# Patient Record
Sex: Male | Born: 1935 | Race: White | Hispanic: No | Smoking: Current every day smoker
Health system: Southern US, Community
[De-identification: ages and names within clinical notes are randomized; demographics above are authoritative.]

## PROBLEM LIST (undated history)

## (undated) DIAGNOSIS — F0150 Vascular dementia without behavioral disturbance: Secondary | ICD-10-CM

## (undated) DIAGNOSIS — C329 Malignant neoplasm of larynx, unspecified: Secondary | ICD-10-CM

## (undated) DIAGNOSIS — I739 Peripheral vascular disease, unspecified: Secondary | ICD-10-CM

## (undated) DIAGNOSIS — Z862 Personal history of diseases of the blood and blood-forming organs and certain disorders involving the immune mechanism: Secondary | ICD-10-CM

## (undated) DIAGNOSIS — I4891 Unspecified atrial fibrillation: Secondary | ICD-10-CM

## (undated) DIAGNOSIS — E785 Hyperlipidemia, unspecified: Secondary | ICD-10-CM

## (undated) DIAGNOSIS — I6529 Occlusion and stenosis of unspecified carotid artery: Secondary | ICD-10-CM

## (undated) DIAGNOSIS — J449 Chronic obstructive pulmonary disease, unspecified: Secondary | ICD-10-CM

## (undated) DIAGNOSIS — W009XXA Unspecified fall due to ice and snow, initial encounter: Secondary | ICD-10-CM

## (undated) DIAGNOSIS — M8580 Other specified disorders of bone density and structure, unspecified site: Secondary | ICD-10-CM

## (undated) DIAGNOSIS — Z860100 Personal history of colon polyps, unspecified: Secondary | ICD-10-CM

## (undated) DIAGNOSIS — F0390 Unspecified dementia without behavioral disturbance: Secondary | ICD-10-CM

## (undated) DIAGNOSIS — I482 Chronic atrial fibrillation, unspecified: Secondary | ICD-10-CM

## (undated) DIAGNOSIS — C320 Malignant neoplasm of glottis: Secondary | ICD-10-CM

## (undated) DIAGNOSIS — I639 Cerebral infarction, unspecified: Secondary | ICD-10-CM

## (undated) DIAGNOSIS — F17200 Nicotine dependence, unspecified, uncomplicated: Secondary | ICD-10-CM

## (undated) HISTORY — DX: Personal history of diseases of the blood and blood-forming organs and certain disorders involving the immune mechanism: Z86.2

## (undated) HISTORY — DX: Personal history of colonic polyps: Z86.010

## (undated) HISTORY — PX: OTHER SURGICAL HISTORY: SHX169

## (undated) HISTORY — DX: Hyperlipidemia, unspecified: E78.5

## (undated) HISTORY — DX: Occlusion and stenosis of unspecified carotid artery: I65.29

## (undated) HISTORY — PX: HEMORRHOID SURGERY: SHX153

## (undated) HISTORY — PX: TONSILLECTOMY AND ADENOIDECTOMY: SUR1326

## (undated) HISTORY — DX: Chronic atrial fibrillation, unspecified: I48.20

## (undated) HISTORY — DX: Chronic obstructive pulmonary disease, unspecified: J44.9

## (undated) HISTORY — PX: CATARACT EXTRACTION: SUR2

## (undated) HISTORY — DX: Unspecified dementia, unspecified severity, without behavioral disturbance, psychotic disturbance, mood disturbance, and anxiety: F03.90

## (undated) HISTORY — PX: SPLENECTOMY: SUR1306

## (undated) HISTORY — DX: Nicotine dependence, unspecified, uncomplicated: F17.200

## (undated) HISTORY — DX: Cerebral infarction, unspecified: I63.9

## (undated) HISTORY — DX: Personal history of colon polyps, unspecified: Z86.0100

## (undated) HISTORY — DX: Other specified disorders of bone density and structure, unspecified site: M85.80

## (undated) HISTORY — DX: Unspecified fall due to ice and snow, initial encounter: W00.9XXA

## (undated) HISTORY — DX: Malignant neoplasm of glottis: C32.0

---

## 1999-02-19 ENCOUNTER — Ambulatory Visit (HOSPITAL_COMMUNITY): Admission: RE | Admit: 1999-02-19 | Discharge: 1999-02-19 | Payer: Self-pay | Admitting: Gastroenterology

## 2002-12-17 ENCOUNTER — Encounter: Payer: Self-pay | Admitting: Family Medicine

## 2003-01-06 ENCOUNTER — Encounter (HOSPITAL_COMMUNITY): Payer: Self-pay | Admitting: Oncology

## 2003-01-06 ENCOUNTER — Ambulatory Visit (HOSPITAL_COMMUNITY): Admission: RE | Admit: 2003-01-06 | Discharge: 2003-01-06 | Payer: Self-pay | Admitting: Oncology

## 2003-04-11 ENCOUNTER — Encounter (INDEPENDENT_AMBULATORY_CARE_PROVIDER_SITE_OTHER): Payer: Self-pay | Admitting: Specialist

## 2003-04-11 ENCOUNTER — Ambulatory Visit (HOSPITAL_COMMUNITY): Admission: RE | Admit: 2003-04-11 | Discharge: 2003-04-11 | Payer: Self-pay | Admitting: Oncology

## 2003-05-31 HISTORY — PX: BONE MARROW BIOPSY: SHX199

## 2003-08-28 ENCOUNTER — Ambulatory Visit (HOSPITAL_COMMUNITY): Admission: RE | Admit: 2003-08-28 | Discharge: 2003-08-28 | Payer: Self-pay | Admitting: Oncology

## 2003-11-10 ENCOUNTER — Inpatient Hospital Stay (HOSPITAL_COMMUNITY): Admission: RE | Admit: 2003-11-10 | Discharge: 2003-11-12 | Payer: Self-pay | Admitting: General Surgery

## 2003-11-10 ENCOUNTER — Encounter (INDEPENDENT_AMBULATORY_CARE_PROVIDER_SITE_OTHER): Payer: Self-pay | Admitting: Specialist

## 2004-04-06 ENCOUNTER — Ambulatory Visit: Payer: Self-pay | Admitting: Family Medicine

## 2004-04-11 ENCOUNTER — Ambulatory Visit: Payer: Self-pay | Admitting: Oncology

## 2004-04-14 ENCOUNTER — Ambulatory Visit: Payer: Self-pay | Admitting: Internal Medicine

## 2004-06-08 ENCOUNTER — Ambulatory Visit: Payer: Self-pay | Admitting: Oncology

## 2004-06-30 LAB — HM DEXA SCAN

## 2004-07-09 ENCOUNTER — Encounter: Admission: RE | Admit: 2004-07-09 | Discharge: 2004-07-09 | Payer: Self-pay | Admitting: Oncology

## 2004-08-02 ENCOUNTER — Ambulatory Visit: Payer: Self-pay | Admitting: Oncology

## 2004-09-27 ENCOUNTER — Ambulatory Visit: Payer: Self-pay | Admitting: Oncology

## 2004-12-06 ENCOUNTER — Ambulatory Visit: Payer: Self-pay | Admitting: Oncology

## 2005-01-28 ENCOUNTER — Ambulatory Visit: Payer: Self-pay | Admitting: Oncology

## 2005-03-30 ENCOUNTER — Ambulatory Visit: Payer: Self-pay | Admitting: Family Medicine

## 2005-04-04 ENCOUNTER — Ambulatory Visit: Payer: Self-pay | Admitting: Oncology

## 2005-05-13 ENCOUNTER — Ambulatory Visit: Payer: Self-pay | Admitting: Internal Medicine

## 2005-06-01 ENCOUNTER — Ambulatory Visit: Payer: Self-pay | Admitting: Oncology

## 2005-07-28 ENCOUNTER — Ambulatory Visit: Payer: Self-pay | Admitting: Oncology

## 2005-09-21 ENCOUNTER — Ambulatory Visit: Payer: Self-pay | Admitting: Oncology

## 2005-09-29 ENCOUNTER — Ambulatory Visit: Payer: Self-pay | Admitting: Oncology

## 2005-09-29 LAB — CBC WITH DIFFERENTIAL/PLATELET
Basophils Absolute: 0.1 10*3/uL (ref 0.0–0.1)
Eosinophils Absolute: 0.1 10*3/uL (ref 0.0–0.5)
HGB: 14 g/dL (ref 13.0–17.1)
MCV: 93.4 fL (ref 81.6–98.0)
MONO#: 1.1 10*3/uL — ABNORMAL HIGH (ref 0.1–0.9)
MONO%: 9.7 % (ref 0.0–13.0)
NEUT#: 6.1 10*3/uL (ref 1.5–6.5)
Platelets: 196 10*3/uL (ref 145–400)
RDW: 13.9 % (ref 11.2–14.6)
WBC: 11 10*3/uL — ABNORMAL HIGH (ref 4.0–10.0)

## 2005-09-29 LAB — COMPREHENSIVE METABOLIC PANEL
Albumin: 3.8 g/dL (ref 3.5–5.2)
Alkaline Phosphatase: 59 U/L (ref 39–117)
BUN: 22 mg/dL (ref 6–23)
CO2: 24 mEq/L (ref 19–32)
Calcium: 9 mg/dL (ref 8.4–10.5)
Glucose, Bld: 103 mg/dL — ABNORMAL HIGH (ref 70–99)
Potassium: 3.9 mEq/L (ref 3.5–5.3)
Total Protein: 6.9 g/dL (ref 6.0–8.3)

## 2005-09-29 LAB — LACTATE DEHYDROGENASE: LDH: 111 U/L (ref 94–250)

## 2005-10-28 LAB — CBC WITH DIFFERENTIAL/PLATELET
Basophils Absolute: 0 10*3/uL (ref 0.0–0.1)
Eosinophils Absolute: 0 10*3/uL (ref 0.0–0.5)
HGB: 13.6 g/dL (ref 13.0–17.1)
NEUT#: 10.7 10*3/uL — ABNORMAL HIGH (ref 1.5–6.5)
RDW: 14 % (ref 11.2–14.6)
lymph#: 1.5 10*3/uL (ref 0.9–3.3)

## 2005-11-21 ENCOUNTER — Ambulatory Visit: Payer: Self-pay | Admitting: Oncology

## 2005-11-24 LAB — CBC WITH DIFFERENTIAL/PLATELET
Basophils Absolute: 0 10*3/uL (ref 0.0–0.1)
Eosinophils Absolute: 0 10*3/uL (ref 0.0–0.5)
HGB: 13.8 g/dL (ref 13.0–17.1)
LYMPH%: 16.6 % (ref 14.0–48.0)
MCV: 94.5 fL (ref 81.6–98.0)
MONO%: 5.9 % (ref 0.0–13.0)
NEUT#: 7.6 10*3/uL — ABNORMAL HIGH (ref 1.5–6.5)
Platelets: 207 10*3/uL (ref 145–400)
RDW: 13.8 % (ref 11.2–14.6)

## 2005-12-22 LAB — CBC WITH DIFFERENTIAL/PLATELET
Basophils Absolute: 0 10*3/uL (ref 0.0–0.1)
EOS%: 0.6 % (ref 0.0–7.0)
HCT: 42.7 % (ref 38.7–49.9)
HGB: 14.5 g/dL (ref 13.0–17.1)
MCH: 31.7 pg (ref 28.0–33.4)
MONO#: 0.6 10*3/uL (ref 0.1–0.9)
NEUT%: 73 % (ref 40.0–75.0)
Platelets: 70 10*3/uL — ABNORMAL LOW (ref 145–400)
lymph#: 1.7 10*3/uL (ref 0.9–3.3)

## 2005-12-22 LAB — COMPREHENSIVE METABOLIC PANEL
BUN: 13 mg/dL (ref 6–23)
CO2: 25 mEq/L (ref 19–32)
Calcium: 9.3 mg/dL (ref 8.4–10.5)
Chloride: 105 mEq/L (ref 96–112)
Creatinine, Ser: 0.76 mg/dL (ref 0.40–1.50)

## 2005-12-22 LAB — LACTATE DEHYDROGENASE: LDH: 124 U/L (ref 94–250)

## 2005-12-29 LAB — CBC WITH DIFFERENTIAL/PLATELET
BASO%: 0.2 % (ref 0.0–2.0)
EOS%: 0.1 % (ref 0.0–7.0)
Eosinophils Absolute: 0 10*3/uL (ref 0.0–0.5)
LYMPH%: 14 % (ref 14.0–48.0)
MCH: 31.9 pg (ref 28.0–33.4)
MCHC: 33.8 g/dL (ref 32.0–35.9)
MCV: 94.4 fL (ref 81.6–98.0)
MONO%: 5.3 % (ref 0.0–13.0)
Platelets: 302 10*3/uL (ref 145–400)
RBC: 4.23 10*6/uL (ref 4.20–5.71)

## 2006-01-13 ENCOUNTER — Ambulatory Visit: Payer: Self-pay | Admitting: Oncology

## 2006-01-19 LAB — CBC WITH DIFFERENTIAL/PLATELET
BASO%: 0.3 % (ref 0.0–2.0)
LYMPH%: 10.5 % — ABNORMAL LOW (ref 14.0–48.0)
MCHC: 34 g/dL (ref 32.0–35.9)
MONO#: 0.7 10*3/uL (ref 0.1–0.9)
Platelets: 300 10*3/uL (ref 145–400)
RBC: 3.89 10*6/uL — ABNORMAL LOW (ref 4.20–5.71)
RDW: 13.3 % (ref 11.2–14.6)
WBC: 11.7 10*3/uL — ABNORMAL HIGH (ref 4.0–10.0)

## 2006-01-19 LAB — COMPREHENSIVE METABOLIC PANEL
ALT: 10 U/L (ref 0–40)
Alkaline Phosphatase: 57 U/L (ref 39–117)
CO2: 25 mEq/L (ref 19–32)
Sodium: 139 mEq/L (ref 135–145)
Total Bilirubin: 0.5 mg/dL (ref 0.3–1.2)
Total Protein: 6.8 g/dL (ref 6.0–8.3)

## 2006-02-16 LAB — CBC WITH DIFFERENTIAL/PLATELET
BASO%: 0.6 % (ref 0.0–2.0)
EOS%: 1.2 % (ref 0.0–7.0)
HCT: 40.7 % (ref 38.7–49.9)
LYMPH%: 24.3 % (ref 14.0–48.0)
MCH: 31.5 pg (ref 28.0–33.4)
MCHC: 33.5 g/dL (ref 32.0–35.9)
MONO%: 8.5 % (ref 0.0–13.0)
NEUT%: 65.4 % (ref 40.0–75.0)
Platelets: 218 10*3/uL (ref 145–400)

## 2006-03-14 ENCOUNTER — Ambulatory Visit: Payer: Self-pay | Admitting: Oncology

## 2006-03-16 LAB — CBC WITH DIFFERENTIAL/PLATELET
Basophils Absolute: 0 10*3/uL (ref 0.0–0.1)
EOS%: 1.7 % (ref 0.0–7.0)
Eosinophils Absolute: 0.2 10*3/uL (ref 0.0–0.5)
HGB: 13 g/dL (ref 13.0–17.1)
LYMPH%: 26.7 % (ref 14.0–48.0)
MCH: 30.9 pg (ref 28.0–33.4)
MCV: 93.3 fL (ref 81.6–98.0)
MONO%: 8 % (ref 0.0–13.0)
NEUT#: 6.4 10*3/uL (ref 1.5–6.5)
NEUT%: 63.2 % (ref 40.0–75.0)
Platelets: 347 10*3/uL (ref 145–400)

## 2006-04-13 LAB — CBC WITH DIFFERENTIAL/PLATELET
BASO%: 0.5 % (ref 0.0–2.0)
Basophils Absolute: 0.1 10*3/uL (ref 0.0–0.1)
EOS%: 4.3 % (ref 0.0–7.0)
HCT: 41.3 % (ref 38.7–49.9)
LYMPH%: 32 % (ref 14.0–48.0)
MCH: 31 pg (ref 28.0–33.4)
MCHC: 33.5 g/dL (ref 32.0–35.9)
MCV: 92.4 fL (ref 81.6–98.0)
MONO%: 7.3 % (ref 0.0–13.0)
NEUT%: 55.9 % (ref 40.0–75.0)
Platelets: 200 10*3/uL (ref 145–400)

## 2006-04-19 ENCOUNTER — Ambulatory Visit: Payer: Self-pay | Admitting: Family Medicine

## 2006-05-09 ENCOUNTER — Ambulatory Visit: Payer: Self-pay | Admitting: Oncology

## 2006-05-11 LAB — CBC WITH DIFFERENTIAL/PLATELET
EOS%: 8.9 % — ABNORMAL HIGH (ref 0.0–7.0)
Eosinophils Absolute: 0.8 10*3/uL — ABNORMAL HIGH (ref 0.0–0.5)
HCT: 40.4 % (ref 38.7–49.9)
HGB: 13.5 g/dL (ref 13.0–17.1)
MCHC: 33.5 g/dL (ref 32.0–35.9)
MONO#: 1 10*3/uL — ABNORMAL HIGH (ref 0.1–0.9)
MONO%: 10.8 % (ref 0.0–13.0)
Platelets: 25 10*3/uL — ABNORMAL LOW (ref 145–400)
WBC: 9.4 10*3/uL (ref 4.0–10.0)
lymph#: 3 10*3/uL (ref 0.9–3.3)

## 2006-05-12 LAB — CBC WITH DIFFERENTIAL/PLATELET
Eosinophils Absolute: 1 10*3/uL — ABNORMAL HIGH (ref 0.0–0.5)
MONO#: 1 10*3/uL — ABNORMAL HIGH (ref 0.1–0.9)
MONO%: 9.5 % (ref 0.0–13.0)
NEUT#: 5 10*3/uL (ref 1.5–6.5)
RBC: 4.34 10*6/uL (ref 4.20–5.71)
RDW: 14.6 % (ref 11.2–14.6)
WBC: 10.6 10*3/uL — ABNORMAL HIGH (ref 4.0–10.0)

## 2006-05-17 LAB — CBC WITH DIFFERENTIAL/PLATELET
Eosinophils Absolute: 0.4 10*3/uL (ref 0.0–0.5)
HCT: 41.1 % (ref 38.7–49.9)
LYMPH%: 33.7 % (ref 14.0–48.0)
MONO#: 0.9 10*3/uL (ref 0.1–0.9)
NEUT#: 5.8 10*3/uL (ref 1.5–6.5)
Platelets: 226 10*3/uL (ref 145–400)
RBC: 4.42 10*6/uL (ref 4.20–5.71)
WBC: 10.9 10*3/uL — ABNORMAL HIGH (ref 4.0–10.0)

## 2006-05-24 LAB — CBC WITH DIFFERENTIAL/PLATELET
BASO%: 0.5 % (ref 0.0–2.0)
Basophils Absolute: 0.1 10*3/uL (ref 0.0–0.1)
Eosinophils Absolute: 0.7 10*3/uL — ABNORMAL HIGH (ref 0.0–0.5)
HCT: 40.5 % (ref 38.7–49.9)
HGB: 13.4 g/dL (ref 13.0–17.1)
LYMPH%: 33.3 % (ref 14.0–48.0)
MCHC: 33.2 g/dL (ref 32.0–35.9)
MONO#: 1.1 10*3/uL — ABNORMAL HIGH (ref 0.1–0.9)
NEUT%: 50.8 % (ref 40.0–75.0)
Platelets: 191 10*3/uL (ref 145–400)
WBC: 11.8 10*3/uL — ABNORMAL HIGH (ref 4.0–10.0)

## 2006-05-30 DIAGNOSIS — C320 Malignant neoplasm of glottis: Secondary | ICD-10-CM | POA: Diagnosis present

## 2006-05-30 HISTORY — DX: Malignant neoplasm of glottis: C32.0

## 2006-06-08 LAB — CBC WITH DIFFERENTIAL/PLATELET
BASO%: 0.7 % (ref 0.0–2.0)
Eosinophils Absolute: 0.3 10*3/uL (ref 0.0–0.5)
MCHC: 32 g/dL (ref 32.0–35.9)
MONO#: 1 10*3/uL — ABNORMAL HIGH (ref 0.1–0.9)
NEUT#: 5.2 10*3/uL (ref 1.5–6.5)
Platelets: 179 10*3/uL (ref 145–400)
RBC: 4.49 10*6/uL (ref 4.20–5.71)
RDW: 14.4 % (ref 11.2–14.6)
WBC: 10.1 10*3/uL — ABNORMAL HIGH (ref 4.0–10.0)
lymph#: 3.5 10*3/uL — ABNORMAL HIGH (ref 0.9–3.3)

## 2006-07-04 ENCOUNTER — Ambulatory Visit: Payer: Self-pay | Admitting: Oncology

## 2006-07-06 LAB — CBC WITH DIFFERENTIAL/PLATELET
BASO%: 0.6 % (ref 0.0–2.0)
Eosinophils Absolute: 0.3 10*3/uL (ref 0.0–0.5)
HCT: 40.5 % (ref 38.7–49.9)
HGB: 13.8 g/dL (ref 13.0–17.1)
LYMPH%: 37.7 % (ref 14.0–48.0)
MCHC: 34.2 g/dL (ref 32.0–35.9)
MONO#: 1.2 10*3/uL — ABNORMAL HIGH (ref 0.1–0.9)
NEUT#: 5.2 10*3/uL (ref 1.5–6.5)
NEUT%: 48 % (ref 40.0–75.0)
Platelets: 143 10*3/uL — ABNORMAL LOW (ref 145–400)
WBC: 10.9 10*3/uL — ABNORMAL HIGH (ref 4.0–10.0)
lymph#: 4.1 10*3/uL — ABNORMAL HIGH (ref 0.9–3.3)

## 2006-08-03 LAB — CBC WITH DIFFERENTIAL/PLATELET
BASO%: 0.6 % (ref 0.0–2.0)
Basophils Absolute: 0.1 10*3/uL (ref 0.0–0.1)
HCT: 41.3 % (ref 38.7–49.9)
HGB: 14.2 g/dL (ref 13.0–17.1)
LYMPH%: 33.3 % (ref 14.0–48.0)
MCH: 31.4 pg (ref 28.0–33.4)
MCHC: 34.4 g/dL (ref 32.0–35.9)
MONO#: 1.2 10*3/uL — ABNORMAL HIGH (ref 0.1–0.9)
NEUT%: 52.5 % (ref 40.0–75.0)
Platelets: 130 10*3/uL — ABNORMAL LOW (ref 145–400)
WBC: 10.7 10*3/uL — ABNORMAL HIGH (ref 4.0–10.0)
lymph#: 3.6 10*3/uL — ABNORMAL HIGH (ref 0.9–3.3)

## 2006-08-15 ENCOUNTER — Ambulatory Visit: Payer: Self-pay | Admitting: Family Medicine

## 2006-08-28 ENCOUNTER — Ambulatory Visit: Payer: Self-pay | Admitting: Oncology

## 2006-08-31 LAB — CBC WITH DIFFERENTIAL/PLATELET
BASO%: 0.7 % (ref 0.0–2.0)
EOS%: 1.5 % (ref 0.0–7.0)
HCT: 39.2 % (ref 38.7–49.9)
LYMPH%: 32.2 % (ref 14.0–48.0)
MCH: 32.5 pg (ref 28.0–33.4)
MCHC: 35 g/dL (ref 32.0–35.9)
NEUT%: 57.2 % (ref 40.0–75.0)
lymph#: 4.1 10*3/uL — ABNORMAL HIGH (ref 0.9–3.3)

## 2006-09-28 ENCOUNTER — Encounter: Payer: Self-pay | Admitting: Family Medicine

## 2006-09-28 LAB — COMPREHENSIVE METABOLIC PANEL
ALT: 10 U/L (ref 0–53)
AST: 13 U/L (ref 0–37)
Albumin: 3.9 g/dL (ref 3.5–5.2)
Calcium: 9.2 mg/dL (ref 8.4–10.5)
Chloride: 105 mEq/L (ref 96–112)
Potassium: 4.4 mEq/L (ref 3.5–5.3)
Sodium: 140 mEq/L (ref 135–145)

## 2006-09-28 LAB — CBC WITH DIFFERENTIAL/PLATELET
BASO%: 1.1 % (ref 0.0–2.0)
EOS%: 1.9 % (ref 0.0–7.0)
HGB: 14.4 g/dL (ref 13.0–17.1)
MCH: 32.5 pg (ref 28.0–33.4)
MCHC: 35.2 g/dL (ref 32.0–35.9)
RDW: 14 % (ref 11.2–14.6)
lymph#: 2.9 10*3/uL (ref 0.9–3.3)

## 2006-11-06 ENCOUNTER — Encounter: Payer: Self-pay | Admitting: Family Medicine

## 2006-11-06 DIAGNOSIS — Z860100 Personal history of colon polyps, unspecified: Secondary | ICD-10-CM | POA: Insufficient documentation

## 2006-11-06 DIAGNOSIS — D696 Thrombocytopenia, unspecified: Secondary | ICD-10-CM | POA: Insufficient documentation

## 2006-11-06 DIAGNOSIS — F17200 Nicotine dependence, unspecified, uncomplicated: Secondary | ICD-10-CM | POA: Insufficient documentation

## 2006-11-06 DIAGNOSIS — J449 Chronic obstructive pulmonary disease, unspecified: Secondary | ICD-10-CM | POA: Insufficient documentation

## 2006-11-06 DIAGNOSIS — D693 Immune thrombocytopenic purpura: Secondary | ICD-10-CM | POA: Insufficient documentation

## 2006-11-06 DIAGNOSIS — E785 Hyperlipidemia, unspecified: Secondary | ICD-10-CM | POA: Insufficient documentation

## 2006-11-06 DIAGNOSIS — Z87448 Personal history of other diseases of urinary system: Secondary | ICD-10-CM | POA: Insufficient documentation

## 2006-11-08 ENCOUNTER — Ambulatory Visit: Payer: Self-pay | Admitting: Family Medicine

## 2006-11-10 ENCOUNTER — Encounter (INDEPENDENT_AMBULATORY_CARE_PROVIDER_SITE_OTHER): Payer: Self-pay | Admitting: *Deleted

## 2006-11-10 LAB — CONVERTED CEMR LAB
AST: 24 units/L (ref 0–37)
CO2: 27 meq/L (ref 19–32)
Calcium: 9.3 mg/dL (ref 8.4–10.5)
Cholesterol: 142 mg/dL (ref 0–200)
Creatinine, Ser: 0.7 mg/dL (ref 0.4–1.5)
Glucose, Bld: 79 mg/dL (ref 70–99)
HDL: 28.2 mg/dL — ABNORMAL LOW (ref >39.0)
LDL Cholesterol: 103 mg/dL — ABNORMAL HIGH (ref 0–99)
Phosphorus: 3.1 mg/dL (ref 2.3–4.6)
Potassium: 4.5 meq/L (ref 3.5–5.1)
Total CHOL/HDL Ratio: 5

## 2006-11-20 ENCOUNTER — Ambulatory Visit: Payer: Self-pay | Admitting: Oncology

## 2006-11-23 LAB — CBC WITH DIFFERENTIAL/PLATELET
Basophils Absolute: 0.1 10*3/uL (ref 0.0–0.1)
EOS%: 2 % (ref 0.0–7.0)
LYMPH%: 35.1 % (ref 14.0–48.0)
MCH: 31.9 pg (ref 28.0–33.4)
MCV: 93.5 fL (ref 81.6–98.0)
MONO%: 10.7 % (ref 0.0–13.0)
RBC: 4.42 10*6/uL (ref 4.20–5.71)
RDW: 13.5 % (ref 11.2–14.6)

## 2007-01-14 ENCOUNTER — Ambulatory Visit: Payer: Self-pay | Admitting: Oncology

## 2007-01-18 LAB — CBC WITH DIFFERENTIAL/PLATELET
Basophils Absolute: 0 10*3/uL (ref 0.0–0.1)
EOS%: 1.1 % (ref 0.0–7.0)
Eosinophils Absolute: 0.1 10*3/uL (ref 0.0–0.5)
LYMPH%: 29.9 % (ref 14.0–48.0)
MCH: 32.4 pg (ref 28.0–33.4)
MCV: 92.9 fL (ref 81.6–98.0)
MONO%: 9.6 % (ref 0.0–13.0)
NEUT%: 59.1 % (ref 40.0–75.0)
Platelets: 135 10*3/uL — ABNORMAL LOW (ref 145–400)
RDW: 13.1 % (ref 11.2–14.6)
WBC: 10.5 10*3/uL — ABNORMAL HIGH (ref 4.0–10.0)

## 2007-01-29 DIAGNOSIS — C321 Malignant neoplasm of supraglottis: Secondary | ICD-10-CM | POA: Insufficient documentation

## 2007-02-05 ENCOUNTER — Telehealth: Payer: Self-pay | Admitting: Family Medicine

## 2007-02-05 DIAGNOSIS — R498 Other voice and resonance disorders: Secondary | ICD-10-CM | POA: Insufficient documentation

## 2007-02-05 DIAGNOSIS — R490 Dysphonia: Secondary | ICD-10-CM | POA: Insufficient documentation

## 2007-02-12 ENCOUNTER — Encounter: Admission: RE | Admit: 2007-02-12 | Discharge: 2007-02-12 | Payer: Self-pay | Admitting: Otolaryngology

## 2007-02-13 ENCOUNTER — Encounter: Payer: Self-pay | Admitting: Family Medicine

## 2007-02-13 ENCOUNTER — Encounter (INDEPENDENT_AMBULATORY_CARE_PROVIDER_SITE_OTHER): Payer: Self-pay | Admitting: Otolaryngology

## 2007-02-13 ENCOUNTER — Encounter (INDEPENDENT_AMBULATORY_CARE_PROVIDER_SITE_OTHER): Payer: Self-pay | Admitting: *Deleted

## 2007-02-13 ENCOUNTER — Ambulatory Visit (HOSPITAL_BASED_OUTPATIENT_CLINIC_OR_DEPARTMENT_OTHER): Admission: RE | Admit: 2007-02-13 | Discharge: 2007-02-13 | Payer: Self-pay | Admitting: Otolaryngology

## 2007-02-27 ENCOUNTER — Ambulatory Visit: Admission: RE | Admit: 2007-02-27 | Discharge: 2007-05-13 | Payer: Self-pay | Admitting: Radiation Oncology

## 2007-02-28 ENCOUNTER — Encounter: Payer: Self-pay | Admitting: Family Medicine

## 2007-03-13 ENCOUNTER — Ambulatory Visit: Payer: Self-pay | Admitting: Oncology

## 2007-03-15 ENCOUNTER — Encounter: Payer: Self-pay | Admitting: Family Medicine

## 2007-03-15 LAB — COMPREHENSIVE METABOLIC PANEL
AST: 15 U/L (ref 0–37)
Albumin: 3.9 g/dL (ref 3.5–5.2)
Alkaline Phosphatase: 61 U/L (ref 39–117)
Glucose, Bld: 84 mg/dL (ref 70–99)
Potassium: 4.2 mEq/L (ref 3.5–5.3)
Sodium: 141 mEq/L (ref 135–145)
Total Bilirubin: 0.5 mg/dL (ref 0.3–1.2)
Total Protein: 6.9 g/dL (ref 6.0–8.3)

## 2007-03-15 LAB — CBC WITH DIFFERENTIAL/PLATELET
EOS%: 1.9 % (ref 0.0–7.0)
Eosinophils Absolute: 0.2 10*3/uL (ref 0.0–0.5)
LYMPH%: 36.4 % (ref 14.0–48.0)
MCH: 32.2 pg (ref 28.0–33.4)
MCHC: 34.7 g/dL (ref 32.0–35.9)
MCV: 92.8 fL (ref 81.6–98.0)
MONO%: 9.4 % (ref 0.0–13.0)
Platelets: 124 10*3/uL — ABNORMAL LOW (ref 145–400)
RBC: 4.2 10*6/uL (ref 4.20–5.71)
RDW: 13.2 % (ref 11.2–14.6)

## 2007-04-06 ENCOUNTER — Ambulatory Visit: Payer: Self-pay | Admitting: Family Medicine

## 2007-04-25 ENCOUNTER — Encounter: Payer: Self-pay | Admitting: Family Medicine

## 2007-05-11 ENCOUNTER — Ambulatory Visit: Payer: Self-pay | Admitting: Oncology

## 2007-05-15 LAB — CBC WITH DIFFERENTIAL/PLATELET
Eosinophils Absolute: 0 10*3/uL (ref 0.0–0.5)
HCT: 35.7 % — ABNORMAL LOW (ref 38.7–49.9)
LYMPH%: 4 % — ABNORMAL LOW (ref 14.0–48.0)
MCHC: 34.1 g/dL (ref 32.0–35.9)
MCV: 93.6 fL (ref 81.6–98.0)
MONO#: 0.9 10*3/uL (ref 0.1–0.9)
MONO%: 5.6 % (ref 0.0–13.0)
NEUT#: 14 10*3/uL — ABNORMAL HIGH (ref 1.5–6.5)
NEUT%: 89.3 % — ABNORMAL HIGH (ref 40.0–75.0)
Platelets: 163 10*3/uL (ref 145–400)
WBC: 15.7 10*3/uL — ABNORMAL HIGH (ref 4.0–10.0)

## 2007-05-22 ENCOUNTER — Encounter: Payer: Self-pay | Admitting: Family Medicine

## 2007-07-12 ENCOUNTER — Ambulatory Visit: Payer: Self-pay | Admitting: Oncology

## 2007-07-16 LAB — CBC WITH DIFFERENTIAL/PLATELET
Basophils Absolute: 0 10*3/uL (ref 0.0–0.1)
EOS%: 2.9 % (ref 0.0–7.0)
HGB: 13.2 g/dL (ref 13.0–17.1)
MCH: 31.3 pg (ref 28.0–33.4)
MCHC: 34 g/dL (ref 32.0–35.9)
MCV: 92.1 fL (ref 81.6–98.0)
MONO%: 11.4 % (ref 0.0–13.0)
NEUT%: 56.6 % (ref 40.0–75.0)
RDW: 13.8 % (ref 11.2–14.6)

## 2007-08-22 ENCOUNTER — Encounter: Payer: Self-pay | Admitting: Family Medicine

## 2007-09-11 ENCOUNTER — Ambulatory Visit: Payer: Self-pay | Admitting: Oncology

## 2007-09-13 ENCOUNTER — Encounter: Payer: Self-pay | Admitting: Family Medicine

## 2007-09-13 LAB — COMPREHENSIVE METABOLIC PANEL
ALT: 9 U/L (ref 0–53)
AST: 14 U/L (ref 0–37)
Calcium: 8.8 mg/dL (ref 8.4–10.5)
Chloride: 104 mEq/L (ref 96–112)
Creatinine, Ser: 0.82 mg/dL (ref 0.40–1.50)
Potassium: 4 mEq/L (ref 3.5–5.3)
Sodium: 138 mEq/L (ref 135–145)
Total Protein: 7.2 g/dL (ref 6.0–8.3)

## 2007-09-13 LAB — CBC WITH DIFFERENTIAL/PLATELET
BASO%: 0.1 % (ref 0.0–2.0)
EOS%: 1.9 % (ref 0.0–7.0)
MCH: 31.1 pg (ref 28.0–33.4)
MCHC: 33.9 g/dL (ref 32.0–35.9)
MONO#: 0.8 10*3/uL (ref 0.1–0.9)
NEUT%: 62.9 % (ref 40.0–75.0)
RBC: 4.63 10*6/uL (ref 4.20–5.71)
RDW: 13.8 % (ref 11.2–14.6)
WBC: 7.8 10*3/uL (ref 4.0–10.0)
lymph#: 1.9 10*3/uL (ref 0.9–3.3)

## 2007-12-06 ENCOUNTER — Ambulatory Visit: Payer: Self-pay | Admitting: Family Medicine

## 2007-12-06 ENCOUNTER — Telehealth: Payer: Self-pay | Admitting: Family Medicine

## 2008-01-02 ENCOUNTER — Encounter: Payer: Self-pay | Admitting: Family Medicine

## 2008-02-08 ENCOUNTER — Ambulatory Visit: Payer: Self-pay | Admitting: Family Medicine

## 2008-02-11 LAB — CONVERTED CEMR LAB
ALT: 14 units/L (ref 0–53)
AST: 21 units/L (ref 0–37)
Albumin: 3.5 g/dL (ref 3.5–5.2)
Alkaline Phosphatase: 49 units/L (ref 39–117)
BUN: 16 mg/dL (ref 6–23)
Basophils Absolute: 0 10*3/uL (ref 0.0–0.1)
Basophils Relative: 0.4 % (ref 0.0–3.0)
Bilirubin, Direct: 0.1 mg/dL (ref 0.0–0.3)
CO2: 28 meq/L (ref 19–32)
Chloride: 106 meq/L (ref 96–112)
Eosinophils Relative: 1.3 % (ref 0.0–5.0)
Glucose, Bld: 99 mg/dL (ref 70–99)
Hemoglobin: 12.7 g/dL — ABNORMAL LOW (ref 13.0–17.0)
Lymphocytes Relative: 21.5 % (ref 12.0–46.0)
MCHC: 34.7 g/dL (ref 30.0–36.0)
Monocytes Relative: 9.2 % (ref 3.0–12.0)
Neutro Abs: 5.2 10*3/uL (ref 1.4–7.7)
Potassium: 4.1 meq/L (ref 3.5–5.1)
RBC: 3.85 M/uL — ABNORMAL LOW (ref 4.22–5.81)
Sodium: 138 meq/L (ref 135–145)
Total Protein: 7 g/dL (ref 6.0–8.3)
WBC: 7.6 10*3/uL (ref 4.5–10.5)

## 2008-03-05 ENCOUNTER — Encounter: Payer: Self-pay | Admitting: Family Medicine

## 2008-03-11 ENCOUNTER — Ambulatory Visit: Payer: Self-pay | Admitting: Oncology

## 2008-03-13 ENCOUNTER — Encounter: Payer: Self-pay | Admitting: Family Medicine

## 2008-03-13 LAB — CBC WITH DIFFERENTIAL/PLATELET
Eosinophils Absolute: 0.1 10*3/uL (ref 0.0–0.5)
HCT: 40.2 % (ref 38.7–49.9)
LYMPH%: 23.6 % (ref 14.0–48.0)
MONO#: 0.6 10*3/uL (ref 0.1–0.9)
NEUT#: 5.9 10*3/uL (ref 1.5–6.5)
NEUT%: 67.3 % (ref 40.0–75.0)
Platelets: 231 10*3/uL (ref 145–400)
WBC: 8.8 10*3/uL (ref 4.0–10.0)
lymph#: 2.1 10*3/uL (ref 0.9–3.3)

## 2008-03-13 LAB — COMPREHENSIVE METABOLIC PANEL
ALT: 10 U/L (ref 0–53)
Albumin: 4 g/dL (ref 3.5–5.2)
CO2: 22 mEq/L (ref 19–32)
Calcium: 9.2 mg/dL (ref 8.4–10.5)
Chloride: 104 mEq/L (ref 96–112)
Glucose, Bld: 108 mg/dL — ABNORMAL HIGH (ref 70–99)
Sodium: 138 mEq/L (ref 135–145)
Total Protein: 7.5 g/dL (ref 6.0–8.3)

## 2008-03-13 LAB — LACTATE DEHYDROGENASE: LDH: 124 U/L (ref 94–250)

## 2008-03-27 ENCOUNTER — Ambulatory Visit: Payer: Self-pay | Admitting: Family Medicine

## 2008-05-07 ENCOUNTER — Ambulatory Visit: Payer: Self-pay | Admitting: Family Medicine

## 2008-05-08 LAB — CONVERTED CEMR LAB
LDL Cholesterol: 117 mg/dL — ABNORMAL HIGH (ref 0–99)
Total CHOL/HDL Ratio: 5.7
VLDL: 13 mg/dL (ref 0–40)

## 2008-06-02 ENCOUNTER — Ambulatory Visit: Payer: Self-pay | Admitting: Gastroenterology

## 2008-06-10 ENCOUNTER — Ambulatory Visit: Payer: Self-pay | Admitting: Gastroenterology

## 2008-06-10 ENCOUNTER — Encounter: Payer: Self-pay | Admitting: Gastroenterology

## 2008-06-10 LAB — HM COLONOSCOPY

## 2008-06-12 ENCOUNTER — Encounter: Payer: Self-pay | Admitting: Gastroenterology

## 2008-07-11 ENCOUNTER — Encounter: Admission: RE | Admit: 2008-07-11 | Discharge: 2008-07-11 | Payer: Self-pay | Admitting: Otolaryngology

## 2008-07-15 ENCOUNTER — Ambulatory Visit (HOSPITAL_BASED_OUTPATIENT_CLINIC_OR_DEPARTMENT_OTHER): Admission: RE | Admit: 2008-07-15 | Discharge: 2008-07-15 | Payer: Self-pay | Admitting: Otolaryngology

## 2008-07-15 ENCOUNTER — Encounter (INDEPENDENT_AMBULATORY_CARE_PROVIDER_SITE_OTHER): Payer: Self-pay | Admitting: Otolaryngology

## 2008-07-24 ENCOUNTER — Encounter: Admission: RE | Admit: 2008-07-24 | Discharge: 2008-07-24 | Payer: Self-pay | Admitting: Otolaryngology

## 2008-08-19 ENCOUNTER — Telehealth: Payer: Self-pay | Admitting: Family Medicine

## 2008-09-09 ENCOUNTER — Ambulatory Visit: Payer: Self-pay | Admitting: Oncology

## 2008-09-11 ENCOUNTER — Encounter: Payer: Self-pay | Admitting: Family Medicine

## 2008-09-11 LAB — CBC WITH DIFFERENTIAL/PLATELET
BASO%: 0.5 % (ref 0.0–2.0)
Basophils Absolute: 0 10*3/uL (ref 0.0–0.1)
Eosinophils Absolute: 0.2 10*3/uL (ref 0.0–0.5)
HCT: 38.7 % (ref 38.4–49.9)
HGB: 13.3 g/dL (ref 13.0–17.1)
LYMPH%: 29.5 % (ref 14.0–49.0)
MCV: 91.3 fL (ref 79.3–98.0)
MONO%: 11.9 % (ref 0.0–14.0)
NEUT#: 4.1 10*3/uL (ref 1.5–6.5)
NEUT%: 56 % (ref 39.0–75.0)
Platelets: 173 10*3/uL (ref 140–400)
RBC: 4.24 10*6/uL (ref 4.20–5.82)
WBC: 7.3 10*3/uL (ref 4.0–10.3)
lymph#: 2.2 10*3/uL (ref 0.9–3.3)

## 2008-09-11 LAB — COMPREHENSIVE METABOLIC PANEL
Alkaline Phosphatase: 56 U/L (ref 39–117)
BUN: 20 mg/dL (ref 6–23)
Creatinine, Ser: 0.78 mg/dL (ref 0.40–1.50)
Glucose, Bld: 115 mg/dL — ABNORMAL HIGH (ref 70–99)
Total Bilirubin: 0.3 mg/dL (ref 0.3–1.2)

## 2008-09-11 LAB — LACTATE DEHYDROGENASE: LDH: 90 U/L — ABNORMAL LOW (ref 94–250)

## 2009-01-30 ENCOUNTER — Ambulatory Visit: Payer: Self-pay | Admitting: Family Medicine

## 2009-03-11 ENCOUNTER — Ambulatory Visit: Payer: Self-pay | Admitting: Oncology

## 2009-03-13 LAB — CBC WITH DIFFERENTIAL/PLATELET
Basophils Absolute: 0 10*3/uL (ref 0.0–0.1)
Eosinophils Absolute: 0.2 10*3/uL (ref 0.0–0.5)
HGB: 12.6 g/dL — ABNORMAL LOW (ref 13.0–17.1)
MCV: 94.9 fL (ref 79.3–98.0)
MONO%: 10 % (ref 0.0–14.0)
NEUT#: 6.1 10*3/uL (ref 1.5–6.5)
RDW: 14.1 % (ref 11.0–14.6)

## 2009-05-08 ENCOUNTER — Ambulatory Visit: Payer: Self-pay | Admitting: Family Medicine

## 2009-05-11 ENCOUNTER — Encounter: Admission: RE | Admit: 2009-05-11 | Discharge: 2009-05-11 | Payer: Self-pay | Admitting: Otolaryngology

## 2009-05-11 LAB — CONVERTED CEMR LAB
ALT: 14 units/L (ref 0–53)
AST: 21 units/L (ref 0–37)
Alkaline Phosphatase: 61 units/L (ref 39–117)
BUN: 15 mg/dL (ref 6–23)
CO2: 29 meq/L (ref 19–32)
Calcium: 9.3 mg/dL (ref 8.4–10.5)
Chloride: 104 meq/L (ref 96–112)
Creatinine, Ser: 0.8 mg/dL (ref 0.4–1.5)
GFR calc non Af Amer: 100 mL/min (ref >60)
HDL: 33 mg/dL — ABNORMAL LOW (ref >39.00)
PSA: 0.51 ng/mL (ref 0.10–4.00)
TSH: 1.06 microintl units/mL (ref 0.35–5.50)
Total Bilirubin: 0.6 mg/dL (ref 0.3–1.2)
Total CHOL/HDL Ratio: 4
Triglycerides: 64 mg/dL (ref 0.0–149.0)

## 2009-09-09 ENCOUNTER — Ambulatory Visit: Payer: Self-pay | Admitting: Oncology

## 2009-09-11 ENCOUNTER — Encounter: Payer: Self-pay | Admitting: Family Medicine

## 2009-09-11 LAB — COMPREHENSIVE METABOLIC PANEL
ALT: 10 U/L (ref 0–53)
Alkaline Phosphatase: 63 U/L (ref 39–117)
CO2: 24 mEq/L (ref 19–32)
Creatinine, Ser: 0.77 mg/dL (ref 0.40–1.50)
Glucose, Bld: 101 mg/dL — ABNORMAL HIGH (ref 70–99)
Total Bilirubin: 0.3 mg/dL (ref 0.3–1.2)

## 2009-09-11 LAB — CBC WITH DIFFERENTIAL/PLATELET
BASO%: 0.5 % (ref 0.0–2.0)
HCT: 41.2 % (ref 38.4–49.9)
LYMPH%: 30.1 % (ref 14.0–49.0)
MCHC: 33.7 g/dL (ref 32.0–36.0)
MCV: 96.7 fL (ref 79.3–98.0)
MONO#: 1 10*3/uL — ABNORMAL HIGH (ref 0.1–0.9)
MONO%: 10.7 % (ref 0.0–14.0)
NEUT%: 56.7 % (ref 39.0–75.0)
Platelets: 158 10*3/uL (ref 140–400)
WBC: 9.2 10*3/uL (ref 4.0–10.3)

## 2009-09-11 LAB — LACTATE DEHYDROGENASE: LDH: 106 U/L (ref 94–250)

## 2009-11-23 ENCOUNTER — Encounter: Admission: RE | Admit: 2009-11-23 | Discharge: 2009-11-23 | Payer: Self-pay | Admitting: Otolaryngology

## 2010-03-09 ENCOUNTER — Ambulatory Visit: Payer: Self-pay | Admitting: Family Medicine

## 2010-05-05 ENCOUNTER — Encounter
Admission: RE | Admit: 2010-05-05 | Discharge: 2010-05-05 | Payer: Self-pay | Source: Home / Self Care | Admitting: Otolaryngology

## 2010-05-11 ENCOUNTER — Ambulatory Visit: Payer: Self-pay | Admitting: Family Medicine

## 2010-05-13 LAB — CONVERTED CEMR LAB
ALT: 15 units/L (ref 0–53)
AST: 21 units/L (ref 0–37)
Alkaline Phosphatase: 62 units/L (ref 39–117)
Basophils Relative: 0.7 % (ref 0.0–3.0)
Bilirubin, Direct: 0.1 mg/dL (ref 0.0–0.3)
Creatinine, Ser: 0.9 mg/dL (ref 0.4–1.5)
Eosinophils Relative: 2 % (ref 0.0–5.0)
GFR calc non Af Amer: 90.53 mL/min (ref >60.00)
Glucose, Bld: 88 mg/dL (ref 70–99)
HDL: 27 mg/dL — ABNORMAL LOW (ref >39.00)
Lymphocytes Relative: 35.2 % (ref 12.0–46.0)
Monocytes Absolute: 0.9 10*3/uL (ref 0.1–1.0)
Monocytes Relative: 9.8 % (ref 3.0–12.0)
Neutrophils Relative %: 52.3 % (ref 43.0–77.0)
Platelets: 132 10*3/uL — ABNORMAL LOW (ref 150.0–400.0)
Potassium: 4.7 meq/L (ref 3.5–5.1)
RBC: 4.1 M/uL — ABNORMAL LOW (ref 4.22–5.81)
Sodium: 138 meq/L (ref 135–145)
Total Bilirubin: 0.5 mg/dL (ref 0.3–1.2)
Total CHOL/HDL Ratio: 7
VLDL: 24.4 mg/dL (ref 0.0–40.0)
WBC: 9.3 10*3/uL (ref 4.5–10.5)

## 2010-07-01 NOTE — Letter (Signed)
[facility]   Imported By: Lanelle Bal 09/17/2009 08:47:42  _____________________________________________________________________  External Attachment:    Type:   Image     Comment:   External Document

## 2010-07-01 NOTE — Assessment & Plan Note (Signed)
Summary: CPX/CLE   Vital Signs:  Patient profile:   77 year old male Height:      67 inches Weight:      131.75 pounds BMI:     20.71 Temp:     97.4 degrees F oral Pulse rate:   76 / minute Pulse rhythm:   regular BP sitting:   122 / 76  (left arm)  Vitals Entered By: Lewanda Rife LPN (May 12, 2010 9:48 AM) CC: CPX   History of Present Illness: here for f/u of chronic health problems   is feeling ok -- and no new problems  is eating like a horse    wt is up 5 lb with bmi of 20.7  bp good at 122/76  lipids are due  f/u with heme for ITP was good--goes back every 6 months  had splenectomy  colonosc 1/10-- re check 7 years   tab status -- has cut down to 1/2 ppd -- still trying to wean it down    flu shot and pneumovax utd ? 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 Eyes:  Denies blurring and eye irritation. CV:  Denies chest pain or discomfort, lightheadness, palpitations, and shortness of breath with exertion. Resp:  Denies cough, shortness of breath, and wheezing. GI:  Denies abdominal pain, bloody stools, change in bowel habits, indigestion, nausea, and vomiting. GU:  Denies hematuria, urinary frequency, and urinary hesitancy. MS:  Denies mid back pain, cramps, and muscle weakness. Derm:  Denies itching, lesion(s), poor wound healing, and rash. Neuro:  Denies numbness and tingling. Psych:  Denies anxiety, depression, easily tearful, and irritability. Endo:  Denies excessive thirst and excessive urination. Heme:  Denies abnormal bruising and bleeding.  Physical Exam  General:  Well-developed,well-nourished,in no acute distress; alert,appropriate and cooperative throughout examination Head:  normocephalic, atraumatic, and no abnormalities observed.   Eyes:  vision grossly intact, pupils equal, pupils round, and pupils reactive to light.  no conjunctival pallor, injection or icterus  Ears:  R ear normal and L ear normal.   Nose:  no nasal discharge.   Mouth:  pharynx pink and moist.   Neck:  supple with full rom and no masses or thyromegally, no JVD or carotid bruit  Chest Wall:  No deformities, masses, tenderness or gynecomastia noted. Lungs:  CTA with good air exch - bs are diffusely distant  Heart:  Normal rate and  regular rhythm. S1 and S2 normal without gallop, murmur, click, rub or other extra sounds. Abdomen:  Bowel sounds positive,abdomen soft and non-tender without masses, organomegaly or hernias noted. no renal bruits  Rectal:  No external abnormalities noted. Normal sphincter tone. No rectal masses or tenderness. Prostate:  Prostate gland firm and smooth, no enlargement, nodularity, tenderness, mass, asymmetry or induration. Msk:  No deformity or scoliosis noted of thoracic or lumbar spine.   no acute joint changes  no kyphosis   Pulses:  R and L carotid,radial,femoral,dorsalis pedis and posterior tibial pulses are full and equal bilaterally Extremities:  No clubbing, cyanosis, edema, or deformity noted with normal full range of motion of all joints.   Neurologic:  sensation intact to light touch, gait normal, and DTRs symmetrical and normal.   Skin:  Intact without suspicious lesions or rashes no jaundice or pallor Cervical Nodes:  No lymphadenopathy noted Inguinal Nodes:  No significant adenopathy Psych:  normal affect, talkative and pleasant    Impression & Recommendations:  Problem # 1:  HEALTH MAINTENANCE EXAM (ICD-V70.0) Assessment Comment Only reviewed health habits including diet, exercise and skin cancer prevention reviewed health maintenance list and family history wellness labs today  Problem # 2:  COLONIC POLYPS, HX OF (ICD-V12.72) Assessment: Unchanged is utd colonosc without bowel changes  due in 2017 for next colonosc  Problem # 3:  Hx of THROMBOCYTOPENIA (ICD-287.5) Assessment: Improved check cbc with diff today has been imp with splenectomy I think he needs menningiococcal vaccine - he will check with ins about that Orders: Venipuncture (16109) TLB-Lipid Panel (80061-LIPID) TLB-Renal Function Panel (80069-RENAL) TLB-CBC Platelet - w/Differential (85025-CBCD) TLB-Hepatic/Liver Function Pnl (80076-HEPATIC) TLB-TSH (Thyroid Stimulating Hormone) (84443-TSH) TLB-PSA (Prostate Specific Antigen) (84153-PSA)  Problem # 4:  TOBACCO ABUSE (ICD-305.1) Assessment: Unchanged discussed in detail risks of smoking, and possible outcomes including COPD, vascular dz, cancer and also respiratory infections/sinus problems  pt trying to cut down  is somewhat motivated - does not want help  Problem # 5:  HYPERLIPIDEMIA (ICD-272.4) lipids today rev low sat fat diet  Orders: Venipuncture (60454) TLB-Lipid Panel (80061-LIPID) TLB-Renal Function Panel  (80069-RENAL) TLB-CBC Platelet - w/Differential (85025-CBCD) TLB-Hepatic/Liver Function Pnl (80076-HEPATIC) TLB-TSH (Thyroid Stimulating Hormone) (84443-TSH) TLB-PSA (Prostate Specific Antigen) (84153-PSA)  Problem # 6:  SPECIAL SCREENING MALIGNANT NEOPLASM OF PROSTATE (ICD-V76.44) DRE nl  no symptoms  psa today Orders: Venipuncture (09811) TLB-Lipid Panel (80061-LIPID) TLB-Renal Function Panel (80069-RENAL) TLB-CBC Platelet - w/Differential (85025-CBCD) TLB-Hepatic/Liver Function Pnl (80076-HEPATIC) TLB-TSH (Thyroid Stimulating Hormone) (84443-TSH) TLB-PSA (Prostate Specific Antigen) (84153-PSA)  Complete Medication List: 1)  Zoloft 100 Mg Tabs (Sertraline hcl) .... Take one half by mouth daily 2)  Calcium Carbonate Powd (Calcium carbonate) .Marland Kitchen.. 1 tablet by mouth once daily 3)  Vitamin D 1000 Unit Tabs (Cholecalciferol) .... One daily  Patient Instructions: 1)  because of your splenectomy -- I think you need a meningiococcal vaccine ( meningitis vaccine) -- please call your insurance to see if it covers that  2)  also ask about the shingles vaccine  3)  labs today 4)  please eat a healthy diet and take care of your self 5)  let us know if you are interested in those vaccines   Orders Added: 1)  Venipuncture [36415] 2)  TLB-Lipid Panel [80061-LIPID] 3)  TLB-Renal Function Panel [80069-RENAL] 4)  TLB-CBC Platelet - w/Differential [85025-CBCD] 5)  TLB-Hepatic/Liver Function Pnl [80076-HEPATIC] 6)  TLB-TSH (Thyroid Stimulating Hormone) [84443-TSH] 7)  TLB-PSA (Prostate Specific Antigen) [84153-PSA] 8)  Est. Patient 65& > [91478]  Current Allergies (reviewed today): No known allergies

## 2010-07-01 NOTE — Assessment & Plan Note (Signed)
Summary: FLU SHOT/CLE   Nurse Visit   Allergies: No Known Drug Allergies  Orders Added: 1)  Flu Vaccine 12yrs + MEDICARE PATIENTS [Q2039] 2)  Administration Flu vaccine - MCR [G0008]  Flu Vaccine Consent Questions     Do you have a history of severe allergic reactions to this vaccine? no    Any prior history of allergic reactions to egg and/or gelatin? no    Do you have a sensitivity to the preservative Thimersol? no    Do you have a past history of Guillan-Barre Syndrome? no    Do you currently have an acute febrile illness? no    Have you ever had a severe reaction to latex? no    Vaccine information given and explained to patient? yes    Are you currently pregnant? no    Lot Number:[id number]   Exp Date:11/27/2010   Site Given  Left Deltoid IM

## 2010-09-14 LAB — CBC
HCT: 41 % (ref 39.0–52.0)
Hemoglobin: 14.1 g/dL (ref 13.0–17.0)
WBC: 5.7 10*3/uL (ref 4.0–10.5)

## 2010-09-17 ENCOUNTER — Other Ambulatory Visit (HOSPITAL_COMMUNITY): Payer: Self-pay | Admitting: Oncology

## 2010-09-17 ENCOUNTER — Encounter (HOSPITAL_BASED_OUTPATIENT_CLINIC_OR_DEPARTMENT_OTHER): Payer: Medicare Other | Admitting: Oncology

## 2010-09-17 DIAGNOSIS — C321 Malignant neoplasm of supraglottis: Secondary | ICD-10-CM

## 2010-09-17 DIAGNOSIS — D693 Immune thrombocytopenic purpura: Secondary | ICD-10-CM

## 2010-09-17 DIAGNOSIS — C320 Malignant neoplasm of glottis: Secondary | ICD-10-CM

## 2010-09-17 LAB — CBC WITH DIFFERENTIAL/PLATELET
Basophils Absolute: 0 10*3/uL (ref 0.0–0.1)
EOS%: 1.4 % (ref 0.0–7.0)
HCT: 41.1 % (ref 38.4–49.9)
HGB: 13.7 g/dL (ref 13.0–17.1)
LYMPH%: 28.8 % (ref 14.0–49.0)
MCH: 31.8 pg (ref 27.2–33.4)
MCV: 95.5 fL (ref 79.3–98.0)
MONO%: 9.3 % (ref 0.0–14.0)
NEUT%: 60.1 % (ref 39.0–75.0)

## 2010-09-17 LAB — COMPREHENSIVE METABOLIC PANEL
AST: 20 U/L (ref 0–37)
Alkaline Phosphatase: 66 U/L (ref 39–117)
BUN: 17 mg/dL (ref 6–23)
Calcium: 9.5 mg/dL (ref 8.4–10.5)
Creatinine, Ser: 0.85 mg/dL (ref 0.40–1.50)

## 2010-10-12 NOTE — Op Note (Signed)
NAME:  [name], [name]                ACCOUNT NO.:  [id number]   MEDICAL RECORD NO.:  [id number]          PATIENT TYPE:  AMB   LOCATION:  DSC                          FACILITY:  [facility]   PHYSICIAN:  Christopher E. Ezzard Standing, M.D.DATE OF BIRTH:  04/07/1932   DATE OF PROCEDURE:  02/13/2007  DATE OF DISCHARGE:                               OPERATIVE REPORT   PREOPERATIVE DIAGNOSIS:  Right vocal cord lesion.   POSTOPERATIVE DIAGNOSIS:  Right vocal cord lesion.   OPERATION:  Microlaryngoscopy with biopsy of a right vocal cord lesion.   SURGEON:  Narda Bonds, MD   ANESTHESIA:  General endotracheal.   COMPLICATIONS:  None.   BRIEF CLINICAL NOTE:  Daniel Chapman is a 77 year old gentleman with ITP,  followed by Hematology/Oncology Clinic.  He had a significant smoking  history and has had hoarseness now for 3-4 months.  On exam in the  office, he has a large white lesion partially obstructing the right  vocal cord.  He is taken to the operating room at this time for direct  laryngoscopy and biopsy.   DESCRIPTION OF PROCEDURE:  After adequate endotracheal anesthesia,  direct laryngoscopy was performed.  The base of tongue, vallecula and  epiglottis were normal and both piriform sinuses were clear.  On  evaluation of the endolarynx, the patient had a large white exophytic  lesion obstructing the majority of the right vocal cord.  It extended  back over the arytenoid posterior and all the way up to the anterior  commissure and extended into the ventricle, up onto the false cord.  Photos were obtained and then several biopsies were obtained from the  large lesion.  A cotton pledget soaked in adrenaline was placed for  hemostasis.  This completed the procedure.  Specimens were sent to  Pathology.  Narek was awoken from anesthesia and transferred to the  recovery room postop doing well.   DISPOSITION:  [name] is to be discharged home later this morning on  Tylenol and Vicodin p.r.n. pain,  voice rest for next couple days and  antacid therapy.  We will have him follow up in my office in 5-6 days to  review pathology.           ______________________________  Kristine Garbe Ezzard Standing, M.D.     CEN/MEDQ  D:  02/13/2007  T:  02/13/2007  Job:  [id number]   cc:   Marne A. Milinda Antis, MD  Samul Dada, M.D.

## 2010-10-12 NOTE — Op Note (Signed)
NAME:  [name], [name]                ACCOUNT NO.:  [id number]   MEDICAL RECORD NO.:  [id number]          PATIENT TYPE:  AMB   LOCATION:  DSC                          FACILITY:  [facility]   PHYSICIAN:  Christopher E. Ezzard Standing, M.D.DATE OF BIRTH:  December 08, 1931   DATE OF PROCEDURE:  07/15/2008  DATE OF DISCHARGE:                               OPERATIVE REPORT   PREOPERATIVE DIAGNOSIS:  Hoarseness with right vocal cord lesion.   POSTOPERATIVE DIAGNOSIS:  Hoarseness with right vocal cord lesion.   OPERATION:  Microlaryngoscopy with biopsy of right posterior vocal cord  lesion and posterior glottic lesion.   SURGEON:  Kristine Garbe. Ezzard Standing, MD   ANESTHESIA:  General endotracheal.   COMPLICATIONS:  None.   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  He is taken to the  operating room at this time for microlaryngoscopy and biopsy of this  recurrent leukoplakia.  Of note, on his preoperative chest x-ray, he was  also noted to have a right apical opacity and Radiology recommended  chest CT for further evaluation of this.   DESCRIPTION OF PROCEDURE:  After adequate endotracheal anesthesia with a  5.5 endotracheal tube, direct laryngoscopy was performed.  The  epiglottis was very edematous with the mucosa covered, but no obvious  mucosal lesions noted at the base of tongue.  Vallecula area was clear.  Both piriform sinuses were clear.  On evaluation of the right AE fold  and laryngeal surface and the area of the posterior right vocal cord and  posterior false cord, there was some area of leukoplakia and slight  irregularity.  This extended down to the  posterior glottis area.  Several biopsies of the posterior glottis as well as the posterior right  true vocal cord were obtained.  A cotton pledgets soaked in adrenaline  were placed for hemostasis.  Of note, the left vocal cord and anterior  commissure was clear.  This completed the procedure.  Hermenegildo was  extubated, awoke from anesthesia, and transferred to the recovery room,  postop doing well.   DISPOSITION:  Raden Byington is discharged home later this morning on  Nexium 40 mg daily for the next 3 weeks and will continue with his  regular medications and given Tylenol No. 3 p.r.n. pain.  We will have  him follow up in my office in 1 week for recheck and review of biopsy  specimens.  He will also need to be scheduled for a CT scan of the chest  to evaluate right apical lesion noted on chest x-ray.           ______________________________  Kristine Garbe. Ezzard Standing, M.D.     CEN/MEDQ  D:  07/15/2008  T:  07/15/2008  Job:  [id number]   cc:  Maryln Gottron, M.D.

## 2010-10-15 NOTE — Op Note (Signed)
NAME:  CRIAG, WICKLUND                          ACCOUNT NO.:  [id number]   MEDICAL RECORD NO.:  [id number]                   PATIENT TYPE:  INP   LOCATION:  0448                                 FACILITY:  [facility]   PHYSICIAN:  Ollen Gross. Vernell Morgans, M.D.              DATE OF BIRTH:  February 01, 1932   DATE OF PROCEDURE:  11/10/2003  DATE OF DISCHARGE:  11/12/2003                                 OPERATIVE REPORT   PREOPERATIVE DIAGNOSES:  Idiopathic thrombocytopenic purpura.   POSTOPERATIVE DIAGNOSES:  Idiopathic thrombocytopenic purpura.   PROCEDURE:  Laparoscopic splenectomy.   SURGEON:  Ollen Gross. Carolynne Edouard, M.D.   ASSISTANT:  Sharlet Salina T. Hoxworth, M.D.   ANESTHESIA:  General endotracheal.   DESCRIPTION OF PROCEDURE:  After informed consent was obtained, the patient  was brought to the operating room, placed in the supine position on the  operating room table. After adequate induction of general anesthesia, the  patient was placed in an approximately 45 degree left side up lateral  position.  All pressure points were then padded, the patient's abdomen was  then prepped with Betadine, draped in the usual sterile manner.  The area  above the umbilicus was infiltrated with 0.25% Marcaine, a small incision  was made with a 15 blade knife, this incision was carried down through the  subcutaneous tissue bluntly with a Kelly clamp and [employer] retractors  until the linea alba was identified. The linea alba was incised with a 15  blade knife and each side was grasped with Kocher clamps and elevated  anteriorly.  The preperitoneal space was then probed bluntly with a hemostat  until the peritoneum was opened and access was gained to the abdominal  cavity.  A #0 Vicryl pursestring stitch was placed in the fascia surrounding  the opening, a Hasson cannula was placed through the opening and anchored in  place with the previously placed Vicryl pursestring stitch. The abdomen was  then insufflated with carbon  dioxide without difficulty. The patient was  placed in the head up position and rotated slightly to the right.  Next, the  epigastric region was infiltrated with 0.25% Marcaine and a small incision  was made with the 15 blade knife and a 10 mm port was placed bluntly through  this incision into the abdominal cavity under direct vision.  Sites were  then chosen laterally on the left side of the abdomen for placement of two  other working ports below the costal margin. Each of these areas was  infiltrated with 0.25% Marcaine, small incisions were made with a 15 blade  knife and 12 mm ports were placed bluntly through these incisions into the  abdominal cavity under direct vision.  One 12 mm port and one 5 mm port were  placed through these incisions under direct vision into the abdominal  cavity.  The left upper quadrant was inspected, the laparoscope was placed  through the  Hasson cannula.  The spleen appeared to have a normal size and  appearance.  Initially some adhesions of the left colon to the anterior  abdominal wall were taken down sharply with the harmonic scalpel. This  allowed good access to the left upper quadrant.  Next, the attachment of the  spleen to the diaphragm and retroperitoneum was opened by sharp dissection  with the harmonic scalpel as well as blunt dissection also with the harmonic  scalpel.  Once this dissection was accomplished close to the superior pole  of the spleen, the spleen appeared to be undermined inferiorly pretty well.  This sort of combination of blunt dissection and sharp harmonic scalpel  dissection was extended medially until the dissection was carried near to  the hilar vessels.  At this point, some of the short gastric's and the fatty  tissue near the hilum of the spleen was also divided sharply with the  harmonic scalpel.  This allowed the spleen to be pretty mobile and once this  was accomplished, the hilum of the spleen was fairly easy to see.   Some  gently blunt dissection was carried out of the hilum to allow better  visualization of the vessels and at this point, a laparoscopic GIA stapler  with a vascular load was brought in inferiorly through one of the lateral 12  mm ports and placed across the hilum of the spleen, clamped and fired  thereby dividing the hilar vessels between staple lines.  This was done a  total of approximately four times until the hilum had been completed  divided. A small portion of the superior pole of the spleen was still  attached to the diaphragm and this was also taken down sharply with the  harmonic scalpel.  Once this was accomplished, the spleen was completely  free. Next, a laparoscopic large bag was placed through the lateral most 12  mm incision after the 12 mm port was removed.  The spleen was able to be  scooped up into the bag without much difficulty and once this was done, the  bag was sealed.  The opening of the bag was then brought out through the  lateral most 12 mm port site. The incision needed to be extended another  centimeter or two medially fullthickness through the fascia in order to open  the bag enough to morcellate the spleen. Once this was accomplished and the  end of the bag was outside of the abdomen, the spleen was able to be  morcellated with blunt finger dissection as well as with dissection with a  ring forceps.  This allowed the spleen to be removed through the small  incision in the bag and once it was removed from the patient it was sent to  pathology for further evaluation.  Care was taken to make sure no small  pieces of spleen were able to be dropped into the wound.  At this point, the  12 mm port was replaced through this opening and sealed by a towel clip.  The abdomen was reinsufflated and the left upper quadrant was inspected.  There was a small bleeding point just anterior to the tip of the pancreas and this was controlled with the harmonic scalpel without  difficulty.  The  pancreas was visualized and was in good condition without any injury that  could be noted. The rest of the left upper quadrant was hemostatic. All  areas were irrigated with copious amounts of saline and at this point, the  ports were all removed under direct vision and found to be hemostatic. The  lateral most 12 mm port that was opened to get the spleen out was closed  with interrupted #1 PDS suture.  The fascial defect at the Hasson cannula  was closed with the previously placed Vicryl pursestring stitch as well as  with another interrupted #0 Vicryl stitch.  All of the skin incisions were  closed with interrupted 4-0 Monocryl subcuticular stitches, Benzoin, Steri-  Strips and sterile dressings were applied.  The patient tolerated the  procedure well. At the end of the case, all sponge, needle and instrument  counts were correct.  The patient was then awakened and taken to the  recovery room in stable condition.                                               Ollen Gross. Vernell Morgans, M.D.    PST/MEDQ  D:  11/19/2003  T:  11/19/2003  Job:  [id number]

## 2010-10-15 NOTE — Discharge Summary (Signed)
NAME:  Daniel Chapman, Daniel Chapman                          ACCOUNT NO.:  [id number]   MEDICAL RECORD NO.:  [id number]                   PATIENT TYPE:  INP   LOCATION:  0448                                 FACILITY:  [facility]   PHYSICIAN:  Ollen Gross. Vernell Morgans, M.D.              DATE OF BIRTH:  1931/06/16   DATE OF ADMISSION:  11/10/2003  DATE OF DISCHARGE:  11/12/2003                                 DISCHARGE SUMMARY   HISTORY OF PRESENT ILLNESS:  Daniel Chapman is a 77 year old white male with  idiopathic thrombocytopenic purpura.  He was brought to the operating room  on November 10, 2003 for laparoscopic splenectomy.  He tolerated this operation  well.  On postoperative day #1, he was started on a diet, which he tolerated  well.  He was covered with hydrocortisone postoperatively, and he had  previously had his vaccinations.  He also started to ambulate on  postoperative day #1.  He did very well, and on postoperative day #2 was  actually tolerating his diet and was ready for discharge home.   MEDICATIONS AT THE TIME OF DISCHARGE:  He was to resume his home meds  including -  1. His home dose of prednisone.  2. He was given a prescription for Vicodin for pain.   ACTIVITY:  No heavy lifting.   DIET:  No restrictions.   CONDITION:  Stable.   FINAL DIAGNOSIS:  Idiopathic thrombocytopenic purpura.   FOLLOW UP:  He will follow up with Dr. Carolynne Edouard in 2 weeks.                                               Ollen Gross. Vernell Morgans, M.D.    PST/MEDQ  D:  12/10/2003  T:  12/11/2003  Job:  [id number]

## 2011-04-06 ENCOUNTER — Ambulatory Visit (INDEPENDENT_AMBULATORY_CARE_PROVIDER_SITE_OTHER): Payer: Medicare Other

## 2011-04-06 DIAGNOSIS — Z23 Encounter for immunization: Secondary | ICD-10-CM

## 2011-07-06 ENCOUNTER — Telehealth: Payer: Self-pay | Admitting: Oncology

## 2011-07-06 NOTE — Telephone Encounter (Signed)
called pt with appts per pof aom

## 2011-07-10 ENCOUNTER — Telehealth: Payer: Self-pay | Admitting: Family Medicine

## 2011-07-10 DIAGNOSIS — D696 Thrombocytopenia, unspecified: Secondary | ICD-10-CM

## 2011-07-10 DIAGNOSIS — E785 Hyperlipidemia, unspecified: Secondary | ICD-10-CM

## 2011-07-10 DIAGNOSIS — Z125 Encounter for screening for malignant neoplasm of prostate: Secondary | ICD-10-CM

## 2011-07-10 NOTE — Telephone Encounter (Signed)
Message copied by Judy Pimple on Sun Jul 10, 2011  8:44 PM ------      Message from: Alvina Chou      Created: Wed Jul 06, 2011 11:43 AM      Regarding: Lab orders for 07-11-11       Patient is scheduled for CPX labs, please order future labs, Thanks , Daniel Chapman

## 2011-07-11 ENCOUNTER — Other Ambulatory Visit (INDEPENDENT_AMBULATORY_CARE_PROVIDER_SITE_OTHER): Payer: Medicare Other

## 2011-07-11 DIAGNOSIS — Z125 Encounter for screening for malignant neoplasm of prostate: Secondary | ICD-10-CM

## 2011-07-11 DIAGNOSIS — D696 Thrombocytopenia, unspecified: Secondary | ICD-10-CM

## 2011-07-11 DIAGNOSIS — E785 Hyperlipidemia, unspecified: Secondary | ICD-10-CM

## 2011-07-11 LAB — COMPREHENSIVE METABOLIC PANEL
ALT: 13 U/L (ref 0–53)
AST: 17 U/L (ref 0–37)
Albumin: 3.8 g/dL (ref 3.5–5.2)
Alkaline Phosphatase: 57 U/L (ref 39–117)
Glucose, Bld: 104 mg/dL — ABNORMAL HIGH (ref 70–99)
Potassium: 4.7 mEq/L (ref 3.5–5.1)
Sodium: 137 mEq/L (ref 135–145)
Total Protein: 7.3 g/dL (ref 6.0–8.3)

## 2011-07-11 LAB — LIPID PANEL
Cholesterol: 161 mg/dL (ref 0–200)
LDL Cholesterol: 107 mg/dL — ABNORMAL HIGH (ref 0–99)
Total CHOL/HDL Ratio: 5
VLDL: 22.4 mg/dL (ref 0.0–40.0)

## 2011-07-11 LAB — CBC WITH DIFFERENTIAL/PLATELET
Basophils Relative: 0.8 % (ref 0.0–3.0)
Eosinophils Relative: 2 % (ref 0.0–5.0)
Lymphocytes Relative: 28.6 % (ref 12.0–46.0)
Neutrophils Relative %: 58.8 % (ref 43.0–77.0)
Platelets: 161 10*3/uL (ref 150.0–400.0)
RBC: 4.66 Mil/uL (ref 4.22–5.81)
WBC: 8.7 10*3/uL (ref 4.5–10.5)

## 2011-07-11 LAB — PSA: PSA: 0.48 ng/mL (ref 0.10–4.00)

## 2011-07-18 ENCOUNTER — Encounter: Payer: Self-pay | Admitting: Family Medicine

## 2011-07-19 ENCOUNTER — Encounter: Payer: Self-pay | Admitting: Family Medicine

## 2011-07-19 ENCOUNTER — Ambulatory Visit (INDEPENDENT_AMBULATORY_CARE_PROVIDER_SITE_OTHER): Payer: Medicare Other | Admitting: Family Medicine

## 2011-07-19 ENCOUNTER — Ambulatory Visit (INDEPENDENT_AMBULATORY_CARE_PROVIDER_SITE_OTHER)
Admission: RE | Admit: 2011-07-19 | Discharge: 2011-07-19 | Disposition: A | Discharge status: Home / Self Care | Payer: Medicare Other | Source: Ambulatory Visit | Attending: Family Medicine | Admitting: Family Medicine

## 2011-07-19 VITALS — BP 114/72 | HR 72 | Temp 97.6°F | Ht 67.0 in | Wt 128.8 lb

## 2011-07-19 DIAGNOSIS — M899 Disorder of bone, unspecified: Secondary | ICD-10-CM

## 2011-07-19 DIAGNOSIS — E785 Hyperlipidemia, unspecified: Secondary | ICD-10-CM

## 2011-07-19 DIAGNOSIS — C321 Malignant neoplasm of supraglottis: Secondary | ICD-10-CM

## 2011-07-19 DIAGNOSIS — D696 Thrombocytopenia, unspecified: Secondary | ICD-10-CM

## 2011-07-19 DIAGNOSIS — M810 Age-related osteoporosis without current pathological fracture: Secondary | ICD-10-CM | POA: Insufficient documentation

## 2011-07-19 DIAGNOSIS — Z125 Encounter for screening for malignant neoplasm of prostate: Secondary | ICD-10-CM

## 2011-07-19 DIAGNOSIS — Z86010 Personal history of colonic polyps: Secondary | ICD-10-CM

## 2011-07-19 DIAGNOSIS — F17200 Nicotine dependence, unspecified, uncomplicated: Secondary | ICD-10-CM

## 2011-07-19 DIAGNOSIS — M8580 Other specified disorders of bone density and structure, unspecified site: Secondary | ICD-10-CM | POA: Insufficient documentation

## 2011-07-19 NOTE — Assessment & Plan Note (Signed)
Pt needs cxr sent to Dr Ezzard Standing for surveillance  No problems  Voice sounds quite good today Pt continues to smoke and will not quit

## 2011-07-19 NOTE — Assessment & Plan Note (Signed)
Disc in detail risks of smoking and possible outcomes including copd, vascular/ heart disease, cancer , respiratory and sinus infections  Pt voices understanding He is not ready to quit yet 

## 2011-07-19 NOTE — Progress Notes (Signed)
Subjective:    Patient ID: Daniel Chapman, male    DOB: 20-Jun-1931, 78 y.o.   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, community on going   Hx of splenectomy for ITP Platelet 161 this check (this is good - goes up and down)  No prednisone since splenectomy    Osteopenia from prednisone in past dexa 2/06- due for another-- and he declines it  Ca and D Is taking tums and vitamin D   Saw Dr Ezzard Standing in dec- sees him every 6 months  Is doing well with that  Says he needs a chest x ray    psa good .48 symtoms- none  Does have nocturia one time at night   Lipids Lab Results  Component Value Date   CHOL 161 07/11/2011   CHOL 177 05/12/2010   CHOL 148 05/08/2009   Lab Results  Component Value Date   HDL 31.80* 07/11/2011   HDL 27.00* 05/12/2010   HDL 33.00* 05/08/2009   Lab Results  Component Value Date   LDLCALC 107* 07/11/2011   LDLCALC 126* 05/12/2010   LDLCALC 102* 05/08/2009   Lab Results  Component Value Date   TRIG 112.0 07/11/2011   TRIG 122.0 05/12/2010   TRIG 64.0 05/08/2009   Lab Results  Component Value Date   CHOLHDL 5 07/11/2011   CHOLHDL 7 05/12/2010   CHOLHDL 4 05/08/2009   No results found for this basename: LDLDIRECT   pretty good with baseline low HDL   Zoster status- has never had vaccine - is interested if vaccine is covered   Tab status - 1/2 ppd - tries to cut back- too much stress to quit No breathing problems  lately/ no cough Has known copd without medicines   colonosc 1/10- unsure when he is due for f/u  Hx of polyps   Patient Active Problem List  Diagnoses  . MALIGNANT NEOPLASM OF SUPRAGLOTTIS  . HYPERLIPIDEMIA  . THROMBOCYTOPENIA  . TOBACCO ABUSE  . COPD  . HOARSENESS, CHRONIC  . COLONIC POLYPS, HX OF  . PROSTATITIS, HX OF  . Prostate cancer screening  . Osteopenia   Past Medical History  Diagnosis Date  . COPD (chronic obstructive pulmonary disease)   . Hyperlipemia   . Hx of colonic polyps   . 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 History of ITP   . Smoker   . Osteopenia     ?  Marland Kitchen Osteoporosis     dexa 06, declines further dexa or tx    Past Surgical History  Procedure Date  . Cataract extraction   . Hemorrhoid surgery   . Splenectomy   . Bone marrow biopsy 05/2003  . Cholecystectomy   . Dexa     per patient ? osteopenia   History  Substance Use Topics  . Smoking status: Current Everyday Smoker -- 0.5 packs/day  . Smokeless tobacco: Not on file  . Alcohol Use: No   Family History  Problem Relation Age of Onset  . Cancer Mother     uterine  . Heart disease Sister     some heart trouble   No Known Allergies Current Outpatient Prescriptions on File Prior to Visit  Medication Sig Dispense Refill  . CALCIUM CARBONATE PO Take by mouth daily.      . cholecalciferol (VITAMIN D) 1000 UNITS tablet Take 1,000 Units by mouth daily.           Review of Systems Review of Systems  Constitutional: Negative for fever, appetite change, fatigue and unexpected weight change.  Eyes: Negative for pain and visual disturbance.  ENT no st , some improvement in hoarseness Respiratory: Negative for cough and wheeze, pos for baseline sob on exertion  Cardiovascular: Negative for cp or palpitations    Gastrointestinal: Negative for nausea, diarrhea and constipation.  Genitourinary: Negative for urgency and frequency.  Skin: Negative for pallor or rash     Neurological: Negative for weakness, light-headedness, numbness and headaches.  Hematological: Negative for adenopathy. Does not bruise/bleed easily.  Psychiatric/Behavioral: . The patient is not nervous/anxious.  he does get down at times- denies depression or need for meds         Objective:   Physical Exam  Constitutional: He appears well-developed and well-nourished. No distress.       Slim and well appearing   HENT:  Head: Normocephalic and atraumatic.  Right Ear: External ear normal.  Left Ear: External ear normal.  Nose: Nose normal.  Mouth/Throat: Oropharynx is clear and moist.       Is HOH  Baseline hoarseness  Eyes: Conjunctivae and EOM are normal. Pupils are equal, round, and reactive to light. No scleral icterus.  Neck: Normal range of motion. Neck supple. No JVD present. Carotid bruit is not present. No tracheal deviation present. No thyromegaly present.  Cardiovascular: Normal rate, regular rhythm, normal heart sounds and intact distal pulses.  Exam reveals no gallop.   Pulmonary/Chest: Effort normal and breath sounds normal. No respiratory distress. He has no wheezes. He has no rales.       Diffusely distant bs   Abdominal: Soft. Bowel sounds are normal. He exhibits no distension, no abdominal bruit and no mass. There is no tenderness.  Genitourinary: Rectum normal and prostate normal. Guaiac negative stool.       Prostate symmetric/ nl size/ firm and nt   Musculoskeletal: Normal range of motion. He exhibits no edema and no tenderness.  Lymphadenopathy:    He has no cervical adenopathy.  Neurological: He is alert. He has normal reflexes. No cranial nerve deficit. He exhibits normal muscle tone. Coordination normal.  Skin: Skin is warm and dry. No rash noted. No erythema. No pallor.       Ruddy complexion No bruising   Psychiatric: He has a normal mood and affect.       Baseline hyperactive personality with very loud speech          Assessment & Plan:

## 2011-07-19 NOTE — Assessment & Plan Note (Signed)
Pt is up to date on colonoscopy form 2010 with no symptoms

## 2011-07-19 NOTE — Assessment & Plan Note (Signed)
psa normal  Nl DRE  No symptoms Will continue to follow

## 2011-07-19 NOTE — Assessment & Plan Note (Signed)
Lipids are fairly stable - with good LDL and low HDL Disc goals for lipids and reasons to control them Rev labs with pt Rev low sat fat diet in detail  Could take omega 3 for HDL- unsure if pt wants to do this

## 2011-07-19 NOTE — Assessment & Plan Note (Signed)
Pt has known osteopenia after long hx of prednisone and smoking Last dexa 06 He refuses further ones - would not take bone building med anyway Urged to continue ca and D - he is good about that  Still smoking unfortunately

## 2011-07-19 NOTE — Patient Instructions (Signed)
Please keep thinking about quitting smoking Continue your calcium and vitamin D If you are interested in a shingles/zoster vaccine - call your insurance to check on coverage,( you should not get it within 1 month of other vaccines) , then call us for a prescription  for it to take to a pharmacy that gives the shot or come here Chest x ray today Try to eat a healthy diet with regular meals

## 2011-07-19 NOTE — Assessment & Plan Note (Signed)
Much better after splenectomy No longer on steroids 161 last platelet count- very good for him No bruising or bleeding

## 2011-07-29 ENCOUNTER — Telehealth: Payer: Self-pay | Admitting: Family Medicine

## 2011-07-29 NOTE — Telephone Encounter (Signed)
Eunice Blase is calling to find out if you received the recommendation for medication therapy faxed on 07/26/11.

## 2011-08-01 NOTE — Telephone Encounter (Signed)
I do not see anything - ? From where are they calling ?

## 2011-08-04 NOTE — Telephone Encounter (Signed)
Left message for Eunice Blase, pharmacist at [facility] for her to call back if this is something that we need to follow up on.

## 2011-09-16 ENCOUNTER — Other Ambulatory Visit (HOSPITAL_BASED_OUTPATIENT_CLINIC_OR_DEPARTMENT_OTHER): Payer: Medicare Other | Admitting: Lab

## 2011-09-16 ENCOUNTER — Ambulatory Visit (HOSPITAL_BASED_OUTPATIENT_CLINIC_OR_DEPARTMENT_OTHER): Payer: Medicare Other | Admitting: Oncology

## 2011-09-16 ENCOUNTER — Encounter: Payer: Self-pay | Admitting: Oncology

## 2011-09-16 VITALS — BP 127/81 | HR 72 | Temp 96.9°F | Ht 67.0 in | Wt 128.1 lb

## 2011-09-16 DIAGNOSIS — C320 Malignant neoplasm of glottis: Secondary | ICD-10-CM

## 2011-09-16 DIAGNOSIS — C321 Malignant neoplasm of supraglottis: Secondary | ICD-10-CM

## 2011-09-16 DIAGNOSIS — D693 Immune thrombocytopenic purpura: Secondary | ICD-10-CM

## 2011-09-16 DIAGNOSIS — F17200 Nicotine dependence, unspecified, uncomplicated: Secondary | ICD-10-CM

## 2011-09-16 DIAGNOSIS — D696 Thrombocytopenia, unspecified: Secondary | ICD-10-CM

## 2011-09-16 LAB — CBC WITH DIFFERENTIAL/PLATELET
BASO%: 0.8 % (ref 0.0–2.0)
EOS%: 0.7 % (ref 0.0–7.0)
HCT: 41.1 % (ref 38.4–49.9)
MCH: 31.5 pg (ref 27.2–33.4)
MCHC: 33.2 g/dL (ref 32.0–36.0)
MONO#: 0.7 10*3/uL (ref 0.1–0.9)
NEUT%: 59.9 % (ref 39.0–75.0)
RBC: 4.32 10*6/uL (ref 4.20–5.82)
RDW: 13.2 % (ref 11.0–14.6)
WBC: 7.6 10*3/uL (ref 4.0–10.3)
lymph#: 2.3 10*3/uL (ref 0.9–3.3)
nRBC: 0 % (ref 0–0)

## 2011-09-16 LAB — COMPREHENSIVE METABOLIC PANEL
AST: 18 U/L (ref 0–37)
Alkaline Phosphatase: 57 U/L (ref 39–117)
Glucose, Bld: 121 mg/dL — ABNORMAL HIGH (ref 70–99)
Potassium: 4.3 mEq/L (ref 3.5–5.3)
Sodium: 138 mEq/L (ref 135–145)
Total Bilirubin: 0.5 mg/dL (ref 0.3–1.2)
Total Protein: 7 g/dL (ref 6.0–8.3)

## 2011-09-16 NOTE — Progress Notes (Signed)
CC:   Marne A. Milinda Antis, MD Ollen Gross. Vernell Morgans, M.D. Maryln Gottron, M.D. Kristine Garbe. Ezzard Standing, M.D.  PROBLEM LIST: 1. ITP (idiopathic thrombocytopenic purpura) diagnosed in July 2004     status post laparoscopic splenectomy on 11/10/2003 preceded by     prednisone and Imuran.  The patient remains in a state of     remission, on no treatment for his ITP. 2. 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  The patient remains disease free; sees Dr.     Dillard Cannon every 6 months for followup. 3. Chronic obstructive pulmonary disease. 4. Long history of cigarette smoking. 5. Osteopenia by bone density scan carried out 07/09/2004. 6. History of depression. 7. Hearing impairment. 8. History of colonic polyps.  MEDICATIONS: 1. Calcium carbonate. 2. Cholecalciferol 1000 units daily.  HISTORY:  Sho Salguero was seen today for followup primarily of his ITP which was diagnosed in July 2004 currently in remission after laparoscopic splenectomy on 11/10/2003.  The patient also has a history of a well differentiated squamous cell carcinoma of the right true vocal cord stage II now 4-1/2 years from the time of diagnosis and definitive radiation treatments.  The patient is without recurrent disease.  He was last seen by [place] on 09/17/2010.  He continues to do fairly well without any medical problems, hospitalizations or surgery.  He is without complaints today.  He continues to work.  He will be 78 years old on [birth year].  PHYSICAL EXAMINATION:  There is little change.  The patient is quite thin, wiry, vigorous for his age.  Weight is 128 pounds, height 5 feet 7 inches, body surface area 1.66 meter square.  Blood pressure 127/81. Other vital signs are normal.  There is no scleral icterus.  Mouth and pharynx  are benign.  No evidence for recurrent tumor.  He has an upper plate.  Lower teeth are in very poor repair.  There is no adenopathy in the neck.  Heart and lungs are normal.  Abdomen scaphoid, nontender with no organomegaly or masses palpable.  Extremities with no peripheral edema or clubbing.  He has extensive purpura over the dorsum of his hands.  No history of undue bleeding.  Neurologic exam is grossly normal.  LABORATORY DATA:  Today, white count 7.6, ANC 4.6, hemoglobin 13.6, hematocrit 41.1, platelets 167,000.  A year ago platelets were 145,000 with a hemoglobin of 13.7.  Chemistries from 07/11/2011 were normal except for a minimal elevation of glucose at 104.  GFR was estimated to be 93 mL per minute.  On 09/17/2010 glucose was 112.  Liver function tests including an LDH were normal.  IMAGING STUDIES: 1. CT scan of the chest with IV contrast on 05/05/2010 showed a left     upper lobe pulmonary nodule that had decreased in size compared     with the CT scan of 11/23/2009.  It was felt this was benign.     There was a stable right lower lobe pulmonary nodule.  Stability     over 2 years supported a benign etiology for that nodule. 2. Chest x-ray, 2 view, from 07/19/2011 showed stable COPD/emphysema     with apical pleural parenchymal scarring with no significant     change.  IMPRESSION AND PLAN:  Mr. Pooler  continues to do well with no evidence for recurrent idiopathic thrombocytopenic purpura or head and neck cancer.  I have been following the patient for almost 9 years.  I saw him a year ago.  I asked him whether he wanted to continue to see [place] although I did not really see any medical reason that would require him to continue seeing [place] on a yearly basis.  The patient has opted for followup as needed.  It has certainly been my pleasure in participating in Mr. Laboy care. I have encouraged him to stopped smoking.  I will be happy to see him again should any questions or  concerns arise in the future.    ______________________________ Samul Dada, M.D. DSM/MEDQ  D:  09/16/2011  T:  09/16/2011  Job:  [id number]

## 2011-09-16 NOTE — Progress Notes (Signed)
This office note has been dictated.  #[id number]

## 2011-12-28 ENCOUNTER — Telehealth: Payer: Self-pay

## 2011-12-28 MED ORDER — SERTRALINE HCL 50 MG PO TABS: 50.0000 mg | ORAL_TABLET | Freq: Every day | ORAL | Status: DC

## 2011-12-28 NOTE — Telephone Encounter (Signed)
Pt left v/m having anxiety issues again. Pt wants Zoloft refilled; pt stated took Zoloft 4-5 years ago. Left pt v/m to call our office.

## 2011-12-28 NOTE — Telephone Encounter (Signed)
Patient's wife notified that Rx was sent in. Advised that patient must follow up in 4-6 weeks and sooner if possible. She wrote down message and said she would give it to patient.

## 2011-12-28 NOTE — Telephone Encounter (Signed)
Pt not available; spoke with pts wife. Pt having anxiety for several months and getting worse; pt is not sleeping and is not eating appropriately. 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 Request Zoloft called to [facility].(Pt took Zoloft approx 4 years ago).Please advise. Daniel Chapman request call back today.

## 2011-12-28 NOTE — Telephone Encounter (Signed)
Will restart as has taken well in past.   Will need to come in to see PCP in 4-6 wks for f/u.  But stress NEEDS to be seen in 4-6 wks if not now. Route to PCP as FYI.  Sent to [facility].

## 2012-01-27 ENCOUNTER — Ambulatory Visit (INDEPENDENT_AMBULATORY_CARE_PROVIDER_SITE_OTHER): Payer: Medicare Other | Admitting: Family Medicine

## 2012-01-27 ENCOUNTER — Encounter: Payer: Self-pay | Admitting: Family Medicine

## 2012-01-27 VITALS — BP 108/72 | HR 90 | Temp 97.6°F | Ht 68.0 in | Wt 111.0 lb

## 2012-01-27 DIAGNOSIS — F17200 Nicotine dependence, unspecified, uncomplicated: Secondary | ICD-10-CM

## 2012-01-27 DIAGNOSIS — F411 Generalized anxiety disorder: Secondary | ICD-10-CM

## 2012-01-27 DIAGNOSIS — F419 Anxiety disorder, unspecified: Secondary | ICD-10-CM | POA: Insufficient documentation

## 2012-01-27 MED ORDER — SERTRALINE HCL 100 MG PO TABS: 100.0000 mg | ORAL_TABLET | Freq: Every day | ORAL | Status: DC

## 2012-01-27 NOTE — Patient Instructions (Addendum)
I want to increase your zoloft from 50 to 100 mg once daily If you have any side effects , or feel worse or have any problems -- please let me know  Have a conversation about counseling- I think seeing a psychological counselor would be helpful  Please try to eat regularly  Follow up with me in about 4-6 weeks  Keep thinking about quitting smoking

## 2012-01-27 NOTE — Assessment & Plan Note (Signed)
Disc in detail risks of smoking and possible outcomes including copd, vascular/ heart disease, cancer , respiratory and sinus infections  Pt voices understanding He is not ready to quit yet- but perhaps when he is emotionally feeling better

## 2012-01-27 NOTE — Progress Notes (Signed)
Subjective:    Patient ID: Daniel Chapman, male    DOB: Mar 23, 1932, 78 y.o.   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 MALIGNANT NEOPLASM OF SUPRAGLOTTIS  . HYPERLIPIDEMIA  . THROMBOCYTOPENIA  . TOBACCO ABUSE  . COPD  . HOARSENESS, CHRONIC  . COLONIC POLYPS, HX OF  . PROSTATITIS, HX OF  . Prostate cancer screening  . Osteopenia  . Anxiety   Past Medical History  Diagnosis Date  . COPD (chronic obstructive pulmonary disease)   . Hyperlipemia   . Hx of colonic polyps   . 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 History of ITP   . Smoker   . Osteopenia     ?  Marland Kitchen Osteoporosis     dexa 06, declines further dexa or tx    Past Surgical History  Procedure Date  . Cataract extraction   . Hemorrhoid surgery   . Splenectomy   . Bone marrow biopsy 05/2003  . Cholecystectomy   . Dexa     per patient ? osteopenia   History  Substance Use Topics  . Smoking status: Current Everyday Smoker -- 0.5 packs/day  . Smokeless tobacco: Not on file  . Alcohol Use: No   Family History  Problem Relation Age of Onset  . Cancer Mother     uterine  . Heart disease Sister      some heart trouble   No Known Allergies Current Outpatient Prescriptions on File Prior to Visit  Medication Sig Dispense Refill  . CALCIUM CARBONATE PO Take by mouth daily.      . cholecalciferol (VITAMIN D) 1000 UNITS tablet Take 1,000 Units by mouth daily.      . sertraline (ZOLOFT) 50 MG tablet Take 1 tablet (50 mg total) by mouth daily.  30 tablet  1       Review of Systems Review of Systems  Constitutional: Negative for fever, fatigue  Eyes: Negative for pain and visual disturbance.  Respiratory: Negative for cough and shortness of breath.   Cardiovascular: Negative for cp or palpitations    Gastrointestinal: Negative for nausea, diarrhea and constipation.  Genitourinary: Negative for urgency and frequency.  Skin: Negative for pallor or rash   Neurological: Negative for weakness, light-headedness, numbness and headaches.  Hematological: Negative for adenopathy. Does not bruise/bleed easily.  Psychiatric/Behavioral: Negative for dysphoric mood. The patient is very nervous/ anxious/ neg for SI        Objective:   Physical Exam  Constitutional: He appears well-developed and well-nourished. No distress.       Slim/ frail appearing   HENT:  Head: Normocephalic and atraumatic.  Mouth/Throat: Oropharynx is clear and moist.  Eyes: Conjunctivae and EOM are normal. Pupils are equal, round, and reactive to light. No scleral icterus.  Neck: Normal range of motion. Neck supple. No JVD present. No thyromegaly present.  Cardiovascular: Normal rate, regular rhythm and normal heart sounds.  Exam reveals no gallop.   Pulmonary/Chest: Effort normal and breath sounds normal. No respiratory distress. He has no wheezes. He has no rales.       Diffusely distant bs   Musculoskeletal: He exhibits no edema.  Lymphadenopathy:    He has no cervical adenopathy.  Neurological: He is alert. He has normal reflexes. He displays tremor. No cranial nerve deficit. He exhibits normal muscle tone.  Coordination normal.       Slight hand tremor   Skin: Skin is warm and dry. No erythema. No pallor.  Psychiatric: His speech is normal and behavior is normal. Judgment and thought content normal. His mood appears anxious. His affect is labile. His affect is not blunt. He is not agitated, not aggressive, is not hyperactive, not slowed and not withdrawn. Cognition and memory are normal. He exhibits a depressed mood. He expresses no homicidal and no suicidal ideation.          Assessment & Plan:

## 2012-01-27 NOTE — Assessment & Plan Note (Signed)
Situational with a lot of worry and jitteriness/ mood change  Is on zoloft - and will increase from 50 to 100 mg  Disc poss side eff incl worsening of symptoms or depression - pt understands to stop and call if any issues If this does not help -  ? If could consider remeron as an option (he declines any px for sleep)  He declines counseling- which I feel would be most important - but admits he will think about it  His wife is a great support F/u 4-6 wk >25 min spent with face to face with patient, >50% counseling and/or coordinating care

## 2012-02-07 ENCOUNTER — Ambulatory Visit: Payer: Medicare Other | Admitting: Family Medicine

## 2012-03-07 ENCOUNTER — Encounter: Payer: Self-pay | Admitting: Family Medicine

## 2012-03-07 ENCOUNTER — Ambulatory Visit (INDEPENDENT_AMBULATORY_CARE_PROVIDER_SITE_OTHER): Payer: Medicare Other | Admitting: Family Medicine

## 2012-03-07 VITALS — BP 118/72 | HR 74 | Temp 97.4°F | Ht 67.0 in | Wt 116.0 lb

## 2012-03-07 DIAGNOSIS — F419 Anxiety disorder, unspecified: Secondary | ICD-10-CM

## 2012-03-07 DIAGNOSIS — F411 Generalized anxiety disorder: Secondary | ICD-10-CM

## 2012-03-07 DIAGNOSIS — F17200 Nicotine dependence, unspecified, uncomplicated: Secondary | ICD-10-CM

## 2012-03-07 DIAGNOSIS — Z23 Encounter for immunization: Secondary | ICD-10-CM

## 2012-03-07 MED ORDER — SERTRALINE HCL 100 MG PO TABS: 50.0000 mg | ORAL_TABLET | Freq: Every day | ORAL | Status: DC

## 2012-03-07 NOTE — Assessment & Plan Note (Signed)
Improved and pt is motivated to work on quitting in the future - fortunately cutting back has helped his appetite

## 2012-03-07 NOTE — Progress Notes (Signed)
Subjective:    Patient ID: Daniel Chapman, male    DOB: August 14, 1931, 78 y.o.   MRN: [id number]  HPI Here for follow up on anxiety  Last visit went up on zoloft to 100 mg  Decided to stay on 50 mg instead and has felt better ever since   Still having equiptment problems and work issues- is accepting that for now  Weight is up 5 lb   Also cutting back smoking - is not easy , 2-4 per day now - getting better Is eating more   Is sleeping much better now -- and does not wake up as often  Needs to further cut caffeine  Flu shot today  Patient Active Problem List  Diagnosis  . MALIGNANT NEOPLASM OF SUPRAGLOTTIS  . HYPERLIPIDEMIA  . THROMBOCYTOPENIA  . TOBACCO ABUSE  . COPD  . HOARSENESS, CHRONIC  . COLONIC POLYPS, HX OF  . PROSTATITIS, HX OF  . Prostate cancer screening  . Osteopenia  . Anxiety   Past Medical History  Diagnosis Date  . COPD (chronic obstructive pulmonary disease)   . Hyperlipemia   . Hx of colonic polyps   . 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 History of ITP   . Smoker   . Osteopenia     ?  Marland Kitchen Osteoporosis     dexa 06, declines further dexa or tx    Past Surgical History  Procedure Date  . Cataract extraction   . Hemorrhoid surgery   . Splenectomy   . Bone marrow biopsy 05/2003  . Cholecystectomy   . Dexa     per patient ? osteopenia   History  Substance Use Topics  . Smoking status: Current Every Day Smoker -- 0.3 packs/day  . Smokeless tobacco: Not on file  . Alcohol Use: No   Family History  Problem Relation Age of Onset  . Cancer Mother     uterine  . Heart disease Sister     some heart trouble   No Known Allergies Current Outpatient Prescriptions on File Prior to Visit  Medication Sig Dispense Refill  . CALCIUM CARBONATE PO Take by mouth daily.      . cholecalciferol (VITAMIN D) 1000 UNITS tablet Take 1,000 Units by mouth daily.      . sertraline (ZOLOFT) 100 MG tablet Take 1 tablet (100 mg total) by mouth  daily.  30 tablet  3      Review of Systems Review of Systems  Constitutional: Negative for fever, appetite change, fatigue and unexpected weight change.  Eyes: Negative for pain and visual disturbance.  Respiratory: Negative for cough and shortness of breath.   Cardiovascular: Negative for cp or palpitations    Gastrointestinal: Negative for nausea, diarrhea and constipation.  Genitourinary: Negative for urgency and frequency.  Skin: Negative for pallor or rash   Neurological: Negative for weakness, light-headedness, numbness and headaches.  Hematological: Negative for adenopathy. Does not bruise/bleed easily.  Psychiatric/Behavioral: Negative for dysphoric mood. Anxiety is much improved        Objective:   Physical Exam  Constitutional: He appears well-developed and well-nourished. No distress.       Very thin elderly male  HENT:  Head: Normocephalic and atraumatic.  Mouth/Throat: Oropharynx is clear and moist.  Eyes: Conjunctivae normal and EOM are normal. Pupils are equal, round, and reactive to light. No scleral icterus.  Neck: Normal range of motion. Neck supple. No JVD present. No thyromegaly present.  Cardiovascular: Normal  rate and regular rhythm.   Pulmonary/Chest: Effort normal and breath sounds normal. No respiratory distress. He has no wheezes.       Diffusely distant bs   Abdominal: Soft. Bowel sounds are normal. He exhibits no distension. There is no tenderness.  Musculoskeletal: He exhibits no edema.  Lymphadenopathy:    He has no cervical adenopathy.  Neurological: He is alert. He has normal reflexes. He displays tremor. No cranial nerve deficit. He exhibits normal muscle tone. Coordination normal.       Very slight bilateral hand tremor   Skin: Skin is warm and dry. No rash noted. No pallor.  Psychiatric: He has a normal mood and affect.       Much improved affect today- not seemingly depressed or anxious Is attentive and talkative Disc stressors freely           Assessment & Plan:

## 2012-03-07 NOTE — Assessment & Plan Note (Signed)
Much improved with zoloft 50 at this time- sleep and appetite better also Is coping better with stressors Good support  Will continue to monitor

## 2012-03-07 NOTE — Patient Instructions (Addendum)
I'm so glad you are feeling better  Update me if your anxiety gets worse or you have any other problems You had your flu shot today

## 2012-04-10 ENCOUNTER — Telehealth: Payer: Self-pay

## 2012-04-10 MED ORDER — ZOSTER VACCINE LIVE 19400 UNT/0.65ML ~~LOC~~ SOLR: 0.6500 mL | Freq: Once | SUBCUTANEOUS | Status: DC

## 2012-04-10 NOTE — Telephone Encounter (Signed)
Advise pt's wife Rx ready for pickup

## 2012-04-10 NOTE — Telephone Encounter (Signed)
Barbara  request rx for shingles vaccine for pt. Has checked with insurance and will cover at pharmacy. She will pick up on 04/12/12. Barbara request call back.Please advise.

## 2012-04-10 NOTE — Telephone Encounter (Signed)
Px printed for pick up in IN box  

## 2012-04-13 ENCOUNTER — Encounter: Payer: Self-pay | Admitting: Family Medicine

## 2012-04-13 ENCOUNTER — Ambulatory Visit (INDEPENDENT_AMBULATORY_CARE_PROVIDER_SITE_OTHER): Payer: Medicare Other | Admitting: Family Medicine

## 2012-04-13 VITALS — BP 124/74 | HR 76 | Temp 97.6°F | Ht 67.0 in | Wt 128.0 lb

## 2012-04-13 DIAGNOSIS — S0180XA Unspecified open wound of other part of head, initial encounter: Secondary | ICD-10-CM

## 2012-04-13 DIAGNOSIS — S0181XA Laceration without foreign body of other part of head, initial encounter: Secondary | ICD-10-CM

## 2012-04-13 NOTE — Patient Instructions (Addendum)
Keep your wound clean and dry  The steri strips will come off when they are ready  If any redness or drainage - let me know  If headache or vision change / confusion/ nausea or other symptoms - please alert [place] or seek care

## 2012-04-13 NOTE — Assessment & Plan Note (Signed)
Healed well without concussive symptoms and no s/s of infx Had Tdap with his sutures 5 simple inturrupted sutures removed today from R forehead Used 4 steri strips After care inst given Disc symptoms of head injury to watch for - reassuring exam today

## 2012-04-13 NOTE — Progress Notes (Signed)
Subjective:    Patient ID: Daniel Chapman, male    DOB: [birth year], 78 y.o.   MRN: [id number]  HPI Saturday - tripped and fell over some cement  Hit his forehead on the cement  Bled a lot Went to [facility] ER - which was close  Put in 5 sutures  Hurt for the first day or 2 Now soreness is almost gone  No redness or swelling  He refused CT in the ER  No ha or vomiting or personality change   Patient Active Problem List  Diagnosis  . MALIGNANT NEOPLASM OF SUPRAGLOTTIS  . HYPERLIPIDEMIA  . THROMBOCYTOPENIA  . TOBACCO ABUSE  . COPD  . HOARSENESS, CHRONIC  . COLONIC POLYPS, HX OF  . PROSTATITIS, HX OF  . Prostate cancer screening  . Osteopenia  . Anxiety   Past Medical History  Diagnosis Date  . COPD (chronic obstructive pulmonary disease)   . Hyperlipemia   . Hx of colonic polyps   . 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 History of ITP   . Smoker   . Osteopenia     ?  Marland Kitchen Osteoporosis     dexa 06, declines further dexa or tx    Past Surgical History  Procedure Date  . Cataract extraction   . Hemorrhoid surgery   . Splenectomy   . Bone marrow biopsy 05/2003  . Cholecystectomy   . Dexa     per patient ? osteopenia   History  Substance Use Topics  . Smoking status: Current Every Day Smoker -- 0.3 packs/day  . Smokeless tobacco: Not on file  . Alcohol Use: No   Family History  Problem Relation Age of Onset  . Cancer Mother     uterine  . Heart disease Sister     some heart trouble   No Known Allergies Current Outpatient Prescriptions on File Prior to Visit  Medication Sig Dispense Refill  . CALCIUM CARBONATE PO Take by mouth daily.      . cholecalciferol (VITAMIN D) 1000 UNITS tablet Take 1,000 Units by mouth daily.      . sertraline (ZOLOFT) 100 MG tablet Take 0.5 tablets (50 mg total) by mouth daily.  45 tablet  3      Review of Systems Review of Systems  Constitutional: Negative for fever, appetite change, fatigue and unexpected  weight change.  Eyes: Negative for pain and visual disturbance.  Respiratory: Negative for cough and shortness of breath.   Cardiovascular: Negative for cp or palpitations    Gastrointestinal: Negative for nausea, diarrhea and constipation.  Genitourinary: Negative for urgency and frequency.  Skin: Negative for pallor or rash   Neurological: Negative for weakness, light-headedness, numbness and headaches.  Hematological: Negative for adenopathy. Does not bruise/bleed easily.  Psychiatric/Behavioral: Negative for dysphoric mood. The patient is not nervous/anxious.         Objective:   Physical Exam  Constitutional: He appears well-developed and well-nourished. No distress.  HENT:  Head: Normocephalic.  Nose: Nose normal.  Mouth/Throat: Oropharynx is clear and moist.       Suprisingly no ecchymosis on face Laceration is on R forehead  No bony facial tenderness  Eyes: Conjunctivae normal and EOM are normal. Pupils are equal, round, and reactive to light. Right eye exhibits no discharge. Left eye exhibits no discharge.  Neck: Normal range of motion. Neck supple.  Musculoskeletal: He exhibits no edema and no tenderness.  Neurological: He is alert. He has normal  reflexes. No cranial nerve deficit. He exhibits normal muscle tone. Coordination normal.  Skin: Skin is warm and dry. No rash noted. No erythema. No pallor.       Laceration on R forehead with 5 simple interrupted sutures-some scabbing and wound edges well approx These were removed and 4 steri strips applied   Psychiatric: He has a normal mood and affect.       His usual boisterous self today Acting normally          Assessment & Plan:

## 2012-08-01 ENCOUNTER — Encounter: Payer: Self-pay | Admitting: Family Medicine

## 2012-08-01 ENCOUNTER — Ambulatory Visit (INDEPENDENT_AMBULATORY_CARE_PROVIDER_SITE_OTHER): Payer: Medicare Other | Admitting: Family Medicine

## 2012-08-01 VITALS — BP 142/80 | HR 94 | Temp 97.9°F | Ht 67.0 in | Wt 130.2 lb

## 2012-08-01 DIAGNOSIS — D696 Thrombocytopenia, unspecified: Secondary | ICD-10-CM

## 2012-08-01 DIAGNOSIS — F17200 Nicotine dependence, unspecified, uncomplicated: Secondary | ICD-10-CM

## 2012-08-01 DIAGNOSIS — M899 Disorder of bone, unspecified: Secondary | ICD-10-CM

## 2012-08-01 DIAGNOSIS — E785 Hyperlipidemia, unspecified: Secondary | ICD-10-CM

## 2012-08-01 DIAGNOSIS — R0989 Other specified symptoms and signs involving the circulatory and respiratory systems: Secondary | ICD-10-CM

## 2012-08-01 DIAGNOSIS — M8580 Other specified disorders of bone density and structure, unspecified site: Secondary | ICD-10-CM

## 2012-08-01 LAB — CBC WITH DIFFERENTIAL/PLATELET
Basophils Absolute: 0 10*3/uL (ref 0.0–0.1)
Eosinophils Absolute: 0.1 10*3/uL (ref 0.0–0.7)
Hemoglobin: 14 g/dL (ref 13.0–17.0)
Lymphocytes Relative: 31.3 % (ref 12.0–46.0)
MCHC: 33.2 g/dL (ref 30.0–36.0)
Neutro Abs: 4.9 10*3/uL (ref 1.4–7.7)
Neutrophils Relative %: 55 % (ref 43.0–77.0)
Platelets: 143 10*3/uL — ABNORMAL LOW (ref 150.0–400.0)
RDW: 13.4 % (ref 11.5–14.6)

## 2012-08-01 LAB — COMPREHENSIVE METABOLIC PANEL
ALT: 13 U/L (ref 0–53)
AST: 19 U/L (ref 0–37)
Calcium: 9.1 mg/dL (ref 8.4–10.5)
Chloride: 103 mEq/L (ref 96–112)
Creatinine, Ser: 0.9 mg/dL (ref 0.4–1.5)
Total Bilirubin: 0.4 mg/dL (ref 0.3–1.2)

## 2012-08-01 LAB — LIPID PANEL
HDL: 25.5 mg/dL — ABNORMAL LOW (ref >39.00)
Total CHOL/HDL Ratio: 6
VLDL: 34.8 mg/dL (ref 0.0–40.0)

## 2012-08-01 NOTE — Progress Notes (Signed)
Subjective:    Patient ID: Daniel Chapman, male    DOB: Nov 14, 1931, 79 y.o.   MRN: [id number]  HPI Here for check up of chronic medical conditions and to review health mt list   Nothing new is going   Wt is up 2 lb Per wife-not exercising enough He disagrees  Eats what he wants to -not watching any particular things   Is due labs Last year sugar in 120s- but not fasting -and not fasting today-ate oatmeal and coffee   Less libido these days with age - this bothers him somewhat  Zoster status - had his vaccine - and he paid 90$ for it  Not on prednisone - that is good news   colonosc 1/10 Hx of polyps   Falls- stumbled and fell once and lacerated forehead (he hit a rock with his ice buggy)-no broken bones  Balance is overall pretty good and he is not worried about it at all   Mood -on zoloft- has a lot of stress  He lost his dog to an accident -that was hard , but overall doing ok   Osteopenia in past - hx of prednisone and smoking Pt declines further dexa or tx   Smoking status- about the same -no plans to quit yet - he thinks about it all the time and wants to quit someday   No prostate issues - he urinates without any problems occ nocturia -not often Lab Results  Component Value Date   PSA 0.48 07/11/2011   PSA 0.41 05/12/2010   PSA 0.51 05/08/2009      Patient Active Problem List  Diagnosis  . MALIGNANT NEOPLASM OF SUPRAGLOTTIS  . HYPERLIPIDEMIA  . THROMBOCYTOPENIA  . TOBACCO ABUSE  . COPD  . HOARSENESS, CHRONIC  . COLONIC POLYPS, HX OF  . PROSTATITIS, HX OF  . Prostate cancer screening  . Osteopenia  . Anxiety  . Bruit of left carotid artery   Past Medical History  Diagnosis Date  . COPD (chronic obstructive pulmonary disease)   . Hyperlipemia   . Hx of colonic polyps   . 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 History of ITP   . Smoker   . Osteopenia     ?  Marland Kitchen Osteoporosis     dexa 06, declines further dexa or tx    Past  Surgical History  Procedure Laterality Date  . Cataract extraction    . Hemorrhoid surgery    . Splenectomy    . Bone marrow biopsy  05/2003  . Cholecystectomy    . Dexa      per patient ? osteopenia   History  Substance Use Topics  . Smoking status: Current Every Day Smoker -- 0.30 packs/day  . Smokeless tobacco: Not on file  . Alcohol Use: No   Family History  Problem Relation Age of Onset  . Cancer Mother     uterine  . Heart disease Sister     some heart trouble   No Known Allergies Current Outpatient Prescriptions on File Prior to Visit  Medication Sig Dispense Refill  . CALCIUM CARBONATE PO Take by mouth daily. Tum      . cholecalciferol (VITAMIN D) 1000 UNITS tablet Take 1,000 Units by mouth daily.      . sertraline (ZOLOFT) 100 MG tablet Take 0.5 tablets (50 mg total) by mouth daily.  45 tablet  3   No current facility-administered medications on file prior to visit.  Review of Systems Review of Systems  Constitutional: Negative for fever, appetite change, fatigue and unexpected weight change.  Eyes: Negative for pain and visual disturbance.  Respiratory: Negative for cough and shortness of breath.   Cardiovascular: Negative for cp or palpitations    Gastrointestinal: Negative for nausea, diarrhea and constipation.  Genitourinary: Negative for urgency and frequency. pos for loss of libido Skin: Negative for pallor or rash   Neurological: Negative for weakness, light-headedness, numbness and headaches.  Hematological: Negative for adenopathy. Does not bruise/bleed easily.  Psychiatric/Behavioral: Negative for dysphoric mood. The patient is not nervous/anxious.         Objective:   Physical Exam  Constitutional: He appears well-developed and well-nourished. No distress.  Well appearing elderly male  HENT:  Head: Normocephalic and atraumatic.  Right Ear: External ear normal.  Left Ear: External ear normal.  Nose: Nose normal.  Mouth/Throat: Oropharynx is  clear and moist.  Eyes: Conjunctivae and EOM are normal. Pupils are equal, round, and reactive to light. Right eye exhibits no discharge. Left eye exhibits no discharge. No scleral icterus.  Neck: Normal range of motion. Neck supple. No JVD present. Carotid bruit is present. No thyromegaly present.  Left carotid bruit noted   Baseline hoarsness  Cardiovascular: Normal rate, regular rhythm, normal heart sounds and intact distal pulses.  Exam reveals no gallop.   Pulmonary/Chest: Effort normal and breath sounds normal. No respiratory distress. He has no wheezes.  Abdominal: Soft. Bowel sounds are normal. He exhibits no distension and no mass. There is no tenderness.  Musculoskeletal: Normal range of motion. He exhibits no edema and no tenderness.  Lymphadenopathy:    He has no cervical adenopathy.  Neurological: He is alert. He has normal reflexes. He displays no tremor. No cranial nerve deficit. He exhibits normal muscle tone. Coordination normal.  Skin: Skin is warm and dry. No rash noted. No erythema. No pallor.  Psychiatric: He has a normal mood and affect.  Somewhat odd and boisterous personality is unchanged          Assessment & Plan:

## 2012-08-01 NOTE — Patient Instructions (Addendum)
Labs today  We will schedule an ultrasound of your neck at check out - talk to [facility]  Stay active and eat a healthy diet

## 2012-08-02 LAB — VITAMIN D 25 HYDROXY (VIT D DEFICIENCY, FRACTURES): Vit D, 25-Hydroxy: 59 ng/mL (ref 30–89)

## 2012-08-02 NOTE — Assessment & Plan Note (Signed)
Disc in detail risks of smoking and possible outcomes including copd, vascular/ heart disease, cancer , respiratory and sinus infections  Pt voices understanding Pt already has copd and has had throat  Cancer - but still not ready to quit Urged him to keep thinking about it

## 2012-08-02 NOTE — Assessment & Plan Note (Signed)
Lab today Pt eats what he wants - will not likely change his diet

## 2012-08-02 NOTE — Assessment & Plan Note (Signed)
Improved since splenectomy No longer on prednisone  Will continue to monitor

## 2012-08-02 NOTE — Assessment & Plan Note (Signed)
This is new -in pt who smokes No hx of cva or tia in past  Hx of thrombocytopenia  Carotid doppler ordered

## 2012-08-02 NOTE — Assessment & Plan Note (Signed)
Pt declines further dexa at this time Enc him to take ca and D

## 2012-08-10 ENCOUNTER — Encounter (INDEPENDENT_AMBULATORY_CARE_PROVIDER_SITE_OTHER): Payer: Medicare Other

## 2012-08-10 DIAGNOSIS — I6529 Occlusion and stenosis of unspecified carotid artery: Secondary | ICD-10-CM

## 2012-08-10 DIAGNOSIS — R0989 Other specified symptoms and signs involving the circulatory and respiratory systems: Secondary | ICD-10-CM

## 2012-08-14 ENCOUNTER — Encounter: Payer: Self-pay | Admitting: Family Medicine

## 2012-08-14 DIAGNOSIS — I6529 Occlusion and stenosis of unspecified carotid artery: Secondary | ICD-10-CM | POA: Insufficient documentation

## 2012-08-15 ENCOUNTER — Telehealth: Payer: Self-pay | Admitting: Family Medicine

## 2012-08-15 DIAGNOSIS — I6523 Occlusion and stenosis of bilateral carotid arteries: Secondary | ICD-10-CM

## 2012-08-15 NOTE — Telephone Encounter (Signed)
Message copied by Judy Pimple on Wed Aug 15, 2012  1:11 PM ------      Message from: Shon Millet      Created: Wed Aug 15, 2012 11:49 AM       Pt notified of results and agrees with referral, I advise Shirlee Limerick will call to set up appt ------

## 2012-08-15 NOTE — Telephone Encounter (Signed)
Ref to vasc

## 2012-08-16 ENCOUNTER — Other Ambulatory Visit: Payer: Self-pay

## 2012-08-27 ENCOUNTER — Encounter: Payer: Self-pay | Admitting: Vascular Surgery

## 2012-08-28 ENCOUNTER — Ambulatory Visit (INDEPENDENT_AMBULATORY_CARE_PROVIDER_SITE_OTHER): Payer: Medicare Other | Admitting: Vascular Surgery

## 2012-08-28 ENCOUNTER — Encounter: Payer: Self-pay | Admitting: Vascular Surgery

## 2012-08-28 DIAGNOSIS — I6529 Occlusion and stenosis of unspecified carotid artery: Secondary | ICD-10-CM | POA: Insufficient documentation

## 2012-08-28 NOTE — Progress Notes (Signed)
[facility] [place]   Patient name: Daniel Chapman MRN: [id number] DOB: 1931-08-05 Sex: male   Referred by: [facility]  Reason for referral:  Chief Complaint  Patient presents with  . New Evaluation  . Carotid    HISTORY OF PRESENT ILLNESS: The patient is an active 79 year old gentleman who was found to have a carotid bruit and underwent a duplex for further evaluation of this. I have his study for review from 08/10/2012. [facility] 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 He is left-handed. He specifically denies any prior hemispheric symptoms. Specifically denies site weakness amaurosis fugax or any aphasia. He does have long history of cigarette smoking.  Past Medical History  Diagnosis Date  . COPD (chronic obstructive pulmonary disease)   . Hyperlipemia   . Hx of colonic polyps   . 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 History of ITP   . Smoker   . Osteopenia     ?  Marland Kitchen Osteoporosis     dexa 06, declines further dexa or tx     Past Surgical History  Procedure Laterality Date  . Cataract extraction    . Hemorrhoid surgery    . Splenectomy    . Bone marrow biopsy  05/2003  . Dexa      per patient ? osteopenia  . Tonsillectomy and adenoidectomy Bilateral age 45 per patient    History   Social History  . Marital Status: Married    Spouse Name: N/A    Number of Children: 6  . Years of Education: N/A   Occupational History  . [employer]    Social History Main Topics  . Smoking status: Current Every Day Smoker -- 0.50 packs/day    Types: Cigarettes  . Smokeless tobacco: Not on file  . Alcohol Use: No  . Drug Use: No  . Sexually Active: Not on file   Other Topics Concern  . Not on file   Social History Narrative  . No narrative on file    Family History  Problem Relation Age of Onset  . Cancer Mother      uterine  . Heart disease Sister     some heart trouble    Allergies as of 08/28/2012  . (No Known Allergies)    Current Outpatient Prescriptions on File Prior to Visit  Medication Sig Dispense Refill  . CALCIUM CARBONATE PO Take by mouth daily. Tum      . cholecalciferol (VITAMIN D) 1000 UNITS tablet Take 1,000 Units by mouth daily.      . sertraline (ZOLOFT) 100 MG tablet Take 0.5 tablets (50 mg total) by mouth daily.  45 tablet  3   No current facility-administered medications on file prior to visit.     REVIEW OF SYSTEMS:  Positives indicated with an "X"  CARDIOVASCULAR:  [ ]  chest pain   [ ]  chest pressure   [ ]  palpitations   [ ]  orthopnea   [ ]  dyspnea on exertion   [ ]  claudication   [ ]  rest pain   [ ]  DVT   [ ]  phlebitis PULMONARY:   [ ]  productive cough   [ ]  asthma   [ ]  wheezing NEUROLOGIC:   [ ]  weakness  [ ]  paresthesias  [ ]  aphasia  [ ]  amaurosis  [ ]  dizziness HEMATOLOGIC:   [ ]   bleeding problems   [ ]  clotting disorders MUSCULOSKELETAL:  [ ]  joint pain   [ ]  joint swelling GASTROINTESTINAL: [ ]   blood in stool  [ ]   hematemesis GENITOURINARY:  [ ]   dysuria  [ ]   hematuria PSYCHIATRIC:  [ ]  history of major depression INTEGUMENTARY:  [ ]  rashes  [ ]  ulcers CONSTITUTIONAL:  [ ]  fever   [ ]  chills  PHYSICAL EXAMINATION:  General: The patient is a well-nourished male, in no acute distress. Vital signs are BP 141/83  Pulse 67  Resp 18  Ht 5\' 8"  (1.727 m)  Wt 129 lb (58.514 kg)  BMI 19.62 kg/m2 Pulmonary: There is a good air exchange bilaterally without wheezing or rales. Abdomen: Soft and non-tender with normal pitch bowel sounds. Musculoskeletal: There are no major deformities.  There is no significant extremity pain. Neurologic: No focal weakness or paresthesias are detected, Skin: There are no ulcer or rashes noted. Psychiatric: The patient has normal affect. Cardiovascular: There is a regular rate and rhythm without significant murmur  appreciated. Pulse status: 2+ radial 2+ femoral and 2+ dorsalis pedis pulses bilaterally Carotid arteries with normal pulses bilaterally. He does have a soft bruit on the left than on the right   Vascular Lab Studies:  Right internal carotid occlusion, left 40-59% stenosis  Impression and Plan:  Asymptomatic right internal carotid artery occlusion and moderate left internal carotid artery stenosis. I had a long discussion with the patient and his wife present. I explained the inability to treat occluded internal carotid artery since it is occluded up into his brain. Also explained with no symptoms related to this and he does have adequate cross fill from left to right. In reviewing his duplex of his left carotid artery his velocities are certainly at the lower end of the 40-59% stenosis range. Also explained that frequently contralateral occlusion will cause elevated velocities even in the absence of stenosis. I reviewed symptoms of carotid disease with him and his wife and they will notify should this occur. Otherwise we'll see him again in one year with repeat carotid duplex and office followup. Did discuss a critical importance of smoking cessation to reduce his risk for atherosclerotic changes.    Kemaya Dorner [facility] [place] Office: [phone]

## 2012-08-28 NOTE — Addendum Note (Signed)
Addended by: Adria Dill L on: 08/28/2012 04:38 PM   Modules accepted: Orders

## 2012-09-17 ENCOUNTER — Other Ambulatory Visit: Payer: Self-pay

## 2012-09-17 MED ORDER — SERTRALINE HCL 100 MG PO TABS: 50.0000 mg | ORAL_TABLET | Freq: Every day | ORAL | Status: DC

## 2012-09-17 NOTE — Telephone Encounter (Signed)
pts wife request sertraline refill # 45 x 0 sent to [facility]; pt is not set up with mailorder but wife will take care of that and does not want to pick up written rx. Advised wife done.

## 2012-12-21 ENCOUNTER — Other Ambulatory Visit: Payer: Self-pay

## 2012-12-21 MED ORDER — SERTRALINE HCL 100 MG PO TABS: 50.0000 mg | ORAL_TABLET | Freq: Every day | ORAL | Status: DC

## 2012-12-21 NOTE — Telephone Encounter (Signed)
Daniel Chapman request refill sertraline to [facility]; advised done.

## 2013-02-14 ENCOUNTER — Telehealth: Payer: Self-pay

## 2013-02-14 NOTE — Telephone Encounter (Signed)
Mrs Isip left v/m that pt got a call today that pt needed to schedule carotid study as 6 mth f/u. Mrs Ruark said when saw Dr Arbie Cookey in April 2014 was advised to reck Carotid study and f/u with Dr Early in one year. Mrs Pilz wants to know Dr Royden Purl suggestion of what to do and wants to know if insurance will cover test done in 6 months instead of one year?Please advise.

## 2013-02-14 NOTE — Telephone Encounter (Signed)
I recommend they call Dr Bosie Helper office to verify when he needs it

## 2013-02-14 NOTE — Telephone Encounter (Signed)
Called home and someone advise me Mrs. More wasn't available to come to the phone, will try to call back later

## 2013-02-15 NOTE — Telephone Encounter (Signed)
Left voicemail requesting pt to call office 

## 2013-02-18 NOTE — Telephone Encounter (Signed)
Mrs Persing notified as instructed and she will contact Dr Bosie Helper office.

## 2013-03-13 ENCOUNTER — Other Ambulatory Visit: Payer: Self-pay

## 2013-03-13 NOTE — Telephone Encounter (Signed)
Barbara request refill sertraline to [facility]; pt last seen 08/01/12.Please advise.

## 2013-03-13 NOTE — Telephone Encounter (Signed)
Please refill for 6 mo -thanks 

## 2013-03-14 ENCOUNTER — Ambulatory Visit (INDEPENDENT_AMBULATORY_CARE_PROVIDER_SITE_OTHER): Payer: Medicare Other

## 2013-03-14 DIAGNOSIS — Z23 Encounter for immunization: Secondary | ICD-10-CM

## 2013-03-14 MED ORDER — SERTRALINE HCL 100 MG PO TABS: 50.0000 mg | ORAL_TABLET | Freq: Every day | ORAL | Status: DC

## 2013-03-14 NOTE — Telephone Encounter (Signed)
DONE

## 2013-06-28 ENCOUNTER — Other Ambulatory Visit: Payer: Self-pay | Admitting: Vascular Surgery

## 2013-06-28 DIAGNOSIS — I6529 Occlusion and stenosis of unspecified carotid artery: Secondary | ICD-10-CM

## 2013-07-20 DIAGNOSIS — Y92009 Unspecified place in unspecified non-institutional (private) residence as the place of occurrence of the external cause: Secondary | ICD-10-CM | POA: Insufficient documentation

## 2013-07-20 DIAGNOSIS — W19XXXA Unspecified fall, initial encounter: Secondary | ICD-10-CM | POA: Insufficient documentation

## 2013-07-20 DIAGNOSIS — Z8781 Personal history of (healed) traumatic fracture: Secondary | ICD-10-CM | POA: Insufficient documentation

## 2013-07-21 DIAGNOSIS — W009XXA Unspecified fall due to ice and snow, initial encounter: Secondary | ICD-10-CM

## 2013-07-21 HISTORY — PX: JOINT REPLACEMENT: SHX530

## 2013-07-21 HISTORY — DX: Unspecified fall due to ice and snow, initial encounter: W00.9XXA

## 2013-07-23 ENCOUNTER — Telehealth: Payer: Self-pay | Admitting: Family Medicine

## 2013-07-23 NOTE — Telephone Encounter (Signed)
I'm sorry to hear that- thanks so much for letting me know-please let us know when he is d/c from the hospital so we can send for a d/c summary

## 2013-07-23 NOTE — Telephone Encounter (Signed)
Patient's wife called and said patient is in [facility].  Patient fell on Saturday and broke his hip.  He had surgery on Sunday morning.  Patient is doing ok.  Patient will be going for Rehab after he's discharged from the Hospital.

## 2013-07-30 ENCOUNTER — Encounter: Payer: Self-pay | Admitting: Adult Health

## 2013-08-02 ENCOUNTER — Non-Acute Institutional Stay (SKILLED_NURSING_FACILITY): Payer: Commercial Managed Care - HMO | Admitting: Internal Medicine

## 2013-08-02 ENCOUNTER — Encounter: Payer: Self-pay | Admitting: *Deleted

## 2013-08-02 ENCOUNTER — Encounter: Payer: Self-pay | Admitting: Internal Medicine

## 2013-08-02 DIAGNOSIS — M949 Disorder of cartilage, unspecified: Secondary | ICD-10-CM

## 2013-08-02 DIAGNOSIS — F419 Anxiety disorder, unspecified: Secondary | ICD-10-CM

## 2013-08-02 DIAGNOSIS — Z9089 Acquired absence of other organs: Secondary | ICD-10-CM

## 2013-08-02 DIAGNOSIS — M8580 Other specified disorders of bone density and structure, unspecified site: Secondary | ICD-10-CM

## 2013-08-02 DIAGNOSIS — J4489 Other specified chronic obstructive pulmonary disease: Secondary | ICD-10-CM

## 2013-08-02 DIAGNOSIS — Z9081 Acquired absence of spleen: Secondary | ICD-10-CM

## 2013-08-02 DIAGNOSIS — J449 Chronic obstructive pulmonary disease, unspecified: Secondary | ICD-10-CM

## 2013-08-02 DIAGNOSIS — F411 Generalized anxiety disorder: Secondary | ICD-10-CM

## 2013-08-02 DIAGNOSIS — M899 Disorder of bone, unspecified: Secondary | ICD-10-CM

## 2013-08-02 DIAGNOSIS — F17200 Nicotine dependence, unspecified, uncomplicated: Secondary | ICD-10-CM

## 2013-08-02 DIAGNOSIS — Z96649 Presence of unspecified artificial hip joint: Secondary | ICD-10-CM

## 2013-08-02 NOTE — Progress Notes (Signed)
MRN: [id number] Name: Daniel Chapman  Sex: male Age: 80 y.o. DOB: April 10, 1932  [place] #: [place] Facility/Room: 1206 Level Of Care: SNF Provider: Inocencio Homes D Emergency Contacts: Extended Emergency Contact Information Primary Emergency Contact: Craw,Barbara Address: [address]          [place], [place] Phone: [phone] Relation: Spouse Secondary Emergency Contact: Komsa,Cindy Address: [address]          [place], [place] [address] Johnnette Litter of [place] Phone: [phone] Mobile Phone: [phone] Relation: Daughter     Allergies: Review of patient's allergies indicates no known allergies.  Chief Complaint  Patient presents with  . nursing home admission    HPI: Patient is 80 y.o. male who is admitted to SNF for OT/PT after R hip arthroplasty.  Past Medical History  Diagnosis Date  . COPD (chronic obstructive pulmonary disease)   . Hyperlipemia   . Hx of colonic polyps   . 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 History of ITP   . Smoker   . Osteopenia     ?  Marland Kitchen Osteoporosis     dexa 06, declines further dexa or tx     Past Surgical History  Procedure Laterality Date  . Cataract extraction    . Hemorrhoid surgery    . Splenectomy    . Bone marrow biopsy  05/2003  . Dexa      per patient ? osteopenia  . Tonsillectomy and adenoidectomy Bilateral age 73 per patient      Medication List       This list is accurate as of: 08/02/13 11:21 PM.  Always use your most recent med list.               acetaminophen 650 MG CR tablet  Commonly known as:  TYLENOL  Take 650 mg by mouth every 8 (eight) hours as needed. For fever >101.5     CALCIUM CARBONATE PO  Take 1 tablet by mouth daily. Tum     cholecalciferol 1000 UNITS tablet  Commonly known as:  VITAMIN D  Take 1,000 Units by mouth daily.     DULCOLAX 10 MG suppository  Generic drug:  bisacodyl  Place 10 mg rectally as needed for mild  constipation or moderate constipation.     enoxaparin 40 MG/0.4ML injection  Commonly known as:  LOVENOX  Inject 40 mg into the skin daily. For DVT/Prophylaxis     HYDROcodone-acetaminophen 5-325 MG per tablet  Commonly known as:  NORCO/VICODIN  Take 1 tablet by mouth every 4 (four) hours as needed for moderate pain.     nicotine 14 mg/24hr patch  Commonly known as:  NICODERM CQ - dosed in mg/24 hours  Place 14 mg onto the skin daily. For smoking cessation     polyethylene glycol packet  Commonly known as:  MIRALAX / GLYCOLAX  Take 17 g by mouth daily. Mix 6- 8 oz of liquid daily for constipation.     sertraline 100 MG tablet  Commonly known as:  ZOLOFT  Take 50 mg by mouth daily. For depression        No orders of the defined types were placed in this encounter.    Immunization History  Administered Date(s) Administered  . Influenza Split 04/06/2011, 03/07/2012  . Influenza Whole 11/26/2004, 04/06/2007, 03/27/2008, 05/08/2009, 03/09/2010  . Influenza,inj,Quad PF,36+ Mos 03/14/2013  . PPD Test 07/24/2013  . Pneumococcal Polysaccharide-23 04/30/2003, 04/06/2007  .  Td 09/28/2002    History  Substance Use Topics  . Smoking status: Current Every Day Smoker -- 0.50 packs/day    Types: Cigarettes  . Smokeless tobacco: Not on file  . Alcohol Use: No    Family history is noncontributory    Review of Systems  DATA OBTAINED: from patient GENERAL: Feels well no fevers, fatigue, appetite changes SKIN: No itching, rash or wounds EYES: No eye pain, redness, discharge EARS: No earache, tinnitus, change in hearing NOSE: No congestion, drainage or bleeding  MOUTH/THROAT: No mouth or tooth pain, No sore throat RESPIRATORY: No cough, wheezing, SOB CARDIAC: No chest pain, palpitations, lower extremity edema  GI: No abdominal pain, No N/V/D or constipation, No heartburn or reflux  GU: No dysuria, frequency or urgency, or incontinence  MUSCULOSKELETAL: No unrelieved bone/joint  pain NEUROLOGIC: No headache, dizziness or focal weakness PSYCHIATRIC: No overt anxiety or sadness. Sleeps well. No behavior issue.   Filed Vitals:   08/02/13 2223  BP: 119/71  Pulse: 78  Temp: 98.2 F (36.8 C)  Resp: 20    Physical Exam  GENERAL APPEARANCE: Alert, conversant. Appropriately groomed. No acute distress.  SKIN: No diaphoresis rash HEAD: Normocephalic, atraumatic  EYES: Conjunctiva/lids clear. Pupils round, reactive. EOMs intact.  EARS: External exam WNL, canals clear. Hearing grossly normal.  NOSE: No deformity or discharge.  MOUTH/THROAT: Lips w/o lesions  RESPIRATORY: Breathing is even, unlabored. Lung sounds are decreased, no wheezing   CARDIOVASCULAR: Heart RRR no murmurs, rubs or gallops. No peripheral edema.  GASTROINTESTINAL: Abdomen is soft, non-tender, not distended w/ normal bowel sounds GENITOURINARY: Bladder non tender, not distended  MUSCULOSKELETAL: No abnormal joints or musculature NEUROLOGIC: Oriented X3. Cranial nerves 2-12 grossly intact. Moves all extremities no tremor. PSYCHIATRIC: Mood and affect appropriate to situation, no behavioral issues  Patient Active Problem List   Diagnosis Date Noted  . S/P total hip arthroplasty 08/02/2013  . Closed fracture of femur, neck 07/20/2013  . Occlusion and stenosis of carotid artery without mention of cerebral infarction 08/28/2012  . Carotid stenosis 08/14/2012  . Bruit of left carotid artery 08/01/2012  . Anxiety 01/27/2012  . Osteopenia 07/19/2011  . Prostate cancer screening 07/10/2011  . HOARSENESS, CHRONIC 02/05/2007  . 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 HYPERLIPIDEMIA 11/06/2006  . THROMBOCYTOPENIA 11/06/2006  . TOBACCO ABUSE 11/06/2006  . COPD 11/06/2006  . COLONIC POLYPS, HX OF 11/06/2006  . PROSTATITIS, HX OF 11/06/2006    CBC    Component Value Date/Time   WBC 8.8 08/01/2012 1032   WBC 7.6 09/16/2011 0942   RBC 4.53 08/01/2012 1032   RBC 4.32 09/16/2011 0942   HGB 14.0  08/01/2012 1032   HGB 13.6 09/16/2011 0942   HCT 42.2 08/01/2012 1032   HCT 41.1 09/16/2011 0942   PLT 143.0* 08/01/2012 1032   PLT 167 09/16/2011 0942   MCV 93.2 08/01/2012 1032   MCV 95.1 09/16/2011 0942   LYMPHSABS 2.8 08/01/2012 1032   LYMPHSABS 2.3 09/16/2011 0942   MONOABS 1.0 08/01/2012 1032   MONOABS 0.7 09/16/2011 0942   EOSABS 0.1 08/01/2012 1032   EOSABS 0.1 09/16/2011 0942   BASOSABS 0.0 08/01/2012 1032   BASOSABS 0.1 09/16/2011 0942    CMP     Component Value Date/Time   NA 137 08/01/2012 1032   K 4.3 08/01/2012 1032   CL 103 08/01/2012 1032   CO2 28 08/01/2012 1032   GLUCOSE 97 08/01/2012 1032   BUN 14 08/01/2012 1032   CREATININE 0.9 08/01/2012 1032  CALCIUM 9.1 08/01/2012 1032   PROT 7.4 08/01/2012 1032   ALBUMIN 3.5 08/01/2012 1032   AST 19 08/01/2012 1032   ALT 13 08/01/2012 1032   ALKPHOS 64 08/01/2012 1032   BILITOT 0.4 08/01/2012 1032   GFRNONAA 90.53 05/12/2010 1022   GFRAA 142 02/08/2008 1239    Assessment and Plan  S/P total hip arthroplasty Uneventful; SNF to remove staples 14 days post op 3/8; lovenox prophylaxis until 3/18  COPD No home meds, no SOB, no wheezing, still smoking;nicoderm patch  Anxiety Continue zoloft  Osteopenia Known and pt decline furhter scans;is on Ca and Vit D which will continue  TOBACCO ABUSE Counseling done;pt on nicoderm patch from 2/25 to 3/27    Hennie Duos, MD

## 2013-08-02 NOTE — Assessment & Plan Note (Addendum)
Uneventful; SNF to remove staples 14 days post op 3/8; lovenox prophylaxis until 3/18

## 2013-08-02 NOTE — Assessment & Plan Note (Signed)
-

## 2013-08-02 NOTE — Assessment & Plan Note (Signed)
Known and pt decline furhter scans;is on Ca and Vit D which will continue

## 2013-08-02 NOTE — Assessment & Plan Note (Signed)
No home meds, no SOB, no wheezing, still smoking;nicoderm patch

## 2013-08-02 NOTE — Assessment & Plan Note (Signed)
Counseling done;pt on nicoderm patch from 2/25 to 3/27

## 2013-08-05 ENCOUNTER — Encounter: Payer: Medicare Other | Admitting: Family Medicine

## 2013-08-09 ENCOUNTER — Other Ambulatory Visit: Payer: Self-pay

## 2013-08-09 ENCOUNTER — Telehealth: Payer: Self-pay

## 2013-08-09 MED ORDER — SERTRALINE HCL 100 MG PO TABS: 50.0000 mg | ORAL_TABLET | Freq: Every day | ORAL | Status: DC

## 2013-08-09 NOTE — Telephone Encounter (Signed)
Please ok that verbal request  

## 2013-08-09 NOTE — Telephone Encounter (Signed)
Caren Griffins nurse with [facility] left v/m requesting verbal order for 1 x a week for 3 weeks for skilled nursing and order for evaluation by PT and OT. Cynthia request cb.

## 2013-08-09 NOTE — Telephone Encounter (Signed)
Verbal orders given to Cynthia. 

## 2013-08-09 NOTE — Telephone Encounter (Signed)
Please refill for a year  

## 2013-08-09 NOTE — Telephone Encounter (Signed)
done

## 2013-08-09 NOTE — Telephone Encounter (Signed)
Mrs Colquhoun said pt returned home from nursing home on 08/08/13 and requested faxed rx to [facility] at fax # [phone].Please advise.

## 2013-08-14 ENCOUNTER — Telehealth: Payer: Self-pay | Admitting: Family Medicine

## 2013-08-14 NOTE — Telephone Encounter (Signed)
Left voicemail giving the verbal orders on [employer]

## 2013-08-14 NOTE — Telephone Encounter (Signed)
Sree PT with [facility] left voicemail. Maximino Greenland is requesting a verbal order for PT 2x a week for 2 week for pt

## 2013-08-14 NOTE — Telephone Encounter (Signed)
Please give that verbal ok  

## 2013-08-20 ENCOUNTER — Telehealth: Payer: Self-pay | Admitting: Family Medicine

## 2013-08-20 DIAGNOSIS — I6529 Occlusion and stenosis of unspecified carotid artery: Secondary | ICD-10-CM

## 2013-08-20 NOTE — Telephone Encounter (Signed)
No Pre cert required or referral required

## 2013-08-20 NOTE — Telephone Encounter (Signed)
Pt's wife left vm stating that a carotid study has been scheduled for pt at [facility] but they need referral before insurance will approve it.  Cb [phone]

## 2013-08-20 NOTE — Telephone Encounter (Signed)
Carotid doppler order ref

## 2013-09-02 ENCOUNTER — Encounter: Payer: Self-pay | Admitting: Family

## 2013-09-03 ENCOUNTER — Ambulatory Visit (HOSPITAL_COMMUNITY)
Admission: RE | Admit: 2013-09-03 | Discharge: 2013-09-03 | Disposition: A | Discharge status: Home / Self Care | Payer: Medicare HMO | Source: Ambulatory Visit | Attending: Vascular Surgery | Admitting: Vascular Surgery

## 2013-09-03 ENCOUNTER — Ambulatory Visit (INDEPENDENT_AMBULATORY_CARE_PROVIDER_SITE_OTHER): Payer: Commercial Managed Care - HMO | Admitting: Family

## 2013-09-03 ENCOUNTER — Encounter: Payer: Self-pay | Admitting: Family

## 2013-09-03 VITALS — BP 116/72 | HR 86 | Resp 16 | Ht 68.0 in | Wt 125.0 lb

## 2013-09-03 DIAGNOSIS — I6529 Occlusion and stenosis of unspecified carotid artery: Secondary | ICD-10-CM | POA: Diagnosis present

## 2013-09-03 DIAGNOSIS — R0989 Other specified symptoms and signs involving the circulatory and respiratory systems: Secondary | ICD-10-CM

## 2013-09-03 NOTE — Progress Notes (Signed)
Established Carotid Patient   History of Present Illness  Daniel Chapman is a 80 y.o. male patient who was referred to Daniel Chapman, and who was found to have a carotid bruit and underwent a duplex for further evaluation of this. Daniel Chapman [facility] 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 He is left-handed. He specifically denies any prior hemispheric symptoms. Specifically denies site weakness amaurosis fugax or any aphasia. He does have long history of cigarette smoking.  He returns today for routine surveillance.  He fell and fractured his right hip on 07/21/2013, had partial right hip replacement.  He denies any known personal or family history of aneurysms.   Patient has not had previous carotid artery intervention.  Patient has Negative history of TIA or stroke symptom.  The patient denies amaurosis fugax or monocular blindness.  The patient  denies facial drooping.  Pt. denies hemiplegia.  The patient denies receptive or expressive aphasia.  Pt. denies extremity weakness. He denies claudication symptoms with walking.  Pt Diabetic: No Pt smoker: former smoker, quit 07/21/2013  Pt meds include: Statin : No: states his cholesterol is good ASA: No: he has a history of ITP. Other anticoagulants/antiplatelets: no   Past Medical History  Diagnosis Date  . COPD (chronic obstructive pulmonary disease)   . Hyperlipemia   . Hx of colonic polyps   . 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 History of ITP   . Smoker   . Osteopenia     ?  . Osteoporosis     dexa 06, declines further dexa or tx     Social History History  Substance Use Topics  . Smoking status: Current Every Day Smoker -- 0.50 packs/day    Types: Cigarettes  . Smokeless tobacco: Not on file  . Alcohol Use: No    Family History Family History  Problem  Relation Age of Onset  . Cancer Mother     uterine  . Heart disease Sister     some heart trouble    Surgical History Past Surgical History  Procedure Laterality Date  . Cataract extraction    . Hemorrhoid surgery    . Splenectomy    . Bone marrow biopsy  05/2003  . Dexa      per patient ? osteopenia  . Tonsillectomy and adenoidectomy Bilateral age 15 per patient    No Known Allergies  Current Outpatient Prescriptions  Medication Sig Dispense Refill  . CALCIUM CARBONATE PO Take 1 tablet by mouth daily. Tum      . cholecalciferol (VITAMIN D) 1000 UNITS tablet Take 1,000 Units by mouth daily.      . nicotine (NICODERM CQ - DOSED IN MG/24 HOURS) 14 mg/24hr patch Place 7 mg onto the skin daily. For smoking cessation      . acetaminophen (TYLENOL) 650 MG CR tablet Take 650 mg by mouth every 8 (eight) hours as needed. For fever >101.5      . bisacodyl (DULCOLAX) 10 MG suppository Place 10 mg rectally as needed for mild constipation or moderate constipation.      . enoxaparin (LOVENOX) 40 MG/0.4ML injection Inject 40 mg into the skin daily. For DVT/Prophylaxis      . HYDROcodone-acetaminophen (NORCO/VICODIN) 5-325 MG per tablet Take 1 tablet by mouth every 4 (four) hours as needed for moderate pain.      .   polyethylene glycol (MIRALAX / GLYCOLAX) packet Take 17 g by mouth daily. Mix 6- 8 oz of liquid daily for constipation.      . sertraline (ZOLOFT) 100 MG tablet Take 0.5 tablets (50 mg total) by mouth daily. For depression  45 tablet  3   No current facility-administered medications for this visit.    Review of Systems : See HPI for pertinent positives and negatives.  Physical Examination  Filed Vitals:   09/03/13 1136  BP: 116/72  Pulse: 86  Resp: 16   Filed Weights   09/03/13 1136  Weight: 125 lb (56.7 kg)   Body mass index is 19.01 kg/(m^2).  General: WDWN male in NAD GAIT: normal Eyes: PERRLA Pulmonary:  Non-labored, CTAB, Negative  Rales, Negative rhonchi, &  Negative wheezing.  Cardiac: regular Rhythm ,  Negative detected murmur.  VASCULAR EXAM Carotid Bruits Left Right   Negative Negative    Aorta is palpable. Radial pulses are 3+ palpable and equal.                                                                                                                            LE Pulses LEFT RIGHT       FEMORAL   palpable   palpable        POPLITEAL  not palpable   not palpable       POSTERIOR TIBIAL  not palpable   not palpable        DORSALIS PEDIS      ANTERIOR TIBIAL  palpable   palpable     Gastrointestinal: soft, nontender, BS WNL, no r/g,  negative masses.  Musculoskeletal: Negative muscle atrophy/wasting. M/S 5/5 throughout, Extremities without ischemic changes.  Neurologic: A&O X 3; Appropriate Affect ; SENSATION ;normal;  Speech is normal CN 2-12 intact except has some hearing loss, Pain and light touch intact in extremities, Motor exam as listed above.   Non-Invasive Vascular Imaging CAROTID DUPLEX 09/03/2013   Right ICA: 0ccluded. Left ICA: <40% stenosis.  Assessment: Daniel Chapman is a 80 y.o. male who presents with asymptomatic occluded right ICA and minimal stenosis in the left ICA.  [place] [facility] right [facility] occlusion and moderate left ICA stenosis.  The left ICA stenosis is  Improved from previous exam. He does have a prominent abdominal aortic pulse, but he is thin, and the aortic pulse is more readily palpable in thin people, and he has no personal or family history of aneurysms. He has no complaints of back or abdominal pain.  Plan: Follow-up in 1 year with Carotid Duplex scan and later this week for AAA Duplex for prominent abdominal aortic pulse.   I discussed in depth with the patient the nature of atherosclerosis, and emphasized the importance of maximal medical management including strict control of blood pressure, blood glucose, and lipid levels,  obtaining regular exercise, and continued cessation of smoking.  The patient is aware  that without maximal medical management the underlying atherosclerotic disease process will progress, limiting the benefit of any interventions. The patient was given information about stroke prevention and what symptoms should prompt the patient to seek immediate medical care. Thank you for allowing [place] to participate in this patient's care.  Daniel Chambers, RN, MSN, FNP-C [facility] Office: [phone]  Clinic Physician: Chapman  09/03/2013 11:29 AM

## 2013-09-03 NOTE — Patient Instructions (Signed)

## 2013-09-04 NOTE — Addendum Note (Signed)
Addended by: Mena Goes on: 09/04/2013 09:34 AM   Modules accepted: Orders

## 2013-09-10 ENCOUNTER — Encounter: Payer: Self-pay | Admitting: Family

## 2013-09-11 ENCOUNTER — Ambulatory Visit (HOSPITAL_COMMUNITY)
Admission: RE | Admit: 2013-09-11 | Discharge: 2013-09-11 | Disposition: A | Discharge status: Home / Self Care | Payer: Medicare HMO | Source: Ambulatory Visit | Attending: Family | Admitting: Family

## 2013-09-11 ENCOUNTER — Encounter: Payer: Self-pay | Admitting: Family

## 2013-09-11 ENCOUNTER — Ambulatory Visit (INDEPENDENT_AMBULATORY_CARE_PROVIDER_SITE_OTHER): Payer: Commercial Managed Care - HMO | Admitting: Family

## 2013-09-11 VITALS — BP 120/85 | HR 76 | Resp 16 | Ht 68.0 in | Wt 126.0 lb

## 2013-09-11 DIAGNOSIS — I714 Abdominal aortic aneurysm, without rupture, unspecified: Secondary | ICD-10-CM

## 2013-09-11 DIAGNOSIS — R0989 Other specified symptoms and signs involving the circulatory and respiratory systems: Secondary | ICD-10-CM

## 2013-09-11 NOTE — Progress Notes (Signed)
[facility]  Established Abdominal Aortic Aneurysm  History of Present Illness  Daniel Chapman is a 80 y.o. ([birth year]) male patient who was referred to Dr. Donnetta Hutching, and who was found to have a carotid bruit and underwent a duplex for further evaluation of this. Dr. Donnetta Hutching reviewed [facility] [place] [facility] 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 He is left-handed. He specifically denies any prior hemispheric symptoms. Specifically denies site weakness amaurosis fugax or any aphasia. He does have long history of cigarette smoking.   He returns today for aortic Duplex for evaluation of strongly palpable aortic pulse at his visit on 09/03/2013. He denies back or abdominal pain.  He fell and fractured his right hip on 07/21/2013, had partial right hip replacement.  He denies any known personal or family history of aneurysms.  Patient has not had previous carotid artery intervention.  Patient has Negative history of TIA or stroke symptom. The patient denies amaurosis fugax or monocular blindness. The patient denies facial drooping.  Pt. denies hemiplegia. The patient denies receptive or expressive aphasia. Pt. denies extremity weakness.  He denies claudication symptoms with walking.   Pt Diabetic: No  Pt smoker: former smoker, quit 07/21/2013   Pt meds include:  Statin : No: states his cholesterol is good  ASA: No: he has a history of ITP.  Other anticoagulants/antiplatelets: no   Past Medical History  Diagnosis Date  . COPD (chronic obstructive pulmonary disease)   . Hyperlipemia   . Hx of colonic polyps   . 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 History of ITP   . Smoker   . Osteopenia     ?  Marland Kitchen Osteoporosis     dexa 06, declines further dexa or tx   . Carotid artery occlusion   . Fall due to ice or snow Feb. 22, 2015   Partial Right Hip replaced   Past Surgical History  Procedure Laterality Date  . Cataract extraction    . Hemorrhoid surgery    . Splenectomy    . Bone marrow biopsy  05/2003  . Dexa      per patient ? osteopenia  . Tonsillectomy and adenoidectomy Bilateral age 65 per patient  . Joint replacement Right Feb. 22, 2015    Partial Hip   Social History History   Social History  . Marital Status: Married    Spouse Name: N/A    Number of Children: 6  . Years of Education: N/A   Occupational History  . [employer]    Social History Main Topics  . Smoking status: Current Every Day Smoker -- 0.50 packs/day    Types: Cigarettes  . Smokeless tobacco: Never Used  . Alcohol Use: No  . Drug Use: No  . Sexual Activity: Not on file   Other Topics Concern  . Not on file   Social History Narrative  . No narrative on file   Family History Family History  Problem Relation Age of Onset  . Cancer Mother     uterine  . Heart disease Sister     some heart trouble    Current Outpatient Prescriptions on File Prior to Visit  Medication Sig Dispense Refill  . acetaminophen (TYLENOL) 650 MG CR tablet Take 650 mg by mouth every 8 (eight) hours as needed. For fever >101.5      .  bisacodyl (DULCOLAX) 10 MG suppository Place 10 mg rectally as needed for mild constipation or moderate constipation.      Marland Kitchen CALCIUM CARBONATE PO Take 1 tablet by mouth daily. Tum      . cholecalciferol (VITAMIN D) 1000 UNITS tablet Take 1,000 Units by mouth daily.      Marland Kitchen enoxaparin (LOVENOX) 40 MG/0.4ML injection Inject 40 mg into the skin daily. For DVT/Prophylaxis      . HYDROcodone-acetaminophen (NORCO/VICODIN) 5-325 MG per tablet Take 1 tablet by mouth every 4 (four) hours as needed for moderate pain.      . nicotine (NICODERM CQ - DOSED IN MG/24 HOURS) 14 mg/24hr patch Place 7 mg onto the skin daily. For smoking cessation      . polyethylene glycol (MIRALAX / GLYCOLAX) packet Take 17 g by mouth daily. Mix 6- 8  oz of liquid daily for constipation.      . sertraline (ZOLOFT) 100 MG tablet Take 0.5 tablets (50 mg total) by mouth daily. For depression  45 tablet  3   No current facility-administered medications on file prior to visit.   No Known Allergies  ROS: See HPI for pertinent positives and negatives.  Physical Examination  Filed Vitals:   09/11/13 1024  BP: 120/85  Pulse: 76  Resp: 16   Filed Weights   09/11/13 1024  Weight: 126 lb (57.153 kg)   Body mass index is 19.16 kg/(m^2).  General: WDWN male in NAD   GAIT: normal   Eyes: PERRLA   Pulmonary: Non-labored, CTAB, Negative Rales, Negative rhonchi, & Negative wheezing.   Cardiac: regular Rhythm , Negative detected murmur.   VASCULAR EXAM  Carotid Bruits  Left  Right    Negative  Negative   Aorta is palpable.  Radial pulses are 3+ palpable and equal.  LE Pulses  LEFT  RIGHT   FEMORAL  palpable  palpable   POPLITEAL  not palpable  not palpable   POSTERIOR TIBIAL  not palpable  not palpable   DORSALIS PEDIS  ANTERIOR TIBIAL  palpable  palpable    Gastrointestinal: soft, nontender, BS WNL, no r/g, negative masses.   Musculoskeletal: Negative muscle atrophy/wasting. M/S 5/5 throughout, Extremities without ischemic changes.   Neurologic: A&O X 3; Appropriate Affect ; SENSATION ;normal;  Speech is normal  CN 2-12 intact except has some hearing loss, Pain and light touch intact in extremities, Motor exam as listed above.    Non-Invasive Vascular Imaging  CAROTID DUPLEX 09/03/2013  Right ICA: 0ccluded.  Left ICA: <40% stenosis.   Non-Invasive Vascular Imaging (09/11/2013)  ABDOMINAL AORTA DUPLEX EVALUATION    INDICATION: Prominent abdominal pulse    PREVIOUS INTERVENTION(S):     DUPLEX EXAM:     LOCATION DIAMETER AP (cm) DIAMETER TRANSVERSE (cm) VELOCITIES (cm/sec)  Aorta Proximal 2.8 2.9 41  Aorta Mid 2.0 2.3 36  Aorta Distal 2.1 2.0 49  Right Common Iliac Artery 1.1 1.1 128  Left Common Iliac Artery  0.9 0.9 71    Previous max aortic diameter:   Date:      ADDITIONAL FINDINGS:   Decreased visualization of the abdominal vasculature due to overlying bowel gas.   No hemodynamically significant stenosis of the abdominal aorta and bilateral proximal common iliac arteries with mild plaque formations noted in the abdominal aorta.     IMPRESSION: No evidence of aneurysmal dilatation of the abdominal aorta noted.     Compared to the previous exam:  No previous duplex exam available for comparison.  Medical Decision Making  The patient is a 81 y.o. male who presents with asymptomatic normal sized aorta with no evidence of aneurysmal dilatation of the abdominal aorta noted.   Based on this patient's exam and diagnostic studies, the patient will follow up in 1 year  with the following studies: carotid Duplex already scheduled.  I emphasized the importance of maximal medical management including strict control of blood pressure, blood glucose, and lipid levels, antiplatelet agents, obtaining regular exercise, and continued cessation of smoking.    Thank you for allowing us to participate in this patient's care.   , RN, MSN, FNP-C [facility] Office: [phone]  Clinic Physician: Lawson on call  09/11/2013, 9:55 AM      

## 2013-11-08 ENCOUNTER — Telehealth: Payer: Self-pay

## 2013-11-08 MED ORDER — ALPRAZOLAM 0.5 MG PO TABS: 0.5000 mg | ORAL_TABLET | Freq: Two times a day (BID) | ORAL | Status: DC | PRN
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
## 2013-11-08 NOTE — Telephone Encounter (Signed)
Please make sure he is not confused or having delerium/ etc or signs of infection  Use xanax bid prn with caution of sedation/falls and habit  F/u if no improvement  Px written for call in

## 2013-11-08 NOTE — Telephone Encounter (Signed)
Aware- he has also declined therapy in the past - f/u if no imp (if he will come in)
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
## 2013-12-05 ENCOUNTER — Telehealth: Payer: Self-pay

## 2013-12-05 NOTE — Telephone Encounter (Signed)
To clarify- both parents correct? And are they aware we will be referring them? Do they want couples counseling (together)? Or individual?
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
## 2013-12-06 NOTE — Telephone Encounter (Signed)
I did the referral for counseling for his wife [name]  Please have him follow up with me if possible -thanks

## 2013-12-09 NOTE — Telephone Encounter (Signed)
Left voicemail letting pt's daughter know that we put referral in for pt's wife and that pt needs to f/u with Dr. Glori Bickers if possible and we can discuss it then

## 2014-02-14 NOTE — Progress Notes (Signed)
This encounter was created in error - please disregard.

## 2014-03-19 ENCOUNTER — Ambulatory Visit (INDEPENDENT_AMBULATORY_CARE_PROVIDER_SITE_OTHER): Payer: Commercial Managed Care - HMO

## 2014-03-19 DIAGNOSIS — Z23 Encounter for immunization: Secondary | ICD-10-CM

## 2014-09-08 ENCOUNTER — Encounter: Payer: Self-pay | Admitting: Family

## 2014-09-09 ENCOUNTER — Other Ambulatory Visit: Payer: Self-pay | Admitting: *Deleted

## 2014-09-09 ENCOUNTER — Ambulatory Visit (INDEPENDENT_AMBULATORY_CARE_PROVIDER_SITE_OTHER): Payer: Commercial Managed Care - HMO | Admitting: Family

## 2014-09-09 ENCOUNTER — Ambulatory Visit (HOSPITAL_COMMUNITY)
Admission: RE | Admit: 2014-09-09 | Discharge: 2014-09-09 | Disposition: A | Discharge status: Home / Self Care | Payer: Commercial Managed Care - HMO | Source: Ambulatory Visit | Attending: Family | Admitting: Family

## 2014-09-09 ENCOUNTER — Encounter: Payer: Self-pay | Admitting: Family

## 2014-09-09 VITALS — BP 104/64 | HR 74 | Resp 16 | Ht 68.0 in | Wt 131.0 lb

## 2014-09-09 DIAGNOSIS — Z720 Tobacco use: Secondary | ICD-10-CM

## 2014-09-09 DIAGNOSIS — I6521 Occlusion and stenosis of right carotid artery: Secondary | ICD-10-CM

## 2014-09-09 DIAGNOSIS — I6523 Occlusion and stenosis of bilateral carotid arteries: Secondary | ICD-10-CM | POA: Diagnosis not present

## 2014-09-09 DIAGNOSIS — F17200 Nicotine dependence, unspecified, uncomplicated: Secondary | ICD-10-CM

## 2014-09-09 DIAGNOSIS — I6522 Occlusion and stenosis of left carotid artery: Secondary | ICD-10-CM

## 2014-09-09 NOTE — Progress Notes (Signed)
Established Carotid Patient   History of Present Illness  Daniel Chapman is a 81 y.o. male patient who was referred to Dr. Donnetta Hutching, and who was found to have a carotid bruit and underwent a duplex for further evaluation of this. Dr. Donnetta Hutching reviewed his [place] from 08/10/2012 which was done at [facility] heart care. 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 Patient has not had previous carotid artery intervention.  He is left-handed. He specifically denies any prior hemispheric symptoms. Specifically denies hemiparesis, amaurosis fugax, or any aphasia. He does have a long history of cigarette smoking.   He returns today for follow up. He had a strongly palpable aortic pulse at his visit on 09/03/2013; aortic Duplex later in April of 2015 revealed no aneurysm of the abdominal aorta. He denies any known personal or family history of aneurysms. He denies back or abdominal pain.  He fell and fractured his right hip on 07/21/2013, had a partial right hip replacement.    He denies claudication symptoms with walking.   Pt Diabetic: No  Pt smoker: smoking one cigarette/day  Pt meds include:  Statin : No: states his cholesterol is good  ASA: No: he has a history of ITP.  Other anticoagulants/antiplatelets: no    Past Medical History  Diagnosis Date  . COPD (chronic obstructive pulmonary disease)   . Hyperlipemia   . Hx of colonic polyps   . 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 History of ITP   . Smoker   . Osteopenia     ?  Marland Kitchen Osteoporosis     dexa 06, declines further dexa or tx   . Carotid artery occlusion   . Fall due to ice or snow Feb. 22, 2015    Partial Right Hip replaced    Social History History  Substance Use Topics  . Smoking status: Current Every Day Smoker -- 0.50 packs/day    Types: Cigarettes  . Smokeless tobacco: Never Used  . Alcohol Use: No    Family  History Family History  Problem Relation Age of Onset  . Cancer Mother     uterine  . Heart disease Sister     some heart trouble    Surgical History Past Surgical History  Procedure Laterality Date  . Cataract extraction    . Hemorrhoid surgery    . Splenectomy    . Bone marrow biopsy  05/2003  . Dexa      per patient ? osteopenia  . Tonsillectomy and adenoidectomy Bilateral age 30 per patient  . Joint replacement Right Feb. 22, 2015    Partial Hip    No Known Allergies  Current Outpatient Prescriptions  Medication Sig Dispense Refill  . acetaminophen (TYLENOL) 650 MG CR tablet Take 650 mg by mouth every 8 (eight) hours as needed. For fever >101.5    . ALPRAZolam (XANAX) 0.5 MG tablet Take 1 tablet (0.5 mg total) by mouth 2 (two) times daily as needed for anxiety. 30 tablet 0  . bisacodyl (DULCOLAX) 10 MG suppository Place 10 mg rectally as needed for mild constipation or moderate constipation.    Marland Kitchen CALCIUM CARBONATE PO Take 1 tablet by mouth daily. Tum    . cholecalciferol (VITAMIN D) 1000 UNITS tablet Take 1,000 Units by mouth daily.    Marland Kitchen enoxaparin (LOVENOX) 40 MG/0.4ML injection Inject 40 mg into the skin daily. For DVT/Prophylaxis    .  polyethylene glycol (MIRALAX / GLYCOLAX) packet Take 17 g by mouth daily. Mix 6- 8 oz of liquid daily for constipation.    Marland Kitchen HYDROcodone-acetaminophen (NORCO/VICODIN) 5-325 MG per tablet Take 1 tablet by mouth every 4 (four) hours as needed for moderate pain.    . nicotine (NICODERM CQ - DOSED IN MG/24 HOURS) 14 mg/24hr patch Place 7 mg onto the skin daily. For smoking cessation     No current facility-administered medications for this visit.    Review of Systems : See HPI for pertinent positives and negatives.  Physical Examination  Filed Vitals:   09/09/14 1046 09/09/14 1049  BP: 105/65 104/64  Pulse: 75 74  Resp:  16  Height:  _0  (1.727 m)  Weight:  131 lb (59.421 kg)  SpO2:  98%   Body mass index is 19.92  kg/(m^2).  General: WDWN male in NAD   GAIT: normal   Eyes: PERRLA   Pulmonary: Non-labored, CTAB, Negative Rales, Negative rhonchi, & Negative wheezing.   Cardiac: regular Rhythm, no detected murmur.   VASCULAR EXAM  Carotid Bruits  Left  Right    Negative  positive   Aorta is palpable.  Radial pulses are 3+ palpable and equal.    LE Pulses  LEFT  RIGHT   FEMORAL  palpable  palpable   POPLITEAL  not palpable  not palpable   POSTERIOR TIBIAL  not palpable  not palpable   DORSALIS PEDIS  ANTERIOR TIBIAL  palpable  palpable    Gastrointestinal: soft, nontender, BS WNL, no r/g, no palpable masses.   Musculoskeletal: Negative muscle atrophy/wasting. M/S 5/5 throughout, Extremities without ischemic changes.   Neurologic: A&O X 3; Appropriate Affect, Speech is normal  CN 2-12 intact except has some hearing loss, Pain and light touch intact in extremities, Motor exam as listed above.          Non-Invasive Vascular Imaging CAROTID DUPLEX 09/09/2014   CEREBROVASCULAR DUPLEX EVALUATION    INDICATION: Follow-up carotid disease     PREVIOUS INTERVENTION(S):     DUPLEX EXAM: Known occlusion of right internal carotid artery.    RIGHT  LEFT  Peak Systolic Velocities (cm/s) End Diastolic Velocities (cm/s) Plaque LOCATION Peak Systolic Velocities (cm/s) End Diastolic Velocities (cm/s) Plaque  84 9  CCA PROXIMAL 99 31   120 15  CCA MID 81 21   75 10  CCA DISTAL 66 23   198 38  ECA 503 160 HM  0 0 occluded ICA PROXIMAL 105 38 HT  0 0  ICA MID 116 33   0 0  ICA DISTAL 105 33     NA ICA / CCA Ratio (PSV) 1.6  Antegrade  Vertebral Flow Antegrade    Brachial Systolic Pressure (mmHg)   Within normal limits  Brachial Artery Waveforms Within normal limits     Plaque Morphology:  HM = Homogeneous, HT = Heterogeneous, CP = Calcific Plaque, SP = Smooth Plaque, IP = Irregular Plaque  ADDITIONAL FINDINGS:     IMPRESSION: 1. Confirmed occlusion of  right internal carotid artery. 2. Evidence of <40% stenosis of the left internal carotid artery. 3. Significant stenosis is present in the left external carotid artery. 4. Bilateral vertebral artery is antegrade.    Compared to the previous exam:  No significant change compared to prior exam.       Assessment: Daniel Chapman is a 81 y.o. 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 Patient has not had previous carotid artery intervention.  He is left-handed. He specifically denies any prior hemispheric symptoms. Specifically denies hemiparesis, amaurosis fugax, or any aphasia. He does have a long history of cigarette smoking.  Today's carotid Duplex suggests a confirmed occlusion of right internal carotid artery, <40% stenosis of the left internal carotid artery, and significant stenosis in the left external carotid artery. No significant change compared to prior Duplex.  He had a strongly palpable aortic pulse at his visit on 09/03/2013; aortic Duplex later in April of 2015 revealed no aneurysm of the abdominal aorta.   He continues to smoke one cigarette/day; he was counseled to quit.  Plan: Follow-up in 1 year with Carotid Duplex.   I discussed in depth with the patient the nature of atherosclerosis, and emphasized the importance of maximal medical management including strict control of blood pressure, blood glucose, and lipid levels, obtaining regular exercise, and cessation of smoking.  The patient is aware that without maximal medical management the underlying atherosclerotic disease process will progress, limiting the benefit of any interventions. The patient was given information about stroke prevention and what symptoms should prompt the patient to seek immediate medical care. Thank you for allowing [place] to participate in this patient's care.  Clemon Chambers, RN, MSN, FNP-C [facility] [place] Office: [phone]  Clinic Physician: Early  09/09/2014 10:55 AM

## 2014-09-09 NOTE — Patient Instructions (Signed)
Stroke Prevention Some medical conditions and behaviors are associated with an increased chance of having a stroke. You may prevent a stroke by making healthy choices and managing medical conditions. HOW CAN I REDUCE MY RISK OF HAVING A STROKE?   Stay physically active. Get at least 30 minutes of activity on most or all days.  Do not smoke. It may also be helpful to avoid exposure to secondhand smoke.  Limit alcohol use. Moderate alcohol use is considered to be:  No more than 2 drinks per day for men.  No more than 1 drink per day for nonpregnant women.  Eat healthy foods. This involves:  Eating 5 or more servings of fruits and vegetables a day.  Making dietary changes that address high blood pressure (hypertension), high cholesterol, diabetes, or obesity.  Manage your cholesterol levels.  Making food choices that are high in fiber and low in saturated fat, trans fat, and cholesterol may control cholesterol levels.  Take any prescribed medicines to control cholesterol as directed by your health care provider.  Manage your diabetes.  Controlling your carbohydrate and sugar intake is recommended to manage diabetes.  Take any prescribed medicines to control diabetes as directed by your health care provider.  Control your hypertension.  Making food choices that are low in salt (sodium), saturated fat, trans fat, and cholesterol is recommended to manage hypertension.  Take any prescribed medicines to control hypertension as directed by your health care provider.  Maintain a healthy weight.  Reducing calorie intake and making food choices that are low in sodium, saturated fat, trans fat, and cholesterol are recommended to manage weight.  Stop drug abuse.  Avoid taking birth control pills.  Talk to your health care provider about the risks of taking birth control pills if you are over 35 years old, smoke, get migraines, or have ever had a blood clot.  Get evaluated for sleep  disorders (sleep apnea).  Talk to your health care provider about getting a sleep evaluation if you snore a lot or have excessive sleepiness.  Take medicines only as directed by your health care provider.  For some people, aspirin or blood thinners (anticoagulants) are helpful in reducing the risk of forming abnormal blood clots that can lead to stroke. If you have the irregular heart rhythm of atrial fibrillation, you should be on a blood thinner unless there is a good reason you cannot take them.  Understand all your medicine instructions.  Make sure that other conditions (such as anemia or atherosclerosis) are addressed. SEEK IMMEDIATE MEDICAL CARE IF:   You have sudden weakness or numbness of the face, arm, or leg, especially on one side of the body.  Your face or eyelid droops to one side.  You have sudden confusion.  You have trouble speaking (aphasia) or understanding.  You have sudden trouble seeing in one or both eyes.  You have sudden trouble walking.  You have dizziness.  You have a loss of balance or coordination.  You have a sudden, severe headache with no known cause.  You have new chest pain or an irregular heartbeat. Any of these symptoms may represent a serious problem that is an emergency. Do not wait to see if the symptoms will go away. Get medical help at once. Call your local emergency services (911 in [place] Do not drive yourself to the hospital. Document Released: 06/23/2004 Document Revised: 09/30/2013 Document Reviewed: 11/16/2012 Exit[employer] Patient Information 2015 [employer]. This information is not intended to replace advice given   to you by your health care provider. Make sure you discuss any questions you have with your health care provider.  

## 2014-10-15 ENCOUNTER — Other Ambulatory Visit: Payer: Self-pay | Admitting: Family Medicine

## 2014-10-15 NOTE — Telephone Encounter (Signed)
Electronic refill request, pt has CPE scheduled for 12/17/14, last refilled on 08/09/13 #45 with 3 additional refills, please advise

## 2014-10-15 NOTE — Telephone Encounter (Signed)
done

## 2014-10-15 NOTE — Telephone Encounter (Signed)
Please refill until his appt

## 2014-11-19 ENCOUNTER — Encounter: Payer: Self-pay | Admitting: Gastroenterology

## 2014-12-09 ENCOUNTER — Telehealth: Payer: Self-pay | Admitting: Family Medicine

## 2014-12-09 DIAGNOSIS — D696 Thrombocytopenia, unspecified: Secondary | ICD-10-CM

## 2014-12-09 DIAGNOSIS — E785 Hyperlipidemia, unspecified: Secondary | ICD-10-CM

## 2014-12-09 DIAGNOSIS — Z0000 Encounter for general adult medical examination without abnormal findings: Secondary | ICD-10-CM

## 2014-12-09 DIAGNOSIS — Z125 Encounter for screening for malignant neoplasm of prostate: Secondary | ICD-10-CM

## 2014-12-09 NOTE — Telephone Encounter (Signed)
-----   Message from Ellamae Sia sent at 12/03/2014  3:25 PM EDT ----- Regarding: Lab orders for Wednesday,7.13.16 Patient is scheduled for CPX labs, please order future labs, Thanks , Karna Christmas

## 2014-12-10 ENCOUNTER — Other Ambulatory Visit (INDEPENDENT_AMBULATORY_CARE_PROVIDER_SITE_OTHER): Payer: Commercial Managed Care - HMO

## 2014-12-10 DIAGNOSIS — E785 Hyperlipidemia, unspecified: Secondary | ICD-10-CM | POA: Diagnosis not present

## 2014-12-10 DIAGNOSIS — Z125 Encounter for screening for malignant neoplasm of prostate: Secondary | ICD-10-CM

## 2014-12-10 DIAGNOSIS — Z0000 Encounter for general adult medical examination without abnormal findings: Secondary | ICD-10-CM | POA: Diagnosis not present

## 2014-12-10 DIAGNOSIS — D696 Thrombocytopenia, unspecified: Secondary | ICD-10-CM | POA: Diagnosis not present

## 2014-12-10 LAB — CBC WITH DIFFERENTIAL/PLATELET
BASOS ABS: 0 10*3/uL (ref 0.0–0.1)
Basophils Relative: 0.7 % (ref 0.0–3.0)
Eosinophils Absolute: 0.2 10*3/uL (ref 0.0–0.7)
Eosinophils Relative: 3.2 % (ref 0.0–5.0)
HCT: 38.9 % — ABNORMAL LOW (ref 39.0–52.0)
Hemoglobin: 12.8 g/dL — ABNORMAL LOW (ref 13.0–17.0)
LYMPHS ABS: 2.4 10*3/uL (ref 0.7–4.0)
LYMPHS PCT: 33 % (ref 12.0–46.0)
MCHC: 33 g/dL (ref 30.0–36.0)
MCV: 93.8 fl (ref 78.0–100.0)
MONOS PCT: 10.4 % (ref 3.0–12.0)
Monocytes Absolute: 0.8 10*3/uL (ref 0.1–1.0)
Neutro Abs: 3.9 10*3/uL (ref 1.4–7.7)
Neutrophils Relative %: 52.7 % (ref 43.0–77.0)
PLATELETS: 198 10*3/uL (ref 150.0–400.0)
RBC: 4.15 Mil/uL — AB (ref 4.22–5.81)
RDW: 14 % (ref 11.5–15.5)
WBC: 7.3 10*3/uL (ref 4.0–10.5)

## 2014-12-10 LAB — COMPREHENSIVE METABOLIC PANEL
ALT: 12 U/L (ref 0–53)
AST: 17 U/L (ref 0–37)
Albumin: 3.6 g/dL (ref 3.5–5.2)
Alkaline Phosphatase: 61 U/L (ref 39–117)
BILIRUBIN TOTAL: 0.4 mg/dL (ref 0.2–1.2)
BUN: 16 mg/dL (ref 6–23)
CO2: 25 mEq/L (ref 19–32)
Calcium: 9.1 mg/dL (ref 8.4–10.5)
Chloride: 107 mEq/L (ref 96–112)
Creatinine, Ser: 0.91 mg/dL (ref 0.40–1.50)
GFR: 84.94 mL/min (ref >60.00)
Glucose, Bld: 101 mg/dL — ABNORMAL HIGH (ref 70–99)
Potassium: 4.4 mEq/L (ref 3.5–5.1)
Sodium: 140 mEq/L (ref 135–145)
Total Protein: 6.9 g/dL (ref 6.0–8.3)

## 2014-12-10 LAB — LIPID PANEL
CHOL/HDL RATIO: 4
Cholesterol: 132 mg/dL (ref 0–200)
HDL: 31.1 mg/dL — ABNORMAL LOW (ref >39.00)
LDL CALC: 83 mg/dL (ref 0–99)
NonHDL: 100.9
Triglycerides: 91 mg/dL (ref 0.0–149.0)
VLDL: 18.2 mg/dL (ref 0.0–40.0)

## 2014-12-10 LAB — PSA: PSA: 0.36 ng/mL (ref 0.10–4.00)

## 2014-12-10 LAB — TSH: TSH: 2.2 u[IU]/mL (ref 0.35–4.50)

## 2014-12-17 ENCOUNTER — Encounter: Payer: Self-pay | Admitting: Family Medicine

## 2014-12-17 ENCOUNTER — Ambulatory Visit (INDEPENDENT_AMBULATORY_CARE_PROVIDER_SITE_OTHER): Payer: Commercial Managed Care - HMO | Admitting: Family Medicine

## 2014-12-17 VITALS — BP 122/78 | HR 78 | Temp 97.5°F | Ht 66.5 in | Wt 119.5 lb

## 2014-12-17 DIAGNOSIS — Z125 Encounter for screening for malignant neoplasm of prostate: Secondary | ICD-10-CM

## 2014-12-17 DIAGNOSIS — Z1211 Encounter for screening for malignant neoplasm of colon: Secondary | ICD-10-CM

## 2014-12-17 DIAGNOSIS — Z0000 Encounter for general adult medical examination without abnormal findings: Secondary | ICD-10-CM | POA: Diagnosis not present

## 2014-12-17 DIAGNOSIS — E785 Hyperlipidemia, unspecified: Secondary | ICD-10-CM

## 2014-12-17 DIAGNOSIS — F17200 Nicotine dependence, unspecified, uncomplicated: Secondary | ICD-10-CM

## 2014-12-17 DIAGNOSIS — Z23 Encounter for immunization: Secondary | ICD-10-CM | POA: Diagnosis not present

## 2014-12-17 DIAGNOSIS — M8580 Other specified disorders of bone density and structure, unspecified site: Secondary | ICD-10-CM

## 2014-12-17 DIAGNOSIS — Z9081 Acquired absence of spleen: Secondary | ICD-10-CM

## 2014-12-17 MED ORDER — SERTRALINE HCL 100 MG PO TABS: ORAL_TABLET | ORAL | Status: DC

## 2014-12-17 NOTE — Assessment & Plan Note (Signed)
Reviewed health habits including diet and exercise and skin cancer prevention Reviewed appropriate screening tests for age  Also reviewed health mt list, fam hx and immunization status , as well as social and family history   See HPI Labs reviewed Stool ifob kit given for colon screen (also hb was down slightly)  prevnar vaccine today Counseling on smoking cessation  Pt reports he did have the shingles vaccine as well  

## 2014-12-17 NOTE — Assessment & Plan Note (Signed)
IFOB kit given colonosc 2010 Pt not interested in another one Hb is down slightly -no symptoms

## 2014-12-17 NOTE — Assessment & Plan Note (Signed)
Disc goals for lipids and reasons to control them Rev labs with pt Rev low sat fat diet in detail HDL is low but improved from last check

## 2014-12-17 NOTE — Progress Notes (Signed)
Pre visit review using our clinic review tool, if applicable. No additional management support is needed unless otherwise documented below in the visit note. 

## 2014-12-17 NOTE — Assessment & Plan Note (Signed)
Lab Results  Component Value Date   PSA 0.36 12/10/2014   PSA 0.48 07/11/2011   PSA 0.41 05/12/2010   No symptoms  No nocturia

## 2014-12-17 NOTE — Assessment & Plan Note (Signed)
For low platelets-that is resolved slt anemia today Stool kit given prevnar given

## 2014-12-17 NOTE — Patient Instructions (Signed)
Take care of yourself  Labs are stable but blood count is down a little  Please do stool kit for screening  prevnar vaccine today (pneumonia booster)  Try to keep eating regular meals - if you do not gain weight back please let me know

## 2014-12-17 NOTE — Progress Notes (Signed)
Subjective:    Patient ID: Daniel Chapman, male    DOB: Oct 09, 1931, 81 y.o.   MRN: [id number]  HPI Here for annual medicare wellness visit as well as chronic/acute medical problems with annual preventative exam   I have personally reviewed the Medicare Annual Wellness questionnaire and have noted 1. The patient's medical and social history 2. Their use of alcohol, tobacco or illicit drugs 3. Their current medications and supplements 4. The patient's functional ability including ADL's, fall risks, home safety risks and hearing or visual             impairment. 5. Diet and physical activities 6. Evidence for depression or mood disorders  The patients weight, height, BMI have been recorded in the chart and visual acuity is per eye clinic.  I have made referrals, counseling and provided education to the patient based review of the above and I have provided the pt with a written personalized care plan for preventive services. Reviewed and updated provider list, see scanned forms.  Doing ok overall  Nothing new going on   See scanned forms.  Routine anticipatory guidance given to patient.  See health maintenance. Colon cancer screening 11/10 - ? Polyp- does not want another one  dexa - osteopenia 2/06 - declines further eval of that (he takes his ca and D) Flu vaccine 10/15  Tetanus vaccine 11/13  Pneumovax 11/08 -will get prevnar today  Zoster vaccine - had it already - paid 90$ (at a pharmacy) Prostate cancer screening  Lab Results  Component Value Date   PSA 0.36 12/10/2014   PSA 0.48 07/11/2011   PSA 0.41 05/12/2010   no problems with urination    Advance directive-has a living will and power of attorney  Cognitive function addressed- see scanned forms- and if abnormal then additional documentation follows.  Short term memory issues /nothing has changed   PMH and SH reviewed  Meds, vitals, and allergies reviewed.   ROS: See HPI.  Otherwise negative.     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  He "eats like a horse" and a lot of sweets  Weight goes up in the winter   Smoking status - about the same -- tries to quit   BP Readings from Last 3 Encounters:  12/17/14 122/78  09/09/14 104/64  09/11/13 120/85     Carotid stenosis-followed by vascular tx cholesterol Lab Results  Component Value Date   CHOL 132 12/10/2014   CHOL 154 08/01/2012   CHOL 161 07/11/2011   Lab Results  Component Value Date   HDL 31.10* 12/10/2014   HDL 25.50* 08/01/2012   HDL 31.80* 07/11/2011   Lab Results  Component Value Date   LDLCALC 83 12/10/2014   LDLCALC 94 08/01/2012   LDLCALC 107* 07/11/2011   Lab Results  Component Value Date   TRIG 91.0 12/10/2014   TRIG 174.0* 08/01/2012   TRIG 112.0 07/11/2011   Lab Results  Component Value Date   CHOLHDL 4 12/10/2014   CHOLHDL 6 08/01/2012   CHOLHDL 5 07/11/2011   No results found for: LDLDIRECT  Overall stable  No changes in throat  He does not have to f/u with ENT   Lab Results  Component Value Date   WBC 7.3 12/10/2014   HGB 12.8* 12/10/2014   HCT 38.9* 12/10/2014   MCV 93.8 12/10/2014   PLT 198.0 12/10/2014   Hb down  a bit - no abdominal pain , no blood in stool   Lab Results  Component Value Date   TSH 2.20 12/10/2014     Patient Active Problem List   Diagnosis Date Noted  . Encounter for Medicare annual wellness exam 12/17/2014  . Routine general medical examination at a health care facility 12/09/2014  . Abdominal aneurysm without mention of rupture 09/11/2013  . S/P total hip arthroplasty 08/02/2013  . H/O splenectomy 08/02/2013  . Closed fracture of femur, neck 07/20/2013  . Occlusion and stenosis of carotid artery without mention of cerebral infarction 08/28/2012  . Carotid stenosis 08/14/2012  . Bruit of left carotid artery 08/01/2012  . Anxiety 01/27/2012  . Osteopenia 07/19/2011  . Prostate cancer screening  07/10/2011  . HOARSENESS, CHRONIC 02/05/2007  . 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 Hyperlipidemia 11/06/2006  . Thrombocytopenia 11/06/2006  . TOBACCO ABUSE 11/06/2006  . COPD 11/06/2006  . COLONIC POLYPS, HX OF 11/06/2006  . PROSTATITIS, HX OF 11/06/2006   Past Medical History  Diagnosis Date  . COPD (chronic obstructive pulmonary disease)   . Hyperlipemia   . Hx of colonic polyps   . 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 History of ITP   . Smoker   . Osteopenia     ?  Marland Kitchen Osteoporosis     dexa 06, declines further dexa or tx   . Carotid artery occlusion   . Fall due to ice or snow Feb. 22, 2015    Partial Right Hip replaced   Past Surgical History  Procedure Laterality Date  . Cataract extraction    . Hemorrhoid surgery    . Splenectomy    . Bone marrow biopsy  05/2003  . Dexa      per patient ? osteopenia  . Tonsillectomy and adenoidectomy Bilateral age 8 per patient  . Joint replacement Right Feb. 22, 2015    Partial Hip   History  Substance Use Topics  . Smoking status: Current Every Day Smoker -- 0.50 packs/day    Types: Cigarettes  . Smokeless tobacco: Never Used  . Alcohol Use: No   Family History  Problem Relation Age of Onset  . Cancer Mother     uterine  . Heart disease Sister     some heart trouble   No Known Allergies Current Outpatient Prescriptions on File Prior to Visit  Medication Sig Dispense Refill  . cholecalciferol (VITAMIN D) 1000 UNITS tablet Take 1,000 Units by mouth daily.    . sertraline (ZOLOFT) 100 MG tablet TAKE 1/2 TABLET  (50MG) EVERY DAY  FOR  DEPRESSION 45 tablet 0   No current facility-administered medications on file prior to visit.    Review of Systems Review of Systems  Constitutional: Negative for fever, appetite change, fatigue and unexpected weight change.  Eyes: Negative for pain and visual disturbance.  Respiratory: Negative for cough and shortness of breath.   Cardiovascular:  Negative for cp or palpitations    Gastrointestinal: Negative for nausea, diarrhea and constipation.  Genitourinary: Negative for urgency and frequency.  Skin: Negative for pallor or rash   Neurological: Negative for weakness, light-headedness, numbness and headaches.  Hematological: Negative for adenopathy. Does not bruise/bleed easily.  Psychiatric/Behavioral: Negative for dysphoric mood. The patient is not nervous/anxious.         Objective:   Physical Exam  Constitutional: He appears well-developed and well-nourished. No distress.  Slim and well appearing   HENT:  Head: Normocephalic  and atraumatic.  Right Ear: External ear normal.  Left Ear: External ear normal.  Nose: Nose normal.  Mouth/Throat: Oropharynx is clear and moist.  Baseline hoarse voice   Eyes: Conjunctivae and EOM are normal. Pupils are equal, round, and reactive to light. Right eye exhibits no discharge. Left eye exhibits no discharge. No scleral icterus.  Neck: Normal range of motion. Neck supple. No JVD present. Carotid bruit is not present. No thyromegaly present.  Cardiovascular: Normal rate, regular rhythm, normal heart sounds and intact distal pulses.  Exam reveals no gallop.   Pulmonary/Chest: Effort normal and breath sounds normal. No respiratory distress. He has no wheezes. He exhibits no tenderness.  Diffusely distant bs   Abdominal: Soft. Bowel sounds are normal. He exhibits no distension, no abdominal bruit and no mass. There is no tenderness.  Musculoskeletal: He exhibits no edema or tenderness.  Mild kyphosis   Lymphadenopathy:    He has no cervical adenopathy.  Neurological: He is alert. He has normal reflexes. No cranial nerve deficit. He exhibits normal muscle tone. Coordination normal.  Skin: Skin is warm and dry. No rash noted. No erythema. No pallor.  Psychiatric: He has a normal mood and affect.  Affect is baseline "explosive" and somewhat aggressive  No change from usual    Nursing note  and vitals reviewed.         Assessment & Plan:   Problem List Items Addressed This Visit    Encounter for Medicare annual wellness exam - Primary    Reviewed health habits including diet and exercise and skin cancer prevention Reviewed appropriate screening tests for age  Also reviewed health mt list, fam hx and immunization status , as well as social and family history   See HPI Labs reviewed Stool ifob kit given for colon screen (also hb was down slightly)  prevnar vaccine today Counseling on smoking cessation  Pt reports he did have the shingles vaccine as well      H/O splenectomy    For low platelets-that is resolved slt anemia today Stool kit given prevnar given       Hyperlipidemia    Disc goals for lipids and reasons to control them Rev labs with pt Rev low sat fat diet in detail HDL is low but improved from last check       Osteopenia    Pt declines further eval On ca and D No new falls or fractures  Disc need for calcium/ vitamin D/ wt bearing exercise and bone density test every 2 y to monitor Disc safety/ fracture risk in detail        Prostate cancer screening    Lab Results  Component Value Date   PSA 0.36 12/10/2014   PSA 0.48 07/11/2011   PSA 0.41 05/12/2010   No symptoms  No nocturia       Routine general medical examination at a health care facility    Reviewed health habits including diet and exercise and skin cancer prevention Reviewed appropriate screening tests for age  Also reviewed health mt list, fam hx and immunization status , as well as social and family history   See HPI Labs reviewed Stool ifob kit given for colon screen (also hb was down slightly)  prevnar vaccine today Counseling on smoking cessation  Pt reports he did have the shingles vaccine as well       Special screening for malignant neoplasms, colon    IFOB kit given colonosc 2010 Pt not interested in another  one Hb is down slightly -no symptoms        Relevant Orders   Fecal occult blood, imunochemical   TOBACCO ABUSE    Disc in detail risks of smoking and possible outcomes including copd, vascular/ heart disease, cancer , respiratory and sinus infections  Pt voices understanding Pt is not ready to quit -multiple attempts Urged him to keep thinking about it        Other Visit Diagnoses    Need for vaccination with 13-polyvalent pneumococcal conjugate vaccine        Relevant Orders    Pneumococcal conjugate vaccine 13-valent (Completed)

## 2014-12-17 NOTE — Assessment & Plan Note (Signed)
Disc in detail risks of smoking and possible outcomes including copd, vascular/ heart disease, cancer , respiratory and sinus infections  Pt voices understanding Pt is not ready to quit -multiple attempts Urged him to keep thinking about it

## 2014-12-17 NOTE — Assessment & Plan Note (Signed)
Pt declines further eval On ca and D No new falls or fractures  Disc need for calcium/ vitamin D/ wt bearing exercise and bone density test every 2 y to monitor Disc safety/ fracture risk in detail

## 2014-12-22 ENCOUNTER — Encounter: Payer: Self-pay | Admitting: Family Medicine

## 2014-12-22 ENCOUNTER — Ambulatory Visit: Payer: Commercial Managed Care - HMO | Admitting: Family Medicine

## 2014-12-22 ENCOUNTER — Ambulatory Visit (INDEPENDENT_AMBULATORY_CARE_PROVIDER_SITE_OTHER)
Admission: RE | Admit: 2014-12-22 | Discharge: 2014-12-22 | Disposition: A | Discharge status: Home / Self Care | Payer: Commercial Managed Care - HMO | Source: Ambulatory Visit | Attending: Family Medicine | Admitting: Family Medicine

## 2014-12-22 ENCOUNTER — Ambulatory Visit (INDEPENDENT_AMBULATORY_CARE_PROVIDER_SITE_OTHER): Payer: Commercial Managed Care - HMO | Admitting: Family Medicine

## 2014-12-22 VITALS — BP 110/64 | HR 75 | Temp 97.3°F | Ht 66.5 in | Wt 121.0 lb

## 2014-12-22 DIAGNOSIS — M79645 Pain in left finger(s): Secondary | ICD-10-CM | POA: Diagnosis not present

## 2014-12-22 DIAGNOSIS — S62619A Displaced fracture of proximal phalanx of unspecified finger, initial encounter for closed fracture: Secondary | ICD-10-CM

## 2014-12-22 NOTE — Progress Notes (Signed)
Dr. Karleen Hampshire T. Daniel Nordahl, MD, CAQ [facility] [address] [place], [address] Phone: [phone] Fax: [phone]  12/22/2014  Patient: Daniel Chapman, MRN: [id number], DOB: 17-Apr-1932, 81 y.o.  Primary Physician:  Roxy Manns, MD  Chief Complaint: Finger Injury  Subjective:   Daniel Chapman is a 81 y.o. very pleasant male patient who presents with the following:  L 3rd finger trauma, 12/21/2014  The patient reports that he was cutting his grass, and riding on his [place], then he had a log which forcefully struck his left hand, and primarily hit his third finger.  His exact memory is clouded secondary to pain.  He did not fall off of his lawnmower, and he did not strike his head.  Since then he has had some swelling in his third finger and significant pain.  Past Medical History, Surgical History, Social History, Family History, Problem List, Medications, and Allergies have been reviewed and updated if relevant.  Patient Active Problem List   Diagnosis Date Noted  . Encounter for Medicare annual wellness exam 12/17/2014  . Special screening for malignant neoplasms, colon 12/17/2014  . Routine general medical examination at a health care facility 12/09/2014  . Abdominal aneurysm without mention of rupture 09/11/2013  . S/P total hip arthroplasty 08/02/2013  . H/O splenectomy 08/02/2013  . Closed fracture of femur, neck 07/20/2013  . Occlusion and stenosis of carotid artery without mention of cerebral infarction 08/28/2012  . Carotid stenosis 08/14/2012  . Bruit of left carotid artery 08/01/2012  . Anxiety 01/27/2012  . Osteopenia 07/19/2011  . Prostate cancer screening 07/10/2011  . HOARSENESS, CHRONIC 02/05/2007  . 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 Hyperlipidemia 11/06/2006  . Thrombocytopenia 11/06/2006  . TOBACCO ABUSE 11/06/2006  . COPD 11/06/2006  . COLONIC POLYPS, HX OF 11/06/2006  . PROSTATITIS, HX OF  11/06/2006    Past Medical History  Diagnosis Date  . COPD (chronic obstructive pulmonary disease)   . Hyperlipemia   . Hx of colonic polyps   . 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 History of ITP   . Smoker   . Osteopenia     ?  Marland Kitchen Osteoporosis     dexa 06, declines further dexa or tx   . Carotid artery occlusion   . Fall due to ice or snow Feb. 22, 2015    Partial Right Hip replaced    Past Surgical History  Procedure Laterality Date  . Cataract extraction    . Hemorrhoid surgery    . Splenectomy    . Bone marrow biopsy  05/2003  . Dexa      per patient ? osteopenia  . Tonsillectomy and adenoidectomy Bilateral age 20 per patient  . Joint replacement Right Feb. 22, 2015    Partial Hip    History   Social History  . Marital Status: Married    Spouse Name: N/A  . Number of Children: 6  . Years of Education: N/A   Occupational History  . Ice business    Social History Main Topics  . Smoking status: Current Every Day Smoker -- 0.50 packs/day    Types: Cigarettes  . Smokeless tobacco: Never Used  . Alcohol Use: No  . Drug Use: No  . Sexual Activity: Not on file   Other Topics Concern  . Not on file   Social History Narrative    Family History  Problem Relation Age of  Onset  . Cancer Mother     uterine  . Heart disease Sister     some heart trouble    No Known Allergies  Medication list reviewed and updated in full in [place].  GEN: No fevers, chills. Nontoxic. Primarily MSK c/o today. MSK: Detailed in the HPI GI: tolerating PO intake without difficulty Neuro: No numbness, parasthesias, or tingling associated. Otherwise the pertinent positives of the ROS are noted above.   Objective:   BP 110/64 mmHg  Pulse 75  Temp(Src) 97.3 F (36.3 C) (Oral)  Ht 5' 6.5" (1.689 m)  Wt 121 lb (54.885 kg)  BMI 19.24 kg/m2   GEN: WDWN, NAD, Non-toxic, Alert & Oriented x 3 HEENT: Atraumatic, Normocephalic.  Ears and Nose: No  external deformity. EXTR: No clubbing/cyanosis/edema NEURO: Normal gait.  PSYCH: Normally interactive. Conversant. Not depressed or anxious appearing.  Calm demeanor.    nontender along digits 1 and 2 on the left.  Nontender along digits 4 and 5 on the left.  Nontender along all MCP joints.  Nontender along all metacarpals and in the true wrist.  There is bruising primarily along the third proximal phalanx with swelling and additionally some bruising going into the metacarpal Tenderness is most prominent along the proximal phalanx of the third on the left. No rotational deformities  neurovasc intact  Radiology: Dg Finger Middle Left  12/22/2014   CLINICAL DATA:  Left third finger injury 1 day prior.  EXAM: LEFT MIDDLE FINGER 2+V  COMPARISON:  None.  FINDINGS: There is an oblique non articular fracture of the proximal shaft of the proximal phalanx in the left third finger, with minimal 2 mm pulmonary displacement of the dominant distal fracture fragment, and with surrounding soft tissue swelling. No additional fractures seen in the left third finger. No dislocation or suspicious focal osseous lesion. Minimal osteoarthritis is seen in the proximal interphalangeal joint of the left third finger.  IMPRESSION: Minimally displaced non articular proximal shaft fracture in the left third finger.  These results will be called to the ordering clinician or representative by the Radiologist Assistant, and communication documented in the PACS or zVision Dashboard.   Electronically Signed   By: Ilona Sorrel M.D.   On: 12/22/2014 16:13     Assessment and Plan:   Proximal phalanx fracture of finger, closed, initial encounter - Plan: Ambulatory referral to Hand Surgery  Finger pain, left - Plan: DG Finger Middle Left, Ambulatory referral to Hand Surgery  Oblique fracture of the proximal shaft on the third with some minimal displacement with multiple fracture fragments.  Given location, and the patient's  comorbidities and fracture in the patient's dominant hand, I would like to have hand surgery be involved in this case.  He was stabilized in a long aluminum form splint, and additionally the fourth digit was used in a buddy taping mannered it provide additional stability.  New Prescriptions   No medications on file   Orders Placed This Encounter  Procedures  . DG Finger Middle Left  . Ambulatory referral to Hand Surgery    Signed,  Frederico Hamman T. Naveh Rickles, MD   Patient's Medications  New Prescriptions   No medications on file  Previous Medications   CALCIUM CARBONATE (TUMS EX) 750 MG CHEWABLE TABLET    Chew 1 tablet by mouth daily.   CHOLECALCIFEROL (VITAMIN D) 1000 UNITS TABLET    Take 1,000 Units by mouth daily.   SERTRALINE (ZOLOFT) 100 MG TABLET    TAKE 1/2 TABLET  (  $'50MG'i$ ) EVERY DAY  FOR  DEPRESSION  Modified Medications   No medications on file  Discontinued Medications   No medications on file

## 2014-12-22 NOTE — Progress Notes (Signed)
Pre visit review using our clinic review tool, if applicable. No additional management support is needed unless otherwise documented below in the visit note. 

## 2015-04-21 ENCOUNTER — Ambulatory Visit (INDEPENDENT_AMBULATORY_CARE_PROVIDER_SITE_OTHER): Payer: Commercial Managed Care - HMO

## 2015-04-21 DIAGNOSIS — Z23 Encounter for immunization: Secondary | ICD-10-CM

## 2015-05-01 ENCOUNTER — Ambulatory Visit (INDEPENDENT_AMBULATORY_CARE_PROVIDER_SITE_OTHER): Payer: Commercial Managed Care - HMO | Admitting: Internal Medicine

## 2015-05-01 ENCOUNTER — Encounter: Payer: Self-pay | Admitting: Internal Medicine

## 2015-05-01 VITALS — BP 118/52 | HR 67 | Temp 97.5°F | Wt 123.0 lb

## 2015-05-01 DIAGNOSIS — J441 Chronic obstructive pulmonary disease with (acute) exacerbation: Secondary | ICD-10-CM

## 2015-05-01 MED ORDER — PREDNISONE 20 MG PO TABS
20.0000 mg | ORAL_TABLET | Freq: Two times a day (BID) | ORAL | Status: DC
Start: 2015-05-01 — End: 2015-12-18

## 2015-05-01 MED ORDER — AMOXICILLIN 500 MG PO CAPS
500.0000 mg | ORAL_CAPSULE | Freq: Three times a day (TID) | ORAL | Status: DC
Start: 2015-05-01 — End: 2015-12-18

## 2015-05-01 NOTE — Patient Instructions (Signed)
Please think about quitting smoking. Review the risks we discussed. Please call 1-800-QUIT-NOW (1-800-784-8669) for free smoking cessation counseling. There are multiple options are to help you stop smoking. These include nicotine patches, nicotine gum, and the new "E cigarette".  .  

## 2015-05-01 NOTE — Progress Notes (Signed)
   Subjective:    Patient ID: Daniel Chapman, male    DOB: August 14, 1931, 81 y.o.   MRN: [id number]  HPI  His cough began 11/30 or 12/1. It is associated with intermittent white to brown sputum. He's had minimal itchy eyes and sneezing but denies other upper respiratory tract infection symptoms or extrinsic symptoms.  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 He continues to smoke half a pack a day.  Review of Systems Frontal headache, facial pain , nasal purulence, dental pain, sore throat , otic pain or otic discharge denied. No fever , chills or sweats. Extrinsic symptoms of  angioedema are denied. There is no  Wheezing or  paroxysmal nocturnal dyspnea.      Objective:   Physical Exam Pertinent positive findings include: Facial wasting is present. He has wax in the left otic canal. The maxilla is edentulous. His remaining mandibular teeth anteriorly are severely carious with erosions to and below the gumline. Breath sounds are decreased.Minor scattered rhonchi present.Severe clubbing of nails.  General appearance:Adequately nourished; no acute distress or increased work of breathing is present.    Lymphatic: No  lymphadenopathy about the head, neck, or axilla .  Eyes: No conjunctival inflammation or lid edema is present. There is no scleral icterus.  Ears:  External ear exam shows no significant lesions or deformities.  Otoscopic examination reveals  tympanic membranes are intact bilaterally without bulging, retraction, inflammation or discharge.  Nose:  External nasal examination shows no deformity or inflammation. Nasal mucosa are dry without lesions or exudates No septal dislocation or deviation.No obstruction to airflow.   Oral exam: Dental hygiene as noted; lips and gums are healthy appearing.There is mild oropharyngeal erythema w/o exudate .  Neck:  No deformities, thyromegaly, masses, or tenderness noted.   Supple with full range of motion without pain.   Heart:   Normal rate and regular rhythm. S1 and S2 normal without gallop, murmur, click, rub or other extra sounds.   Extremities:  No cyanosis or edema  noted    Skin: Warm & dry w/o tenting or jaundice. No significant lesions or rash.     Assessment & Plan:  #1 COPD exacerbation without asthmatic component  Plan: See orders and recommendations

## 2015-05-01 NOTE — Progress Notes (Signed)
Pre visit review using our clinic review tool, if applicable. No additional management support is needed unless otherwise documented below in the visit note. 

## 2015-07-31 ENCOUNTER — Encounter: Payer: Self-pay | Admitting: Gastroenterology

## 2015-09-04 ENCOUNTER — Encounter: Payer: Self-pay | Admitting: Family

## 2015-09-15 ENCOUNTER — Encounter: Payer: Self-pay | Admitting: Family

## 2015-09-15 ENCOUNTER — Ambulatory Visit (INDEPENDENT_AMBULATORY_CARE_PROVIDER_SITE_OTHER): Payer: Commercial Managed Care - HMO | Admitting: Family

## 2015-09-15 ENCOUNTER — Ambulatory Visit (HOSPITAL_COMMUNITY)
Admission: RE | Admit: 2015-09-15 | Discharge: 2015-09-15 | Disposition: A | Discharge status: Home / Self Care | Payer: Commercial Managed Care - HMO | Source: Ambulatory Visit | Attending: Family | Admitting: Family

## 2015-09-15 VITALS — BP 114/71 | HR 66 | Ht 66.5 in | Wt 129.4 lb

## 2015-09-15 DIAGNOSIS — Z720 Tobacco use: Secondary | ICD-10-CM | POA: Insufficient documentation

## 2015-09-15 DIAGNOSIS — E785 Hyperlipidemia, unspecified: Secondary | ICD-10-CM | POA: Diagnosis not present

## 2015-09-15 DIAGNOSIS — I6521 Occlusion and stenosis of right carotid artery: Secondary | ICD-10-CM | POA: Diagnosis not present

## 2015-09-15 DIAGNOSIS — F17200 Nicotine dependence, unspecified, uncomplicated: Secondary | ICD-10-CM

## 2015-09-15 DIAGNOSIS — I6523 Occlusion and stenosis of bilateral carotid arteries: Secondary | ICD-10-CM | POA: Insufficient documentation

## 2015-09-15 NOTE — Patient Instructions (Addendum)
Stroke Prevention Some medical conditions and behaviors are associated with an increased chance of having a stroke. You may prevent a stroke by making healthy choices and managing medical conditions. HOW CAN I REDUCE MY RISK OF HAVING A STROKE?   Stay physically active. Get at least 30 minutes of activity on most or all days.  Do not smoke. It may also be helpful to avoid exposure to secondhand smoke.  Limit alcohol use. Moderate alcohol use is considered to be:  No more than 2 drinks per day for men.  No more than 1 drink per day for nonpregnant women.  Eat healthy foods. This involves:  Eating 5 or more servings of fruits and vegetables a day.  Making dietary changes that address high blood pressure (hypertension), high cholesterol, diabetes, or obesity.  Manage your cholesterol levels.  Making food choices that are high in fiber and low in saturated fat, trans fat, and cholesterol may control cholesterol levels.  Take any prescribed medicines to control cholesterol as directed by your health care provider.  Manage your diabetes.  Controlling your carbohydrate and sugar intake is recommended to manage diabetes.  Take any prescribed medicines to control diabetes as directed by your health care provider.  Control your hypertension.  Making food choices that are low in salt (sodium), saturated fat, trans fat, and cholesterol is recommended to manage hypertension.  Ask your health care provider if you need treatment to lower your blood pressure. Take any prescribed medicines to control hypertension as directed by your health care provider.  If you are 18-39 years of age, have your blood pressure checked every 3-5 years. If you are 40 years of age or older, have your blood pressure checked every year.  Maintain a healthy weight.  Reducing calorie intake and making food choices that are low in sodium, saturated fat, trans fat, and cholesterol are recommended to manage  weight.  Stop drug abuse.  Avoid taking birth control pills.  Talk to your health care provider about the risks of taking birth control pills if you are over 35 years old, smoke, get migraines, or have ever had a blood clot.  Get evaluated for sleep disorders (sleep apnea).  Talk to your health care provider about getting a sleep evaluation if you snore a lot or have excessive sleepiness.  Take medicines only as directed by your health care provider.  For some people, aspirin or blood thinners (anticoagulants) are helpful in reducing the risk of forming abnormal blood clots that can lead to stroke. If you have the irregular heart rhythm of atrial fibrillation, you should be on a blood thinner unless there is a good reason you cannot take them.  Understand all your medicine instructions.  Make sure that other conditions (such as anemia or atherosclerosis) are addressed. SEEK IMMEDIATE MEDICAL CARE IF:   You have sudden weakness or numbness of the face, arm, or leg, especially on one side of the body.  Your face or eyelid droops to one side.  You have sudden confusion.  You have trouble speaking (aphasia) or understanding.  You have sudden trouble seeing in one or both eyes.  You have sudden trouble walking.  You have dizziness.  You have a loss of balance or coordination.  You have a sudden, severe headache with no known cause.  You have new chest pain or an irregular heartbeat. Any of these symptoms may represent a serious problem that is an emergency. Do not wait to see if the symptoms will   go away. Get medical help at once. Call your local emergency services (911 in [place] Do not drive yourself to the hospital.   This information is not intended to replace advice given to you by your health care provider. Make sure you discuss any questions you have with your health care provider.   Document Released: 06/23/2004 Document Revised: 06/06/2014 Document Reviewed:  11/16/2012 Elsevier Interactive Patient Education 2016 Elsevier Inc.     Steps to Quit Smoking  Smoking tobacco can be harmful to your health and can affect almost every organ in your body. Smoking puts you, and those around you, at risk for developing many serious chronic diseases. Quitting smoking is difficult, but it is one of the best things that you can do for your health. It is never too late to quit. WHAT ARE THE BENEFITS OF QUITTING SMOKING? When you quit smoking, you lower your risk of developing serious diseases and conditions, such as:  Lung cancer or lung disease, such as COPD.  Heart disease.  Stroke.  Heart attack.  Infertility.  Osteoporosis and bone fractures. Additionally, symptoms such as coughing, wheezing, and shortness of breath may get better when you quit. You may also find that you get sick less often because your body is stronger at fighting off colds and infections. If you are pregnant, quitting smoking can help to reduce your chances of having a baby of low birth weight. HOW DO I GET READY TO QUIT? When you decide to quit smoking, create a plan to make sure that you are successful. Before you quit:  Pick a date to quit. Set a date within the next two weeks to give you time to prepare.  Write down the reasons why you are quitting. Keep this list in places where you will see it often, such as on your bathroom mirror or in your car or wallet.  Identify the people, places, things, and activities that make you want to smoke (triggers) and avoid them. Make sure to take these actions:  Throw away all cigarettes at home, at work, and in your car.  Throw away smoking accessories, such as ashtrays and lighters.  Clean your car and make sure to empty the ashtray.  Clean your home, including curtains and carpets.  Tell your family, friends, and coworkers that you are quitting. Support from your loved ones can make quitting easier.  Talk with your health care  provider about your options for quitting smoking.  Find out what treatment options are covered by your health insurance. WHAT STRATEGIES CAN I USE TO QUIT SMOKING?  Talk with your healthcare provider about different strategies to quit smoking. Some strategies include:  Quitting smoking altogether instead of gradually lessening how much you smoke over a period of time. Research shows that quitting "cold turkey" is more successful than gradually quitting.  Attending in-person counseling to help you build problem-solving skills. You are more likely to have success in quitting if you attend several counseling sessions. Even short sessions of 10 minutes can be effective.  Finding resources and support systems that can help you to quit smoking and remain smoke-free after you quit. These resources are most helpful when you use them often. They can include:  Online chats with a counselor.  Telephone quitlines.  Printed self-help materials.  Support groups or group counseling.  Text messaging programs.  Mobile phone applications.  Taking medicines to help you quit smoking. (If you are pregnant or breastfeeding, talk with your health care provider first.) Some   medicines contain nicotine and some do not. Both types of medicines help with cravings, but the medicines that include nicotine help to relieve withdrawal symptoms. Your health care provider may recommend:  Nicotine patches, gum, or lozenges.  Nicotine inhalers or sprays.  Non-nicotine medicine that is taken by mouth. Talk with your health care provider about combining strategies, such as taking medicines while you are also receiving in-person counseling. Using these two strategies together makes you more likely to succeed in quitting than if you used either strategy on its own. If you are pregnant or breastfeeding, talk with your health care provider about finding counseling or other support strategies to quit smoking. Do not take  medicine to help you quit smoking unless told to do so by your health care provider. WHAT THINGS CAN I DO TO MAKE IT EASIER TO QUIT? Quitting smoking might feel overwhelming at first, but there is a lot that you can do to make it easier. Take these important actions:  Reach out to your family and friends and ask that they support and encourage you during this time. Call telephone quitlines, reach out to support groups, or work with a counselor for support.  Ask people who smoke to avoid smoking around you.  Avoid places that trigger you to smoke, such as bars, parties, or smoke-break areas at work.  Spend time around people who do not smoke.  Lessen stress in your life, because stress can be a smoking trigger for some people. To lessen stress, try:  Exercising regularly.  Deep-breathing exercises.  Yoga.  Meditating.  Performing a body scan. This involves closing your eyes, scanning your body from head to toe, and noticing which parts of your body are particularly tense. Purposefully relax the muscles in those areas.  Download or purchase mobile phone or tablet apps (applications) that can help you stick to your quit plan by providing reminders, tips, and encouragement. There are many free apps, such as QuitGuide from the CDC (Centers for Disease Control and Prevention). You can find other support for quitting smoking (smoking cessation) through smokefree.gov and other websites. HOW WILL I FEEL WHEN I QUIT SMOKING? Within the first 24 hours of quitting smoking, you may start to feel some withdrawal symptoms. These symptoms are usually most noticeable 2-3 days after quitting, but they usually do not last beyond 2-3 weeks. Changes or symptoms that you might experience include:  Mood swings.  Restlessness, anxiety, or irritation.  Difficulty concentrating.  Dizziness.  Strong cravings for sugary foods in addition to nicotine.  Mild weight  gain.  Constipation.  Nausea.  Coughing or a sore throat.  Changes in how your medicines work in your body.  A depressed mood.  Difficulty sleeping (insomnia). After the first 2-3 weeks of quitting, you may start to notice more positive results, such as:  Improved sense of smell and taste.  Decreased coughing and sore throat.  Slower heart rate.  Lower blood pressure.  Clearer skin.  The ability to breathe more easily.  Fewer sick days. Quitting smoking is very challenging for most people. Do not get discouraged if you are not successful the first time. Some people need to make many attempts to quit before they achieve long-term success. Do your best to stick to your quit plan, and talk with your health care provider if you have any questions or concerns.   This information is not intended to replace advice given to you by your health care provider. Make sure you discuss any questions   you have with your health care provider.   Document Released: 05/10/2001 Document Revised: 09/30/2014 Document Reviewed: 09/30/2014 Elsevier Interactive Patient Education 2016 Elsevier Inc.  

## 2015-09-15 NOTE — Progress Notes (Signed)
Chief Complaint: Extracranial Carotid Artery Stenosis   History of Present Illness  Daniel Chapman is a 82 y.o. male patient who was referred to Dr. Donnetta Hutching, and who was found to have a carotid bruit and underwent a duplex for further evaluation of this. Dr. Donnetta Hutching reviewed his [place] from 08/10/2012 which was done at [facility] heart care. 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 Patient has not had previous carotid artery intervention.  He is left-handed. He specifically denies any prior hemispheric symptoms. Specifically denies hemiparesis, amaurosis fugax, or any aphasia. He does have a long history of cigarette smoking.  He had vocal cord cancer with radiation tx in 2008.  He returns today for follow up. He had a strongly palpable aortic pulse at his visit on 09/03/2013; aortic Duplex later in April of 2015 revealed no aneurysm of the abdominal aorta. He denies any known personal or family history of aneurysms. He denies back or abdominal pain.  He fell and fractured his right hip on 07/21/2013, had a partial right hip replacement.   He denies claudication symptoms with walking.   Pt Diabetic: No  Pt smoker: smoking one cigarette/day  Pt meds include:  Statin : No: states his cholesterol is good  ASA: No: he has a history of ITP.  Other anticoagulants/antiplatelets: no    Past Medical History  Diagnosis Date  . COPD (chronic obstructive pulmonary disease) ([place])   . Hyperlipemia   . Hx of colonic polyps   . Carcinoma of vocal cord [facility]) 2008    radiation therapy  . History of ITP   . Smoker   . Osteopenia     ?  Marland Kitchen Osteoporosis     dexa 06, declines further dexa or tx   . Carotid artery occlusion   . Fall due to ice or snow Feb. 22, 2015    Partial Right Hip replaced    Social History Social History  Substance Use Topics  . Smoking status: Current Every Day Smoker -- 0.50  packs/day    Types: Cigarettes  . Smokeless tobacco: Never Used  . Alcohol Use: No    Family History Family History  Problem Relation Age of Onset  . Uterine cancer Mother   . Heart disease Sister     some heart trouble    Surgical History Past Surgical History  Procedure Laterality Date  . Cataract extraction    . Hemorrhoid surgery    . Splenectomy    . Bone marrow biopsy  05/2003  . Dexa      per patient ? osteopenia  . Tonsillectomy and adenoidectomy Bilateral age 63 per patient  . Joint replacement Right Feb. 22, 2015    Partial Hip    No Known Allergies  Current Outpatient Prescriptions  Medication Sig Dispense Refill  . sertraline (ZOLOFT) 100 MG tablet TAKE 1/2 TABLET  (50MG) EVERY DAY  FOR  DEPRESSION 45 tablet 3  . amoxicillin (AMOXIL) 500 MG capsule Take 1 capsule (500 mg total) by mouth 3 (three) times daily. (Patient not taking: Reported on 09/15/2015) 30 capsule 0  . calcium carbonate (TUMS EX) 750 MG chewable tablet Chew 1 tablet by mouth daily. Reported on 09/15/2015    . cholecalciferol (VITAMIN D) 1000 UNITS tablet Take 1,000 Units by mouth daily. Reported on 09/15/2015    . predniSONE (DELTASONE) 20 MG tablet Take 1 tablet (20 mg total) by mouth 2 (two) times  daily. (Patient not taking: Reported on 09/15/2015) 14 tablet 0   No current facility-administered medications for this visit.    Review of Systems : See HPI for pertinent positives and negatives.  Physical Examination  Filed Vitals:   09/15/15 1114 09/15/15 1119  BP: 121/67 114/71  Pulse: 66   Height: 5' 6.5" (1.689 m)   Weight: 129 lb 6.4 oz (58.695 kg)   SpO2: 97%    Body mass index is 20.58 kg/(m^2).  General: WDWN thin animated male in NAD   GAIT: normal   Eyes: PERRLA   Pulmonary: Respirations are non-labored, CTAB Cardiac: regular rhythm, no detected murmur.   VASCULAR EXAM  Carotid Bruits  Left  Right    Negative  positive   Aorta is palpable.  Radial  pulses are 3+ palpable and equal.    LE Pulses  LEFT  RIGHT   FEMORAL  palpable  palpable   POPLITEAL  not palpable  not palpable   POSTERIOR TIBIAL  not palpable  not palpable   DORSALIS PEDIS  ANTERIOR TIBIAL  palpable  palpable    Gastrointestinal: soft, nontender, BS WNL, no r/g, no palpable masses.   Musculoskeletal: No muscle atrophy/wasting. M/S 5/5 throughout, Extremities without ischemic changes.   Neurologic: A&O X 3; Appropriate Affect, Speech is normal  CN 2-12 intact except has some hearing loss, Pain and light touch intact in extremities, Motor exam as listed above.                 Non-Invasive Vascular Imaging CAROTID DUPLEX 09/15/2015   CEREBROVASCULAR DUPLEX EVALUATION    INDICATION: Carotid artery disease    PREVIOUS INTERVENTION(S): Known occlusion of right internal carotid artery    DUPLEX EXAM: Carotid duplex    RIGHT  LEFT  Peak Systolic Velocities (cm/s) End Diastolic Velocities (cm/s) Plaque LOCATION Peak Systolic Velocities (cm/s) End Diastolic Velocities (cm/s) Plaque  76 11 HT CCA PROXIMAL 103 27   105 11 HT CCA MID 65 20 HT  57 10 HT CCA DISTAL 60 20 HT  244 47 HT ECA Occluded - HT  Occluded - HT ICA PROXIMAL 114 46 HT  Occluded - HT ICA MID 93 36   Occluded - HT ICA DISTAL 104 36     N/A ICA / CCA Ratio (PSV) 1.7  Antegrade Vertebral Flow Antegrade  - Brachial Systolic Pressure (mmHg) -  Triphasic Brachial Artery Waveforms Triphasic    Plaque Morphology:  HM = Homogeneous, HT = Heterogeneous, CP = Calcific Plaque, SP = Smooth Plaque, IP = Irregular Plaque     ADDITIONAL FINDINGS: Multiphasic subclavian arteries    IMPRESSION: Known right internal carotid artery occlusion Less than 40% left internal carotid artery stenosis. Left external carotid artery not well visualized, appears occluded and appears to be fed by branch    Compared to the previous exam:  No change in left internal carotid  artery since exam of 09/09/2014      Assessment: Daniel Chapman is a 82 y.o. male who has a known occlusion of his right internal carotid artery and minimal stenosis of the left internal carotid artery. Patient has not had previous carotid artery intervention.  He is left-handed. He has no hx of stroke or TIA. He does have a long history of cigarette smoking.   Today's carotid Duplex suggests a confirmed occlusion of right internal carotid artery, <40% stenosis of the left internal carotid artery, and significant stenosis in the left external carotid artery. No significant change compared  to prior Duplex.    Plan: Follow-up in 1 year with Carotid Duplex scan.   I discussed in depth with the patient the nature of atherosclerosis, and emphasized the importance of maximal medical management including strict control of blood pressure, blood glucose, and lipid levels, obtaining regular exercise, and cessation of smoking.  The patient is aware that without maximal medical management the underlying atherosclerotic disease process will progress, limiting the benefit of any interventions. The patient was given information about stroke prevention and what symptoms should prompt the patient to seek immediate medical care. Thank you for allowing [place] to participate in this patient's care.  Clemon Chambers, RN, MSN, FNP-C [facility] Office: [phone]  Clinic Physician: Early  09/15/2015 11:36 AM

## 2015-10-23 ENCOUNTER — Other Ambulatory Visit: Payer: Self-pay | Admitting: *Deleted

## 2015-10-23 DIAGNOSIS — I6523 Occlusion and stenosis of bilateral carotid arteries: Secondary | ICD-10-CM

## 2015-12-13 ENCOUNTER — Telehealth: Payer: Self-pay | Admitting: Family Medicine

## 2015-12-13 DIAGNOSIS — Z0000 Encounter for general adult medical examination without abnormal findings: Secondary | ICD-10-CM

## 2015-12-13 NOTE — Telephone Encounter (Signed)
-----   Message from Ellamae Sia sent at 12/10/2015 10:35 AM EDT ----- Regarding: Lab orders for Friday, 7.21.17 Patient is scheduled for CPX labs, please order future labs, Thanks , Karna Christmas

## 2015-12-18 ENCOUNTER — Ambulatory Visit (INDEPENDENT_AMBULATORY_CARE_PROVIDER_SITE_OTHER): Payer: Commercial Managed Care - HMO

## 2015-12-18 ENCOUNTER — Other Ambulatory Visit (INDEPENDENT_AMBULATORY_CARE_PROVIDER_SITE_OTHER): Payer: Commercial Managed Care - HMO

## 2015-12-18 VITALS — BP 100/74 | HR 76 | Temp 97.9°F | Ht 66.5 in | Wt 119.2 lb

## 2015-12-18 DIAGNOSIS — Z0000 Encounter for general adult medical examination without abnormal findings: Secondary | ICD-10-CM | POA: Diagnosis not present

## 2015-12-18 LAB — COMPREHENSIVE METABOLIC PANEL
ALK PHOS: 57 U/L (ref 39–117)
ALT: 12 U/L (ref 0–53)
AST: 19 U/L (ref 0–37)
Albumin: 3.8 g/dL (ref 3.5–5.2)
BUN: 21 mg/dL (ref 6–23)
CO2: 25 mEq/L (ref 19–32)
Calcium: 9.5 mg/dL (ref 8.4–10.5)
Chloride: 105 mEq/L (ref 96–112)
Creatinine, Ser: 0.98 mg/dL (ref 0.40–1.50)
GFR: 77.78 mL/min (ref >60.00)
Glucose, Bld: 92 mg/dL (ref 70–99)
POTASSIUM: 4.1 meq/L (ref 3.5–5.1)
SODIUM: 138 meq/L (ref 135–145)
TOTAL PROTEIN: 7.5 g/dL (ref 6.0–8.3)
Total Bilirubin: 0.5 mg/dL (ref 0.2–1.2)

## 2015-12-18 LAB — LIPID PANEL
Cholesterol: 147 mg/dL (ref 0–200)
HDL: 30.5 mg/dL — ABNORMAL LOW (ref >39.00)
LDL Cholesterol: 93 mg/dL (ref 0–99)
NonHDL: 116.29
Total CHOL/HDL Ratio: 5
Triglycerides: 115 mg/dL (ref 0.0–149.0)
VLDL: 23 mg/dL (ref 0.0–40.0)

## 2015-12-18 LAB — CBC WITH DIFFERENTIAL/PLATELET
Basophils Absolute: 0.1 10*3/uL (ref 0.0–0.1)
Basophils Relative: 0.7 % (ref 0.0–3.0)
EOS ABS: 0.1 10*3/uL (ref 0.0–0.7)
Eosinophils Relative: 1 % (ref 0.0–5.0)
HCT: 39 % (ref 39.0–52.0)
Hemoglobin: 12.6 g/dL — ABNORMAL LOW (ref 13.0–17.0)
LYMPHS ABS: 2.8 10*3/uL (ref 0.7–4.0)
Lymphocytes Relative: 32 % (ref 12.0–46.0)
MCHC: 32.3 g/dL (ref 30.0–36.0)
MCV: 90.9 fl (ref 78.0–100.0)
Monocytes Absolute: 1 10*3/uL (ref 0.1–1.0)
Monocytes Relative: 10.8 % (ref 3.0–12.0)
NEUTROS PCT: 55.5 % (ref 43.0–77.0)
Neutro Abs: 4.9 10*3/uL (ref 1.4–7.7)
PLATELETS: 203 10*3/uL (ref 150.0–400.0)
RBC: 4.3 Mil/uL (ref 4.22–5.81)
RDW: 15.3 % (ref 11.5–15.5)
WBC: 8.9 10*3/uL (ref 4.0–10.5)

## 2015-12-18 LAB — TSH: TSH: 1.39 u[IU]/mL (ref 0.35–4.50)

## 2015-12-18 NOTE — Progress Notes (Signed)
Pre visit review using our clinic review tool, if applicable. No additional management support is needed unless otherwise documented below in the visit note. 

## 2015-12-18 NOTE — Patient Instructions (Signed)
Daniel Chapman , Thank you for taking time to come for your Medicare Wellness Visit. I appreciate your ongoing commitment to your health goals. Please review the following plan we discussed and let me know if I can assist you in the future.   These are the goals we discussed: Goals    . keep working     Starting 12/18/2015, I will continue to work 1-2 hours daily as long as possible.        This is a list of the screening recommended for you and due dates:  Health Maintenance  Topic Date Due  . Flu Shot  12/29/2015  . Tetanus Vaccine  04/07/2022  . Shingles Vaccine  Completed  . Pneumonia vaccines  Completed    Preventive Care for Adults  A healthy lifestyle and preventive care can promote health and wellness. Preventive health guidelines for adults include the following key practices.  . A routine yearly physical is a good way to check with your health care provider about your health and preventive screening. It is a chance to share any concerns and updates on your health and to receive a thorough exam.  . Visit your dentist for a routine exam and preventive care every 6 months. Brush your teeth twice a day and floss once a day. Good oral hygiene prevents tooth decay and gum disease.  . The frequency of eye exams is based on your age, health, family medical history, use  of contact lenses, and other factors. Follow your health care provider's ecommendations for frequency of eye exams.  . Eat a healthy diet. Foods like vegetables, fruits, whole grains, low-fat dairy products, and lean protein foods contain the nutrients you need without too many calories. Decrease your intake of foods high in solid fats, added sugars, and salt. Eat the right amount of calories for you. Get information about a proper diet from your health care provider, if necessary.  . Regular physical exercise is one of the most important things you can do for your health. Most adults should get at least 150 minutes of  moderate-intensity exercise (any activity that increases your heart rate and causes you to sweat) each week. In addition, most adults need muscle-strengthening exercises on 2 or more days a week.  Silver Sneakers may be a benefit available to you. To determine eligibility, you may visit the website: www.silversneakers.com or contact program at (864)610-8998 Mon-Fri between 8AM-8PM.   . Maintain a healthy weight. The body mass index (BMI) is a screening tool to identify possible weight problems. It provides an estimate of body fat based on height and weight. Your health care provider can find your BMI and can help you achieve or maintain a healthy weight.   For adults 20 years and older: ? A BMI below 18.5 is considered underweight. ? A BMI of 18.5 to 24.9 is normal. ? A BMI of 25 to 29.9 is considered overweight. ? A BMI of 30 and above is considered obese.   . Maintain normal blood lipids and cholesterol levels by exercising and minimizing your intake of saturated fat. Eat a balanced diet with plenty of fruit and vegetables. Blood tests for lipids and cholesterol should begin at age 27 and be repeated every 5 years. If your lipid or cholesterol levels are high, you are over 50, or you are at high risk for heart disease, you may need your cholesterol levels checked more frequently. Ongoing high lipid and cholesterol levels should be treated with medicines if diet  and exercise are not working.  . If you smoke, find out from your health care provider how to quit. If you do not use tobacco, please do not start.  . If you choose to drink alcohol, please do not consume more than 2 drinks per day. One drink is considered to be 12 ounces (355 mL) of beer, 5 ounces (148 mL) of wine, or 1.5 ounces (44 mL) of liquor.  . If you are 59-72 years old, ask your health care provider if you should take aspirin to prevent strokes.  . Use sunscreen. Apply sunscreen liberally and repeatedly throughout the day. You  should seek shade when your shadow is shorter than you. Protect yourself by wearing long sleeves, pants, a wide-brimmed hat, and sunglasses year round, whenever you are outdoors.  . Once a month, do a whole body skin exam, using a mirror to look at the skin on your back. Tell your health care provider of new moles, moles that have irregular borders, moles that are larger than a pencil eraser, or moles that have changed in shape or color.

## 2015-12-18 NOTE — Progress Notes (Signed)
Subjective:   NATASHA PAULSON is a 82 y.o. male who presents for Medicare Annual/Subsequent preventive examination.  Review of Systems:  N/A Cardiac Risk Factors include: advanced age (>81mn, >>32women);male gender;smoking/ tobacco exposure;dyslipidemia     Objective:    Vitals: BP 100/74 mmHg  Pulse 76  Temp(Src) 97.9 F (36.6 C) (Oral)  Ht 5' 6.5" (1.689 m)  Wt 119 lb 4 oz (54.091 kg)  BMI 18.96 kg/m2  SpO2 96%  Body mass index is 18.96 kg/(m^2).  Tobacco History  Smoking status  . Current Every Day Smoker -- 0.50 packs/day  . Types: Cigarettes  Smokeless tobacco  . Never Used     Ready to quit: No Counseling given: No   Past Medical History  Diagnosis Date  . COPD (chronic obstructive pulmonary disease) ([place]   . Hyperlipemia   . Hx of colonic polyps   . 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 History of ITP   . Smoker   . Osteopenia     ?  .Marland KitchenOsteoporosis     dexa 06, declines further dexa or tx   . Carotid artery occlusion   . Fall due to ice or snow Feb. 22, 2015    Partial Right Hip replaced   Past Surgical History  Procedure Laterality Date  . Cataract extraction    . Hemorrhoid surgery    . Splenectomy    . Bone marrow biopsy  05/2003  . Dexa      per patient ? osteopenia  . Tonsillectomy and adenoidectomy Bilateral age [age]per patient  . Joint replacement Right Feb. 22, 2015    Partial Hip   Family History  Problem Relation Age of Onset  . Uterine cancer Mother   . Heart disease Sister     some heart trouble   History  Sexual Activity  . Sexual Activity: No    Outpatient Encounter Prescriptions as of 12/18/2015  Medication Sig  . calcium carbonate (TUMS EX) 750 MG chewable tablet Chew 1 tablet by mouth daily. Reported on 09/15/2015  . cholecalciferol (VITAMIN D) 1000 UNITS tablet Take 1,000 Units by mouth daily. Reported on 09/15/2015  . sertraline (ZOLOFT) 100 MG tablet TAKE 1/2 TABLET  (50MG) EVERY DAY  FOR   DEPRESSION  . [DISCONTINUED] amoxicillin (AMOXIL) 500 MG capsule Take 1 capsule (500 mg total) by mouth 3 (three) times daily. (Patient not taking: Reported on 09/15/2015)  . [DISCONTINUED] predniSONE (DELTASONE) 20 MG tablet Take 1 tablet (20 mg total) by mouth 2 (two) times daily. (Patient not taking: Reported on 09/15/2015)   No facility-administered encounter medications on file as of 12/18/2015.    Activities of Daily Living In your present state of health, do you have any difficulty performing the following activities: 12/18/2015  Hearing? Y  Vision? N  Difficulty concentrating or making decisions? Y  Walking or climbing stairs? Y  Dressing or bathing? N  Doing errands, shopping? N  Preparing Food and eating ? N  Using the Toilet? N  In the past six months, have you accidently leaked urine? N  Do you have problems with loss of bowel control? N  Managing your Medications? N  Managing your Finances? Y  Housekeeping or managing your Housekeeping? Y    Patient Care Team: MAbner Greenspan MD as PCP - General   Assessment:     Visual Acuity Screening   Right eye Left eye Both eyes  Without correction: 20/20-1 20/30  20/30-1  With correction:     Hearing Screening Comments: Pt has bilateral hearing aids but chooses not to wear them   Exercise Activities and Dietary recommendations Current Exercise Habits: The patient has a physically strenous job, but has no regular exercise apart from work. 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 keep working     Starting 12/18/2015, I will continue to work 1-2 hours daily as long as possible.       Fall Risk Fall Risk  12/18/2015 12/17/2014 08/02/2012  Falls in the past year? No No Yes  Number falls in past yr: - - 1   Depression Screen PHQ 2/9 Scores 12/18/2015 12/17/2014 08/02/2012  PHQ - 2 Score 0 0 0    Cognitive Testing MMSE - Mini Mental State Exam 12/18/2015  Orientation to time 5    Orientation to Place 5  Registration 3  Attention/ Calculation 0  Recall 3  Language- name 2 objects 0  Language- repeat 1  Language- follow 3 step command 3  Language- read & follow direction 0  Write a sentence 0  Copy design 0  Total score 20   PLEASE NOTE: A Mini-Cog screen was completed. Maximum score is 20. A value of 0 denotes this part of Folstein MMSE was not completed or the patient failed this part of the Mini-Cog screening.   Mini-Cog Screening Orientation to Time - Max 5 pts Orientation to Place - Max 5 pts Registration - Max 3 pts Recall - Max 3 pts Language Repeat - Max 1 pts Language Follow 3 Step Command - Max 3 pts  Immunization History  Administered Date(s) Administered  . Influenza Split 04/06/2011, 03/07/2012  . Influenza Whole 11/26/2004, 04/06/2007, 03/27/2008, 05/08/2009, 03/09/2010  . Influenza,inj,Quad PF,36+ Mos 03/14/2013, 03/19/2014, 04/21/2015  . PPD Test 07/24/2013  . Pneumococcal Conjugate-13 12/17/2014  . Pneumococcal Polysaccharide-23 04/30/2003, 04/06/2007  . Td 09/28/2002  . Zoster 02/28/2014   Screening Tests Health Maintenance  Topic Date Due  . INFLUENZA VACCINE  12/29/2015  . TETANUS/TDAP  04/07/2022  . ZOSTAVAX  Completed  . PNA vac Low Risk Adult  Completed      Plan:     I have personally reviewed and addressed the Medicare Annual Wellness questionnaire and have noted the following in the patient's chart:  A. Medical and social history B. Use of alcohol, tobacco or illicit drugs  C. Current medications and supplements D. Functional ability and status E.  Nutritional status F.  Physical activity G. Advance directives H. List of other physicians I.  Hospitalizations, surgeries, and ER visits in previous 12 months J.  Marissa to include hearing, vision, cognitive, depression L. Referrals and appointments - none  In addition, I have reviewed and discussed with patient certain preventive protocols, quality  metrics, and best practice recommendations. A written personalized care plan for preventive services as well as general preventive health recommendations were provided to patient.  See attached scanned questionnaire for additional information.   Signed,   Lindell Noe, MHA, BS, LPN Health Advisor

## 2015-12-18 NOTE — Progress Notes (Signed)
PCP notes:  Health maintenance: No gaps identified or addressed  Abnormal screenings: None  Patient concerns: None  Nurse concerns: None  Next PCP appt: 01/04/16 @ 1215  I reviewed health advisor's note, was available for consultation, and agree with documentation and plan. Loura Pardon MD

## 2016-01-04 ENCOUNTER — Encounter: Payer: Self-pay | Admitting: Family Medicine

## 2016-01-04 ENCOUNTER — Ambulatory Visit (INDEPENDENT_AMBULATORY_CARE_PROVIDER_SITE_OTHER): Payer: Commercial Managed Care - HMO | Admitting: Family Medicine

## 2016-01-04 VITALS — BP 102/70 | HR 77 | Temp 97.3°F | Ht 66.5 in | Wt 121.5 lb

## 2016-01-04 DIAGNOSIS — E785 Hyperlipidemia, unspecified: Secondary | ICD-10-CM | POA: Diagnosis not present

## 2016-01-04 DIAGNOSIS — F17200 Nicotine dependence, unspecified, uncomplicated: Secondary | ICD-10-CM

## 2016-01-04 DIAGNOSIS — J449 Chronic obstructive pulmonary disease, unspecified: Secondary | ICD-10-CM | POA: Diagnosis not present

## 2016-01-04 DIAGNOSIS — Z0000 Encounter for general adult medical examination without abnormal findings: Secondary | ICD-10-CM

## 2016-01-04 DIAGNOSIS — D696 Thrombocytopenia, unspecified: Secondary | ICD-10-CM

## 2016-01-04 DIAGNOSIS — M8580 Other specified disorders of bone density and structure, unspecified site: Secondary | ICD-10-CM

## 2016-01-04 NOTE — Assessment & Plan Note (Signed)
Disc goals for lipids and reasons to control them Rev labs with pt Rev low sat fat diet in detail   

## 2016-01-04 NOTE — Assessment & Plan Note (Signed)
Pt has no breathing complaints and smokes 1/2 ppd Wishes to quit some day-not successful yet  Denies need for an inhaler Will continue to follow

## 2016-01-04 NOTE — Assessment & Plan Note (Signed)
This is resolved currently  Will continue to follow

## 2016-01-04 NOTE — Assessment & Plan Note (Signed)
Disc in detail risks of smoking and possible outcomes including copd, vascular/ heart disease, cancer , respiratory and sinus infections  Pt voices understanding Pt states he is not ready to quit -despite hx of throat cancer in the past and copd Urged him to keep thinking about it

## 2016-01-04 NOTE — Progress Notes (Signed)
Pre visit review using our clinic review tool, if applicable. No additional management support is needed unless otherwise documented below in the visit note. 

## 2016-01-04 NOTE — Progress Notes (Signed)
Subjective:    Patient ID: Daniel Chapman, male    DOB: [birth year], 82 y.o.   MRN: [id number]  HPI  Here for health maintenance exam and to review chronic medical problems    Saw Lesia for AMW last month He has hearing loss-does not like to wear his hearing aides  No areas of concern  Got a puppy recently -enjoying him / training him - so good so far   He is doing fairly well overall    Wt Readings from Last 3 Encounters:  01/04/16 121 lb 8 oz (55.1 kg)  12/18/15 119 lb 4 oz (54.1 kg)  09/15/15 129 lb 6.4 oz (58.7 kg)   bmi is 19.3 Eats a lot! (per pt) - and does not gain weight  Has trouble swallowing bread since his cancer tx - otherwise does pretty good   dexa 2/06 osteopenia -declined further dexa after that  He takes his vitamin D Very active  Smoker Has had one hip fracture   No falls this year   Colonoscopy 1/10-had polyps Has out aged colon cancer screening now  No bowel problems besides hemorrhoids -has banded them  No straining   Also out aged prostate screen Lab Results  Component Value Date   PSA 0.36 12/10/2014   PSA 0.48 07/11/2011   PSA 0.41 05/12/2010   these were always low and stable  Nocturia - depends if he drinks tea right before bed     Smoking status  Hx of copd/no treatment  Smokes 1/2 ppd- keeps trying to quit   Hx of vocal cord carcinoma treated   Takes zoloft for mood/hx of depression  Has his ups and downs with mood as usual  A lot of stress with job- equipment keeps breaking - very $$- financial stressors  He keeps on going   Takes vit D every day and does not miss it   Hx of carotid stenosis Had doppler 5/17  Hx of low platelets Lab Results  Component Value Date   WBC 8.9 12/18/2015   HGB 12.6 (L) 12/18/2015   HCT 39.0 12/18/2015   MCV 90.9 12/18/2015   PLT 203.0 12/18/2015     Hx of hyperlipidemia Lab Results  Component Value Date   CHOL 147 12/18/2015   CHOL 132 12/10/2014   CHOL 154 08/01/2012   Lab  Results  Component Value Date   HDL 30.50 (L) 12/18/2015   HDL 31.10 (L) 12/10/2014   HDL 25.50 (L) 08/01/2012   Lab Results  Component Value Date   LDLCALC 93 12/18/2015   LDLCALC 83 12/10/2014   LDLCALC 94 08/01/2012   Lab Results  Component Value Date   TRIG 115.0 12/18/2015   TRIG 91.0 12/10/2014   TRIG 174.0 (H) 08/01/2012   Lab Results  Component Value Date   CHOLHDL 5 12/18/2015   CHOLHDL 4 12/10/2014   CHOLHDL 6 08/01/2012   No results found for: LDLDIRECT   Nl [place] of Aorta in 2015  Patient Active Problem List   Diagnosis Date Noted  . Encounter for Medicare annual wellness exam 12/17/2014  . Special screening for malignant neoplasms, colon 12/17/2014  . Routine general medical examination at a health care facility 12/09/2014  . Abdominal aneurysm without mention of rupture 09/11/2013  . S/P total hip arthroplasty 08/02/2013  . H/O splenectomy 08/02/2013  . Closed fracture of femur, neck ([place]) 07/20/2013  . Occlusion and stenosis of carotid artery without mention of cerebral infarction 08/28/2012  . Carotid stenosis  08/14/2012  . Bruit of left carotid artery 08/01/2012  . Anxiety 01/27/2012  . Osteopenia 07/19/2011  . Prostate cancer screening 07/10/2011  . HOARSENESS, CHRONIC 02/05/2007  . 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 Hyperlipidemia 11/06/2006  . Thrombocytopenia ([place]) 11/06/2006  . TOBACCO ABUSE 11/06/2006  . COPD (chronic obstructive pulmonary disease) ([place]) 11/06/2006  . COLONIC POLYPS, HX OF 11/06/2006  . PROSTATITIS, HX OF 11/06/2006   Past Medical History:  Diagnosis Date  . Carcinoma of vocal cord [facility]) 2008   radiation therapy  . Carotid artery occlusion   . COPD (chronic obstructive pulmonary disease) ([place])   . Fall due to ice or snow Feb. 22, 2015   Partial Right Hip replaced  . History of ITP   . Hx of colonic polyps   . Hyperlipemia   . Osteopenia    ?  Marland Kitchen Osteoporosis    dexa 06, declines further dexa or tx     . Smoker    Past Surgical History:  Procedure Laterality Date  . BONE MARROW BIOPSY  05/2003  . CATARACT EXTRACTION    . Dexa     per patient ? osteopenia  . HEMORRHOID SURGERY    . JOINT REPLACEMENT Right Feb. 22, 2015   Partial Hip  . SPLENECTOMY    . TONSILLECTOMY AND ADENOIDECTOMY Bilateral age 27 per patient   Social History  Substance Use Topics  . Smoking status: Current Every Day Smoker    Packs/day: 0.50    Types: Cigarettes  . Smokeless tobacco: Never Used  . Alcohol use No   Family History  Problem Relation Age of Onset  . Uterine cancer Mother   . Heart disease Sister     some heart trouble   No Known Allergies Current Outpatient Prescriptions on File Prior to Visit  Medication Sig Dispense Refill  . calcium carbonate (TUMS EX) 750 MG chewable tablet Chew 1 tablet by mouth daily. Reported on 09/15/2015    . cholecalciferol (VITAMIN D) 1000 UNITS tablet Take 1,000 Units by mouth daily. Reported on 09/15/2015    . sertraline (ZOLOFT) 100 MG tablet TAKE 1/2 TABLET  ('50MG'$ ) EVERY DAY  FOR  DEPRESSION 45 tablet 3   No current facility-administered medications on file prior to visit.     Review of Systems    Review of Systems  Constitutional: Negative for fever, appetite change, fatigue and unexpected weight change.  Eyes: Negative for pain and visual disturbance.  ENT pos for hearing loss  Respiratory: Negative for cough and shortness of breath.   Cardiovascular: Negative for cp or palpitations    Gastrointestinal: Negative for nausea, diarrhea and constipation.  Genitourinary: Negative for urgency and frequency.  Skin: Negative for pallor or rash   Neurological: Negative for weakness, light-headedness, numbness and headaches.  Hematological: Negative for adenopathy. Does not bruise/bleed easily.  Psychiatric/Behavioral: Negative for dysphoric mood. The patient is not nervous/anxious.   Pos for stressors     Objective:   Physical Exam  Constitutional: He  appears well-developed and well-nourished. No distress.  Slim and well appearing  HENT:  Head: Normocephalic and atraumatic.  Right Ear: External ear normal.  Left Ear: External ear normal.  Nose: Nose normal.  Mouth/Throat: Oropharynx is clear and moist.  Baseline hoarseness  Eyes: Conjunctivae and EOM are normal. Pupils are equal, round, and reactive to light. Right eye exhibits no discharge. Left eye exhibits no discharge. No scleral icterus.  Neck: Normal range of motion. Neck supple. No JVD present. Carotid  bruit is not present. No thyromegaly present.  Cardiovascular: Normal rate, regular rhythm, normal heart sounds and intact distal pulses.  Exam reveals no gallop.   Pulmonary/Chest: Effort normal and breath sounds normal. No respiratory distress. He has no wheezes. He exhibits no tenderness.  Diffusely distant bs   Abdominal: Soft. Bowel sounds are normal. He exhibits no distension, no abdominal bruit and no mass. There is no tenderness.  Musculoskeletal: He exhibits no edema or tenderness.  Lymphadenopathy:    He has no cervical adenopathy.  Neurological: He is alert. He has normal reflexes. No cranial nerve deficit. He exhibits normal muscle tone. Coordination normal.  Skin: Skin is warm and dry. No rash noted. No erythema. No pallor.  Psychiatric: He has a normal mood and affect.          Assessment & Plan:   Problem List Items Addressed This Visit      Respiratory   COPD (chronic obstructive pulmonary disease) ([place]) - Primary    Pt has no breathing complaints and smokes 1/2 ppd Wishes to quit some day-not successful yet  Denies need for an inhaler Will continue to follow        Musculoskeletal and Integument   Osteopenia    Declines dexa  Takes daily vitamin D No new falls  Hip fx in the past  Disc need for calcium/ vitamin D/ wt bearing exercise and bone density test every 2 y to monitor Disc safety/ fracture risk in detail          Other   TOBACCO  ABUSE    Disc in detail risks of smoking and possible outcomes including copd, vascular/ heart disease, cancer , respiratory and sinus infections  Pt voices understanding Pt states he is not ready to quit -despite hx of throat cancer in the past and copd Urged him to keep thinking about it       Thrombocytopenia ([place])    This is resolved currently  Will continue to follow       Routine general medical examination at a health care facility    Reviewed health habits including diet and exercise and skin cancer prevention Reviewed appropriate screening tests for age  Also reviewed health mt list, fam hx and immunization status , as well as social and family history   See HPI Reviewed AMW Wearing hearing aid today and mood is good  Labs reviewed  Declines dexa        Hyperlipidemia    Disc goals for lipids and reasons to control them Rev labs with pt Rev low sat fat diet in detail        Other Visit Diagnoses   None.

## 2016-01-04 NOTE — Assessment & Plan Note (Signed)
Declines dexa  Takes daily vitamin D No new falls  Hip fx in the past  Disc need for calcium/ vitamin D/ wt bearing exercise and bone density test every 2 y to monitor Disc safety/ fracture risk in detail

## 2016-01-04 NOTE — Assessment & Plan Note (Signed)
Reviewed health habits including diet and exercise and skin cancer prevention Reviewed appropriate screening tests for age  Also reviewed health mt list, fam hx and immunization status , as well as social and family history   See HPI Reviewed AMW Wearing hearing aid today and mood is good  Labs reviewed  Declines dexa

## 2016-01-04 NOTE — Patient Instructions (Signed)
I'm glad you are doing well  Labs are stable  Keep eating regularly  Stay active Keep thinking about quitting smoking

## 2016-01-31 ENCOUNTER — Other Ambulatory Visit: Payer: Self-pay | Admitting: Family Medicine

## 2016-02-12 ENCOUNTER — Telehealth: Payer: Self-pay | Admitting: Family Medicine

## 2016-02-12 DIAGNOSIS — L989 Disorder of the skin and subcutaneous tissue, unspecified: Secondary | ICD-10-CM | POA: Insufficient documentation

## 2016-02-12 NOTE — Telephone Encounter (Signed)
Pt's wife req derm ref for her husband while she was in the office Will route to [facility] Please call her to schedule Thanks

## 2016-02-12 NOTE — Telephone Encounter (Signed)
Appt scheduled, patient aware and [employer] Auth complete.

## 2016-07-28 ENCOUNTER — Telehealth: Payer: Self-pay

## 2016-07-28 MED ORDER — SERTRALINE HCL 100 MG PO TABS: ORAL_TABLET | ORAL | 1 refills | Status: DC

## 2016-07-28 NOTE — Addendum Note (Signed)
Addended by: Helene Shoe on: 07/28/2016 03:10 PM   Modules accepted: Orders

## 2016-07-28 NOTE — Telephone Encounter (Signed)
Mrs Lansing said pt has changed ins co and pharmacy to [facility]. Pt has not ran out of med and wants  To know what to do to get established with new Mail order. Advised Mrs Woolley to call home delivery and set up acct and then when pt needs refill to call [facility]. Mrs Giannotti voiced understanding.

## 2016-07-28 NOTE — Telephone Encounter (Signed)
Daniel Chapman said acct is set up and request refill sertraline to [facility]. Last annual 12/2015. Refilled per protocol. Daniel Chapman voiced understanding.

## 2016-09-20 ENCOUNTER — Encounter: Payer: Self-pay | Admitting: Internal Medicine

## 2016-09-20 ENCOUNTER — Ambulatory Visit (INDEPENDENT_AMBULATORY_CARE_PROVIDER_SITE_OTHER): Payer: Medicare HMO | Admitting: Internal Medicine

## 2016-09-20 ENCOUNTER — Encounter: Payer: Self-pay | Admitting: Family

## 2016-09-20 VITALS — BP 110/72 | HR 74 | Temp 97.5°F | Wt 130.5 lb

## 2016-09-20 DIAGNOSIS — R198 Other specified symptoms and signs involving the digestive system and abdomen: Secondary | ICD-10-CM

## 2016-09-20 NOTE — Patient Instructions (Signed)

## 2016-09-20 NOTE — Progress Notes (Addendum)
Subjective:    Patient ID: Daniel Chapman, male    DOB: Apr 30, 1932, 83 y.o.   MRN: [id number]  HPI  Pt presents to the clinic today with c/o difficulty swallowing. He reports this started 2 weeks ago. He is having trouble swallowing his medications. He reports he does not really have a whole lot of issues swallowing liquids or his food. He denies any recent change in his medications. He does have a history of vocal cord carcinoma s/p radiation. He denies heartburn or reflux. He is requesting a referral to Dr. Radene Journey.  Review of Systems  Past Medical History:  Diagnosis Date  . Carcinoma of vocal cord [facility]) 2008   radiation therapy  . Carotid artery occlusion   . COPD (chronic obstructive pulmonary disease) ([place])   . Fall due to ice or snow Feb. 22, 2015   Partial Right Hip replaced  . History of ITP   . Hx of colonic polyps   . Hyperlipemia   . Osteopenia    ?  Marland Kitchen Osteoporosis    dexa 06, declines further dexa or tx   . Smoker     Current Outpatient Prescriptions  Medication Sig Dispense Refill  . calcium carbonate (TUMS EX) 750 MG chewable tablet Chew 1 tablet by mouth daily. Reported on 09/15/2015    . cholecalciferol (VITAMIN D) 1000 UNITS tablet Take 1,000 Units by mouth daily. Reported on 09/15/2015    . sertraline (ZOLOFT) 100 MG tablet TAKE 1/2 TABLET EVERY DAY  FOR  DEPRESSION 45 tablet 1   No current facility-administered medications for this visit.     No Known Allergies  Family History  Problem Relation Age of Onset  . Uterine cancer Mother   . Heart disease Sister     some heart trouble    Social History   Social History  . Marital status: Married    Spouse name: N/A  . Number of children: 6  . Years of education: N/A   Occupational History  . Ice business Self-Employed   Social History Main Topics  . Smoking status: Current Every Day Smoker    Packs/day: 0.50    Types: Cigarettes  . Smokeless tobacco: Never Used  . Alcohol use No  .  Drug use: No  . Sexual activity: No   Other Topics Concern  . Not on file   Social History Narrative  . No narrative on file     Constitutional: Denies fever, malaise, fatigue, headache or abrupt weight changes.  Gastrointestinal: Pt reports difficulty swallowing. Denies abdominal pain, bloating, constipation, diarrhea or blood in the stool.   No other specific complaints in a complete review of systems (except as listed in HPI above).     Objective:   Physical Exam   BP 110/72   Pulse 74   Temp 97.5 F (36.4 C) (Oral)   Wt 130 lb 8 oz (59.2 kg)   SpO2 96%   BMI 20.75 kg/m  Wt Readings from Last 3 Encounters:  09/20/16 130 lb 8 oz (59.2 kg)  01/04/16 121 lb 8 oz (55.1 kg)  12/18/15 119 lb 4 oz (54.1 kg)    General: Appears his stated age, in NAD. HEENT: Throat/Mouth: Teeth missing, mucosa pink and moist, no exudate, lesions or ulcerations noted.  Abdomen: Soft and nontender. Normal bowel sounds. No distention or masses noted.   BMET    Component Value Date/Time   NA 138 12/18/2015 1109   K 4.1 12/18/2015 1109  CL 105 12/18/2015 1109   CO2 25 12/18/2015 1109   GLUCOSE 92 12/18/2015 1109   BUN 21 12/18/2015 1109   CREATININE 0.98 12/18/2015 1109   CALCIUM 9.5 12/18/2015 1109   GFRNONAA 90.53 05/12/2010 1022   GFRAA 142 02/08/2008 1239    Lipid Panel     Component Value Date/Time   CHOL 147 12/18/2015 1109   TRIG 115.0 12/18/2015 1109   HDL 30.50 (L) 12/18/2015 1109   CHOLHDL 5 12/18/2015 1109   VLDL 23.0 12/18/2015 1109   LDLCALC 93 12/18/2015 1109    CBC    Component Value Date/Time   WBC 8.9 12/18/2015 1109   RBC 4.30 12/18/2015 1109   HGB 12.6 (L) 12/18/2015 1109   HGB 13.6 09/16/2011 0942   HCT 39.0 12/18/2015 1109   HCT 41.1 09/16/2011 0942   PLT 203.0 12/18/2015 1109   PLT 167 09/16/2011 0942   MCV 90.9 12/18/2015 1109   MCV 95.1 09/16/2011 0942   MCH 31.5 09/16/2011 0942   MCHC 32.3 12/18/2015 1109   RDW 15.3 12/18/2015 1109   RDW  13.2 09/16/2011 0942   LYMPHSABS 2.8 12/18/2015 1109   LYMPHSABS 2.3 09/16/2011 0942   MONOABS 1.0 12/18/2015 1109   MONOABS 0.7 09/16/2011 0942   EOSABS 0.1 12/18/2015 1109   EOSABS 0.1 09/16/2011 0942   BASOSABS 0.1 12/18/2015 1109   BASOSABS 0.1 09/16/2011 0942    Hgb A1C No results found for: HGBA1C         Assessment & Plan:   Difficulty Swallowing Pills:  I tried to put him on Prilosec, he declines stating " I already can't swallow the pills I got" Referral placed to ENT for larygoscopy  RTC as needed or if symptoms persist or worsen BAITY, REGINA, NP

## 2016-09-20 NOTE — Addendum Note (Signed)
Addended by: Jearld Fenton on: 09/20/2016 03:56 PM   Modules accepted: Orders

## 2016-09-21 DIAGNOSIS — R1314 Dysphagia, pharyngoesophageal phase: Secondary | ICD-10-CM | POA: Diagnosis not present

## 2016-09-27 ENCOUNTER — Ambulatory Visit (INDEPENDENT_AMBULATORY_CARE_PROVIDER_SITE_OTHER): Payer: Medicare HMO | Admitting: Family

## 2016-09-27 ENCOUNTER — Encounter: Payer: Self-pay | Admitting: Family

## 2016-09-27 ENCOUNTER — Ambulatory Visit (HOSPITAL_COMMUNITY)
Admission: RE | Admit: 2016-09-27 | Discharge: 2016-09-27 | Disposition: A | Discharge status: Home / Self Care | Payer: Medicare HMO | Source: Ambulatory Visit | Attending: Family | Admitting: Family

## 2016-09-27 VITALS — BP 136/89 | HR 76 | Temp 96.5°F | Resp 16 | Ht 68.0 in | Wt 129.0 lb

## 2016-09-27 DIAGNOSIS — I6523 Occlusion and stenosis of bilateral carotid arteries: Secondary | ICD-10-CM | POA: Diagnosis not present

## 2016-09-27 DIAGNOSIS — I6521 Occlusion and stenosis of right carotid artery: Secondary | ICD-10-CM | POA: Diagnosis not present

## 2016-09-27 DIAGNOSIS — F17200 Nicotine dependence, unspecified, uncomplicated: Secondary | ICD-10-CM

## 2016-09-27 DIAGNOSIS — R69 Illness, unspecified: Secondary | ICD-10-CM | POA: Diagnosis not present

## 2016-09-27 NOTE — Patient Instructions (Addendum)
Stroke Prevention Some medical conditions and behaviors are associated with an increased chance of having a stroke. You may prevent a stroke by making healthy choices and managing medical conditions. How can I reduce my risk of having a stroke?  Stay physically active. Get at least 30 minutes of activity on most or all days.  Do not smoke. It may also be helpful to avoid exposure to secondhand smoke.  Limit alcohol use. Moderate alcohol use is considered to be:  No more than 2 drinks per day for men.  No more than 1 drink per day for nonpregnant women.  Eat healthy foods. This involves:  Eating 5 or more servings of fruits and vegetables a day.  Making dietary changes that address high blood pressure (hypertension), high cholesterol, diabetes, or obesity.  Manage your cholesterol levels.  Making food choices that are high in fiber and low in saturated fat, trans fat, and cholesterol may control cholesterol levels.  Take any prescribed medicines to control cholesterol as directed by your health care provider.  Manage your diabetes.  Controlling your carbohydrate and sugar intake is recommended to manage diabetes.  Take any prescribed medicines to control diabetes as directed by your health care provider.  Control your hypertension.  Making food choices that are low in salt (sodium), saturated fat, trans fat, and cholesterol is recommended to manage hypertension.  Ask your health care provider if you need treatment to lower your blood pressure. Take any prescribed medicines to control hypertension as directed by your health care provider.  If you are 18-39 years of age, have your blood pressure checked every 3-5 years. If you are 40 years of age or older, have your blood pressure checked every year.  Maintain a healthy weight.  Reducing calorie intake and making food choices that are low in sodium, saturated fat, trans fat, and cholesterol are recommended to manage  weight.  Stop drug abuse.  Avoid taking birth control pills.  Talk to your health care provider about the risks of taking birth control pills if you are over 35 years old, smoke, get migraines, or have ever had a blood clot.  Get evaluated for sleep disorders (sleep apnea).  Talk to your health care provider about getting a sleep evaluation if you snore a lot or have excessive sleepiness.  Take medicines only as directed by your health care provider.  For some people, aspirin or blood thinners (anticoagulants) are helpful in reducing the risk of forming abnormal blood clots that can lead to stroke. If you have the irregular heart rhythm of atrial fibrillation, you should be on a blood thinner unless there is a good reason you cannot take them.  Understand all your medicine instructions.  Make sure that other conditions (such as anemia or atherosclerosis) are addressed. Get help right away if:  You have sudden weakness or numbness of the face, arm, or leg, especially on one side of the body.  Your face or eyelid droops to one side.  You have sudden confusion.  You have trouble speaking (aphasia) or understanding.  You have sudden trouble seeing in one or both eyes.  You have sudden trouble walking.  You have dizziness.  You have a loss of balance or coordination.  You have a sudden, severe headache with no known cause.  You have new chest pain or an irregular heartbeat. Any of these symptoms may represent a serious problem that is an emergency. Do not wait to see if the symptoms will go away.   Get medical help at once. Call your local emergency services (911 in [place] Do not drive yourself to the hospital.  This information is not intended to replace advice given to you by your health care provider. Make sure you discuss any questions you have with your health care provider. Document Released: 06/23/2004 Document Revised: 10/22/2015 Document Reviewed: 11/16/2012 Elsevier  Interactive Patient Education  2017 Elsevier Inc.     Steps to Quit Smoking Smoking tobacco can be bad for your health. It can also affect almost every organ in your body. Smoking puts you and people around you at risk for many serious long-lasting (chronic) diseases. Quitting smoking is hard, but it is one of the best things that you can do for your health. It is never too late to quit. What are the benefits of quitting smoking? When you quit smoking, you lower your risk for getting serious diseases and conditions. They can include:  Lung cancer or lung disease.  Heart disease.  Stroke.  Heart attack.  Not being able to have children (infertility).  Weak bones (osteoporosis) and broken bones (fractures). If you have coughing, wheezing, and shortness of breath, those symptoms may get better when you quit. You may also get sick less often. If you are pregnant, quitting smoking can help to lower your chances of having a baby of low birth weight. What can I do to help me quit smoking? Talk with your doctor about what can help you quit smoking. Some things you can do (strategies) include:  Quitting smoking totally, instead of slowly cutting back how much you smoke over a period of time.  Going to in-person counseling. You are more likely to quit if you go to many counseling sessions.  Using resources and support systems, such as:  Online chats with a counselor.  Phone quitlines.  Printed self-help materials.  Support groups or group counseling.  Text messaging programs.  Mobile phone apps or applications.  Taking medicines. Some of these medicines may have nicotine in them. If you are pregnant or breastfeeding, do not take any medicines to quit smoking unless your doctor says it is okay. Talk with your doctor about counseling or other things that can help you. Talk with your doctor about using more than one strategy at the same time, such as taking medicines while you are  also going to in-person counseling. This can help make quitting easier. What things can I do to make it easier to quit? Quitting smoking might feel very hard at first, but there is a lot that you can do to make it easier. Take these steps:  Talk to your family and friends. Ask them to support and encourage you.  Call phone quitlines, reach out to support groups, or work with a counselor.  Ask people who smoke to not smoke around you.  Avoid places that make you want (trigger) to smoke, such as:  Bars.  Parties.  Smoke-break areas at work.  Spend time with people who do not smoke.  Lower the stress in your life. Stress can make you want to smoke. Try these things to help your stress:  Getting regular exercise.  Deep-breathing exercises.  Yoga.  Meditating.  Doing a body scan. To do this, close your eyes, focus on one area of your body at a time from head to toe, and notice which parts of your body are tense. Try to relax the muscles in those areas.  Download or buy apps on your mobile phone or tablet   that can help you stick to your quit plan. There are many free apps, such as QuitGuide from the CDC (Centers for Disease Control and Prevention). You can find more support from smokefree.gov and other websites. This information is not intended to replace advice given to you by your health care provider. Make sure you discuss any questions you have with your health care provider. Document Released: 03/12/2009 Document Revised: 01/12/2016 Document Reviewed: 09/30/2014 Elsevier Interactive Patient Education  2017 Elsevier Inc.  

## 2016-09-27 NOTE — Progress Notes (Signed)
Chief Complaint: Follow up Extracranial Carotid Artery Stenosis   History of Present Illness  DEDRICK Chapman is a 83 y.o. male who was referred to Dr. Donnetta Hutching, and who was found to have a carotid bruit and underwent a duplex for further evaluation of this. Dr. Donnetta Hutching reviewed his [place] from 08/10/2012 which was done at [facility] heart [facility]. 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 Patient has not had previous carotid artery intervention.  He is left-handed. He specifically denies any history of hemiparesis, amaurosis fugax, or any aphasia. He does have a long history of cigarette smoking.  He had vocal cord cancer with radiation tx in 2008.  He returns today for follow up. He had a strongly palpable aortic pulse at his visit on 09/03/2013; aortic Duplex later in April of 2015 revealed no aneurysm of the abdominal aorta. He denies any known personal or family history of aneurysms. He denies back or abdominal pain.  He fell and fractured his right hip on 07/21/2013, had a partial right hip replacement.   He denies claudication symptoms with walking.   Pt Diabetic: No  Pt smoker: smoking one cigarette/day  Pt meds include:  Statin : No: states his cholesterol is good  ASA: No: he has a history of ITP.  Other anticoagulants/antiplatelets: no    Past Medical History:  Diagnosis Date  . Carcinoma of vocal cord [facility]) 2008   radiation therapy  . Carotid artery occlusion   . COPD (chronic obstructive pulmonary disease) ([place])   . Fall due to ice or snow Feb. 22, 2015   Partial Right Hip replaced  . History of ITP   . Hx of colonic polyps   . Hyperlipemia   . Osteopenia    ?  Marland Kitchen Osteoporosis    dexa 06, declines further dexa or tx   . Smoker     Social History Social History  Substance Use Topics  . Smoking status: Current Every Day Smoker    Packs/day: 0.50    Types: Cigarettes  .  Smokeless tobacco: Never Used  . Alcohol use No    Family History Family History  Problem Relation Age of Onset  . Uterine cancer Mother   . Heart disease Sister     some heart trouble    Surgical History Past Surgical History:  Procedure Laterality Date  . BONE MARROW BIOPSY  05/2003  . CATARACT EXTRACTION    . Dexa     per patient ? osteopenia  . HEMORRHOID SURGERY    . JOINT REPLACEMENT Right Feb. 22, 2015   Partial Hip  . SPLENECTOMY    . TONSILLECTOMY AND ADENOIDECTOMY Bilateral age 50 per patient    No Known Allergies  Current Outpatient Prescriptions  Medication Sig Dispense Refill  . calcium carbonate (TUMS EX) 750 MG chewable tablet Chew 1 tablet by mouth daily. Reported on 09/15/2015    . cholecalciferol (VITAMIN D) 1000 UNITS tablet Take 1,000 Units by mouth daily. Reported on 09/15/2015    . sertraline (ZOLOFT) 100 MG tablet TAKE 1/2 TABLET EVERY DAY  FOR  DEPRESSION 45 tablet 1   No current facility-administered medications for this visit.     Review of Systems : See HPI for pertinent positives and negatives.  Physical Examination  Vitals:   09/27/16 1452 09/27/16 1454  BP: 136/78 136/89  Pulse: 77 76  Resp: 16   Temp: (!) 96.5 F (35.8 C)  TempSrc: Oral   SpO2: 98%   Weight: 129 lb (58.5 kg)   Height: '5\' 8"'  (1.727 m)    Body mass index is 19.61 kg/m.  General: WDWN thin, pleasant, animated male in NAD, few remaining teeth in advanced state of decay.   GAIT: normal   Eyes: PERRLA   Pulmonary: Respirations are non-labored, CTAB Cardiac: regular rhythm, no detected murmur.   VASCULAR EXAM  Carotid Bruits  Left  Right    Negative  positive   Aorta is palpable.  Radial pulses are 2+ palpable and equal.    LE Pulses  LEFT  RIGHT   FEMORAL  palpable  palpable   POPLITEAL  not palpable  not palpable   POSTERIOR TIBIAL  not palpable  not palpable   DORSALIS PEDIS  ANTERIOR TIBIAL   palpable  palpable    Gastrointestinal: soft, nontender, BS WNL, no r/g, no palpable masses.   Musculoskeletal: No muscle atrophy/wasting. M/S 5/5 throughout, Extremities without ischemic changes.   Neurologic: A&O X 3; Appropriate Affect, Speech is normal  CN 2-12 intact except has some hearing loss, Pain and light touch intact in extremities, Motor exam as listed above     Assessment: Daniel Chapman is a 83 y.o. male who has a known occlusion of his right internal carotid artery and minimal stenosis of the left internal carotid artery.  He is left-handed. He has no hx of stroke or TIA. He does have a long history of cigarette smoking, states he smokes 1 cigarette/day now.   DATA Today's carotid Duplex suggests a confirmed occlusion of right internal carotid artery, <40% stenosis of the left internal carotid artery, and significant stenosis in the left external carotid artery. No significant change compared to prior Duplex on 09-15-15.    Plan:  Follow-up in 1 year with Carotid Duplex scan       I discussed in depth with the patient the nature of atherosclerosis, and emphasized the importance of maximal medical management including strict control of blood pressure, blood glucose, and lipid levels, obtaining regular exercise, and cessation of smoking.  The patient is aware that without maximal medical management the underlying atherosclerotic disease process will progress, limiting the benefit of any interventions. The patient was given information about stroke prevention and what symptoms should prompt the patient to seek immediate medical care. Thank you for allowing [place] to participate in this patient's care.  Daniel Chambers, RN, MSN, FNP-C [facility] Office: [phone]  Clinic Physician: Early  09/27/16 3:00 PM

## 2016-09-29 NOTE — Addendum Note (Signed)
Addended by: Lianne Cure A on: 09/29/2016 09:14 AM   Modules accepted: Orders

## 2016-10-12 DIAGNOSIS — R0989 Other specified symptoms and signs involving the circulatory and respiratory systems: Secondary | ICD-10-CM | POA: Diagnosis not present

## 2016-10-12 DIAGNOSIS — Z0000 Encounter for general adult medical examination without abnormal findings: Secondary | ICD-10-CM | POA: Diagnosis not present

## 2016-10-12 DIAGNOSIS — Z974 Presence of external hearing-aid: Secondary | ICD-10-CM | POA: Diagnosis not present

## 2016-10-12 DIAGNOSIS — Z79899 Other long term (current) drug therapy: Secondary | ICD-10-CM | POA: Diagnosis not present

## 2016-10-12 DIAGNOSIS — R69 Illness, unspecified: Secondary | ICD-10-CM | POA: Diagnosis not present

## 2016-10-12 DIAGNOSIS — K649 Unspecified hemorrhoids: Secondary | ICD-10-CM | POA: Diagnosis not present

## 2016-10-12 DIAGNOSIS — H9113 Presbycusis, bilateral: Secondary | ICD-10-CM | POA: Diagnosis not present

## 2016-10-12 DIAGNOSIS — R233 Spontaneous ecchymoses: Secondary | ICD-10-CM | POA: Diagnosis not present

## 2016-10-12 DIAGNOSIS — M151 Heberden's nodes (with arthropathy): Secondary | ICD-10-CM | POA: Diagnosis not present

## 2016-10-12 DIAGNOSIS — R0600 Dyspnea, unspecified: Secondary | ICD-10-CM | POA: Diagnosis not present

## 2016-10-12 DIAGNOSIS — K08409 Partial loss of teeth, unspecified cause, unspecified class: Secondary | ICD-10-CM | POA: Diagnosis not present

## 2016-12-22 NOTE — Progress Notes (Signed)
PCP notes:   Health maintenance: None.   Abnormal screenings: None.   Patient concerns:  Pt states after any meal he starts feeling lethargic. States it also happens with liquids. "Any time I get full I get sleepy."   Nurse concerns: None.   Next PCP appt: 01/03/17.  I reviewed health advisor's note, was available for consultation, and agree with documentation and plan. Loura Pardon MD

## 2016-12-22 NOTE — Progress Notes (Signed)
Subjective:   Daniel Chapman is a 83 y.o. male who presents for Medicare Annual/Subsequent preventive examination.  Review of Systems:  No ROS.  Medicare Wellness Visit. Additional risk factors are reflected in the social history.  Cardiac Risk Factors include: advanced age (>57mn, >>22women);male gender;smoking/ tobacco exposure;dyslipidemia     Objective:    Vitals: BP 130/78 (BP Location: Left Arm, Patient Position: Sitting, Cuff Size: Normal)   Pulse 75   Resp 16   Ht 5' 6.93" (1.7 m)   Wt 126 lb 12.8 oz (57.5 kg)   SpO2 96%   BMI 19.90 kg/m   Body mass index is 19.9 kg/m.  Tobacco History  Smoking Status  . Current Every Day Smoker  . Packs/day: 0.50  . Types: Cigarettes  Smokeless Tobacco  . Never Used     Ready to quit: Not Answered Counseling given: Not Answered   Past Medical History:  Diagnosis Date  . Carcinoma of vocal cord ([facility] 2008   radiation therapy  . Carotid artery occlusion   . [place]   . Fall due to ice or snow Feb. 22, 2015   Partial Right Hip replaced  . History of ITP   . Hx of colonic polyps   . Hyperlipemia   . Osteopenia    ?  .Marland KitchenOsteoporosis    dexa 06, declines further dexa or tx   . Smoker    Past Surgical History:  Procedure Laterality Date  . BONE MARROW BIOPSY  05/2003  . CATARACT EXTRACTION    . Dexa     per patient ? osteopenia  . HEMORRHOID SURGERY    . JOINT REPLACEMENT Right Feb. 22, 2015   Partial Hip  . SPLENECTOMY    . TONSILLECTOMY AND ADENOIDECTOMY Bilateral age [age]per patient   Family History  Problem Relation Age of Onset  . Uterine cancer Mother   . Heart disease Sister        some heart trouble   History  Sexual Activity  . Sexual activity: No    Outpatient Encounter Prescriptions as of 12/27/2016  Medication Sig  . calcium carbonate (TUMS EX) 750 MG chewable tablet Chew 1 tablet by mouth daily. Reported on 09/15/2015  . cholecalciferol (VITAMIN D)  1000 UNITS tablet Take 1,000 Units by mouth daily. Reported on 09/15/2015  . sertraline (ZOLOFT) 100 MG tablet TAKE 1/2 TABLET EVERY DAY  FOR  DEPRESSION   No facility-administered encounter medications on file as of 12/27/2016.     Activities of Daily Living In your present state of health, do you have any difficulty performing the following activities: 12/27/2016  Hearing? N  Vision? N  Difficulty concentrating or making decisions? N  Walking or climbing stairs? N  Dressing or bathing? N  Doing errands, shopping? N  Preparing Food and eating ? N  Using the Toilet? N  In the past six months, have you accidently leaked urine? N  Do you have problems with loss of bowel control? N  Managing your Medications? N  Managing your Finances? N  Housekeeping or managing your Housekeeping? N  Some recent data might be hidden    Patient Care Team: Tower, MWynelle Fanny MD as PCP - General Nickel, SSharmon Leyden NP as Nurse Practitioner (Vascular Surgery)   Assessment:    Physical assessment deferred to PCP.  Exercise Activities and Dietary recommendations Current Exercise Habits: The patient has a physically strenous job, but has no regular exercise apart  from work.;The patient does not participate in regular exercise at present, Exercise limited by: None identified  Goals    . keep working          Starting 12/18/2015, I will continue to work 1-2 hours daily as long as possible.  12/27/2016, I have met this goal and keep this as my goal.      Fall Risk Fall Risk  12/27/2016 12/18/2015 12/17/2014 08/02/2012  Falls in the past year? No No No Yes  Number falls in past yr: - - - 1   Depression Screen PHQ 2/9 Scores 12/27/2016 12/18/2015 12/17/2014 08/02/2012  PHQ - 2 Score 0 0 0 0    Cognitive Function PLEASE NOTE: A Mini-Cog screen was completed. Maximum score is 20. A value of 0 denotes this part of Folstein MMSE was not completed or the patient failed this part of the Mini-Cog screening.   Mini-Cog  Screening Orientation to Time - Max 5 pts Orientation to Place - Max 5 pts Registration - Max 3 pts Recall - Max 3 pts Language Repeat - Max 1 pts Language Follow 3 Step Command - Max 3 pts      Mini-Cog - 12/27/16 0918    Normal clock drawing test? yes   How many words correct? 2      MMSE - Mini Mental State Exam 12/27/2016 12/18/2015  Orientation to time 4 5  Orientation to Place 5 5  Registration 3 3  Attention/ Calculation 0 0  Recall 2 3  Language- name 2 objects 0 0  Language- repeat 1 1  Language- follow 3 step command 3 3  Language- read & follow direction 0 0  Write a sentence 0 0  Copy design 0 0  Total score 18 20        Immunization History  Administered Date(s) Administered  . Influenza Split 04/06/2011, 03/07/2012  . Influenza Whole 11/26/2004, 04/06/2007, 03/27/2008, 05/08/2009, 03/09/2010  . Influenza,inj,Quad PF,36+ Mos 03/14/2013, 03/19/2014, 04/21/2015  . Influenza-Unspecified 05/20/2016  . PPD Test 07/24/2013  . Pneumococcal Conjugate-13 12/17/2014  . Pneumococcal Polysaccharide-23 04/30/2003, 04/06/2007  . Td 09/28/2002  . Zoster 02/28/2014   Screening Tests Health Maintenance  Topic Date Due  . INFLUENZA VACCINE  12/28/2016  . TETANUS/TDAP  04/07/2022  . PNA vac Low Risk Adult  Completed      Plan:   Follow up with PCP as directed.  I have personally reviewed and noted the following in the patient's chart:   . Medical and social history . Use of alcohol, tobacco or illicit drugs  . Current medications and supplements . Functional ability and status . Nutritional status . Physical activity . Advanced directives . List of other physicians . Vitals . Screenings to include cognitive, depression, and falls . Referrals and appointments  In addition, I have reviewed and discussed with patient certain preventive protocols, quality metrics, and best practice recommendations. A written personalized care plan for preventive services as  well as general preventive health recommendations were provided to patient.     Ree Edman, RN  12/27/2016

## 2016-12-25 ENCOUNTER — Telehealth: Payer: Self-pay | Admitting: Family Medicine

## 2016-12-25 DIAGNOSIS — Z0000 Encounter for general adult medical examination without abnormal findings: Secondary | ICD-10-CM

## 2016-12-25 DIAGNOSIS — Z125 Encounter for screening for malignant neoplasm of prostate: Secondary | ICD-10-CM

## 2016-12-25 NOTE — Telephone Encounter (Signed)
-----   Message from Ellamae Sia sent at 12/14/2016  4:25 PM EDT ----- Regarding: Lab orders for Tuesday, 7.31.18  AWV lab orders, please.

## 2016-12-27 ENCOUNTER — Ambulatory Visit (INDEPENDENT_AMBULATORY_CARE_PROVIDER_SITE_OTHER): Payer: Medicare HMO

## 2016-12-27 ENCOUNTER — Other Ambulatory Visit (INDEPENDENT_AMBULATORY_CARE_PROVIDER_SITE_OTHER): Payer: Medicare HMO

## 2016-12-27 VITALS — BP 130/78 | HR 75 | Resp 16 | Ht 66.93 in | Wt 126.8 lb

## 2016-12-27 DIAGNOSIS — Z0000 Encounter for general adult medical examination without abnormal findings: Secondary | ICD-10-CM | POA: Diagnosis not present

## 2016-12-27 LAB — CBC WITH DIFFERENTIAL/PLATELET
BASOS ABS: 0.1 10*3/uL (ref 0.0–0.1)
Basophils Relative: 1.3 % (ref 0.0–3.0)
EOS ABS: 0.2 10*3/uL (ref 0.0–0.7)
Eosinophils Relative: 2.8 % (ref 0.0–5.0)
HEMATOCRIT: 39.4 % (ref 39.0–52.0)
HEMOGLOBIN: 12.7 g/dL — AB (ref 13.0–17.0)
LYMPHS PCT: 35.8 % (ref 12.0–46.0)
Lymphs Abs: 2.6 10*3/uL (ref 0.7–4.0)
MCHC: 32.3 g/dL (ref 30.0–36.0)
MCV: 93.5 fl (ref 78.0–100.0)
Monocytes Absolute: 0.8 10*3/uL (ref 0.1–1.0)
Monocytes Relative: 11.4 % (ref 3.0–12.0)
NEUTROS ABS: 3.5 10*3/uL (ref 1.4–7.7)
Neutrophils Relative %: 48.7 % (ref 43.0–77.0)
PLATELETS: 221 10*3/uL (ref 150.0–400.0)
RBC: 4.22 Mil/uL (ref 4.22–5.81)
RDW: 15.4 % (ref 11.5–15.5)
WBC: 7.2 10*3/uL (ref 4.0–10.5)

## 2016-12-27 LAB — LIPID PANEL
CHOL/HDL RATIO: 5
Cholesterol: 133 mg/dL (ref 0–200)
HDL: 26.6 mg/dL — ABNORMAL LOW (ref >39.00)
LDL Cholesterol: 77 mg/dL (ref 0–99)
NONHDL: 106.08
TRIGLYCERIDES: 146 mg/dL (ref 0.0–149.0)
VLDL: 29.2 mg/dL (ref 0.0–40.0)

## 2016-12-27 LAB — COMPREHENSIVE METABOLIC PANEL
ALT: 11 U/L (ref 0–53)
AST: 17 U/L (ref 0–37)
Albumin: 3.7 g/dL (ref 3.5–5.2)
Alkaline Phosphatase: 49 U/L (ref 39–117)
BILIRUBIN TOTAL: 0.5 mg/dL (ref 0.2–1.2)
BUN: 15 mg/dL (ref 6–23)
CALCIUM: 9.2 mg/dL (ref 8.4–10.5)
CO2: 27 meq/L (ref 19–32)
CREATININE: 0.84 mg/dL (ref 0.40–1.50)
Chloride: 103 mEq/L (ref 96–112)
GFR: 92.69 mL/min (ref >60.00)
Glucose, Bld: 93 mg/dL (ref 70–99)
Potassium: 4.4 mEq/L (ref 3.5–5.1)
SODIUM: 136 meq/L (ref 135–145)
Total Protein: 7.1 g/dL (ref 6.0–8.3)

## 2016-12-27 LAB — TSH: TSH: 1.11 u[IU]/mL (ref 0.35–4.50)

## 2016-12-27 NOTE — Patient Instructions (Addendum)
[name] ,  Bring a copy of your advance directives to your next office visit.  Thank you for taking time to come for your Medicare Wellness Visit. I appreciate your ongoing commitment to your health goals. Please review the following plan we discussed and let me know if I can assist you in the future.   These are the goals we discussed: Goals    . keep working          Starting 12/18/2015, I will continue to work 1-2 hours daily as long as possible.        This is a list of the screening recommended for you and due dates:  Health Maintenance  Topic Date Due  . Flu Shot  12/28/2016  . Tetanus Vaccine  04/07/2022  . Pneumonia vaccines  Completed   Preventive Care for Adults  A healthy lifestyle and preventive care can promote health and wellness. Preventive health guidelines for adults include the following key practices.  . A routine yearly physical is a good way to check with your health care provider about your health and preventive screening. It is a chance to share any concerns and updates on your health and to receive a thorough exam.  . Visit your dentist for a routine exam and preventive care every 6 months. Brush your teeth twice a day and floss once a day. Good oral hygiene prevents tooth decay and gum disease.  . The frequency of eye exams is based on your age, health, family medical history, use  of contact lenses, and other factors. Follow your health care provider's ecommendations for frequency of eye exams.  . Eat a healthy diet. Foods like vegetables, fruits, whole grains, low-fat dairy products, and lean protein foods contain the nutrients you need without too many calories. Decrease your intake of foods high in solid fats, added sugars, and salt. Eat the right amount of calories for you. Get information about a proper diet from your health care provider, if necessary.  . Regular physical exercise is one of the most important things you can do for your health. Most  adults should get at least 150 minutes of moderate-intensity exercise (any activity that increases your heart rate and causes you to sweat) each week. In addition, most adults need muscle-strengthening exercises on 2 or more days a week.  Silver Sneakers may be a benefit available to you. To determine eligibility, you may visit the website: www.silversneakers.com or contact program at (770) 081-0447 Mon-Fri between 8AM-8PM.   . Maintain a healthy weight. The body mass index (BMI) is a screening tool to identify possible weight problems. It provides an estimate of body fat based on height and weight. Your health care provider can find your BMI and can help you achieve or maintain a healthy weight.   For adults 20 years and older: ? A BMI below 18.5 is considered underweight. ? A BMI of 18.5 to 24.9 is normal. ? A BMI of 25 to 29.9 is considered overweight. ? A BMI of 30 and above is considered obese.   . Maintain normal blood lipids and cholesterol levels by exercising and minimizing your intake of saturated fat. Eat a balanced diet with plenty of fruit and vegetables. Blood tests for lipids and cholesterol should begin at age 88 and be repeated every 5 years. If your lipid or cholesterol levels are high, you are over 50, or you are at high risk for heart disease, you may need your cholesterol levels checked more frequently. Ongoing  high lipid and cholesterol levels should be treated with medicines if diet and exercise are not working.  . If you smoke, find out from your health care provider how to quit. If you do not use tobacco, please do not start.  . If you choose to drink alcohol, please do not consume more than 2 drinks per day. One drink is considered to be 12 ounces (355 mL) of beer, 5 ounces (148 mL) of wine, or 1.5 ounces (44 mL) of liquor.  . If you are 55-72 years old, ask your health care provider if you should take aspirin to prevent strokes.  . Use sunscreen. Apply sunscreen  liberally and repeatedly throughout the day. You should seek shade when your shadow is shorter than you. Protect yourself by wearing long sleeves, pants, a wide-brimmed hat, and sunglasses year round, whenever you are outdoors.  . Once a month, do a whole body skin exam, using a mirror to look at the skin on your back. Tell your health care provider of new moles, moles that have irregular borders, moles that are larger than a pencil eraser, or moles that have changed in shape or color.

## 2017-01-03 ENCOUNTER — Encounter: Payer: Self-pay | Admitting: Family Medicine

## 2017-01-03 ENCOUNTER — Telehealth: Payer: Self-pay | Admitting: Family Medicine

## 2017-01-03 ENCOUNTER — Ambulatory Visit (INDEPENDENT_AMBULATORY_CARE_PROVIDER_SITE_OTHER): Payer: Medicare HMO | Admitting: Family Medicine

## 2017-01-03 VITALS — BP 118/64 | HR 96 | Temp 97.3°F | Ht 66.75 in | Wt 126.2 lb

## 2017-01-03 DIAGNOSIS — L723 Sebaceous cyst: Secondary | ICD-10-CM | POA: Diagnosis not present

## 2017-01-03 DIAGNOSIS — E7800 Pure hypercholesterolemia, unspecified: Secondary | ICD-10-CM | POA: Diagnosis not present

## 2017-01-03 DIAGNOSIS — Z0000 Encounter for general adult medical examination without abnormal findings: Secondary | ICD-10-CM | POA: Diagnosis not present

## 2017-01-03 DIAGNOSIS — F17200 Nicotine dependence, unspecified, uncomplicated: Secondary | ICD-10-CM | POA: Diagnosis not present

## 2017-01-03 DIAGNOSIS — J449 Chronic obstructive pulmonary disease, unspecified: Secondary | ICD-10-CM | POA: Diagnosis not present

## 2017-01-03 DIAGNOSIS — R69 Illness, unspecified: Secondary | ICD-10-CM | POA: Diagnosis not present

## 2017-01-03 DIAGNOSIS — M8580 Other specified disorders of bone density and structure, unspecified site: Secondary | ICD-10-CM | POA: Diagnosis not present

## 2017-01-03 DIAGNOSIS — Z8781 Personal history of (healed) traumatic fracture: Secondary | ICD-10-CM | POA: Diagnosis not present

## 2017-01-03 MED ORDER — SERTRALINE HCL 100 MG PO TABS: ORAL_TABLET | ORAL | 3 refills | Status: DC

## 2017-01-03 NOTE — Assessment & Plan Note (Signed)
Growing and more bothersome (2 cm) Ref to derm to consider removal  Not infected

## 2017-01-03 NOTE — Assessment & Plan Note (Signed)
Disc in detail risks of smoking and possible outcomes including copd, vascular/ heart disease, cancer , respiratory and sinus infections  Pt voices understanding Pt states he has tried to quit but can't  Has survived vocal cord cancer  Has difficult time gaining weight as well  No c/o of breathing problems

## 2017-01-03 NOTE — Assessment & Plan Note (Signed)
Declines further dexa Declines tx  Hx of hip fx in 2015 No falls or fractures since  Enc to continue ca and D  Fall precautions disc  Enc exercise  Enc smoking cessation

## 2017-01-03 NOTE — Progress Notes (Signed)
Subjective:    Patient ID: Daniel Chapman, male    DOB: [birth year], 83 y.o.   MRN: [id number]  HPI  Here for health maintenance exam and to review chronic medical problems    Doing ok overall  Still working   Has a growth on his neck-itches a bit  Have followed it in the past   Wt Readings from Last 3 Encounters:  01/03/17 126 lb 4 oz (57.3 kg)  12/27/16 126 lb 12.8 oz (57.5 kg)  09/27/16 129 lb (58.5 kg)  pt thinks he eats enough  Always had trouble trying to gain weight - has always been very slim  Does not miss meals  Appetite remains good  Hard to swallow bread w/o liquid (after cancer treatment)  19.92 kg/m   Drinks a lot of coffee and tea  Also keeps moving all the time   Gets sleepy after he eats  He will take a nap  Gets up very early    Had amw 7/31 Mini cog 18/20  (recall got 2 of 3) Sometimes walks into a room and forgets why  No major changes  Worse with stressors    dexa 2/06 osteopenia  He declines further evaluation or any treatment  Hip fx in 2015 No falls or new fractures    Colonoscopy 1/10-polyp with neg bx results  He is interested in ifob  He has struggled with hemorrhoids in the past (no bleeding)  Continues to smoke-about 1/2 ppd  Breathing - no changes 02 sat 96% Hx of copd -does not feel like he needs an inhaler   Hx of past vocal cord carcinoma  Everything is stable / voice is the same also   Takes sertraline for depression  Only feels down once in a while - when things go badly / but for the most part doing well and stable    Hx of AAA and Carotid stenosis that are followed by vascular F/u is next may   Hyperlipidemia Lab Results  Component Value Date   CHOL 133 12/27/2016   CHOL 147 12/18/2015   CHOL 132 12/10/2014   Lab Results  Component Value Date   HDL 26.60 (L) 12/27/2016   HDL 30.50 (L) 12/18/2015   HDL 31.10 (L) 12/10/2014   Lab Results  Component Value Date   LDLCALC 77 12/27/2016   LDLCALC 93  12/18/2015   LDLCALC 83 12/10/2014   Lab Results  Component Value Date   TRIG 146.0 12/27/2016   TRIG 115.0 12/18/2015   TRIG 91.0 12/10/2014   Lab Results  Component Value Date   CHOLHDL 5 12/27/2016   CHOLHDL 5 12/18/2015   CHOLHDL 4 12/10/2014   No results found for: LDLDIRECT Diet controlled Fairly stable  He eats whatever he wants  Chronic low HDL    Prostate health  Out aged screening No trouble urinating  0-1 nocturia if he drinks a lot of coffee or tea  Lab Results  Component Value Date   PSA 0.36 12/10/2014   PSA 0.48 07/11/2011   PSA 0.41 05/12/2010     Hx of past low platelets  Lab Results  Component Value Date   WBC 7.2 12/27/2016   HGB 12.7 (L) 12/27/2016   HCT 39.4 12/27/2016   MCV 93.5 12/27/2016   PLT 221.0 12/27/2016   Nl since last year  Stable Hb     Chemistry      Component Value Date/Time   NA 136 12/27/2016 0944   K 4.4  12/27/2016 0944   CL 103 12/27/2016 0944   CO2 27 12/27/2016 0944   BUN 15 12/27/2016 0944   CREATININE 0.84 12/27/2016 0944      Component Value Date/Time   CALCIUM 9.2 12/27/2016 0944   ALKPHOS 49 12/27/2016 0944   AST 17 12/27/2016 0944   ALT 11 12/27/2016 0944   BILITOT 0.5 12/27/2016 0944      BP Readings from Last 3 Encounters:  01/03/17 118/64  12/27/16 130/78  09/27/16 136/89   Glucose is 93   Lab Results  Component Value Date   TSH 1.11 12/27/2016     Review of Systems     Objective:   Physical Exam  Constitutional: He appears well-developed and well-nourished. No distress.  Well appearing   HENT:  Head: Normocephalic and atraumatic.  Right Ear: External ear normal.  Left Ear: External ear normal.  Nose: Nose normal.  Mouth/Throat: Oropharynx is clear and moist.  Baseline hoarse voice   Eyes: Pupils are equal, round, and reactive to light. Conjunctivae and EOM are normal. Right eye exhibits no discharge. Left eye exhibits no discharge. No scleral icterus.  Neck: Normal range of  motion. Neck supple. No JVD present. Carotid bruit is present. No thyromegaly present.  Cardiovascular: Normal rate, regular rhythm, normal heart sounds and intact distal pulses.  Exam reveals no gallop.   Pulmonary/Chest: Effort normal and breath sounds normal. No respiratory distress. He has no wheezes. He exhibits no tenderness.  Diffusely distant bs   Abdominal: Soft. Bowel sounds are normal. He exhibits no distension, no abdominal bruit and no mass. There is no tenderness.  Musculoskeletal: He exhibits no edema or tenderness.  Lymphadenopathy:    He has no cervical adenopathy.  Neurological: He is alert. He has normal reflexes. No cranial nerve deficit. He exhibits normal muscle tone. Coordination normal.  Skin: Skin is warm and dry. No rash noted. No erythema. No pallor.  Sebaceous cyst on R upper back/neck has grown  No redness or drainage   Psychiatric: He has a normal mood and affect.  Baseline affect - loud and aggressive personality           Assessment & Plan:   Problem List Items Addressed This Visit      Respiratory   COPD (chronic obstructive pulmonary disease) ([place])    Pt continues to smoke 1/2 ppd  No plans to quit  Denies breathing symptoms or problems Does not currently use an inhaler  Will continue to follow Enc strongly to try to quit smoking        Musculoskeletal and Integument   Osteopenia    Declines further dexa Declines tx  Hx of hip fx in 2015 No falls or fractures since  Enc to continue ca and D  Fall precautions disc  Enc exercise  Enc smoking cessation       Sebaceous cyst    Growing and more bothersome (2 cm) Ref to derm to consider removal  Not infected        Relevant Orders   Ambulatory referral to Dermatology     Other   History of hip fracture   Hyperlipidemia    Disc goals for lipids and reasons to control them Rev labs with pt Rev low sat fat diet in detail HDL is still low-enc exercise/fish oil LDL is very well  controlled       Routine general medical examination at a health care facility - Primary    Reviewed health habits including diet  and exercise and skin cancer prevention Reviewed appropriate screening tests for age  Also reviewed health mt list, fam hx and immunization status , as well as social and family history   Rev amw  Rev labs and chronic medical problems Enc smoking cessation  ifob kit given  Depression is well controlled most of the time Ref to derm for seb cyst that is more bothersome  He declines dexa or tx for low bone mass  Rev mini cog-will watch status of short term memory        TOBACCO ABUSE    Disc in detail risks of smoking and possible outcomes including copd, vascular/ heart disease, cancer , respiratory and sinus infections  Pt voices understanding Pt states he has tried to quit but can't  Has survived vocal cord cancer  Has difficult time gaining weight as well  No c/o of breathing problems

## 2017-01-03 NOTE — Telephone Encounter (Signed)
Derm referral: Pt would like [facility], prefers [facility] cb number is [phone] Thanks

## 2017-01-03 NOTE — Assessment & Plan Note (Signed)
Reviewed health habits including diet and exercise and skin cancer prevention Reviewed appropriate screening tests for age  Also reviewed health mt list, fam hx and immunization status , as well as social and family history   Rev amw  Rev labs and chronic medical problems Enc smoking cessation  ifob kit given  Depression is well controlled most of the time Ref to derm for seb cyst that is more bothersome  He declines dexa or tx for low bone mass  Rev mini cog-will watch status of short term memory

## 2017-01-03 NOTE — Assessment & Plan Note (Signed)
Pt continues to smoke 1/2 ppd  No plans to quit  Denies breathing symptoms or problems Does not currently use an inhaler  Will continue to follow Enc strongly to try to quit smoking

## 2017-01-03 NOTE — Patient Instructions (Addendum)
Keep eating regularly - get protein with every meal   Take naps when you need them   Do the stool cards for screening   Try to drink more water and less coffee and tea   Keep thinking about quitting smoking

## 2017-01-03 NOTE — Assessment & Plan Note (Signed)
Disc goals for lipids and reasons to control them Rev labs with pt Rev low sat fat diet in detail HDL is still low-enc exercise/fish oil LDL is very well controlled

## 2017-01-05 NOTE — Telephone Encounter (Signed)
appintment 02/08/17

## 2017-02-06 ENCOUNTER — Inpatient Hospital Stay (HOSPITAL_COMMUNITY): Payer: Medicare HMO

## 2017-02-06 ENCOUNTER — Telehealth: Payer: Self-pay | Admitting: Family Medicine

## 2017-02-06 ENCOUNTER — Encounter (HOSPITAL_COMMUNITY): Payer: Self-pay

## 2017-02-06 ENCOUNTER — Inpatient Hospital Stay (HOSPITAL_COMMUNITY)
Admission: EM | Admit: 2017-02-06 | Discharge: 2017-02-10 | DRG: 066 | Disposition: A | Discharge status: Skilled Nursing Facility | Payer: Medicare HMO | Attending: Internal Medicine | Admitting: Internal Medicine

## 2017-02-06 ENCOUNTER — Emergency Department (HOSPITAL_COMMUNITY): Payer: Medicare HMO

## 2017-02-06 DIAGNOSIS — R278 Other lack of coordination: Secondary | ICD-10-CM | POA: Diagnosis not present

## 2017-02-06 DIAGNOSIS — I6521 Occlusion and stenosis of right carotid artery: Secondary | ICD-10-CM | POA: Diagnosis not present

## 2017-02-06 DIAGNOSIS — I6502 Occlusion and stenosis of left vertebral artery: Secondary | ICD-10-CM | POA: Diagnosis present

## 2017-02-06 DIAGNOSIS — Z9081 Acquired absence of spleen: Secondary | ICD-10-CM | POA: Diagnosis not present

## 2017-02-06 DIAGNOSIS — R1312 Dysphagia, oropharyngeal phase: Secondary | ICD-10-CM | POA: Diagnosis present

## 2017-02-06 DIAGNOSIS — I340 Nonrheumatic mitral (valve) insufficiency: Secondary | ICD-10-CM | POA: Diagnosis present

## 2017-02-06 DIAGNOSIS — J449 Chronic obstructive pulmonary disease, unspecified: Secondary | ICD-10-CM | POA: Diagnosis not present

## 2017-02-06 DIAGNOSIS — I69398 Other sequelae of cerebral infarction: Secondary | ICD-10-CM | POA: Diagnosis not present

## 2017-02-06 DIAGNOSIS — Z96641 Presence of right artificial hip joint: Secondary | ICD-10-CM | POA: Diagnosis present

## 2017-02-06 DIAGNOSIS — I4891 Unspecified atrial fibrillation: Secondary | ICD-10-CM | POA: Diagnosis not present

## 2017-02-06 DIAGNOSIS — I6529 Occlusion and stenosis of unspecified carotid artery: Secondary | ICD-10-CM | POA: Diagnosis present

## 2017-02-06 DIAGNOSIS — Z86010 Personal history of colonic polyps: Secondary | ICD-10-CM

## 2017-02-06 DIAGNOSIS — Z8249 Family history of ischemic heart disease and other diseases of the circulatory system: Secondary | ICD-10-CM | POA: Diagnosis not present

## 2017-02-06 DIAGNOSIS — I63111 Cerebral infarction due to embolism of right vertebral artery: Secondary | ICD-10-CM | POA: Diagnosis not present

## 2017-02-06 DIAGNOSIS — F17200 Nicotine dependence, unspecified, uncomplicated: Secondary | ICD-10-CM | POA: Diagnosis not present

## 2017-02-06 DIAGNOSIS — Z923 Personal history of irradiation: Secondary | ICD-10-CM

## 2017-02-06 DIAGNOSIS — Z9889 Other specified postprocedural states: Secondary | ICD-10-CM

## 2017-02-06 DIAGNOSIS — I6523 Occlusion and stenosis of bilateral carotid arteries: Secondary | ICD-10-CM

## 2017-02-06 DIAGNOSIS — R42 Dizziness and giddiness: Secondary | ICD-10-CM | POA: Diagnosis not present

## 2017-02-06 DIAGNOSIS — R918 Other nonspecific abnormal finding of lung field: Secondary | ICD-10-CM | POA: Diagnosis not present

## 2017-02-06 DIAGNOSIS — J439 Emphysema, unspecified: Secondary | ICD-10-CM | POA: Diagnosis present

## 2017-02-06 DIAGNOSIS — Z8521 Personal history of malignant neoplasm of larynx: Secondary | ICD-10-CM | POA: Diagnosis not present

## 2017-02-06 DIAGNOSIS — F17210 Nicotine dependence, cigarettes, uncomplicated: Secondary | ICD-10-CM | POA: Diagnosis present

## 2017-02-06 DIAGNOSIS — F419 Anxiety disorder, unspecified: Secondary | ICD-10-CM | POA: Diagnosis present

## 2017-02-06 DIAGNOSIS — E785 Hyperlipidemia, unspecified: Secondary | ICD-10-CM | POA: Diagnosis present

## 2017-02-06 DIAGNOSIS — I714 Abdominal aortic aneurysm, without rupture, unspecified: Secondary | ICD-10-CM | POA: Diagnosis present

## 2017-02-06 DIAGNOSIS — J40 Bronchitis, not specified as acute or chronic: Secondary | ICD-10-CM | POA: Diagnosis not present

## 2017-02-06 DIAGNOSIS — I639 Cerebral infarction, unspecified: Secondary | ICD-10-CM

## 2017-02-06 DIAGNOSIS — C320 Malignant neoplasm of glottis: Secondary | ICD-10-CM | POA: Diagnosis not present

## 2017-02-06 DIAGNOSIS — I63431 Cerebral infarction due to embolism of right posterior cerebral artery: Principal | ICD-10-CM | POA: Diagnosis present

## 2017-02-06 DIAGNOSIS — Z9849 Cataract extraction status, unspecified eye: Secondary | ICD-10-CM | POA: Diagnosis not present

## 2017-02-06 DIAGNOSIS — Z79899 Other long term (current) drug therapy: Secondary | ICD-10-CM

## 2017-02-06 DIAGNOSIS — Z8049 Family history of malignant neoplasm of other genital organs: Secondary | ICD-10-CM

## 2017-02-06 DIAGNOSIS — M810 Age-related osteoporosis without current pathological fracture: Secondary | ICD-10-CM | POA: Diagnosis present

## 2017-02-06 DIAGNOSIS — Z8673 Personal history of transient ischemic attack (TIA), and cerebral infarction without residual deficits: Secondary | ICD-10-CM | POA: Diagnosis present

## 2017-02-06 DIAGNOSIS — E782 Mixed hyperlipidemia: Secondary | ICD-10-CM

## 2017-02-06 DIAGNOSIS — R69 Illness, unspecified: Secondary | ICD-10-CM | POA: Diagnosis not present

## 2017-02-06 DIAGNOSIS — R41841 Cognitive communication deficit: Secondary | ICD-10-CM | POA: Diagnosis not present

## 2017-02-06 DIAGNOSIS — I6309 Cerebral infarction due to thrombosis of other precerebral artery: Secondary | ICD-10-CM | POA: Diagnosis not present

## 2017-02-06 DIAGNOSIS — R2681 Unsteadiness on feet: Secondary | ICD-10-CM | POA: Diagnosis not present

## 2017-02-06 DIAGNOSIS — R269 Unspecified abnormalities of gait and mobility: Secondary | ICD-10-CM | POA: Diagnosis not present

## 2017-02-06 LAB — COMPREHENSIVE METABOLIC PANEL
ALT: 17 U/L (ref 17–63)
ANION GAP: 9 (ref 5–15)
AST: 29 U/L (ref 15–41)
Albumin: 3.7 g/dL (ref 3.5–5.0)
Alkaline Phosphatase: 52 U/L (ref 38–126)
BUN: 14 mg/dL (ref 6–20)
CALCIUM: 9.3 mg/dL (ref 8.9–10.3)
CHLORIDE: 104 mmol/L (ref 101–111)
CO2: 24 mmol/L (ref 22–32)
Creatinine, Ser: 0.89 mg/dL (ref 0.61–1.24)
GFR calc non Af Amer: >60 mL/min (ref >60)
Glucose, Bld: 129 mg/dL — ABNORMAL HIGH (ref 65–99)
POTASSIUM: 4.1 mmol/L (ref 3.5–5.1)
SODIUM: 137 mmol/L (ref 135–145)
Total Bilirubin: 1 mg/dL (ref 0.3–1.2)
Total Protein: 7.4 g/dL (ref 6.5–8.1)

## 2017-02-06 LAB — ECHOCARDIOGRAM COMPLETE
Height: 67.5 in
Weight: 1984 oz

## 2017-02-06 LAB — I-STAT CHEM 8, ED
BUN: 18 mg/dL (ref 6–20)
Calcium, Ion: 1.17 mmol/L (ref 1.15–1.40)
Chloride: 103 mmol/L (ref 101–111)
Creatinine, Ser: 0.8 mg/dL (ref 0.61–1.24)
GLUCOSE: 128 mg/dL — AB (ref 65–99)
HEMATOCRIT: 41 % (ref 39.0–52.0)
HEMOGLOBIN: 13.9 g/dL (ref 13.0–17.0)
POTASSIUM: 4.1 mmol/L (ref 3.5–5.1)
Sodium: 138 mmol/L (ref 135–145)
TCO2: 24 mmol/L (ref 22–32)

## 2017-02-06 LAB — I-STAT TROPONIN, ED: Troponin i, poc: 0 ng/mL (ref 0.00–0.08)

## 2017-02-06 LAB — DIFFERENTIAL
BASOS PCT: 0 %
Basophils Absolute: 0 10*3/uL (ref 0.0–0.1)
EOS ABS: 0 10*3/uL (ref 0.0–0.7)
EOS PCT: 0 %
Lymphocytes Relative: 17 %
Lymphs Abs: 1.7 10*3/uL (ref 0.7–4.0)
MONO ABS: 1 10*3/uL (ref 0.1–1.0)
Monocytes Relative: 10 %
Neutro Abs: 7.2 10*3/uL (ref 1.7–7.7)
Neutrophils Relative %: 73 %

## 2017-02-06 LAB — CBC
HCT: 40.4 % (ref 39.0–52.0)
Hemoglobin: 13 g/dL (ref 13.0–17.0)
MCH: 29.1 pg (ref 26.0–34.0)
MCHC: 32.2 g/dL (ref 30.0–36.0)
MCV: 90.4 fL (ref 78.0–100.0)
PLATELETS: 224 10*3/uL (ref 150–400)
RBC: 4.47 MIL/uL (ref 4.22–5.81)
RDW: 15.4 % (ref 11.5–15.5)
WBC: 9.9 10*3/uL (ref 4.0–10.5)

## 2017-02-06 LAB — TSH: TSH: 0.722 u[IU]/mL (ref 0.350–4.500)

## 2017-02-06 LAB — CBG MONITORING, ED: GLUCOSE-CAPILLARY: 123 mg/dL — AB (ref 65–99)

## 2017-02-06 LAB — PROTIME-INR
INR: 1.03
PROTHROMBIN TIME: 13.4 s (ref 11.4–15.2)

## 2017-02-06 LAB — LIPID PANEL
Cholesterol: 148 mg/dL (ref 0–200)
HDL: 29 mg/dL — AB (ref >40)
LDL CALC: 96 mg/dL (ref 0–99)
TRIGLYCERIDES: 116 mg/dL (ref <150)
Total CHOL/HDL Ratio: 5.1 RATIO
VLDL: 23 mg/dL (ref 0–40)

## 2017-02-06 LAB — APTT: aPTT: 35 seconds (ref 24–36)

## 2017-02-06 MED ORDER — ACETAMINOPHEN 325 MG PO TABS: 650.0000 mg | ORAL_TABLET | ORAL | Status: DC | PRN

## 2017-02-06 MED ORDER — STROKE: EARLY STAGES OF RECOVERY BOOK
Freq: Once | Status: AC
  Administered 2017-02-07: 23:00:00
  Filled 2017-02-06: qty 1

## 2017-02-06 MED ORDER — SODIUM CHLORIDE 0.9 % IV SOLN
INTRAVENOUS | Status: AC
  Administered 2017-02-06: 17:00:00 via INTRAVENOUS

## 2017-02-06 MED ORDER — ROSUVASTATIN CALCIUM 5 MG PO TABS
10.0000 mg | ORAL_TABLET | Freq: Every day | ORAL | Status: DC
  Administered 2017-02-07 – 2017-02-09 (×2): 10 mg via ORAL
  Filled 2017-02-06 (×4): qty 2

## 2017-02-06 MED ORDER — SENNOSIDES-DOCUSATE SODIUM 8.6-50 MG PO TABS: 1.0000 | ORAL_TABLET | Freq: Every evening | ORAL | Status: DC | PRN

## 2017-02-06 MED ORDER — IOPAMIDOL (ISOVUE-370) INJECTION 76%
INTRAVENOUS | Status: AC
  Administered 2017-02-06: 50 mL
  Filled 2017-02-06: qty 50

## 2017-02-06 MED ORDER — ENOXAPARIN SODIUM 40 MG/0.4ML ~~LOC~~ SOLN
40.0000 mg | SUBCUTANEOUS | Status: DC
  Administered 2017-02-06 – 2017-02-09 (×4): 40 mg via SUBCUTANEOUS
  Filled 2017-02-06 (×4): qty 0.4

## 2017-02-06 MED ORDER — ACETAMINOPHEN 160 MG/5ML PO SOLN: 650.0000 mg | ORAL | Status: DC | PRN

## 2017-02-06 MED ORDER — ACETAMINOPHEN 650 MG RE SUPP: 650.0000 mg | RECTAL | Status: DC | PRN

## 2017-02-06 NOTE — Progress Notes (Signed)
  Echocardiogram 2D Echocardiogram has been performed.  Daniel Chapman 02/06/2017, 2:47 PM

## 2017-02-06 NOTE — Progress Notes (Signed)
Pt received at this time from ED with wife at bedside. Pt stable, neuro intact with no noted distress. Telemetry monitoring. Pt oriented to room. Safety measures in place. Call bell within reach. Will continue to monitor.

## 2017-02-06 NOTE — Consult Note (Signed)
Referring Physician: Dr. Marily Memos    Chief Complaint: Incoordination  HPI: Daniel Chapman is an 83 y.o. male who presented to the ED today for evaluation of new onset dizziness and incoordination. Symptoms also included difficulty speaking, headache, nausea and vomiting. The patient was knocking things onto the floor as a result of the incoordination. Per ED triage note: "His wife who came home yesterday afternoon and found the pt laying on the floor beside vomit and a coffee pot he knocked down.  Initially pt was unable to speak, but eventually he was able to state that he was hit with "a bout of dizziness"." He has vomited three times since Sunday.   CT head in the ED revealed a large right inferior cerebellar hemisphere ischemic infarction. Increased density overlapping the vertebrobasilar junction was felt by Radiology to possibly represent thrombosis and a CTA was recommended.   The patient denies a prior history of stroke. 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 He states that the low platelets resolved after the splenectomy and that it was diagnosed approximately 7 years ago. He was told at the time of the ITP diagnosis not to use ASA; however, he and his wife are not clear regarding whether ASA was still contraindicated following the splenectomy and resolution of the thrombocytopenia.   His PMHx includes carotid artery occlusion, HLD, vocal cord cancer, tobacco use and COPD.   EKG in the ED reveals atrial fibrillation. He has no prior history of such.    Past Medical History:  Diagnosis Date  . Carcinoma of vocal cord [facility]) 2008   radiation therapy  . Carotid artery occlusion   . COPD (chronic obstructive pulmonary disease) ([place])   . Fall due to ice or snow Feb. 22, 2015   Partial Right Hip replaced  . History of ITP   . Hx of colonic polyps   . Hyperlipemia   . Osteopenia    ?  [facility] Kitchen Osteoporosis    dexa 06, declines further dexa or tx   . Smoker     Past Surgical  History:  Procedure Laterality Date  . BONE MARROW BIOPSY  05/2003  . CATARACT EXTRACTION    . Dexa     per patient ? osteopenia  . HEMORRHOID SURGERY    . JOINT REPLACEMENT Right Feb. 22, 2015   Partial Hip  . SPLENECTOMY    . TONSILLECTOMY AND ADENOIDECTOMY Bilateral age 5 per patient    Family History  Problem Relation Age of Onset  . Uterine cancer Mother   . Heart disease Sister        some heart trouble   Social History:  reports that he has been smoking Cigarettes.  He has been smoking about 0.50 packs per day. He has never used smokeless tobacco. He reports that he does not drink alcohol or use drugs.  Allergies: No Known Allergies  Medications: Prior to Admission:  Calcium carbonate Vitamin D Sertraline  ROS: No chest pain, edema, fever, chills, dysuria, vision changes, confusion or limb numbness.   Physical Examination: Blood pressure 133/85, pulse 67, temperature 97.7 F (36.5 C), temperature source Oral, resp. rate 17, height 5' 7.5" (1.715 m), weight 56.2 kg (124 lb), SpO2 100 %.  General: NAD Abdomen: Nondistended Ext: No edema, bruising or petechiae  Neurologic Examination: Mental Status: Alert and oriented. Some inappropriate joking. Speech fluent. Has difficulty naming examiner's digits and a straw. Able to follow all commands. . Cranial Nerves: II:  Visual fields intact.  PERRL. III,IV, VI: Ptosis not present, extra-ocular movements intact bilaterally, with saccadic visual pursuits noted. No nystagmus  V,VII: Smile symmetric, facial temp sensation normal bilaterally VIII: hearing intact to conversation IX,X: No hypophonia or hoarseness XI: No asymmetry XII: midline tongue extension  Motor: Bilateral upper and lower extremities 5/5 proximal and distal Sensory: Mildly decreased temp sensation to RUE. FT intact without extinction.   Deep Tendon Reflexes:  2+ bilateral upper and lower extremities.  Plantars: Right: downgoing   Left:  downgoing Cerebellar: Dyssinergia and dysmetria with left FNF. Normal FNF on the right. Ataxia with heel-shin bilaterally.  Gait: Deferred.    Results for orders placed or performed during the hospital encounter of 02/06/17 (from the past 48 hour(s))  Protime-INR     Status: None   Collection Time: 02/06/17  9:47 AM  Result Value Ref Range   Prothrombin Time 13.4 11.4 - 15.2 seconds   INR 1.03   APTT     Status: None   Collection Time: 02/06/17  9:47 AM  Result Value Ref Range   aPTT 35 24 - 36 seconds  CBC     Status: None   Collection Time: 02/06/17  9:47 AM  Result Value Ref Range   WBC 9.9 4.0 - 10.5 K/uL   RBC 4.47 4.22 - 5.81 MIL/uL   Hemoglobin 13.0 13.0 - 17.0 g/dL   HCT 40.4 39.0 - 52.0 %   MCV 90.4 78.0 - 100.0 fL   MCH 29.1 26.0 - 34.0 pg   MCHC 32.2 30.0 - 36.0 g/dL   RDW 15.4 11.5 - 15.5 %   Platelets 224 150 - 400 K/uL  Differential     Status: None   Collection Time: 02/06/17  9:47 AM  Result Value Ref Range   Neutrophils Relative % 73 %   Neutro Abs 7.2 1.7 - 7.7 K/uL   Lymphocytes Relative 17 %   Lymphs Abs 1.7 0.7 - 4.0 K/uL   Monocytes Relative 10 %   Monocytes Absolute 1.0 0.1 - 1.0 K/uL   Eosinophils Relative 0 %   Eosinophils Absolute 0.0 0.0 - 0.7 K/uL   Basophils Relative 0 %   Basophils Absolute 0.0 0.0 - 0.1 K/uL  Comprehensive metabolic panel     Status: Abnormal   Collection Time: 02/06/17  9:47 AM  Result Value Ref Range   Sodium 137 135 - 145 mmol/L   Potassium 4.1 3.5 - 5.1 mmol/L   Chloride 104 101 - 111 mmol/L   CO2 24 22 - 32 mmol/L   Glucose, Bld 129 (H) 65 - 99 mg/dL   BUN 14 6 - 20 mg/dL   Creatinine, Ser 0.89 0.61 - 1.24 mg/dL   Calcium 9.3 8.9 - 10.3 mg/dL   Total Protein 7.4 6.5 - 8.1 g/dL   Albumin 3.7 3.5 - 5.0 g/dL   AST 29 15 - 41 U/L   ALT 17 17 - 63 U/L   Alkaline Phosphatase 52 38 - 126 U/L   Total Bilirubin 1.0 0.3 - 1.2 mg/dL   GFR calc non Af Amer >60 >60 mL/min   GFR calc Af Amer >60 >60 mL/min    Comment:  (NOTE) The eGFR has been calculated using the CKD EPI equation. This calculation has not been validated in all clinical situations. eGFR's persistently <60 mL/min signify possible Chronic Kidney Disease.    Anion gap 9 5 - 15  Lipid panel     Status: Abnormal   Collection Time: 02/06/17  9:47 AM  Result Value Ref Range   Cholesterol 148 0 - 200 mg/dL   Triglycerides 116 <150 mg/dL   HDL 29 (L) >40 mg/dL   Total CHOL/HDL Ratio 5.1 RATIO   VLDL 23 0 - 40 mg/dL   LDL Cholesterol 96 0 - 99 mg/dL    Comment:        Total Cholesterol/HDL:CHD Risk Coronary Heart Disease Risk Table                     Men   Women  1/2 Average Risk   3.4   3.3  Average Risk       5.0   4.4  2 X Average Risk   9.6   7.1  3 X Average Risk  23.4   11.0        Use the calculated Patient Ratio above and the CHD Risk Table to determine the patient's CHD Risk.        ATP III CLASSIFICATION (LDL):  <100     mg/dL   Optimal  100-129  mg/dL   Near or Above                    Optimal  130-159  mg/dL   Borderline  160-189  mg/dL   High  >190     mg/dL   Very High   CBG monitoring, ED     Status: Abnormal   Collection Time: 02/06/17 10:04 AM  Result Value Ref Range   Glucose-Capillary 123 (H) 65 - 99 mg/dL  I-stat troponin, ED     Status: None   Collection Time: 02/06/17 10:07 AM  Result Value Ref Range   Troponin i, poc 0.00 0.00 - 0.08 ng/mL   Comment 3            Comment: Due to the release kinetics of cTnI, a negative result within the first hours of the onset of symptoms does not rule out myocardial infarction with certainty. If myocardial infarction is still suspected, repeat the test at appropriate intervals.   I-Stat Chem 8, ED     Status: Abnormal   Collection Time: 02/06/17 10:09 AM  Result Value Ref Range   Sodium 138 135 - 145 mmol/L   Potassium 4.1 3.5 - 5.1 mmol/L   Chloride 103 101 - 111 mmol/L   BUN 18 6 - 20 mg/dL   Creatinine, Ser 0.80 0.61 - 1.24 mg/dL   Glucose, Bld 128 (H)  65 - 99 mg/dL   Calcium, Ion 1.17 1.15 - 1.40 mmol/L   TCO2 24 22 - 32 mmol/L   Hemoglobin 13.9 13.0 - 17.0 g/dL   HCT 41.0 39.0 - 52.0 %   Dg Chest 2 View  Result Date: 02/06/2017 CLINICAL DATA:  Stroke.  No chest complaints. EXAM: CHEST  2 VIEW COMPARISON:  07/19/2011 FINDINGS: Hyperinflation. Lateral view degraded by patient arm position. Midthoracic compression deformity is mild to moderate and similar. Numerous leads and wires project over the chest. Midline trachea. Normal heart size and mediastinal contours for age. Left costophrenic angle excluded from the frontal radiograph. No pleural effusion or pneumothorax. Biapical pleural-parenchymal scarring. Diffuse peribronchial thickening. IMPRESSION: 1.  No acute cardiopulmonary disease. 2. COPD/chronic bronchitis. Pleural-parenchymal scarring at the apices. Electronically Signed   By: Abigail Miyamoto M.D.   On: 02/06/2017 12:42   Ct Head Wo Contrast  Result Date: 02/06/2017 CLINICAL DATA:  83 year old male with a history of dizziness EXAM: CT HEAD WITHOUT CONTRAST TECHNIQUE: Contiguous  axial images were obtained from the base of the skull through the vertex without intravenous contrast. COMPARISON:  None. FINDINGS: Brain: Confluent hypodense territory involving the inferior and medial right cerebellar hemisphere. No acute intracranial hemorrhage. Minimal mass effect resulting in slight narrowing of the fourth ventricle. Unremarkable configuration of the ventricles. No supratentorial hemorrhage, midline shift, mass effect. Gray-white differentiation of the anterior circulation maintained. Vascular: Calcifications of intracranial vasculature. Sagittal reformatted images demonstrate questionable hyperdensity of the vertebrobasilar junction/inferior basilar artery. Skull: No acute bony abnormality.  No aggressive bone lesion. Sinuses/Orbits: No significant atherosclerotic changes. Other: None IMPRESSION: Subacute and completed infarction of the right  posterior inferior cerebellar territory. No intracranial hemorrhage, with minimal local mass effect secondary to edema. Sagittal reformatted images demonstrate questionable hyperdensity of the vertebrobasilar junction and the inferior basilar artery, potentially representing thrombus. CT angiogram may be useful. Electronically Signed   By: Corrie Mckusick D.O.   On: 02/06/2017 11:07    Assessment: 83 y.o. male with subacute and completed infarction of the right posterior inferior cerebellar territory. 1. CT head also reveals minimal local mass effect from the infarction secondary to edema. There is questionable hyperdensity of the vertebrobasilar junction and the inferior basilar artery, potentially representing thrombus. 2. New onset atrial fibrillation. 3. History of right ICA occlusion. Prior ultrasound report documents 40-59% left ICA stenosis secondary to atherosclerotic plaque.  4. History of ITP, s/p splenectomy. Platelet count today  normal.  5. Stroke Risk Factors - Carotid artery occlusion, HLD, history of cancer, tobacco use  Plan: 1. CTA of head and neck (ordered)  2. MRI of the brain without contrast 3. Echocardiogram 4. Smoking cessation 5. Telemetry monitoring 6. Frequent neuro checks 7. Repeat CT head in 24 hours. If significant mass effect develops, will need to start hypertonic saline and obtain an emergent Neurosurgery consult. Time window for brainstem compression or herniation due to swelling from cerebellar strokes is about 7 days, with a peak risk at about 4-5 days following stroke.  8. Also at risk for hemorrhagic conversion of the relatively large area of infarction.  9. Platelets are normal and has had a splenectomy for his ITP, therefore benefits of starting ASA for stroke prevention in the context of atrial fibrillation are felt to outweigh risks of hemorrhagic conversion of the cerebellar stroke.  10. HgbA1c, fasting lipid panel 11. PT consult, OT consult, Speech  consult 12. BP management. Out of permissive HTN time window.  68. Excerpt from the literature on anticoagulation for a-fib in patients with ITP: "Data on the use of anticoagulation in ITP are lacking; thus, treatment decisions should be based on the best available evidence, typically from studies in nonthrombocytopenic patients and patient values...stroke prevention would likely take priority at the expense of bleeding, especially since the risk of thrombosis (including stroke) may be particularly high in patients with ITP, and the bleeding risk can be mitigated by improving the thrombocytopenia. For patients with normal platelet counts and a CHADS2 score of 3, the annual risk of stroke is 5.9 percent. In the context of ITP and ITP treatments, that annual risk might be approximately twofold higher. With full-dose anticoagulation (e.g., apixaban) in non-ITP patients, the annual risk of stroke is reduced to 1.3 percent; the risk reduction is likely to be similar or greater in patients with ITP. The risk of major bleeding with apixaban is approximately 2.1 percent for nonthrombocytopenic patients. Add to that the risk of bleeding attributable to thrombocytopenia (approximately 10%), and the risk of major bleeding for  this patient might be closer to 15 percent"    _0  signed: Dr. Kerney Elbe 02/06/2017, 1:57 PM

## 2017-02-06 NOTE — Telephone Encounter (Signed)
Patient Name: Daniel Chapman DOB: [birth year] Initial Comment Pt's skin was cold and clammy- He was feeling dizzy and knocked things onto the floor. He was sitting in the chair when wife came home and would not speak; headache and vomiting Nurse Assessment Nurse: Vallery Sa, RN, [facility] [place] Date/Time (Eastern Time): 02/06/2017 8:29:44 AM Confirm and document reason for call. If symptomatic, describe symptoms. ---Caller states [name] is not having severe breathing difficulty. Alert and responsive to her questions at this time. No numbness. He developed a headache and dizziness yesterday (pain rated as a 4 on the 1 to 10 scale). No neck stiffness. No fever. No known injury. Does the patient have any new or worsening symptoms? ---Yes Will a triage be completed? ---Yes Related visit to physician within the last 2 weeks? ---Yes Does the PT have any chronic conditions? (i.e. diabetes, asthma, etc.) ---Yes List chronic conditions. ---COPD (no breathing problems or blueness around his lips) Is this a behavioral health or substance abuse call? ---No Guidelines Guideline Title Affirmed Question Affirmed Notes Headache Unable to walk, or can only walk with assistance (e.g., requires support) Final Disposition User Go to ED Now Vallery Sa, RN, [facility] - ED Disagree/Comply: Comply

## 2017-02-06 NOTE — Progress Notes (Signed)
PT Cancellation Note  Patient Details Name: Daniel Chapman MRN: [id number] DOB: 08/13/1931   Cancelled Treatment:    Reason Eval/Treat Not Completed: Patient at procedure or test/unavailable Pt currently out of room at procedure. Will follow up as schedule allows.   Leighton Ruff, PT, DPT  Acute Rehabilitation Services  Pager: [phone]    Rudean Hitt 02/06/2017, 5:59 PM

## 2017-02-06 NOTE — Consult Note (Addendum)
Cardiology Consultation:   Patient ID: Daniel Chapman; [id number]; 10/01/1931   Admit date: 02/06/2017 Date of Consult: 02/06/2017  Primary Care Provider: Abner Greenspan, MD Primary Cardiologist: new patient Primary Electrophysiologist:  n/a   Patient Profile:   Daniel Chapman is a 83 y.o. male with a hx of an acute stroke who is being seen today for the evaluation of new dg of atrial fibrillation at the request of Dr Marily Memos.  History of Present Illness:   [name] is an 83 y.o. male with h/o HLD, vocal cord cancer, tobacco use and COPD who presented to the ED today for evaluation of new onset dizziness and incoordination. Symptoms also included difficulty speaking, headache, nausea and vomiting. He was found by his wife on the floor beside vomit and a coffee pot he knocked down. Initially pt was unable to speak, but eventually he was able to state that he was hit with "a bout of dizziness. He has vomited three times since Sunday.  CT head in the ED revealed a large right inferior cerebellar hemisphere ischemic infarction. Increased density overlapping the vertebrobasilar junction was felt by Radiology to possibly represent thrombosis and a CTA was recommended. CTA showed a complete occlusion of the right internal carotid artery at its origin and a web or focal dissection of the left subclavian artery just proximal to the left vertebral artery origin. Severe stenosis of the left vertebral artery origin, 80% or greater.  The patient denies a prior history of stroke. 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  EKG in the ED reveals atrial fibrillation. He has no prior history of such. He also denies prior h/o MI, CHF. He is currently feeling well, no slurred speech, no CP or SOB, he has never felt palpitations and has no h/o syncope.  Past Medical History:  Diagnosis Date  . 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 Carotid artery occlusion   . COPD (chronic  obstructive pulmonary disease) ([place])   . Fall due to ice or snow Feb. 22, 2015   Partial Right Hip replaced  . History of ITP   . Hx of colonic polyps   . Hyperlipemia   . Osteopenia    ?  Marland Kitchen Osteoporosis    dexa 06, declines further dexa or tx   . Smoker     Past Surgical History:  Procedure Laterality Date  . BONE MARROW BIOPSY  05/2003  . CATARACT EXTRACTION    . Dexa     per patient ? osteopenia  . HEMORRHOID SURGERY    . JOINT REPLACEMENT Right Feb. 22, 2015   Partial Hip  . SPLENECTOMY    . TONSILLECTOMY AND ADENOIDECTOMY Bilateral age 48 per patient    Inpatient Medications: Scheduled Meds: .  stroke: mapping our early stages of recovery book   Does not apply Once  . enoxaparin (LOVENOX) injection  40 mg Subcutaneous Q24H   Continuous Infusions: . sodium chloride 50 mL/hr at 02/06/17 1646   PRN Meds: acetaminophen **OR** acetaminophen (TYLENOL) oral liquid 160 mg/5 mL **OR** acetaminophen, senna-docusate  Allergies:   No Known Allergies  Social History:   Social History   Social History  . Marital status: Married    Spouse name: N/A  . Number of children: 6  . Years of education: N/A   Occupational History  . Ice business Self-Employed   Social History Main Topics  . Smoking status: Current Every Day Smoker  Packs/day: 0.50    Types: Cigarettes  . Smokeless tobacco: Never Used  . Alcohol use No  . Drug use: No  . Sexual activity: No   Other Topics Concern  . Not on file   Social History Narrative  . No narrative on file    Family History:    Family History  Problem Relation Age of Onset  . Uterine cancer Mother   . Heart disease Sister        some heart trouble    ROS:  Please see the history of present illness.  ROS  All other ROS reviewed and negative.     Physical Exam/Data:   Vitals:   02/06/17 1245 02/06/17 1315 02/06/17 1345 02/06/17 1531  BP: 130/81 133/85 133/79 109/86  Pulse:   68 65  Resp: (!) 21 17 (!) 21 18    Temp:    (!) 97.5 F (36.4 C)  TempSrc:    Oral  SpO2:   100% 100%  Weight:    124 lb 11.2 oz (56.6 kg)  Height:    '5\' 7"'$  (1.702 m)   No intake or output data in the 24 hours ending 02/06/17 1655 Filed Weights   02/06/17 0940 02/06/17 1531  Weight: 124 lb (56.2 kg) 124 lb 11.2 oz (56.6 kg)   Body mass index is 19.53 kg/m.  General:  Well nourished, well developed, in no acute distress HEENT: normal Lymph: no adenopathy Neck: no JVD Endocrine:  No thryomegaly Vascular: No carotid bruits; FA pulses 2+ bilaterally without bruits  Cardiac:  normal S1, S2; iRRR; 2/6 systolic murmur  Lungs:  clear to auscultation bilaterally, no wheezing, rhonchi or rales  Abd: soft, nontender, no hepatomegaly  Ext: no edema Musculoskeletal:  No deformities, BUE and BLE strength normal and equal Skin: warm and dry  Neuro:  CNs 2-12 intact, no focal abnormalities noted Psych:  Normal affect   EKG:  The EKG was personally reviewed and demonstrates:  A-fib, poor R wave progression in the anterior leads Telemetry:  Telemetry was personally reviewed and demonstrates:  A-fib rate controlled  Relevant CV Studies:  Laboratory Data:  Chemistry  Recent Labs Lab 02/06/17 0947 02/06/17 1009  NA 137 138  K 4.1 4.1  CL 104 103  CO2 24  --   GLUCOSE 129* 128*  BUN 14 18  CREATININE 0.89 0.80  CALCIUM 9.3  --   GFRNONAA >60  --   GFRAA >60  --   ANIONGAP 9  --      Recent Labs Lab 02/06/17 0947  PROT 7.4  ALBUMIN 3.7  AST 29  ALT 17  ALKPHOS 52  BILITOT 1.0   Hematology  Recent Labs Lab 02/06/17 0947 02/06/17 1009  WBC 9.9  --   RBC 4.47  --   HGB 13.0 13.9  HCT 40.4 41.0  MCV 90.4  --   MCH 29.1  --   MCHC 32.2  --   RDW 15.4  --   PLT 224  --    Cardiac EnzymesNo results for input(s): TROPONINI in the last 168 hours.   Recent Labs Lab 02/06/17 1007  TROPIPOC 0.00    Radiology/Studies:   Ct Angio Neck W Or Wo Contrast  Result Date: 02/06/2017 CLINICAL DATA:   Acute right cerebellar infarction.  IMPRESSION: Advanced atherosclerotic disease of the thoracic aorta but without evidence of aneurysm or dissection. Atherosclerotic disease at the brachiocephalic vessel origins but without stenosis greater than 20%. 40% stenosis in the midportion  of the right common carotid artery. Complete occlusion of the right internal carotid artery at its origin. Atherosclerotic disease at the left carotid bifurcation. Detail is limited by artifact and motion. Stenosis is estimated at 50% at the proximal ICA. Right vertebral artery widely patent through the neck. Web or focal dissection of the left subclavian artery just proximal to the left vertebral artery origin. Severe stenosis of the left vertebral artery origin, 80% or greater. The vessel is widely patent beyond that. Both posterior inferior cerebellar arteries are patent presently. No visible anterior inferior cerebellar artery on the right. This could be occluded or congenitally absent. Right ICA reconstitution in the supraclinoid region secondary to external to internal collaterals, patent anterior communicating artery and patent posterior communicating artery. Parenchymal distributions of the anterior and middle cerebral vessels appear intact. Advanced chronic lung disease with advanced emphysema. Pleural and parenchymal scarring at both apices, with increased density compared to the CT chest of 2011. I cannot rule out the possibility of tumor in this region, though this may merely be progressive scarring. If the patient is a candidate for treatment pulmonary disease, complete chest CT would be warranted. Electronically Signed   By: Coleby Yett Chimes M.D.   On: 02/06/2017 14:33    Assessment and Plan:   1. New onset atrial fibrillation - CHA2DS2/VAS Stroke Risk Points  89 (age, stroke). He was just admitted with a large stroke with possible hemorraghic conversion, anticoagulation should be initiated once ok with neurology. He is rate  controlled, I would not attempt cardioversion in the settings of an acute stroke, we can plan once 4 weeks on anticoagulation. His echo shows normal LVEF, mild MR and  Normal lefta trial size. 2. Stroke - followed by neurology 3. BP and lipids are at goal. However in the settings of an acute stroke with known plaque I would recommend to start statin such as rosuvastatin 10 mg po daily.    For questions or updates, please contact [place] Please consult [email or web address] for contact info under Cardiology/STEMI. Daytime calls, contact the Day Call APP (6a-8a) or assigned team (Teams A-D) provider (7:30a - 5p). All other daytime calls (7:30-5p), contact the Card Master @ [phone].   Nighttime calls, contact the assigned APP (5p-8p) or MD (6:30p-8p). Overnight calls (8p-6a), contact the on call Fellow @ [phone].   Signed, Ena Dawley, MD  02/06/2017 4:55 PM

## 2017-02-06 NOTE — H&P (Signed)
History and Physical    [name] JKD:[id number] DOB: 1932/01/24 DOA: 02/06/2017  PCP: Abner Greenspan, MD Patient coming from: home  Chief Complaint: dizziness  HPI: Daniel Chapman is a 82 y.o. male with medical history significant for COPD not on home oxygen, tobacco use, vocal cord cancer, ITP, hyperlipidemia, carotid artery occlusion presents to emergency Department chief complaint dizziness fall yesterday. Initial evaluation reveals afib new onset and includes a CT of the head revealing Subacute and completed infarction of the right posterior inferior cerebellar territory. No intracranial hemorrhage, with minimal local mass effect secondary to edema.    Information is obtained from the patient and his wife is at the bedside. Patient is unable to articulate factly what happened yesterday. He can say that he became dizzy and fell down. He then got himself up to the chair. He does not remember vomiting but wife says when she came home from the grocery store she found him sitting in the chair with emesis on his shirt. In addition she found the house" disarray". Patient denies any headache visual disturbances numbness tingling of extremities. He denies chest pain palpitations shortness of breath. He denies nausea and does not remember vomiting. He denies diaphoresis lower extremity edema. He denies any fever chills recent travel or sick contacts. He denies dysuria hematuria frequency or urgency. He denies diarrhea constipation melena bright red blood per rectum. Wife states he appeared to have difficulty talking initially but that improved as the evening wore on. He slept through the night without any problems. She called the PCP this morning to report what happened and they recommend she come to the emergency department    ED Course: In emergency department he's afebrile hemodynamically stable and not hypoxic  Review of Systems: As per HPI otherwise all other systems reviewed and are negative.     Ambulatory Status: Ambulates independently and is independent with ADLs continues to work part-time  Past Medical History:  Diagnosis Date  . 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 Carotid artery occlusion   . COPD (chronic obstructive pulmonary disease) ([place])   . Fall due to ice or snow Feb. 22, 2015   Partial Right Hip replaced  . History of ITP   . Hx of colonic polyps   . Hyperlipemia   . Osteopenia    ?  Marland Kitchen Osteoporosis    dexa 06, declines further dexa or tx   . Smoker     Past Surgical History:  Procedure Laterality Date  . BONE MARROW BIOPSY  05/2003  . CATARACT EXTRACTION    . Dexa     per patient ? osteopenia  . HEMORRHOID SURGERY    . JOINT REPLACEMENT Right Feb. 22, 2015   Partial Hip  . SPLENECTOMY    . TONSILLECTOMY AND ADENOIDECTOMY Bilateral age 89 per patient    Social History   Social History  . Marital status: Married    Spouse name: N/A  . Number of children: 6  . Years of education: N/A   Occupational History  . Ice business Self-Employed   Social History Main Topics  . Smoking status: Current Every Day Smoker    Packs/day: 0.50    Types: Cigarettes  . Smokeless tobacco: Never Used  . Alcohol use No  . Drug use: No  . Sexual activity: No   Other Topics Concern  . Not on file   Social History Narrative  . 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 Uterine cancer Mother   . Heart disease Sister        some heart trouble    Prior to Admission medications   Medication Sig Start Date End Date Taking? Authorizing Provider  calcium carbonate (TUMS EX) 750 MG chewable tablet Chew 1 tablet by mouth daily. Reported on 09/15/2015    [provider]  cholecalciferol (VITAMIN D) 1000 UNITS tablet Take 1,000 Units by mouth daily. Reported on 09/15/2015    [provider]  sertraline (ZOLOFT) 100 MG tablet TAKE 1/2 TABLET EVERY DAY  FOR  DEPRESSION 01/03/17   Abner Greenspan, MD    Physical Exam: Vitals:   02/06/17 0940 02/06/17 1115 02/06/17 1130 02/06/17 1200  BP: (!) 111/17 116/81 114/67 125/79  Pulse: (!) 110 66 70 67  Resp: 16 17 (!) 21 (!) 23  Temp: 97.7 F (36.5 C)     TempSrc: Oral     SpO2: 98% 100% 100% 100%  Weight: 56.2 kg (124 lb)     Height: 5' 7.5" (1.715 m)        General:  Appears calm and comfortable ) And somewhat pale Eyes:  PERRL, EOMI, normal lids, iris ENT:  grossly normal hearing, lips & tongue, mucous membranes of his mouth are moist and pink E has very poor dentition.  Neck:  no LAD, masses or thyromegaly Cardiovascular:  Irregularly irregular, no m/r/g. No LE edema.  Respiratory:  CTA bilaterally, no w/r/r. Normal respiratory effort. Breath sounds somewhat distant Abdomen:  soft, ntnd, positive bowel sounds throughout no guarding or rebounding Skin:  no rash or induration seen on limited exam Musculoskeletal:  grossly normal tone BUE/BLE, good ROM, no bony abnormality Psychiatric:  grossly normal mood and affect, speech fluent and appropriate, AOx3 Neurologic:  CN 2-12 grossly intact, moves all extremities in coordinated fashion, sensation intact  Labs on Admission: I have personally reviewed following labs and imaging studies  CBC:  Recent Labs Lab 02/06/17 0947 02/06/17 1009  WBC 9.9  --   NEUTROABS 7.2  --   HGB 13.0 13.9  HCT 40.4 41.0  MCV 90.4  --   PLT 224  --    Basic Metabolic Panel:  Recent Labs Lab 02/06/17 0947 02/06/17 1009  NA 137 138  K 4.1 4.1  CL 104 103  CO2 24  --   GLUCOSE 129* 128*  BUN 14 18  CREATININE 0.89 0.80  CALCIUM 9.3  --    GFR: Estimated Creatinine Clearance: 55.6 mL/min (by C-G formula based on SCr of 0.8 mg/dL). Liver Function Tests:  Recent Labs Lab 02/06/17 0947  AST 29  ALT 17  ALKPHOS 52  BILITOT 1.0  PROT 7.4  ALBUMIN 3.7   No  results for input(s): LIPASE, AMYLASE in the last 168 hours. No results for input(s): AMMONIA in the last 168 hours. Coagulation Profile:  Recent Labs Lab 02/06/17 0947  INR 1.03   Cardiac Enzymes: No results for input(s): CKTOTAL, CKMB, CKMBINDEX, TROPONINI in the last 168 hours. BNP (last 3 results) No results for input(s): PROBNP in the last 8760 hours. HbA1C: No results for input(s): HGBA1C in the last 72 hours. CBG:  Recent Labs Lab 02/06/17 1004  GLUCAP 123*   Lipid Profile: No results for input(s): CHOL, HDL, LDLCALC, TRIG, CHOLHDL, LDLDIRECT in the last 72  hours. Thyroid Function Tests: No results for input(s): TSH, T4TOTAL, FREET4, T3FREE, THYROIDAB in the last 72 hours. Anemia Panel: No results for input(s): VITAMINB12, FOLATE, FERRITIN, TIBC, IRON, RETICCTPCT in the last 72 hours. Urine analysis: No results found for: COLORURINE, APPEARANCEUR, LABSPEC, PHURINE, GLUCOSEU, HGBUR, BILIRUBINUR, KETONESUR, PROTEINUR, UROBILINOGEN, NITRITE, LEUKOCYTESUR  Creatinine Clearance: Estimated Creatinine Clearance: 55.6 mL/min (by C-G formula based on SCr of 0.8 mg/dL).  Sepsis Labs: _0 (procalcitonin:4,lacticidven:4) )No results found for this or any previous visit (from the past 240 hour(s)).   Radiological Exams on Admission: Ct Head Wo Contrast  Result Date: 02/06/2017 CLINICAL DATA:  83 year old male with a history of dizziness EXAM: CT HEAD WITHOUT CONTRAST TECHNIQUE: Contiguous axial images were obtained from the base of the skull through the vertex without intravenous contrast. COMPARISON:  None. FINDINGS: Brain: Confluent hypodense territory involving the inferior and medial right cerebellar hemisphere. No acute intracranial hemorrhage. Minimal mass effect resulting in slight narrowing of the fourth ventricle. Unremarkable configuration of the ventricles. No supratentorial hemorrhage, midline shift, mass effect. Gray-white differentiation of the anterior  circulation maintained. Vascular: Calcifications of intracranial vasculature. Sagittal reformatted images demonstrate questionable hyperdensity of the vertebrobasilar junction/inferior basilar artery. Skull: No acute bony abnormality.  No aggressive bone lesion. Sinuses/Orbits: No significant atherosclerotic changes. Other: None IMPRESSION: Subacute and completed infarction of the right posterior inferior cerebellar territory. No intracranial hemorrhage, with minimal local mass effect secondary to edema. Sagittal reformatted images demonstrate questionable hyperdensity of the vertebrobasilar junction and the inferior basilar artery, potentially representing thrombus. CT angiogram may be useful. Electronically Signed   By: Corrie Mckusick D.O.   On: 02/06/2017 11:07    EKG: Independently reviewed. Atrial fibrillation Anteroseptal infarct , age undetermined  Assessment/Plan Principal Problem:   Stroke [facility]) Active Problems:   Hyperlipidemia   TOBACCO ABUSE   COPD (chronic obstructive pulmonary disease) (H[facility])   Anxiety   Carotid stenosis   Aneurysm of abdominal vessel ([facility])   Atrial fibrillation, new onset ([place])   Carcinoma of vocal cord ([place])   #1. Stroke. CT of the head as noted above. Longtime smoker history of hyperlipidemia as well as carotid stenosis.. Outside window for intervention. -Admit to telemetry -Obtain an MRI and MRA -2-D echo carotid Dopplers -Chest x-ray -Obtain lipid panel hemoglobin A1c -Evaluation by physical therapy and occupational therapy -Speech therapy -And neuro checks -Nothing by mouth until passes bedside swallow eval -Hold off on aspirin for now due to history of ITP -Await recommendations from neuro  #2. Atrial fibrillation new onset. EKG as noted above. Rate controlled. Will need to decide about anticoagulation -Monitor -TSH -hold off on rate control agents for now -VS every 4 hours x 3  #3. COPD. Appears stable at baseline. He's not on home oxygen.  Continues to smoke. Oxygen saturation level greater than 90% on room air -Monitor  #4. Tobacco use.  -Cessation counseling offered  #5. Carcinoma of the vocal cords. 2008. Patient states "bread and pills get 100 my throat" -Speech therapy   DVT prophylaxis: lovenox  Code Status: full  Family Communication: wife at bedside  Disposition Plan: home  Consults called: neuro  Admission status: inpatient    Radene Gunning MD [facility]  If 7PM-7AM, please contact night-coverage [email or web address] Password [email or web address]  02/06/2017, 12:24 PM

## 2017-02-06 NOTE — ED Triage Notes (Signed)
Pt brought here by his wife who came home yesterday afternoon and found the pt laying on the floor beside vomit and a coffee pot he knocked down.  Initially pt was unable to speak, but eventually he was able to state that he was hit with "a bout of dizziness".  Today c/o R frontal headache (no bruising noted) and dizziness.

## 2017-02-06 NOTE — ED Provider Notes (Signed)
[facility] DEPT Provider Note   CSN: [id number] Arrival date & time: 02/06/17  0915     History   Chief Complaint Chief Complaint  Patient presents with  . Dizziness    HPI   Blood pressure (!) 111/17, pulse (!) 110, temperature 97.7 F (36.5 C), temperature source Oral, resp. rate 16, height 5' 7.5" (1.715 m), weight 56.2 kg (124 lb), SpO2 98 %.  Daniel Chapman is a 83 y.o. male with past medical history significant for hyperlipidemia, tobacco use disorder brought in for evaluation by wife she states that she found him at approximately 4 PM yesterday looking pale, not himself, not speaking much, he was sitting in a chair and then in the kitchen the coffee maker was on the floor and the tea kettle  was on the floor. She states that he vomited several times and fell before she came home. She states that he felt like the room was spinning, he was able to ambulate independently but he spent most of the day in the chair which is atypical for him. There was no slurred speech or unilateral weakness. When he woke this morning he had persistent weakness, he is more quiet than normal and wife called her PCP who advised him to come to the emergency department. 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  Past Medical History:  Diagnosis Date  . 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 Carotid artery occlusion   . COPD (chronic obstructive pulmonary disease) ([place])   . Fall due to ice or snow Feb. 22, 2015   Partial Right Hip replaced  . History of ITP   . Hx of colonic polyps   . Hyperlipemia   . Osteopenia    ?  Marland Kitchen Osteoporosis    dexa 06, declines further dexa or tx   . Smoker     Patient Active Problem List   Diagnosis Date Noted  . Stroke ([place]) 02/06/2017  . Atrial fibrillation, new onset ([place]) 02/06/2017  . Ischemic stroke ([place])   . Sebaceous cyst 01/03/2017  . Encounter for Medicare annual  wellness exam 12/17/2014  . Special screening for malignant neoplasms, colon 12/17/2014  . Routine general medical examination at a health care facility 12/09/2014  . Aneurysm of abdominal vessel ([place]) 09/11/2013  . S/P total hip arthroplasty 08/02/2013  . H/O splenectomy 08/02/2013  . History of hip fracture 07/20/2013  . Occlusion and stenosis of carotid artery without mention of cerebral infarction 08/28/2012  . Carotid stenosis 08/14/2012  . Bruit of left carotid artery 08/01/2012  . Anxiety 01/27/2012  . Osteopenia 07/19/2011  . Prostate cancer screening 07/10/2011  . HOARSENESS, CHRONIC 02/05/2007  . 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 Hyperlipidemia 11/06/2006  . TOBACCO ABUSE 11/06/2006  . COPD (chronic obstructive pulmonary disease) ([place]) 11/06/2006  . COLONIC POLYPS, HX OF 11/06/2006  . PROSTATITIS, HX OF 11/06/2006  . Carcinoma of vocal cord ([place]) 05/30/2006    Past Surgical History:  Procedure Laterality Date  . BONE MARROW BIOPSY  05/2003  . CATARACT EXTRACTION    . Dexa     per patient ? osteopenia  . HEMORRHOID SURGERY    . JOINT REPLACEMENT Right Feb. 22, 2015   Partial Hip  . SPLENECTOMY    . TONSILLECTOMY AND ADENOIDECTOMY Bilateral age 60 per patient       Home Medications    Prior to  Admission medications   Medication Sig Start Date End Date Taking? Authorizing Provider  calcium carbonate (TUMS EX) 750 MG chewable tablet Chew 1 tablet by mouth daily. Reported on 09/15/2015   Yes [provider]  cholecalciferol (VITAMIN D) 1000 UNITS tablet Take 1,000 Units by mouth daily. Reported on 09/15/2015   Yes [provider]  sertraline (ZOLOFT) 100 MG tablet TAKE 1/2 TABLET EVERY DAY  FOR  DEPRESSION 01/03/17  Yes Tower, [name], MD    Family History Family History  Problem Relation Age of Onset  . Uterine cancer Mother   . Heart disease Sister        some heart trouble    Social History Social History  Substance Use  Topics  . Smoking status: Current Every Day Smoker    Packs/day: 0.50    Types: Cigarettes  . Smokeless tobacco: Never Used  . Alcohol use No     Allergies   Patient has no known allergies.   Review of Systems Review of Systems  A complete review of systems was obtained and all systems are negative except as noted in the HPI and PMH.    Physical Exam Updated Vital Signs BP 133/85   Pulse 67   Temp 97.7 F (36.5 C) (Oral)   Resp 17   Ht 5' 7.5" (1.715 m)   Wt 56.2 kg (124 lb)   SpO2 100%   BMI 19.13 kg/m   Physical Exam  Constitutional: He is oriented to person, place, and time. He appears well-developed and well-nourished.  HOH  HENT:  Head: Normocephalic and atraumatic.  Mouth/Throat: Oropharynx is clear and moist.  Eyes: Pupils are equal, round, and reactive to light. Conjunctivae and EOM are normal.  Neck: Normal range of motion. Neck supple.  FROM to C-spine. Pt can touch chin to chest without discomfort. No TTP of midline cervical spine.   Cardiovascular: Normal rate, regular rhythm and intact distal pulses.   Pulmonary/Chest: Effort normal and breath sounds normal. No respiratory distress. He has no wheezes. He has no rales. He exhibits no tenderness.  Abdominal: Soft. Bowel sounds are normal. There is no tenderness.  Musculoskeletal: Normal range of motion. He exhibits no edema or tenderness.  Neurological: He is alert and oriented to person, place, and time. No cranial nerve deficit.  II-Visual fields grossly intact. III/IV/VI-Extraocular movements intact.  Pupils reactive bilaterally. V/VII-Smile symmetric, equal eyebrow raise,  facial sensation intact VIII- Hearing grossly intact IX/X-Normal gag XI-bilateral shoulder shrug XII-midline tongue extension Motor: 5/5 bilaterally with normal tone and bulk Cerebellar: Normal finger-to-nose  and normal heel-to-shin test.    Nursing note and vitals reviewed.    ED Treatments / Results  Labs (all labs  ordered are listed, but only abnormal results are displayed) Labs Reviewed  COMPREHENSIVE METABOLIC PANEL - Abnormal; Notable for the following:       Result Value   Glucose, Bld 129 (*)    All other components within normal limits  LIPID PANEL - Abnormal; Notable for the following:    HDL 29 (*)    All other components within normal limits  CBG MONITORING, ED - Abnormal; Notable for the following:    Glucose-Capillary 123 (*)    All other components within normal limits  I-STAT CHEM 8, ED - Abnormal; Notable for the following:    Glucose, Bld 128 (*)    All other components within normal limits  PROTIME-INR  APTT  CBC  DIFFERENTIAL  HEMOGLOBIN A1C  TSH  I-STAT  TROPONIN, ED    EKG  EKG Interpretation  Date/Time:  Monday February 06 2017 09:50:17 EDT Ventricular Rate:  76 PR Interval:    QRS Duration: 74 QT Interval:  424 QTC Calculation: 477 R Axis:   81 Text Interpretation:  Atrial fibrillation Anteroseptal infarct , age undetermined Abnormal ECG Compared to prior ECG, patient is now in atrial fibrillation Confirmed by Gareth Morgan [phone]) on 02/06/2017 11:53:53 AM       Radiology Dg Chest 2 View  Result Date: 02/06/2017 CLINICAL DATA:  Stroke.  No chest complaints. EXAM: CHEST  2 VIEW COMPARISON:  07/19/2011 FINDINGS: Hyperinflation. Lateral view degraded by patient arm position. Midthoracic compression deformity is mild to moderate and similar. Numerous leads and wires project over the chest. Midline trachea. Normal heart size and mediastinal contours for age. Left costophrenic angle excluded from the frontal radiograph. No pleural effusion or pneumothorax. Biapical pleural-parenchymal scarring. Diffuse peribronchial thickening. IMPRESSION: 1.  No acute cardiopulmonary disease. 2. COPD/chronic bronchitis. Pleural-parenchymal scarring at the apices. Electronically Signed   By: Abigail Miyamoto M.D.   On: 02/06/2017 12:42   Ct Head Wo Contrast  Result Date:  02/06/2017 CLINICAL DATA:  83 year old male with a history of dizziness EXAM: CT HEAD WITHOUT CONTRAST TECHNIQUE: Contiguous axial images were obtained from the base of the skull through the vertex without intravenous contrast. COMPARISON:  None. FINDINGS: Brain: Confluent hypodense territory involving the inferior and medial right cerebellar hemisphere. No acute intracranial hemorrhage. Minimal mass effect resulting in slight narrowing of the fourth ventricle. Unremarkable configuration of the ventricles. No supratentorial hemorrhage, midline shift, mass effect. Gray-white differentiation of the anterior circulation maintained. Vascular: Calcifications of intracranial vasculature. Sagittal reformatted images demonstrate questionable hyperdensity of the vertebrobasilar junction/inferior basilar artery. Skull: No acute bony abnormality.  No aggressive bone lesion. Sinuses/Orbits: No significant atherosclerotic changes. Other: None IMPRESSION: Subacute and completed infarction of the right posterior inferior cerebellar territory. No intracranial hemorrhage, with minimal local mass effect secondary to edema. Sagittal reformatted images demonstrate questionable hyperdensity of the vertebrobasilar junction and the inferior basilar artery, potentially representing thrombus. CT angiogram may be useful. Electronically Signed   By: Corrie Mckusick D.O.   On: 02/06/2017 11:07    Procedures Procedures (including critical care time)  Medications Ordered in ED Medications   stroke: mapping our early stages of recovery book (not administered)  0.9 %  sodium chloride infusion (not administered)  acetaminophen (TYLENOL) tablet 650 mg (not administered)    Or  acetaminophen (TYLENOL) solution 650 mg (not administered)    Or  acetaminophen (TYLENOL) suppository 650 mg (not administered)  senna-docusate (Senokot-S) tablet 1 tablet (not administered)  enoxaparin (LOVENOX) injection 40 mg (not administered)  iopamidol  (ISOVUE-370) 76 % injection (50 mLs  Contrast Given 02/06/17 1347)     Initial Impression / Assessment and Plan / ED Course  I have reviewed the triage vital signs and the nursing notes.  Pertinent labs & imaging results that were available during my care of the patient were reviewed by me and considered in my medical decision making (see chart for details).     Vitals:   02/06/17 1130 02/06/17 1200 02/06/17 1245 02/06/17 1315  BP: 114/67 125/79 130/81 133/85  Pulse: 70 67    Resp: (!) 21 (!) 23 (!) 21 17  Temp:      TempSrc:      SpO2: 100% 100%    Weight:      Height:        Medications  stroke: mapping our early stages of recovery book (not administered)  0.9 %  sodium chloride infusion (not administered)  acetaminophen (TYLENOL) tablet 650 mg (not administered)    Or  acetaminophen (TYLENOL) solution 650 mg (not administered)    Or  acetaminophen (TYLENOL) suppository 650 mg (not administered)  senna-docusate (Senokot-S) tablet 1 tablet (not administered)  enoxaparin (LOVENOX) injection 40 mg (not administered)  iopamidol (ISOVUE-370) 76 % injection (50 mLs  Contrast Given 02/06/17 1347)    JKWON TREPTOW is 83 y.o. male presenting with vertigo, fall and emesis with associated right sided headache onset yesterday afternoon. Patient with tobacco use, hyperlipidemia. Nonfocal neurologic exam however I did not walk him. CAT scan with subacute infarct. Patient states that since he had ITP he does not want aspirin. Patient with new onset atrial fibrillation. Unsure time of onset is patient denies any chest pain, palpitations. He's never seen a cardiologist in the past. Admitted to [facility] hospitalist.This with Dr. Cheral Marker neurology will evaluate on the floor.   Final Clinical Impressions(s) / ED Diagnoses   Final diagnoses:  Cerebrovascular accident (CVA), unspecified mechanism ([place])  Atrial fibrillation, unspecified type [facility])    New Prescriptions New Prescriptions   No  medications on file     Waynetta Pean 02/06/17 [place], MD 02/08/17 1201

## 2017-02-06 NOTE — ED Notes (Signed)
Triage charted by this RN under emt [place]

## 2017-02-06 NOTE — Progress Notes (Signed)
Pt left unit for MRI.

## 2017-02-06 NOTE — Progress Notes (Signed)
Pt returned from MRI °

## 2017-02-06 NOTE — Telephone Encounter (Signed)
Looks like he is in the ED now -will continue to follow

## 2017-02-06 NOTE — Progress Notes (Signed)
Pt NPO at this time pt and wife agitated because per ED nurse pt failed swallow screen. Spoke with speech therapist who stated that she would complete evaluation in the am. Pt remains on IV fluids.

## 2017-02-06 NOTE — ED Triage Notes (Signed)
PT home alone and when wife came home she found pt sitting in chair . Pt reported he felt like the room went round and round . Pt also vomited x3 since Sunday.

## 2017-02-07 ENCOUNTER — Inpatient Hospital Stay (HOSPITAL_COMMUNITY): Payer: Medicare HMO

## 2017-02-07 DIAGNOSIS — C320 Malignant neoplasm of glottis: Secondary | ICD-10-CM

## 2017-02-07 DIAGNOSIS — I6521 Occlusion and stenosis of right carotid artery: Secondary | ICD-10-CM

## 2017-02-07 LAB — HEMOGLOBIN A1C
Hgb A1c MFr Bld: 6.2 % — ABNORMAL HIGH (ref 4.8–5.6)
Mean Plasma Glucose: 131 mg/dL

## 2017-02-07 MED ORDER — CLOPIDOGREL BISULFATE 75 MG PO TABS
75.0000 mg | ORAL_TABLET | Freq: Every day | ORAL | Status: DC
  Administered 2017-02-07 – 2017-02-10 (×4): 75 mg via ORAL
  Filled 2017-02-07 (×4): qty 1

## 2017-02-07 MED ORDER — ONDANSETRON HCL 4 MG/2ML IJ SOLN: 4.0000 mg | Freq: Four times a day (QID) | INTRAMUSCULAR | Status: DC | PRN

## 2017-02-07 NOTE — Progress Notes (Signed)
Rehab Admissions Coordinator Note:  Patient was screened by Cleatrice Burke for appropriateness for an Inpatient Acute Rehab Consult per PT and OT recommendations.  At this time, we are recommending Inpatient Rehab consult.  Cleatrice Burke 02/07/2017, 6:11 PM  I can be reached at [phone].

## 2017-02-07 NOTE — Progress Notes (Signed)
Modified Barium Swallow Progress Note  Patient Details  Name: Daniel Chapman MRN: [id number] Date of Birth: 1931/08/31  Today's Date: 02/07/2017  Modified Barium Swallow completed.  Full report located under Chart Review in the Imaging Section.  Brief recommendations include the following:  Clinical Impression  Baseline coughing and expectorating secretions noted.  Pt presents with moderate pharyngeal dysphagia - suspect exacerbation of baseline deficits *known h/o dysphagia from vocal cord cancer s/p XRT.  Decreased pharyngeal contraction/tongue base retraction and epiglottic deflection results in residuals (vallecular worse than pyriform sinus) without pt consistent awareness.   Cued dry swallow helpful to decrease residuals that pt does NOT sense and following solid with liquid helpful.  Pt suspected to aspirate with consumption of thin via straw *although missed actual swallow on flouro*.  Pt overtly coughed immediately postswallow of thin via straw - with at least laryngeal penetration observed.  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  Cough with backflow of barium into pharynx and vomiting observed - suspect due to cerebellar cva.  NO aspiration of emesis noted.  Recommend start clear liquid diet with strict precautions.  Using teach back, pt was educated to findings/recommendations thoroughly.     Swallow Evaluation Recommendations       SLP Diet Recommendations: Thin liquid (clears)   Liquid Administration via: Cup;No straw   Medication Administration: Other (Comment) (crushed with puree)   Supervision: Patient able to self feed   Compensations: Small sips/bites;Slow rate;Follow solids with liquid;Multiple dry swallows after each bite/sip;Other (Comment);Effortful swallow (cough/hock to clear residuals)   Postural Changes: Seated upright at 90 degrees;Remain semi-upright after after feeds/meals (Comment)   Oral Care  Recommendations: Oral care before and after [name], [name] SLP [phone]  [name] 02/07/2017,10:17 AM

## 2017-02-07 NOTE — Progress Notes (Signed)
Occupational Therapy Evaluation Patient Details Name: Daniel Chapman MRN: [id number] DOB: [birth year] Today's Date: 02/07/2017    History of Present Illness 83 y.o. male with medical history significant for COPD not on home oxygen, tobacco use, vocal cord cancer, ITP, hyperlipidemia, carotid artery occlusion presents to emergency Department chief complaint dizziness & fall. MRI + R inferior  cerebellum infarct.   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 Pt presents with significant functional decline with ADL and functional mobility due to deficits listed below. Recommend CIR to maximize functional level of independence to facilitate safe DC home with supportive family. Will follow acutely to address established goals and facilitate DC to next venue of care.     Follow Up Recommendations  CIR;Supervision/Assistance - 24 hour    Equipment Recommendations  3 in 1 bedside commode;Tub/shower bench    Recommendations for Other Services Rehab consult     Precautions / Restrictions Precautions Precautions: Fall Restrictions Weight Bearing Restrictions: No      Mobility Bed Mobility Overal bed mobility: Needs Assistance Bed Mobility: Supine to Sit     Supine to sit: Supervision Sit to supine: Supervision   General bed mobility comments: Pt able to move from supine to sit with no increased time or effort, but supervision for safety.  Transfers Overall transfer level: Needs assistance Equipment used: None Transfers: Sit to/from [facility] Sit to Stand: Min assist Stand pivot transfers: Mod assist       General transfer comment: LOB toward R    Balance Overall balance assessment: Needs assistance Sitting-balance support: Feet supported;No upper extremity supported Sitting balance-Leahy Scale: Fair   Postural control: Posterior lean;Right  lateral lean Standing balance support: During functional activity;No upper extremity supported Standing balance-Leahy Scale: Poor Standing balance comment: pt demonstrated posterior lean with rhomberg (eyes closed)                 ADL either performed or assessed with clinical judgement   ADL Overall ADL's : Needs assistance/impaired Eating/Feeding: Supervision/ safety;Set up;Sitting Eating/Feeding Details (indicate cue type and reason): modified  Grooming: Standing;Minimal assistance   Upper Body Bathing: Supervision/ safety;Set up;Sitting   Lower Body Bathing: Moderate assistance;Sit to/from stand   Upper Body Dressing : Supervision/safety;Set up;Sitting   Lower Body Dressing: Moderate assistance;Sit to/from stand   Toilet Transfer: Moderate assistance;Ambulation   Toileting- Clothing Manipulation and Hygiene: Minimal assistance;Sit to/from stand       Functional mobility during ADLs: Moderate assistance;Cueing for safety General ADL Comments: Difficulty completing ADL tasks without supoprt     Vision Baseline Vision/History: Wears glasses Wears Glasses: Reading only Patient Visual Report: No change from baseline Additional Comments: will further assess. Demosntrtes difficulty maitnaining visual fixation on target out of midline. ? feelings of dizziness with eye movements     Perception     Praxis Praxis Praxis tested?: Within functional limits    Pertinent Vitals/Pain Pain Assessment: No/denies pain     Hand Dominance Left   Extremity/Trunk Assessment Upper Extremity Assessment Upper Extremity Assessment: Overall WFL for tasks assessed (appears to use BUE functionally). Will further assess.    Lower Extremity Assessment Lower Extremity Assessment: Defer to PT evaluation    Cervical / Trunk Assessment Cervical / Trunk Assessment: Other exceptions Cervical / Trunk Exceptions: R bias   Communication Communication Communication: No difficulties    Cognition Arousal/Alertness: Awake/alert Behavior During Therapy: WFL for  tasks assessed/performed Overall Cognitive Status: Impaired/Different from baseline Area of Impairment: Safety/judgement;Awareness                         Safety/Judgement: Decreased awareness of safety;Decreased awareness of deficits Awareness: Emergent   General Comments: will further assess   General Comments       Exercises     Shoulder Instructions      Home Living Family/patient expects to be discharged to:: Private residence Living Arrangements: Spouse/significant other Available Help at Discharge: Family;Available 24 hours/day Type of Home: House Home Access: Stairs to enter [employer] of Steps: 3-4   Home Layout: Two level;Able to live on main level with bedroom/bathroom     Bathroom Shower/Tub: Teacher, early years/pre: Handicapped height Bathroom Accessibility: Yes How Accessible: Accessible via walker Home Equipment: Shower seat;Adaptive equipment Adaptive Equipment: Reacher;Sock aid;Long-handled shoe horn;Long-handled sponge    Lives With: Spouse    Prior Functioning/Environment Level of Independence: Independent        Comments: Prior to incident, pt was independent with mobility and ADLs. 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; hx of hip replacement        OT Problem List: Decreased activity tolerance;Impaired balance (sitting and/or standing);Impaired vision/perception;Decreased coordination;Decreased safety awareness      OT Treatment/Interventions: Self-care/ADL training;Therapeutic exercise;Neuromuscular education;DME and/or AE instruction;Therapeutic activities;Cognitive remediation/compensation;Visual/perceptual remediation/compensation;Patient/family education;Balance training    OT Goals(Current goals can be found in the care plan section) Acute Rehab OT Goals Patient Stated Goal: return to prior level of independence OT  Goal Formulation: With patient Time For Goal Achievement: 02/21/17 Potential to Achieve Goals: Good ADL Goals Pt Will Perform Upper Body Bathing: with modified independence;sitting Pt Will Perform Lower Body Bathing: with supervision;sit to/from stand;with caregiver independent in assisting Pt Will Perform Upper Body Dressing: with set-up;sitting Pt Will Perform Lower Body Dressing: with supervision;sit to/from stand;with caregiver independent in assisting Pt Will Transfer to Toilet: with supervision;ambulating Pt Will Perform Toileting - Clothing Manipulation and hygiene: with supervision;sit to/from stand Additional ADL Goal #1: Pt will demonstrate use of gaze stabilization compensatory strategies dueint functional mobility with min vc  OT Frequency: Min 2X/week   Barriers to D/C:            Co-evaluation              AM-PAC PT "6 Clicks" Daily Activity     Outcome Measure Help from another person eating meals?: A Little Help from another person taking care of personal grooming?: A Little Help from another person toileting, which includes using toliet, bedpan, or urinal?: A Little Help from another person bathing (including washing, rinsing, drying)?: A Little Help from another person to put on and taking off regular upper body clothing?: A Little Help from another person to put on and taking off regular lower body clothing?: A Lot 6 Click Score: 17   End of Session Equipment Utilized During Treatment: Gait belt Nurse Communication: Mobility status  Activity Tolerance: Patient tolerated treatment well Patient left: in bed;with call bell/phone within reach;with bed alarm set;with family/visitor present  OT Visit Diagnosis: Other abnormalities of gait and mobility (R26.89);History of falling (Z91.81);Dizziness and giddiness (R42)                Time: 8338-2505 OT Time Calculation (min): 23 min Charges:  OT General Charges $OT Visit: 1 Visit OT Evaluation $OT Eval  Moderate Complexity: 1 Mod OT Treatments $Self Care/Home Management : 8-22 mins  G-CodesMaurie Boettcher, OT/L  [phone] 02/07/2017  Imya Mance,[place] 02/07/2017, 5:26 PM

## 2017-02-07 NOTE — Progress Notes (Signed)
Upon chart review, pt has h/o vocal fold cancer s/p radiation tx and now with new cerebellar cva- will plan MBS this am.  RN informed. Luanna Salk, [place] [facility] SLP [phone]

## 2017-02-07 NOTE — Progress Notes (Signed)
Physical Therapy Evaluation Patient Details Name: Daniel Chapman MRN: [id number] DOB: 1932/02/24 Today's Date: 02/07/2017   History of Present Illness  83 y.o. male with medical history significant for COPD not on home oxygen, tobacco use, vocal cord cancer, ITP, hyperlipidemia, carotid artery occlusion presents to emergency Department chief complaint dizziness & fall. MRI + R inferior  cerebellum infarct.  Clinical Impression  Prior to incident patient was independent with functional mobility and self-care activities. Wife stated that he was able to jump up and click his heels together. Pt presents with instability and incoordination with ambulating and basic transfers. When standing from a seated position, pt demonstrates significant posterior lean and needs physical assistance to maintain stability. During ambulation, pt staggers to the left and was unstable throughout. Based on the deficits noted and h/o ITP, patient is a high fall risk and it would greatly benefit him to receive intense comprehensive rehabilitation to prevent future detrimental falls and quickly return back to baseline functional mobility status. He will continue to benefit from PT services in the acute setting and is a great candidate for CIR upon d/c.     Follow Up Recommendations CIR;Supervision for mobility/OOB    Equipment Recommendations  None recommended by PT    Recommendations for Other Services Rehab consult     Precautions / Restrictions Precautions Precautions: Fall Restrictions Weight Bearing Restrictions: No      Mobility  Bed Mobility Overal bed mobility: Needs Assistance Bed Mobility: Supine to Sit     Supine to sit: Supervision     General bed mobility comments: Pt able to move from supine to sit with no increased time or effort, but supervision for safety.  Transfers Overall transfer level: Needs assistance Equipment used: None Transfers: Sit to/from Stand Sit to Stand: Min assist          General transfer comment: When pt transferred from sit to stand LEs against bed (min assist for stability)   Ambulation/Gait Ambulation/Gait assistance: Min assist;Mod assist Ambulation Distance (Feet): 180 Feet (two bouts of 90 feet) Assistive device: None Gait Pattern/deviations: Step-through pattern;Leaning posteriorly;Staggering left;Ataxic Gait velocity: decreased   General Gait Details: during amb pt demonstrated 3 LOBs mod assist to prevent fall. Gait ataxic with posterior bias and staggers to the left intermittently.  Stairs            Wheelchair Mobility    Modified Rankin (Stroke Patients Only) Modified Rankin (Stroke Patients Only) Pre-Morbid Rankin Score: No symptoms Modified Rankin: Moderately severe disability     Balance Overall balance assessment: Needs assistance Sitting-balance support: Feet supported;No upper extremity supported Sitting balance-Leahy Scale: Good   Postural control: Posterior lean (during standing and gait) Standing balance support: During functional activity;No upper extremity supported Standing balance-Leahy Scale: Fair Standing balance comment: pt demonstrated posterior lean with rhomberg (eyes closed)         Rhomberg - Eyes Opened: 0 Rhomberg - Eyes Closed: 0 High level balance activites: Turns;Head turns;Sudden stops High Level Balance Comments: Pt demonstrated instability and ~3 LOB with higher level balance activities during ambulation. Head turns decreased gait speed and pt had to stop before he could resume walking with a new head turn.             Pertinent Vitals/Pain Pain Assessment: No/denies pain    Home Living Family/patient expects to be discharged to:: Private residence Living Arrangements: Spouse/significant other Available Help at Discharge: Family;Available 24 hours/day Type of Home: House Home Access: Stairs to enter   Entrance  Stairs-Number of Steps: 3-4 Home Layout: Two level;Able to live  on main level with bedroom/bathroom Home Equipment: Shower seat;Adaptive equipment      Prior Function Level of Independence: Independent         Comments: Prior to incident, pt was independent with mobility and ADLs. 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; hx of hip replacement     Hand Dominance   Dominant Hand: Left    Extremity/Trunk Assessment   Upper Extremity Assessment Upper Extremity Assessment: Defer to OT evaluation    Lower Extremity Assessment Lower Extremity Assessment: RLE deficits/detail;LLE deficits/detail RLE Coordination: decreased gross motor (but during functional activities) LLE Coordination: decreased gross motor (during functional activities)       Communication   Communication: No difficulties  Cognition Arousal/Alertness: Awake/alert Behavior During Therapy: WFL for tasks assessed/performed Overall Cognitive Status: Within Functional Limits for tasks assessed                                        General Comments      Exercises     Assessment/Plan    PT Assessment Patient needs continued PT services  PT Problem List Decreased balance;Decreased coordination       PT Treatment Interventions Gait training;Stair training;Functional mobility training;Balance training    PT Goals (Current goals can be found in the Care Plan section)  Acute Rehab PT Goals Patient Stated Goal: return to prior level of independence PT Goal Formulation: With patient Time For Goal Achievement: 02/21/17 Potential to Achieve Goals: Good    Frequency Min 3X/week   Barriers to discharge        Co-evaluation               AM-PAC PT "6 Clicks" Daily Activity  Outcome Measure Difficulty turning over in bed (including adjusting bedclothes, sheets and blankets)?: A Little Difficulty moving from lying on back to sitting on the side of the bed? : A Little Difficulty sitting down on and standing up from a chair with arms  (e.g., wheelchair, bedside commode, etc,.)?: Unable Help needed moving to and from a bed to chair (including a wheelchair)?: A Little Help needed walking in hospital room?: A Little Help needed climbing 3-5 steps with a railing? : A Lot 6 Click Score: 15    End of Session Equipment Utilized During Treatment: Gait belt Activity Tolerance: Patient tolerated treatment well Patient left: in bed;Other (comment) (with OT) Nurse Communication: Mobility status PT Visit Diagnosis: Unsteadiness on feet (R26.81);Ataxic gait (R26.0)    Time: 1914-7829 PT Time Calculation (min) (ACUTE ONLY): 17 min   Charges:   PT Evaluation $PT Eval Moderate Complexity: 1 Mod     PT G CodesSindy Guadeloupe, SPT [phone] office   Margarita Grizzle 02/07/2017, 5:07 PM

## 2017-02-07 NOTE — Progress Notes (Signed)
Progress Note  Patient Name: Daniel Chapman Date of Encounter: 02/07/2017  Primary Cardiologist: Dr.Burdell Peed (new)  Subjective   I asked patient how he is feeling. He joked, "with my hands." His wife smiled and said, "you have to know he's the wild and crazy one." patient denies any complaints. No awareness of palpitations.  Inpatient Medications    Scheduled Meds: .  stroke: mapping our early stages of recovery book   Does not apply Once  . clopidogrel  75 mg Oral Daily  . enoxaparin (LOVENOX) injection  40 mg Subcutaneous Q24H  . rosuvastatin  10 mg Oral q1800   Continuous Infusions: . sodium chloride 50 mL/hr at 02/06/17 1646   PRN Meds: acetaminophen **OR** acetaminophen (TYLENOL) oral liquid 160 mg/5 mL **OR** acetaminophen, senna-docusate   Vital Signs    Vitals:   02/07/17 0100 02/07/17 0300 02/07/17 0554 02/07/17 0932  BP: 125/82 128/87 (!) 148/81 (!) 147/92  Pulse: 70 77 85 69  Resp: '18 18 18 20  ' Temp: 97.7 F (36.5 C) 97.7 F (36.5 C) 97.7 F (36.5 C) 97.6 F (36.4 C)  TempSrc: Oral Oral Oral Oral  SpO2: 96% 98% 98% 97%  Weight:      Height:        Intake/Output Summary (Last 24 hours) at 02/07/17 1147 Last data filed at 02/07/17 0300  Gross per 24 hour  Intake           511.67 ml  Output              200 ml  Net           311.67 ml   Filed Weights   02/06/17 0940 02/06/17 1531  Weight: 124 lb (56.2 kg) 124 lb 11.2 oz (56.6 kg)    Telemetry    Atrial fib - rates generally controlled, rare spike this AM- Personally Reviewed  Physical Exam   GEN: No acute distress. Very thin HEENT: Normocephalic, atraumatic, sclera non-icteric. Neck: No JVD or bruits. Cardiac: RRR no murmurs, rubs, or gallops.  Radials/DP/PT 1+ and equal bilaterally.  Respiratory: Clear to auscultation bilaterally. Breathing is unlabored. GI: Soft, nontender, non-distended, BS +x 4. MS: no deformity. Extremities: No clubbing or cyanosis. No edema. Distal pedal pulses are  2+ and equal bilaterally. Neuro:  AAOx3. Follows commands. Psych:  Responds to questions appropriately with a normal affect.  Labs    Chemistry Recent Labs Lab 02/06/17 0947 02/06/17 1009  NA 137 138  K 4.1 4.1  CL 104 103  CO2 24  --   GLUCOSE 129* 128*  BUN 14 18  CREATININE 0.89 0.80  CALCIUM 9.3  --   PROT 7.4  --   ALBUMIN 3.7  --   AST 29  --   ALT 17  --   ALKPHOS 52  --   BILITOT 1.0  --   GFRNONAA >60  --   GFRAA >60  --   ANIONGAP 9  --      Hematology Recent Labs Lab 02/06/17 0947 02/06/17 1009  WBC 9.9  --   RBC 4.47  --   HGB 13.0 13.9  HCT 40.4 41.0  MCV 90.4  --   MCH 29.1  --   MCHC 32.2  --   RDW 15.4  --   PLT 224  --     Cardiac EnzymesNo results for input(s): TROPONINI in the last 168 hours.  Recent Labs Lab 02/06/17 [place] 0.00     Radiology  Ct Angio Head W Or Wo Contrast  Result Date: 02/06/2017 CLINICAL DATA:  Acute right cerebellar infarction. EXAM: CT ANGIOGRAPHY HEAD AND NECK TECHNIQUE: Multidetector CT imaging of the head and neck was performed using the standard protocol during bolus administration of intravenous contrast. Multiplanar CT image reconstructions and MIPs were obtained to evaluate the vascular anatomy. Carotid stenosis measurements (when applicable) are obtained utilizing NASCET criteria, using the distal internal carotid diameter as the denominator. CONTRAST:  50 cc Isovue 370 COMPARISON:  Head CT same day FINDINGS: CTA NECK FINDINGS Aortic arch: Aortic atherosclerosis. No aneurysm or dissection. Branching pattern of the brachiocephalic vessels from the arch is normal. Atherosclerotic disease at the brachiocephalic vessel origins but without stenosis greater than about 20%. Right carotid system: Common carotid artery shows atherosclerotic disease with narrowing in the mid portion of 40%. Occlusion of the right ICA at the origin. No antegrade flow in the ICA at the skullbase. Left carotid system: Common  carotid artery widely patent to the bifurcation region. Motion degradation at the bifurcation. Streak artifact at the bifurcation. Atherosclerotic disease with soft and calcified plaque. Stenosis of the proximal ICA estimated at 50%. Motion degradation in the cervical ICA region without suspicion of flow limiting stenosis in that region. Vertebral arteries: Right vertebral artery origin widely patent. No subclavian stenosis proximal or distal to that. Right vertebral artery is widely patent through the cervical region to the foramen magnum. Left subclavian artery shows a focal way lobe or localized dissection just proximal to the left vertebral artery origin. There is severe stenosis at the left vertebral artery origin, 80% or greater. The vessel is patent beyond [facility] through the cervical region to the foramen magnum. Skeleton: Ordinary mid cervical spondylosis. Other neck: No mass or lymphadenopathy. Benign appearing sebaceous cyst of the right lower neck. Upper chest: Extensive upper lobe emphysema with pleural and parenchymal scarring. Regions of scarring appear masslike and are progressive since the most recent chest CT of 05/05/2010. Depending on the clinical status, complete chest CT would be suggested if the patient is a candidate for treatment of pulmonary disease. Review of the MIP images confirms the above findings CTA HEAD FINDINGS Anterior circulation: Left internal carotid artery is patent through the skullbase and siphon regions. There is some external to internal collateral flow with reconstitution of the distal siphon and supraclinoid ICA on the right. There are also patent anterior and posterior communicating arteries. No ACA or MCA stenosis. No aneurysm or vascular malformation. Posterior circulation: Both vertebral arteries are patent at the foramen magnum level. Flow is present in both posterior inferior cerebellar artery is presently. Both vertebral artery supply the basilar. No basilar  stenosis. Left anterior inferior cerebellar artery, superior cerebellar arteries and posterior cerebral arteries are patent bilaterally without evidence of flow limiting stenosis or missing components. It is not possible to determine if the right anterior inferior cerebellar artery is occluded or absent developmentally. Venous sinuses: Patent and normal Anatomic variants: None other significant Delayed phase: No abnormal enhancement Review of the MIP images confirms the above findings IMPRESSION: Advanced atherosclerotic disease of the thoracic aorta but without evidence of aneurysm or dissection. Atherosclerotic disease at the brachiocephalic vessel origins but without stenosis greater than 20%. 40% stenosis in the midportion of the right common carotid artery. Complete occlusion of the right internal carotid artery at its origin. Atherosclerotic disease at the left carotid bifurcation. Detail is limited by artifact and motion. Stenosis is estimated at 50% at the proximal ICA. Right vertebral artery widely  patent through the neck. Web or focal dissection of the left subclavian artery just proximal to the left vertebral artery origin. Severe stenosis of the left vertebral artery origin, 80% or greater. The vessel is widely patent beyond that. Both posterior inferior cerebellar arteries are patent presently. No visible anterior inferior cerebellar artery on the right. This could be occluded or congenitally absent. Right ICA reconstitution in the supraclinoid region secondary to external to internal collaterals, patent anterior communicating artery and patent posterior communicating artery. Parenchymal distributions of the anterior and middle cerebral vessels appear intact. Advanced chronic lung disease with advanced emphysema. Pleural and parenchymal scarring at both apices, with increased density compared to the CT chest of 2011. I cannot rule out the possibility of tumor in this region, though this may merely be  progressive scarring. If the patient is a candidate for treatment pulmonary disease, complete chest CT would be warranted. Electronically Signed   By:  Chimes M.D.   On: 02/06/2017 14:33   Dg Chest 2 View  Result Date: 02/06/2017 CLINICAL DATA:  Stroke.  No chest complaints. EXAM: CHEST  2 VIEW COMPARISON:  07/19/2011 FINDINGS: Hyperinflation. Lateral view degraded by patient arm position. Midthoracic compression deformity is mild to moderate and similar. Numerous leads and wires project over the chest. Midline trachea. Normal heart size and mediastinal contours for age. Left costophrenic angle excluded from the frontal radiograph. No pleural effusion or pneumothorax. Biapical pleural-parenchymal scarring. Diffuse peribronchial thickening. IMPRESSION: 1.  No acute cardiopulmonary disease. 2. COPD/chronic bronchitis. Pleural-parenchymal scarring at the apices. Electronically Signed   By: Abigail Miyamoto M.D.   On: 02/06/2017 12:42   Ct Head Wo Contrast  Result Date: 02/06/2017 CLINICAL DATA:  83 year old male with a history of dizziness EXAM: CT HEAD WITHOUT CONTRAST TECHNIQUE: Contiguous axial images were obtained from the base of the skull through the vertex without intravenous contrast. COMPARISON:  None. FINDINGS: Brain: Confluent hypodense territory involving the inferior and medial right cerebellar hemisphere. No acute intracranial hemorrhage. Minimal mass effect resulting in slight narrowing of the fourth ventricle. Unremarkable configuration of the ventricles. No supratentorial hemorrhage, midline shift, mass effect. Gray-white differentiation of the anterior circulation maintained. Vascular: Calcifications of intracranial vasculature. Sagittal reformatted images demonstrate questionable hyperdensity of the vertebrobasilar junction/inferior basilar artery. Skull: No acute bony abnormality.  No aggressive bone lesion. Sinuses/Orbits: No significant atherosclerotic changes. Other: None IMPRESSION:  Subacute and completed infarction of the right posterior inferior cerebellar territory. No intracranial hemorrhage, with minimal local mass effect secondary to edema. Sagittal reformatted images demonstrate questionable hyperdensity of the vertebrobasilar junction and the inferior basilar artery, potentially representing thrombus. CT angiogram may be useful. Electronically Signed   By: Corrie Mckusick D.O.   On: 02/06/2017 11:07   Ct Angio Neck W Or Wo Contrast  Result Date: 02/06/2017 CLINICAL DATA:  Acute right cerebellar infarction. EXAM: CT ANGIOGRAPHY HEAD AND NECK TECHNIQUE: Multidetector CT imaging of the head and neck was performed using the standard protocol during bolus administration of intravenous contrast. Multiplanar CT image reconstructions and MIPs were obtained to evaluate the vascular anatomy. Carotid stenosis measurements (when applicable) are obtained utilizing NASCET criteria, using the distal internal carotid diameter as the denominator. CONTRAST:  50 cc Isovue 370 COMPARISON:  Head CT same day FINDINGS: CTA NECK FINDINGS Aortic arch: Aortic atherosclerosis. No aneurysm or dissection. Branching pattern of the brachiocephalic vessels from the arch is normal. Atherosclerotic disease at the brachiocephalic vessel origins but without stenosis greater than about 20%. Right carotid system: Common carotid  artery shows atherosclerotic disease with narrowing in the mid portion of 40%. Occlusion of the right ICA at the origin. No antegrade flow in the ICA at the skullbase. Left carotid system: Common carotid artery widely patent to the bifurcation region. Motion degradation at the bifurcation. Streak artifact at the bifurcation. Atherosclerotic disease with soft and calcified plaque. Stenosis of the proximal ICA estimated at 50%. Motion degradation in the cervical ICA region without suspicion of flow limiting stenosis in that region. Vertebral arteries: Right vertebral artery origin widely patent. No  subclavian stenosis proximal or distal to that. Right vertebral artery is widely patent through the cervical region to the foramen magnum. Left subclavian artery shows a focal way lobe or localized dissection just proximal to the left vertebral artery origin. There is severe stenosis at the left vertebral artery origin, 80% or greater. The vessel is patent beyond [facility] through the cervical region to the foramen magnum. Skeleton: Ordinary mid cervical spondylosis. Other neck: No mass or lymphadenopathy. Benign appearing sebaceous cyst of the right lower neck. Upper chest: Extensive upper lobe emphysema with pleural and parenchymal scarring. Regions of scarring appear masslike and are progressive since the most recent chest CT of 05/05/2010. Depending on the clinical status, complete chest CT would be suggested if the patient is a candidate for treatment of pulmonary disease. Review of the MIP images confirms the above findings CTA HEAD FINDINGS Anterior circulation: Left internal carotid artery is patent through the skullbase and siphon regions. There is some external to internal collateral flow with reconstitution of the distal siphon and supraclinoid ICA on the right. There are also patent anterior and posterior communicating arteries. No ACA or MCA stenosis. No aneurysm or vascular malformation. Posterior circulation: Both vertebral arteries are patent at the foramen magnum level. Flow is present in both posterior inferior cerebellar artery is presently. Both vertebral artery supply the basilar. No basilar stenosis. Left anterior inferior cerebellar artery, superior cerebellar arteries and posterior cerebral arteries are patent bilaterally without evidence of flow limiting stenosis or missing components. It is not possible to determine if the right anterior inferior cerebellar artery is occluded or absent developmentally. Venous sinuses: Patent and normal Anatomic variants: None other significant Delayed phase: No  abnormal enhancement Review of the MIP images confirms the above findings IMPRESSION: Advanced atherosclerotic disease of the thoracic aorta but without evidence of aneurysm or dissection. Atherosclerotic disease at the brachiocephalic vessel origins but without stenosis greater than 20%. 40% stenosis in the midportion of the right common carotid artery. Complete occlusion of the right internal carotid artery at its origin. Atherosclerotic disease at the left carotid bifurcation. Detail is limited by artifact and motion. Stenosis is estimated at 50% at the proximal ICA. Right vertebral artery widely patent through the neck. Web or focal dissection of the left subclavian artery just proximal to the left vertebral artery origin. Severe stenosis of the left vertebral artery origin, 80% or greater. The vessel is widely patent beyond that. Both posterior inferior cerebellar arteries are patent presently. No visible anterior inferior cerebellar artery on the right. This could be occluded or congenitally absent. Right ICA reconstitution in the supraclinoid region secondary to external to internal collaterals, patent anterior communicating artery and patent posterior communicating artery. Parenchymal distributions of the anterior and middle cerebral vessels appear intact. Advanced chronic lung disease with advanced emphysema. Pleural and parenchymal scarring at both apices, with increased density compared to the CT chest of 2011. I cannot rule out the possibility of tumor in this region,  though this may merely be progressive scarring. If the patient is a candidate for treatment pulmonary disease, complete chest CT would be warranted. Electronically Signed   By:  Chimes M.D.   On: 02/06/2017 14:33   Mr Brain Wo Contrast  Result Date: 02/06/2017 CLINICAL DATA:  83 y/o  M; follow-up of stroke. EXAM: MRI HEAD WITHOUT CONTRAST MRA HEAD WITHOUT CONTRAST TECHNIQUE: Multiplanar, multiecho pulse sequences of the brain and  surrounding structures were obtained without intravenous contrast. Angiographic images of the head were obtained using MRA technique without contrast. COMPARISON:  02/06/2017 CT of the head and CTA of the head. FINDINGS: MRI HEAD FINDINGS Brain: Reduced diffusion in the right inferior cerebellum. Associated T2 hyperintense signal abnormality and mild local mass effect compatible with late acute/ early subacute infarction. Unsure diffusion signal abnormality in the brain. No abnormal susceptibility hypointensity to indicate intracranial hemorrhage. No hydrocephalus or effacement of basilar cisterns. Background of few scattered nonspecific foci of T2 FLAIR hyperintense signal abnormality in subcortical and periventricular white matter is compatible with mild chronic microvascular ischemic changes for age. Mild brain parenchymal volume loss. Vascular: As below. Skull and upper cervical spine: Normal marrow signal. Sinuses/Orbits: Mild right maxillary sinus mucosal thickening. Bilateral intra-ocular lens replacement. No abnormal signal of mastoid air cells Other: None. MRA HEAD FINDINGS Internal carotid arteries: Patent left internal carotid artery. Mild irregularity of the cavernous segment without stenosis compatible with intracranial atherosclerosis. The right internal carotid artery is occluded to the paraclinoid segment where it is reconstituted to the terminus. Anterior cerebral arteries:  Patent. Middle cerebral arteries: Patent. Anterior communicating artery: Patent. Posterior communicating arteries: Large right and possible diminutive left posterior communicating arteries. Posterior cerebral arteries:  Patent. Basilar artery:  Patent. Vertebral arteries:  Patent. No evidence of high-grade stenosis, large vessel occlusion, or aneurysm unless noted above. IMPRESSION: 1. Right inferior cerebellum late acute/early subacute infarction stable in distribution in comparison with prior CT of the head given differences  in technique. No acute hemorrhage. 2. Stable background of mild chronic microvascular ischemic changes and mild parenchymal volume loss of the brain. 3. Right ICA occlusion with reconstitution at the paraclinoid segment to the terminus. Large right posterior communicating artery. 4. Patent circle of Willis. No large vessel occlusion, aneurysm, or significant stenosis is identified. Electronically Signed   By: Kristine Garbe M.D.   On: 02/06/2017 19:40   Dg Swallowing Func-speech Pathology  Result Date: 02/07/2017 Objective Swallowing Evaluation: Type of Study: MBS-Modified Barium Swallow Study Patient Details Name: JT BRABEC MRN: [id number] Date of Birth: 1932/02/24 Today's Date: 02/07/2017 Time: SLP Start Time (ACUTE ONLY): 0819-SLP Stop Time (ACUTE ONLY): 0845 SLP Time Calculation (min) (ACUTE ONLY): 26 min Past Medical History: Past Medical History: Diagnosis Date . 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 Carotid artery occlusion  . COPD (chronic obstructive pulmonary disease) ([place])  . Fall due to ice or snow Feb. 22, 2015  Partial Right Hip replaced . History of ITP  . Hx of colonic polyps  . Hyperlipemia  . Osteopenia   ? [place] Kitchen Osteoporosis   dexa 06, declines further dexa or tx  . Smoker  Past Surgical History: Past Surgical History: Procedure Laterality Date . BONE MARROW BIOPSY  05/2003 . CATARACT EXTRACTION   . Dexa    per patient ? osteopenia . HEMORRHOID SURGERY   . JOINT REPLACEMENT Right Feb. 22, 2015  Partial Hip . SPLENECTOMY   . TONSILLECTOMY AND ADENOIDECTOMY Bilateral age 46 per patient HPI: Mr.  Hanawalt is an 83 y.o.male with h/o HLD, vocal cord cancer, tobacco use and COPDwho presented to the ED today for evaluation of new onset dizziness and incoordination. Symptoms also included difficulty speaking, headache, nausea and vomiting. He was found by his wife on the floor beside vomit and a coffee pot he knocked down. Initially pt was unable to speak, but eventually he  was able to state that he was hit with "a bout of dizziness. He has vomited three times since Sunday.   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  Swallow evaluation ordered, given h/o vocal fold cancer s/p XRT, SLP to proceed with MBS.  Subjective: pt awake in chair Assessment / Plan / Recommendation CHL IP CLINICAL IMPRESSIONS 02/07/2017 Clinical Impression Baseline coughing and expectorating secretions noted.  Pt presents with moderate pharyngeal dysphagia - suspect exacerbation of baseline deficits *known h/o dysphagia from vocal cord cancer s/p XRT.  Decreased pharyngeal contraction/tongue base retraction and epiglottic deflection results in residuals (vallecular worse than pyriform sinus) without pt consistent awareness.   Cued dry swallow helpful to decrease residuals that pt does NOT sense and following solid with liquid helpful.  Pt suspected to aspirate with consumption of thin via straw *although missed actual swallow on flouro*.  Pt overtly coughed immediately postswallow of thin via straw - with at least laryngeal penetration observed.  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  Cough with backflow of barium into pharynx and vomiting observed - suspect due to cerebellar cva.  NO aspiration of emesis noted.  Recommend start clear liquid diet with strict precautions.  Using teach back, pt was educated to findings/recommendations thoroughly.   SLP Visit Diagnosis Dysphagia, oropharyngeal phase (R13.12) Attention and concentration deficit following -- Frontal lobe and executive function deficit following -- Impact on safety and function Moderate aspiration risk   CHL IP TREATMENT RECOMMENDATION 02/07/2017 Treatment Recommendations F/U MBS in --- days (Comment)   Prognosis 02/07/2017 Prognosis for Safe Diet Advancement Fair Barriers to Reach Goals Severity of deficits Barriers/Prognosis Comment -- CHL IP DIET RECOMMENDATION 02/07/2017 SLP Diet Recommendations Thin liquid  Liquid Administration via Cup;No straw Medication Administration Other (Comment) Compensations Small sips/bites;Slow rate;Follow solids with liquid;Multiple dry swallows after each bite/sip;Other (Comment) Postural Changes Seated upright at 90 degrees;Remain semi-upright after after feeds/meals (Comment)   CHL IP OTHER RECOMMENDATIONS 02/07/2017 Recommended Consults -- Oral Care Recommendations Oral care before and after PO Other Recommendations --   CHL IP FOLLOW UP RECOMMENDATIONS 02/07/2017 Follow up Recommendations None   CHL IP FREQUENCY AND DURATION 02/07/2017 Speech Therapy Frequency (ACUTE ONLY) min 2x/week Treatment Duration 2 weeks      CHL IP ORAL PHASE 02/07/2017 Oral Phase Impaired Oral - Pudding Teaspoon -- Oral - Pudding Cup -- Oral - Honey Teaspoon -- Oral - Honey Cup -- Oral - Nectar Teaspoon -- Oral - Nectar Cup -- Oral - Nectar Straw -- Oral - Thin Teaspoon WFL Oral - Thin Cup WFL Oral - Thin Straw Premature spillage Oral - Puree WFL Oral - Mech Soft WFL Oral - Regular -- Oral - Multi-Consistency -- Oral - Pill -- Oral Phase - Comment --  CHL IP PHARYNGEAL PHASE 02/07/2017 Pharyngeal Phase Impaired Pharyngeal- Pudding Teaspoon -- Pharyngeal -- Pharyngeal- Pudding Cup -- Pharyngeal -- Pharyngeal- Honey Teaspoon -- Pharyngeal -- Pharyngeal- Honey Cup -- Pharyngeal -- Pharyngeal- Nectar Teaspoon Reduced pharyngeal peristalsis;Reduced epiglottic inversion;Reduced anterior laryngeal mobility;Reduced laryngeal elevation;Reduced airway/laryngeal closure;Reduced tongue base retraction;Pharyngeal residue - valleculae;Pharyngeal residue - pyriform Pharyngeal --  Pharyngeal- Nectar Cup Reduced anterior laryngeal mobility;Reduced laryngeal elevation;Reduced epiglottic inversion;Reduced pharyngeal peristalsis;Reduced tongue base retraction;Pharyngeal residue - valleculae;Pharyngeal residue - pyriform Pharyngeal -- Pharyngeal- Nectar Straw -- Pharyngeal -- Pharyngeal- Thin Teaspoon Reduced pharyngeal  peristalsis;Reduced epiglottic inversion;Reduced anterior laryngeal mobility;Reduced laryngeal elevation;Reduced airway/laryngeal closure;Reduced tongue base retraction;Pharyngeal residue - valleculae;Pharyngeal residue - pyriform Pharyngeal -- Pharyngeal- Thin Cup Reduced pharyngeal peristalsis;Reduced epiglottic inversion;Reduced anterior laryngeal mobility;Reduced laryngeal elevation;Reduced airway/laryngeal closure;Reduced tongue base retraction;Pharyngeal residue - valleculae;Pharyngeal residue - pyriform Pharyngeal -- Pharyngeal- Thin Straw Reduced pharyngeal peristalsis;Reduced epiglottic inversion;Reduced anterior laryngeal mobility;Reduced laryngeal elevation;Reduced airway/laryngeal closure;Reduced tongue base retraction;Penetration/Aspiration during swallow Pharyngeal Material enters airway, passes BELOW cords and not ejected out despite cough attempt by patient Pharyngeal- Puree Reduced pharyngeal peristalsis;Reduced epiglottic inversion;Reduced tongue base retraction;Pharyngeal residue - valleculae Pharyngeal -- Pharyngeal- Mechanical Soft Reduced pharyngeal peristalsis;Reduced epiglottic inversion;Reduced tongue base retraction;Pharyngeal residue - valleculae Pharyngeal -- Pharyngeal- Regular -- Pharyngeal -- Pharyngeal- Multi-consistency -- Pharyngeal -- Pharyngeal- Pill -- Pharyngeal -- Pharyngeal Comment CUED DRY SWALLOWS helpful to decrease residuals, "hock" to clear helpful but not fully effective  CHL IP CERVICAL ESOPHAGEAL PHASE 02/07/2017 Cervical Esophageal Phase Impaired Pudding Teaspoon -- Pudding Cup -- Honey Teaspoon -- Honey Cup -- Nectar Teaspoon -- Nectar Cup -- Nectar Straw -- Thin Teaspoon -- Thin Cup -- Thin Straw -- Puree -- Mechanical Soft -- Regular -- Multi-consistency -- Pill -- Cervical Esophageal Comment pt with strong coughing noted resulting in backflow of barium into pharynx and caused vomiting, upon esophageal sweep at end of MBS - pt appeared with clear esophagus No  flowsheet data found. [name] 02/07/2017, 10:07 AM  Luanna Salk, MS [facility] SLP [phone]             Mr Jodene Nam Head Wo Contrast  Result Date: 02/06/2017 CLINICAL DATA:  83 y/o  M; follow-up of stroke. EXAM: MRI HEAD WITHOUT CONTRAST MRA HEAD WITHOUT CONTRAST TECHNIQUE: Multiplanar, multiecho pulse sequences of the brain and surrounding structures were obtained without intravenous contrast. Angiographic images of the head were obtained using MRA technique without contrast. COMPARISON:  02/06/2017 CT of the head and CTA of the head. FINDINGS: MRI HEAD FINDINGS Brain: Reduced diffusion in the right inferior cerebellum. Associated T2 hyperintense signal abnormality and mild local mass effect compatible with late acute/ early subacute infarction. Unsure diffusion signal abnormality in the brain. No abnormal susceptibility hypointensity to indicate intracranial hemorrhage. No hydrocephalus or effacement of basilar cisterns. Background of few scattered nonspecific foci of T2 FLAIR hyperintense signal abnormality in subcortical and periventricular white matter is compatible with mild chronic microvascular ischemic changes for age. Mild brain parenchymal volume loss. Vascular: As below. Skull and upper cervical spine: Normal marrow signal. Sinuses/Orbits: Mild right maxillary sinus mucosal thickening. Bilateral intra-ocular lens replacement. No abnormal signal of mastoid air cells Other: None. MRA HEAD FINDINGS Internal carotid arteries: Patent left internal carotid artery. Mild irregularity of the cavernous segment without stenosis compatible with intracranial atherosclerosis. The right internal carotid artery is occluded to the paraclinoid segment where it is reconstituted to the terminus. Anterior cerebral arteries:  Patent. Middle cerebral arteries: Patent. Anterior communicating artery: Patent. Posterior communicating arteries: Large right and possible diminutive left posterior communicating arteries.  Posterior cerebral arteries:  Patent. Basilar artery:  Patent. Vertebral arteries:  Patent. No evidence of high-grade stenosis, large vessel occlusion, or aneurysm unless noted above. IMPRESSION: 1. Right inferior cerebellum late acute/early subacute infarction stable in distribution in comparison with prior CT of the head given differences in technique. No acute hemorrhage. 2. Stable  background of mild chronic microvascular ischemic changes and mild parenchymal volume loss of the brain. 3. Right ICA occlusion with reconstitution at the paraclinoid segment to the terminus. Large right posterior communicating artery. 4. Patent circle of Willis. No large vessel occlusion, aneurysm, or significant stenosis is identified. Electronically Signed   By: Kristine Garbe M.D.   On: 02/06/2017 19:40    Cardiac Studies   2d echo 02/06/17 Study Conclusions  - Left ventricle: The cavity size was normal. Wall thickness was   normal. Systolic function was vigorous. The estimated ejection   fraction was in the range of 65% to 70%. - Aortic valve: Moderately calcified annulus. Mildly thickened   leaflets. - Mitral valve: There was mild regurgitation.  Patient Profile     83 y.o. male with throat cancer, carotid artery disease, COPD, ITP s/p remote splenectomy, tobacco abuse admitted with acute stroke, cardiology following for newly recognized atrial fib.  Assessment & Plan    1. Stroke with cerebrovascular disease - per neuro. Crestor started for lipids. If the patient is tolerating statin at time of follow-up appointment, would consider rechecking liver function/lipid panel in 6-8 weeks. BP management per primary team.  2. Newly recognized atrial fib - asymptomatic, no prior EKGs, duration unknown. Rate is controlled without any agents on board so I suspect this has been at least persistent. 2D echo is generally benign. Neurology has added Plavix this AM for unclear reasons. Not sure if this is due to  prior h/o ITP. Patient's wife reports no interim issues for many many years. No prior h/o significant bleeding. Neuro's note is not up yet to discuss why Plavix over apixaban, will await their decision-making regarding the issue. If he is felt to require Plavix over apixaban, would plan rate control strategy but if we are able to fully anticoagulate, would consider OP DCCV after at least 3 weeks of anticoagulation.  3. Mild mitral regurgitation - not currently significant, will follow clinically.  Signed, Melina Copa PA-C (pager [phone]) 02/07/2017, 11:47 AM    The patient was seen, examined and discussed with Melina Copa, PA-C and I agree with the above.   1. New onset atrial fibrillation - CHA2DS2/VAS Stroke Risk Points  31 (age, stroke). He was just admitted with a large stroke with possible hemorraghic conversion, anticoagulation should be initiated once ok with neurology. He is rate controlled, I would not attempt cardioversion in the settings of an acute stroke, we can plan once 4 weeks on anticoagulation. His echo shows normal LVEF, mild MR and  Normal left atrial size. 2. Stroke - followed by neurology, they recommended a week of Plavix then switching to Eliquis as his risk of hemorrhagic conversion is high. 3. BP and lipids are at goal. However in the settings of an acute stroke with known plaque I would recommend to start statin such as rosuvastatin 10 mg po daily.   We will sign off, please call us when the patient is being discharged so we can schedule an outpatient follow up.  Ena Dawley, MD 02/07/2017

## 2017-02-07 NOTE — Progress Notes (Addendum)
  Speech Language Pathology Treatment: Dysphagia  Patient Details Name: Daniel Chapman MRN: [id number] DOB: 1931-10-26 Today's Date: 02/07/2017 Time: 5465-0354 SLP Time Calculation (min) (ACUTE ONLY): 23 min  Assessment / Plan / Recommendation Clinical Impression  SLP visit to educate pt regarding his dysphagia, indication for swallow precautions, aspiration pneumonia risk given his sensory deficits from vocal cord XRT possibly decreasing compliance.   SLP provided instructions verbally using teach back and in writing. He benefited from multiple repetitions of directions to comprehend - use of diagram helpful in solidifying understanding.  Pt initially required moderate cues to comprehend and state fading to modified independence at end of session. Pt read precautions aloud and verbalized reasoning for compensation strategies.    Pt demonstrated use of oral suction to this SLP.  He will require strict precautions and close monitoring of tolerance.     HPI HPI: Daniel Chapman is an 83 y.o.male with h/o HLD, vocal cord cancer, tobacco use and COPDwho presented to the ED today for evaluation of new onset dizziness and incoordination. Symptoms also included difficulty speaking, headache, nausea and vomiting. He was found by his wife on the floor beside vomit and a coffee pot he knocked down. Initially pt was unable to speak, but eventually he was able to state that he was hit with "a bout of dizziness. He has vomited three times since Sunday.   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  Swallow evaluation ordered, given h/o vocal fold cancer s/p XRT, SLP to proceed with MBS.       SLP Plan  Continue with current plan of care       Recommendations  Diet recommendations: Thin liquid (clears at this time, rec advance to full liquids when able) Liquids provided via: No straw;Cup Medication Administration: Crushed with puree (or liquid form) Supervision: Patient able to self feed Compensations: Small  sips/bites;Slow rate;Follow solids with liquid;Multiple dry swallows after each bite/sip;Other (Comment);Effortful swallow (cough/hock to clear residuals) Postural Changes and/or Swallow Maneuvers: Seated upright 90 degrees;Upright 30-60 min after meal                Oral Care Recommendations: Oral care QID Follow up Recommendations: Home health SLP SLP Visit Diagnosis: Dysphagia, oropharyngeal phase (R13.12) Plan: Continue with current plan of care       GO               Daniel Chapman, [facility] SLP [phone]  Daniel Chapman 02/07/2017, 10:14 AM

## 2017-02-07 NOTE — Progress Notes (Signed)
PROGRESS NOTE    Daniel Chapman  NGE:[id number] DOB: 02/01/1932 DOA: 02/06/2017 PCP: Abner Greenspan, MD   Chief Complaint  Patient presents with  . Dizziness    Brief Narrative:  HPI on 02/06/2017 by Ms. [name], NP Daniel Chapman is a 83 y.o. male with medical history significant for COPD not on home oxygen, tobacco use, vocal cord cancer, ITP, hyperlipidemia, carotid artery occlusion presents to emergency Department chief complaint dizziness fall yesterday. Initial evaluation reveals afib new onset and includes a CT of the head revealing Subacute and completed infarction of the right posterior inferior cerebellar territory. No intracranial hemorrhage, with minimal local mass effect secondary to edema.    Information is obtained from the patient and his wife is at the bedside. Patient is unable to articulate factly what happened yesterday. He can say that he became dizzy and fell down. He then got himself up to the chair. He does not remember vomiting but wife says when she came home from the grocery store she found him sitting in the chair with emesis on his shirt. In addition she found the house" disarray". Patient denies any headache visual disturbances numbness tingling of extremities. He denies chest pain palpitations shortness of breath. He denies nausea and does not remember vomiting. He denies diaphoresis lower extremity edema. He denies any fever chills recent travel or sick contacts. He denies dysuria hematuria frequency or urgency. He denies diarrhea constipation melena bright red blood per rectum. Wife states he appeared to have difficulty talking initially but that improved as the evening wore on. He slept through the night without any problems. She called the PCP this morning to report what happened and they recommend she come to the emergency department Assessment & Plan   Acute CVA -CT head: Subacute and complicated infarction of the right posterior inferior cerebellar  territory. No intracranial hemorrhage, with minimal local mass effect secondary to edema -MRI/MRA: Right inferior cerebellum with late acute/early subacute infarction stable in distribution in comparison to prior CT hemorrhage. Right ICA occlusion with reconstitution at the paraclinoid segment -Echocardiogram EF 65-70% -Carotid Doppler pending  -LDL 96, hemoglobin A1c 6.2 -Speech consulted and recommended clear liquid diet and home health speech -PT/OT pending  -Aspirin held as patient has history of ITP and urinary to aspirin -Continue Plavix, statin -Neurology consultation appreciated  Atrial fibrillation, new onset -Discussed with Dr. Erlinda Hong, neurology. 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 Dispo TBD  Consultants Neurology Cardiology  Procedures  Echocardiogram  Antibiotics   Anti-infectives    None      Subjective:   Sarina Ser seen and examined today.  Patient has no complaints. Denies chest pain, shortness of breath, abdominal pain, nausea, vomiting, diarrhea, constipation.    Objective:   Vitals:   02/07/17 0100 02/07/17 0300 02/07/17 0554 02/07/17 0932  BP: 125/82 128/87 (!) 148/81 (!) 147/92  Pulse: 70 77 85 69  Resp: '18 18 18 20  '$ Temp: 97.7 F (36.5 C) 97.7 F (36.5 C) 97.7 F (36.5 C) 97.6 F (36.4 C)  TempSrc: Oral Oral Oral Oral  SpO2: 96% 98% 98% 97%  Weight:  Height:        Intake/Output Summary (Last 24 hours) at 02/07/17 1343 Last data filed at 02/07/17 0300  Gross per 24 hour  Intake           511.67 ml  Output               200 ml  Net           311.67 ml   Filed Weights   02/06/17 0940 02/06/17 1531  Weight: 56.2 kg (124 lb) 56.6 kg (124 lb 11.2 oz)    Exam  General: Well developed, well nourished, NAD, appears stated age  27: NCAT, mucous membranes moist. Poor dentition  Cardiovascular: S1 S2 auscultated, 2/6, irregular  Respiratory: Clear to auscultation bilaterally with equal chest rise  Abdomen: Soft, nontender, nondistended, + bowel sounds  Extremities: warm dry without cyanosis clubbing or edema  Neuro: AAOx3, hard of hearing, otherwise nonfocal  Data Reviewed: I have personally reviewed following labs and imaging studies  CBC:  Recent Labs Lab 02/06/17 0947 02/06/17 1009  WBC 9.9  --   NEUTROABS 7.2  --   HGB 13.0 13.9  HCT 40.4 41.0  MCV 90.4  --   PLT 224  --    Basic Metabolic Panel:  Recent Labs Lab 02/06/17 0947 02/06/17 1009  NA 137 138  K 4.1 4.1  CL 104 103  CO2 24  --   GLUCOSE 129* 128*  BUN 14 18  CREATININE 0.89 0.80  CALCIUM 9.3  --    GFR: Estimated Creatinine Clearance: 56 mL/min (by C-G formula based on SCr of 0.8 mg/dL). Liver Function Tests:  Recent Labs Lab 02/06/17 0947  AST 29  ALT 17  ALKPHOS 52  BILITOT 1.0  PROT 7.4  ALBUMIN 3.7   No results for input(s): LIPASE, AMYLASE in the last 168 hours. No results for input(s): AMMONIA in the last 168 hours. Coagulation Profile:  Recent Labs Lab 02/06/17 0947  INR 1.03   Cardiac Enzymes: No results for input(s): CKTOTAL, CKMB, CKMBINDEX, TROPONINI in the last 168 hours. BNP (last 3 results) No results for input(s): PROBNP in the last 8760 hours. HbA1C:  Recent Labs  02/06/17 0947  HGBA1C 6.2*   CBG:  Recent Labs Lab 02/06/17 1004  GLUCAP 123*   Lipid Profile:  Recent Labs  02/06/17 0947  CHOL 148  HDL 29*  LDLCALC 96  TRIG 116  CHOLHDL 5.1   Thyroid Function Tests:  Recent Labs  02/06/17 1522  TSH 0.722   Anemia Panel: No results for input(s):  VITAMINB12, FOLATE, FERRITIN, TIBC, IRON, RETICCTPCT in the last 72 hours. Urine analysis: No results found for: COLORURINE, APPEARANCEUR, LABSPEC, PHURINE, GLUCOSEU, HGBUR, BILIRUBINUR, KETONESUR, PROTEINUR, UROBILINOGEN, NITRITE, LEUKOCYTESUR Sepsis Labs: '@LABRCNTIP'$ (procalcitonin:4,lacticidven:4)  )No results found for this or any previous visit (from the past 240 hour(s)).    Radiology Studies: Ct Angio Head W Or Wo Contrast  Result Date: 02/06/2017 CLINICAL DATA:  Acute right cerebellar infarction. EXAM: CT ANGIOGRAPHY HEAD AND NECK TECHNIQUE: Multidetector CT imaging of the head and neck was performed using the standard protocol during bolus administration of intravenous contrast. Multiplanar CT image reconstructions and MIPs were obtained to evaluate the vascular anatomy. Carotid stenosis measurements (when applicable) are obtained utilizing NASCET criteria, using the distal internal carotid diameter as the denominator. CONTRAST:  50 cc Isovue 370 COMPARISON:  Head CT same day FINDINGS: CTA NECK FINDINGS Aortic arch: Aortic atherosclerosis. No aneurysm or dissection. Branching pattern of the brachiocephalic vessels  from the arch is normal. Atherosclerotic disease at the brachiocephalic vessel origins but without stenosis greater than about 20%. Right carotid system: Common carotid artery shows atherosclerotic disease with narrowing in the mid portion of 40%. Occlusion of the right ICA at the origin. No antegrade flow in the ICA at the skullbase. Left carotid system: Common carotid artery widely patent to the bifurcation region. Motion degradation at the bifurcation. Streak artifact at the bifurcation. Atherosclerotic disease with soft and calcified plaque. Stenosis of the proximal ICA estimated at 50%. Motion degradation in the cervical ICA region without suspicion of flow limiting stenosis in that region. Vertebral arteries: Right vertebral artery origin widely patent. No subclavian stenosis  proximal or distal to that. Right vertebral artery is widely patent through the cervical region to the foramen magnum. Left subclavian artery shows a focal way lobe or localized dissection just proximal to the left vertebral artery origin. There is severe stenosis at the left vertebral artery origin, 80% or greater. The vessel is patent beyond [facility] through the cervical region to the foramen magnum. Skeleton: Ordinary mid cervical spondylosis. Other neck: No mass or lymphadenopathy. Benign appearing sebaceous cyst of the right lower neck. Upper chest: Extensive upper lobe emphysema with pleural and parenchymal scarring. Regions of scarring appear masslike and are progressive since the most recent chest CT of 05/05/2010. Depending on the clinical status, complete chest CT would be suggested if the patient is a candidate for treatment of pulmonary disease. Review of the MIP images confirms the above findings CTA HEAD FINDINGS Anterior circulation: Left internal carotid artery is patent through the skullbase and siphon regions. There is some external to internal collateral flow with reconstitution of the distal siphon and supraclinoid ICA on the right. There are also patent anterior and posterior communicating arteries. No ACA or MCA stenosis. No aneurysm or vascular malformation. Posterior circulation: Both vertebral arteries are patent at the foramen magnum level. Flow is present in both posterior inferior cerebellar artery is presently. Both vertebral artery supply the basilar. No basilar stenosis. Left anterior inferior cerebellar artery, superior cerebellar arteries and posterior cerebral arteries are patent bilaterally without evidence of flow limiting stenosis or missing components. It is not possible to determine if the right anterior inferior cerebellar artery is occluded or absent developmentally. Venous sinuses: Patent and normal Anatomic variants: None other significant Delayed phase: No abnormal enhancement  Review of the MIP images confirms the above findings IMPRESSION: Advanced atherosclerotic disease of the thoracic aorta but without evidence of aneurysm or dissection. Atherosclerotic disease at the brachiocephalic vessel origins but without stenosis greater than 20%. 40% stenosis in the midportion of the right common carotid artery. Complete occlusion of the right internal carotid artery at its origin. Atherosclerotic disease at the left carotid bifurcation. Detail is limited by artifact and motion. Stenosis is estimated at 50% at the proximal ICA. Right vertebral artery widely patent through the neck. Web or focal dissection of the left subclavian artery just proximal to the left vertebral artery origin. Severe stenosis of the left vertebral artery origin, 80% or greater. The vessel is widely patent beyond that. Both posterior inferior cerebellar arteries are patent presently. No visible anterior inferior cerebellar artery on the right. This could be occluded or congenitally absent. Right ICA reconstitution in the supraclinoid region secondary to external to internal collaterals, patent anterior communicating artery and patent posterior communicating artery. Parenchymal distributions of the anterior and middle cerebral vessels appear intact. Advanced chronic lung disease with advanced emphysema. Pleural and parenchymal scarring  at both apices, with increased density compared to the CT chest of 2011. I cannot rule out the possibility of tumor in this region, though this may merely be progressive scarring. If the patient is a candidate for treatment pulmonary disease, complete chest CT would be warranted. Electronically Signed   By: Nelson Chimes M.D.   On: 02/06/2017 14:33   Dg Chest 2 View  Result Date: 02/06/2017 CLINICAL DATA:  Stroke.  No chest complaints. EXAM: CHEST  2 VIEW COMPARISON:  07/19/2011 FINDINGS: Hyperinflation. Lateral view degraded by patient arm position. Midthoracic compression deformity is  mild to moderate and similar. Numerous leads and wires project over the chest. Midline trachea. Normal heart size and mediastinal contours for age. Left costophrenic angle excluded from the frontal radiograph. No pleural effusion or pneumothorax. Biapical pleural-parenchymal scarring. Diffuse peribronchial thickening. IMPRESSION: 1.  No acute cardiopulmonary disease. 2. COPD/chronic bronchitis. Pleural-parenchymal scarring at the apices. Electronically Signed   By: Abigail Miyamoto M.D.   On: 02/06/2017 12:42   Ct Head Wo Contrast  Result Date: 02/06/2017 CLINICAL DATA:  83 year old male with a history of dizziness EXAM: CT HEAD WITHOUT CONTRAST TECHNIQUE: Contiguous axial images were obtained from the base of the skull through the vertex without intravenous contrast. COMPARISON:  None. FINDINGS: Brain: Confluent hypodense territory involving the inferior and medial right cerebellar hemisphere. No acute intracranial hemorrhage. Minimal mass effect resulting in slight narrowing of the fourth ventricle. Unremarkable configuration of the ventricles. No supratentorial hemorrhage, midline shift, mass effect. Gray-white differentiation of the anterior circulation maintained. Vascular: Calcifications of intracranial vasculature. Sagittal reformatted images demonstrate questionable hyperdensity of the vertebrobasilar junction/inferior basilar artery. Skull: No acute bony abnormality.  No aggressive bone lesion. Sinuses/Orbits: No significant atherosclerotic changes. Other: None IMPRESSION: Subacute and completed infarction of the right posterior inferior cerebellar territory. No intracranial hemorrhage, with minimal local mass effect secondary to edema. Sagittal reformatted images demonstrate questionable hyperdensity of the vertebrobasilar junction and the inferior basilar artery, potentially representing thrombus. CT angiogram may be useful. Electronically Signed   By: Corrie Mckusick D.O.   On: 02/06/2017 11:07   Ct  Angio Neck W Or Wo Contrast  Result Date: 02/06/2017 CLINICAL DATA:  Acute right cerebellar infarction. EXAM: CT ANGIOGRAPHY HEAD AND NECK TECHNIQUE: Multidetector CT imaging of the head and neck was performed using the standard protocol during bolus administration of intravenous contrast. Multiplanar CT image reconstructions and MIPs were obtained to evaluate the vascular anatomy. Carotid stenosis measurements (when applicable) are obtained utilizing NASCET criteria, using the distal internal carotid diameter as the denominator. CONTRAST:  50 cc Isovue 370 COMPARISON:  Head CT same day FINDINGS: CTA NECK FINDINGS Aortic arch: Aortic atherosclerosis. No aneurysm or dissection. Branching pattern of the brachiocephalic vessels from the arch is normal. Atherosclerotic disease at the brachiocephalic vessel origins but without stenosis greater than about 20%. Right carotid system: Common carotid artery shows atherosclerotic disease with narrowing in the mid portion of 40%. Occlusion of the right ICA at the origin. No antegrade flow in the ICA at the skullbase. Left carotid system: Common carotid artery widely patent to the bifurcation region. Motion degradation at the bifurcation. Streak artifact at the bifurcation. Atherosclerotic disease with soft and calcified plaque. Stenosis of the proximal ICA estimated at 50%. Motion degradation in the cervical ICA region without suspicion of flow limiting stenosis in that region. Vertebral arteries: Right vertebral artery origin widely patent. No subclavian stenosis proximal or distal to that. Right vertebral artery is widely patent through the cervical  region to the foramen magnum. Left subclavian artery shows a focal way lobe or localized dissection just proximal to the left vertebral artery origin. There is severe stenosis at the left vertebral artery origin, 80% or greater. The vessel is patent beyond [facility] through the cervical region to the foramen magnum. Skeleton:  Ordinary mid cervical spondylosis. Other neck: No mass or lymphadenopathy. Benign appearing sebaceous cyst of the right lower neck. Upper chest: Extensive upper lobe emphysema with pleural and parenchymal scarring. Regions of scarring appear masslike and are progressive since the most recent chest CT of 05/05/2010. Depending on the clinical status, complete chest CT would be suggested if the patient is a candidate for treatment of pulmonary disease. Review of the MIP images confirms the above findings CTA HEAD FINDINGS Anterior circulation: Left internal carotid artery is patent through the skullbase and siphon regions. There is some external to internal collateral flow with reconstitution of the distal siphon and supraclinoid ICA on the right. There are also patent anterior and posterior communicating arteries. No ACA or MCA stenosis. No aneurysm or vascular malformation. Posterior circulation: Both vertebral arteries are patent at the foramen magnum level. Flow is present in both posterior inferior cerebellar artery is presently. Both vertebral artery supply the basilar. No basilar stenosis. Left anterior inferior cerebellar artery, superior cerebellar arteries and posterior cerebral arteries are patent bilaterally without evidence of flow limiting stenosis or missing components. It is not possible to determine if the right anterior inferior cerebellar artery is occluded or absent developmentally. Venous sinuses: Patent and normal Anatomic variants: None other significant Delayed phase: No abnormal enhancement Review of the MIP images confirms the above findings IMPRESSION: Advanced atherosclerotic disease of the thoracic aorta but without evidence of aneurysm or dissection. Atherosclerotic disease at the brachiocephalic vessel origins but without stenosis greater than 20%. 40% stenosis in the midportion of the right common carotid artery. Complete occlusion of the right internal carotid artery at its origin.  Atherosclerotic disease at the left carotid bifurcation. Detail is limited by artifact and motion. Stenosis is estimated at 50% at the proximal ICA. Right vertebral artery widely patent through the neck. Web or focal dissection of the left subclavian artery just proximal to the left vertebral artery origin. Severe stenosis of the left vertebral artery origin, 80% or greater. The vessel is widely patent beyond that. Both posterior inferior cerebellar arteries are patent presently. No visible anterior inferior cerebellar artery on the right. This could be occluded or congenitally absent. Right ICA reconstitution in the supraclinoid region secondary to external to internal collaterals, patent anterior communicating artery and patent posterior communicating artery. Parenchymal distributions of the anterior and middle cerebral vessels appear intact. Advanced chronic lung disease with advanced emphysema. Pleural and parenchymal scarring at both apices, with increased density compared to the CT chest of 2011. I cannot rule out the possibility of tumor in this region, though this may merely be progressive scarring. If the patient is a candidate for treatment pulmonary disease, complete chest CT would be warranted. Electronically Signed   By: Nelson Chimes M.D.   On: 02/06/2017 14:33   Daniel Brain Wo Contrast  Result Date: 02/06/2017 CLINICAL DATA:  83 y/o  M; follow-up of stroke. EXAM: MRI HEAD WITHOUT CONTRAST MRA HEAD WITHOUT CONTRAST TECHNIQUE: Multiplanar, multiecho pulse sequences of the brain and surrounding structures were obtained without intravenous contrast. Angiographic images of the head were obtained using MRA technique without contrast. COMPARISON:  02/06/2017 CT of the head and CTA of the head. FINDINGS:  MRI HEAD FINDINGS Brain: Reduced diffusion in the right inferior cerebellum. Associated T2 hyperintense signal abnormality and mild local mass effect compatible with late acute/ early subacute infarction.  Unsure diffusion signal abnormality in the brain. No abnormal susceptibility hypointensity to indicate intracranial hemorrhage. No hydrocephalus or effacement of basilar cisterns. Background of few scattered nonspecific foci of T2 FLAIR hyperintense signal abnormality in subcortical and periventricular white matter is compatible with mild chronic microvascular ischemic changes for age. Mild brain parenchymal volume loss. Vascular: As below. Skull and upper cervical spine: Normal marrow signal. Sinuses/Orbits: Mild right maxillary sinus mucosal thickening. Bilateral intra-ocular lens replacement. No abnormal signal of mastoid air cells Other: None. MRA HEAD FINDINGS Internal carotid arteries: Patent left internal carotid artery. Mild irregularity of the cavernous segment without stenosis compatible with intracranial atherosclerosis. The right internal carotid artery is occluded to the paraclinoid segment where it is reconstituted to the terminus. Anterior cerebral arteries:  Patent. Middle cerebral arteries: Patent. Anterior communicating artery: Patent. Posterior communicating arteries: Large right and possible diminutive left posterior communicating arteries. Posterior cerebral arteries:  Patent. Basilar artery:  Patent. Vertebral arteries:  Patent. No evidence of high-grade stenosis, large vessel occlusion, or aneurysm unless noted above. IMPRESSION: 1. Right inferior cerebellum late acute/early subacute infarction stable in distribution in comparison with prior CT of the head given differences in technique. No acute hemorrhage. 2. Stable background of mild chronic microvascular ischemic changes and mild parenchymal volume loss of the brain. 3. Right ICA occlusion with reconstitution at the paraclinoid segment to the terminus. Large right posterior communicating artery. 4. Patent circle of Willis. No large vessel occlusion, aneurysm, or significant stenosis is identified. Electronically Signed   By: Kristine Garbe M.D.   On: 02/06/2017 19:40   Dg Swallowing Func-speech Pathology  Result Date: 02/07/2017 Objective Swallowing Evaluation: Type of Study: MBS-Modified Barium Swallow Study Patient Details Name: NORVIL MARTENSEN MRN: [id number] Date of Birth: [birth year] Today's Date: 02/07/2017 Time: SLP Start Time (ACUTE ONLY): 0819-SLP Stop Time (ACUTE ONLY): 0845 SLP Time Calculation (min) (ACUTE ONLY): 26 min Past Medical History: Past Medical History: Diagnosis Date . Carcinoma of vocal cord [facility]) 2008  radiation therapy . Carotid artery occlusion  . COPD (chronic obstructive pulmonary disease) ([place])  . Fall due to ice or snow Feb. 22, 2015  Partial Right Hip replaced . History of ITP  . Hx of colonic polyps  . Hyperlipemia  . Osteopenia   ? [facility] Kitchen Osteoporosis   dexa 06, declines further dexa or tx  . Smoker  Past Surgical History: Past Surgical History: Procedure Laterality Date . BONE MARROW BIOPSY  05/2003 . CATARACT EXTRACTION   . Dexa    per patient ? osteopenia . HEMORRHOID SURGERY   . JOINT REPLACEMENT Right Feb. 22, 2015  Partial Hip . SPLENECTOMY   . TONSILLECTOMY AND ADENOIDECTOMY Bilateral age 89 per patient HPI: Daniel. Chapman is an 83 y.o.male with h/o HLD, vocal cord cancer, tobacco use and COPDwho presented to the ED today for evaluation of new onset dizziness and incoordination. Symptoms also included difficulty speaking, headache, nausea and vomiting. He was found by his wife on the floor beside vomit and a coffee pot he knocked down. Initially pt was unable to speak, but eventually he was able to state that he was hit with "a bout of dizziness. He has vomited three times since Sunday.   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  Swallow evaluation ordered, given h/o vocal fold cancer s/p XRT, SLP to proceed with  MBS.  Subjective: pt awake in chair Assessment / Plan / Recommendation CHL IP CLINICAL IMPRESSIONS 02/07/2017 Clinical Impression Baseline coughing and expectorating secretions noted.  Pt presents  with moderate pharyngeal dysphagia - suspect exacerbation of baseline deficits *known h/o dysphagia from vocal cord cancer s/p XRT.  Decreased pharyngeal contraction/tongue base retraction and epiglottic deflection results in residuals (vallecular worse than pyriform sinus) without pt consistent awareness.   Cued dry swallow helpful to decrease residuals that pt does NOT sense and following solid with liquid helpful.  Pt suspected to aspirate with consumption of thin via straw *although missed actual swallow on flouro*.  Pt overtly coughed immediately postswallow of thin via straw - with at least laryngeal penetration observed.  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  Cough with backflow of barium into pharynx and vomiting observed - suspect due to cerebellar cva.  NO aspiration of emesis noted.  Recommend start clear liquid diet with strict precautions.  Using teach back, pt was educated to findings/recommendations thoroughly.   SLP Visit Diagnosis Dysphagia, oropharyngeal phase (R13.12) Attention and concentration deficit following -- Frontal lobe and executive function deficit following -- Impact on safety and function Moderate aspiration risk   CHL IP TREATMENT RECOMMENDATION 02/07/2017 Treatment Recommendations F/U MBS in --- days (Comment)   Prognosis 02/07/2017 Prognosis for Safe Diet Advancement Fair Barriers to Reach Goals Severity of deficits Barriers/Prognosis Comment -- CHL IP DIET RECOMMENDATION 02/07/2017 SLP Diet Recommendations Thin liquid Liquid Administration via Cup;No straw Medication Administration Other (Comment) Compensations Small sips/bites;Slow rate;Follow solids with liquid;Multiple dry swallows after each bite/sip;Other (Comment) Postural Changes Seated upright at 90 degrees;Remain semi-upright after after feeds/meals (Comment)   CHL IP OTHER RECOMMENDATIONS 02/07/2017 Recommended Consults -- Oral Care Recommendations Oral care before and  after PO Other Recommendations --   CHL IP FOLLOW UP RECOMMENDATIONS 02/07/2017 Follow up Recommendations None   CHL IP FREQUENCY AND DURATION 02/07/2017 Speech Therapy Frequency (ACUTE ONLY) min 2x/week Treatment Duration 2 weeks      CHL IP ORAL PHASE 02/07/2017 Oral Phase Impaired Oral - Pudding Teaspoon -- Oral - Pudding Cup -- Oral - Honey Teaspoon -- Oral - Honey Cup -- Oral - Nectar Teaspoon -- Oral - Nectar Cup -- Oral - Nectar Straw -- Oral - Thin Teaspoon WFL Oral - Thin Cup WFL Oral - Thin Straw Premature spillage Oral - Puree WFL Oral - Mech Soft WFL Oral - Regular -- Oral - Multi-Consistency -- Oral - Pill -- Oral Phase - Comment --  CHL IP PHARYNGEAL PHASE 02/07/2017 Pharyngeal Phase Impaired Pharyngeal- Pudding Teaspoon -- Pharyngeal -- Pharyngeal- Pudding Cup -- Pharyngeal -- Pharyngeal- Honey Teaspoon -- Pharyngeal -- Pharyngeal- Honey Cup -- Pharyngeal -- Pharyngeal- Nectar Teaspoon Reduced pharyngeal peristalsis;Reduced epiglottic inversion;Reduced anterior laryngeal mobility;Reduced laryngeal elevation;Reduced airway/laryngeal closure;Reduced tongue base retraction;Pharyngeal residue - valleculae;Pharyngeal residue - pyriform Pharyngeal -- Pharyngeal- Nectar Cup Reduced anterior laryngeal mobility;Reduced laryngeal elevation;Reduced epiglottic inversion;Reduced pharyngeal peristalsis;Reduced tongue base retraction;Pharyngeal residue - valleculae;Pharyngeal residue - pyriform Pharyngeal -- Pharyngeal- Nectar Straw -- Pharyngeal -- Pharyngeal- Thin Teaspoon Reduced pharyngeal peristalsis;Reduced epiglottic inversion;Reduced anterior laryngeal mobility;Reduced laryngeal elevation;Reduced airway/laryngeal closure;Reduced tongue base retraction;Pharyngeal residue - valleculae;Pharyngeal residue - pyriform Pharyngeal -- Pharyngeal- Thin Cup Reduced pharyngeal peristalsis;Reduced epiglottic inversion;Reduced anterior laryngeal mobility;Reduced laryngeal elevation;Reduced airway/laryngeal closure;Reduced  tongue base retraction;Pharyngeal residue - valleculae;Pharyngeal residue - pyriform Pharyngeal -- Pharyngeal- Thin Straw Reduced pharyngeal peristalsis;Reduced epiglottic inversion;Reduced anterior laryngeal mobility;Reduced laryngeal elevation;Reduced airway/laryngeal closure;Reduced tongue base retraction;Penetration/Aspiration during swallow Pharyngeal Material enters airway,  passes BELOW cords and not ejected out despite cough attempt by patient Pharyngeal- Puree Reduced pharyngeal peristalsis;Reduced epiglottic inversion;Reduced tongue base retraction;Pharyngeal residue - valleculae Pharyngeal -- Pharyngeal- Mechanical Soft Reduced pharyngeal peristalsis;Reduced epiglottic inversion;Reduced tongue base retraction;Pharyngeal residue - valleculae Pharyngeal -- Pharyngeal- Regular -- Pharyngeal -- Pharyngeal- Multi-consistency -- Pharyngeal -- Pharyngeal- Pill -- Pharyngeal -- Pharyngeal Comment CUED DRY SWALLOWS helpful to decrease residuals, "hock" to clear helpful but not fully effective  CHL IP CERVICAL ESOPHAGEAL PHASE 02/07/2017 Cervical Esophageal Phase Impaired Pudding Teaspoon -- Pudding Cup -- Honey Teaspoon -- Honey Cup -- Nectar Teaspoon -- Nectar Cup -- Nectar Straw -- Thin Teaspoon -- Thin Cup -- Thin Straw -- Puree -- Mechanical Soft -- Regular -- Multi-consistency -- Pill -- Cervical Esophageal Comment pt with strong coughing noted resulting in backflow of barium into pharynx and caused vomiting, upon esophageal sweep at end of MBS - pt appeared with clear esophagus No flowsheet data found. [name] 02/07/2017, 10:07 AM  Daniel Salk, MS [facility] SLP [phone]             Daniel Chapman Head Wo Contrast  Result Date: 02/06/2017 CLINICAL DATA:  83 y/o  M; follow-up of stroke. EXAM: MRI HEAD WITHOUT CONTRAST MRA HEAD WITHOUT CONTRAST TECHNIQUE: Multiplanar, multiecho pulse sequences of the brain and surrounding structures were obtained without intravenous contrast. Angiographic images of the head  were obtained using MRA technique without contrast. COMPARISON:  02/06/2017 CT of the head and CTA of the head. FINDINGS: MRI HEAD FINDINGS Brain: Reduced diffusion in the right inferior cerebellum. Associated T2 hyperintense signal abnormality and mild local mass effect compatible with late acute/ early subacute infarction. Unsure diffusion signal abnormality in the brain. No abnormal susceptibility hypointensity to indicate intracranial hemorrhage. No hydrocephalus or effacement of basilar cisterns. Background of few scattered nonspecific foci of T2 FLAIR hyperintense signal abnormality in subcortical and periventricular white matter is compatible with mild chronic microvascular ischemic changes for age. Mild brain parenchymal volume loss. Vascular: As below. Skull and upper cervical spine: Normal marrow signal. Sinuses/Orbits: Mild right maxillary sinus mucosal thickening. Bilateral intra-ocular lens replacement. No abnormal signal of mastoid air cells Other: None. MRA HEAD FINDINGS Internal carotid arteries: Patent left internal carotid artery. Mild irregularity of the cavernous segment without stenosis compatible with intracranial atherosclerosis. The right internal carotid artery is occluded to the paraclinoid segment where it is reconstituted to the terminus. Anterior cerebral arteries:  Patent. Middle cerebral arteries: Patent. Anterior communicating artery: Patent. Posterior communicating arteries: Large right and possible diminutive left posterior communicating arteries. Posterior cerebral arteries:  Patent. Basilar artery:  Patent. Vertebral arteries:  Patent. No evidence of high-grade stenosis, large vessel occlusion, or aneurysm unless noted above. IMPRESSION: 1. Right inferior cerebellum late acute/early subacute infarction stable in distribution in comparison with prior CT of the head given differences in technique. No acute hemorrhage. 2. Stable background of mild chronic microvascular ischemic  changes and mild parenchymal volume loss of the brain. 3. Right ICA occlusion with reconstitution at the paraclinoid segment to the terminus. Large right posterior communicating artery. 4. Patent circle of Willis. No large vessel occlusion, aneurysm, or significant stenosis is identified. Electronically Signed   By: Kristine Garbe M.D.   On: 02/06/2017 19:40     Scheduled Meds: .  stroke: mapping our early stages of recovery book   Does not apply Once  . clopidogrel  75 mg Oral Daily  . enoxaparin (LOVENOX) injection  40 mg Subcutaneous Q24H  . rosuvastatin  10  mg Oral q1800   Continuous Infusions: . sodium chloride 50 mL/hr at 02/06/17 1646     LOS: 1 day   Time Spent in minutes   30 minutes  Thelbert Gartin D.O. on 02/07/2017 at 1:43 PM  Between 7am to 7pm - Pager - [phone]  After 7pm go to [email or web address] - password [email or web address]  And look for the night coverage person covering for me after hours  [facility] Office  [phone]

## 2017-02-07 NOTE — Evaluation (Signed)
Speech Language Pathology Evaluation Patient Details Name: Daniel Chapman MRN: [id number] DOB: May 16, 1932 Today's Date: 02/07/2017 Time: 6314-9702 SLP Time Calculation (min) (ACUTE ONLY): 33 min  Problem List:  Patient Active Problem List   Diagnosis Date Noted  . Stroke ([place]) 02/06/2017  . Atrial fibrillation, new onset ([place]) 02/06/2017  . Ischemic stroke ([place])   . Sebaceous cyst 01/03/2017  . Encounter for Medicare annual wellness exam 12/17/2014  . Special screening for malignant neoplasms, colon 12/17/2014  . Routine general medical examination at a health care facility 12/09/2014  . Aneurysm of abdominal vessel ([place]) 09/11/2013  . S/P total hip arthroplasty 08/02/2013  . H/O splenectomy 08/02/2013  . History of hip fracture 07/20/2013  . Occlusion and stenosis of carotid artery without mention of cerebral infarction 08/28/2012  . Carotid stenosis 08/14/2012  . Bruit of left carotid artery 08/01/2012  . Anxiety 01/27/2012  . Osteopenia 07/19/2011  . Prostate cancer screening 07/10/2011  . HOARSENESS, CHRONIC 02/05/2007  . 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 Hyperlipidemia 11/06/2006  . TOBACCO ABUSE 11/06/2006  . COPD (chronic obstructive pulmonary disease) ([place]) 11/06/2006  . COLONIC POLYPS, HX OF 11/06/2006  . PROSTATITIS, HX OF 11/06/2006  . 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 Carotid artery occlusion   . COPD (chronic obstructive pulmonary disease) ([place])   . Fall due to ice or snow Feb. 22, 2015   Partial Right Hip replaced  . History of ITP   . Hx of colonic polyps   . Hyperlipemia   . Osteopenia    ?  Marland Kitchen Osteoporosis    dexa 06, declines further dexa or tx   . Smoker    Past Surgical History:  Past Surgical History:  Procedure Laterality Date  . BONE MARROW BIOPSY  05/2003  . CATARACT EXTRACTION    . Dexa     per patient ? osteopenia  . HEMORRHOID SURGERY    . JOINT REPLACEMENT Right Feb. 22, 2015   Partial Hip  . SPLENECTOMY    . TONSILLECTOMY AND ADENOIDECTOMY Bilateral age 62 per patient   HPI:  [name] is an 83 y.o.male with h/o HLD, vocal cord cancer, tobacco use and COPDwho presented to the ED today for evaluation of new onset dizziness and incoordination. Symptoms also included difficulty speaking, headache, nausea and vomiting. He was found by his wife on the floor beside vomit and a coffee pot he knocked down. Initially pt was unable to speak, but eventually he was able to state that he was hit with "a bout of dizziness. He has vomited three times since Sunday.   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  Swallow evaluation ordered, given h/o vocal fold cancer s/p XRT, SLP to proceed with MBS.    Assessment / Plan / Recommendation Clinical Impression  MOCA 7.3 administered with pt demonstrating difficulties in areas of memory and mental math abilities.  His score was 18/30 indicative of impairment, question if this is baseline due to his reported memory deficits.  He was able to state 5 words from choices or with provided category cues - but did not recall any words independently.  Pt strengths included visuospatial skills and naming.  Pt's speech is clear with no evidence of significant language or articulation deficits.  Of note, pt did recall earlier session and importance of swallow precautions.  SlP will  follow up for memory compensation strategies and to ascertain baseline abilities from pt/family.  Pt agreeable to plan.     SLP Assessment  SLP Recommendation/Assessment: Patient needs continued Speech [facility] SLP Visit Diagnosis: Cognitive communication deficit (R41.841)    Follow Up Recommendations  Home health SLP    Frequency and Duration min 2x/week  2 weeks      SLP Evaluation Cognition  Overall Cognitive Status: No family/caregiver present to determine  baseline cognitive functioning Arousal/Alertness: Awake/alert Orientation Level: Oriented X4 Attention: Sustained Sustained Attention: Appears intact Memory: Impaired Memory Impairment: Retrieval deficit Awareness: Appears intact Problem Solving: Appears intact Safety/Judgment: Appears intact       Comprehension  Auditory Comprehension Overall Auditory Comprehension: Appears within functional limits for tasks assessed Yes/No Questions: Not tested Commands: Within Functional Limits Conversation: Complex Interfering Components: Hearing Visual Recognition/Discrimination Discrimination: Not tested Reading Comprehension Reading Status: Within funtional limits (for swallow precautions signs)    Expression Expression Primary Mode of Expression: Verbal Verbal Expression Overall Verbal Expression: Appears within functional limits for tasks assessed Initiation: No impairment Level of Generative/Spontaneous Verbalization: Conversation Repetition: Impaired Level of Impairment: Sentence level (at sentence level for detail, pt able to paraphrase sentence correctly for general meaning) Naming: No impairment Pragmatics: No impairment Written Expression Dominant Hand: Left (pt LEFT handed)   Oral / Motor  Oral Motor/Sensory Function Overall Oral Motor/Sensory Function: Within functional limits Motor Speech Overall Motor Speech: Appears within functional limits for tasks assessed Respiration: Within functional limits Phonation: Hoarse (premorbid due to vocal cord cancer s/p XRT) Resonance: Within functional limits Articulation: Within functional limitis Intelligibility: Intelligible Motor Planning: Witnin functional limits Motor Speech Errors: Not applicable   GO                    [name] 02/07/2017, 11:10 AM  Luanna Salk, [facility] SLP [phone]

## 2017-02-07 NOTE — Progress Notes (Signed)
STROKE TEAM PROGRESS NOTE   HISTORY OF PRESENT ILLNESS (per record) Daniel Chapman is an 83 y.o. male with a history of newly-diagnosed atrial fibrillation, vocal cord cancer, tobacco abuse (current smoker), COPD, ITP (s/p splenectomy), and hyperlipidemia, who presented to [facility] 02/06/2017 for evaluation of new onset dizziness and incoordination. Symptoms also included difficulty speaking, headache, nausea and vomiting. The patient was knocking things onto the floor as a result of the incoordination. Per ED triage note: "His wife who came home yesterday afternoon and found the pt laying on the floor beside vomit and a coffee pot he knocked down. Initially pt was unable to speak, but eventually he was able to state that he was hit with "a bout of dizziness"."He has vomited three times since Sunday.  CT head in the ED revealed a large right inferior cerebellar hemisphere ischemic infarction. Increased density overlapping the vertebrobasilar junction was felt by Radiology to possibly represent thrombosis and a CTA was recommended.   EKG in the ED reveals atrial fibrillation. He has no prior history of such.   Patient was not administered IV t-PA secondary to arriving outside of the treatment window. He was admitted to General Neurology for further evaluation and treatment.   SUBJECTIVE (INTERVAL HISTORY) No family is in the room.  The patient is awake, alert, and follows all commands appropriately. No complains. No acute event overnight. EKG showed afib. Pt denies any palpitation.    OBJECTIVE Temp:  [97.5 F (36.4 C)-97.9 F (36.6 C)] 97.9 F (36.6 C) (09/11 1300) Pulse Rate:  [65-85] 77 (09/11 1300) Cardiac Rhythm: Atrial fibrillation (09/11 0701) Resp:  [18-20] 19 (09/11 1300) BP: (109-151)/(66-92) 151/79 (09/11 1300) SpO2:  [96 %-100 %] 98 % (09/11 1300) Weight:  [56.6 kg (124 lb 11.2 oz)] 56.6 kg (124 lb 11.2 oz) (09/10 1531)  CBC:   Recent Labs Lab 02/06/17 0947 02/06/17 1009   WBC 9.9  --   NEUTROABS 7.2  --   HGB 13.0 13.9  HCT 40.4 41.0  MCV 90.4  --   PLT 224  --     Basic Metabolic Panel:   Recent Labs Lab 02/06/17 0947 02/06/17 1009  NA 137 138  K 4.1 4.1  CL 104 103  CO2 24  --   GLUCOSE 129* 128*  BUN 14 18  CREATININE 0.89 0.80  CALCIUM 9.3  --     Lipid Panel:     Component Value Date/Time   CHOL 148 02/06/2017 0947   TRIG 116 02/06/2017 0947   HDL 29 (L) 02/06/2017 0947   CHOLHDL 5.1 02/06/2017 0947   VLDL 23 02/06/2017 0947   LDLCALC 96 02/06/2017 0947   HgbA1c:  Lab Results  Component Value Date   HGBA1C 6.2 (H) 02/06/2017   Urine Drug Screen: No results found for: LABOPIA, COCAINSCRNUR, LABBENZ, AMPHETMU, THCU, LABBARB  Alcohol Level No results found for: [place] I have personally reviewed the radiological images below and agree with the radiology interpretations.  Ct Angio Head W Or Wo Contrast Ct Angio Neck W Or Wo Contrast 02/06/2017 IMPRESSION: Advanced atherosclerotic disease of the thoracic aorta but without evidence of aneurysm or dissection. Atherosclerotic disease at the brachiocephalic vessel origins but without stenosis greater than 20%. 40% stenosis in the midportion of the right common carotid artery. Complete occlusion of the right internal carotid artery at its origin. Atherosclerotic disease at the left carotid bifurcation. Detail is limited by artifact and motion. Stenosis is estimated at 50% at the proximal ICA.  Right vertebral artery widely patent through the neck. Web or focal dissection of the left subclavian artery just proximal to the left vertebral artery origin. Severe stenosis of the left vertebral artery origin, 80% or greater. The vessel is widely patent beyond that. Both posterior inferior cerebellar arteries are patent presently. No visible anterior inferior cerebellar artery on the right. This could be occluded or congenitally absent. Right ICA reconstitution in the supraclinoid region  secondary to external to internal collaterals, patent anterior communicating artery and patent posterior communicating artery. Parenchymal distributions of the anterior and middle cerebral vessels appear intact. Advanced chronic lung disease with advanced emphysema. Pleural and parenchymal scarring at both apices, with increased density compared to the CT chest of 2011. I cannot rule out the possibility of tumor in this region, though this may merely be progressive scarring. If the patient is a candidate for treatment pulmonary disease, complete chest CT would be warranted.   Dg Chest 2 View 02/06/2017 IMPRESSION: 1.  No acute cardiopulmonary disease. 2. COPD/chronic bronchitis. Pleural-parenchymal scarring at the apices.   Ct Head Wo Contrast 02/06/2017 IMPRESSION: Subacute and completed infarction of the right posterior inferior cerebellar territory. No intracranial hemorrhage, with minimal local mass effect secondary to edema. Sagittal reformatted images demonstrate questionable hyperdensity of the vertebrobasilar junction and the inferior basilar artery, potentially representing thrombus. CT angiogram may be useful.  Mri and Mra Brain Wo Contrast 02/06/2017 IMPRESSION: 1. Right inferior cerebellum late acute/early subacute infarction stable in distribution in comparison with prior CT of the head given differences in technique. No acute hemorrhage. 2. Stable background of mild chronic microvascular ischemic changes and mild parenchymal volume loss of the brain. 3. Right ICA occlusion with reconstitution at the paraclinoid segment to the terminus. Large right posterior communicating artery. 4. Patent circle of Willis. No large vessel occlusion, aneurysm, or significant stenosis is identified.  TTE 02/07/2017 Study Conclusions - Left ventricle: The cavity size was normal. Wall thickness was   normal. Systolic function was vigorous. The estimated ejection   fraction was in the range of 65% to 70%. -  Aortic valve: Moderately calcified annulus. Mildly thickened   leaflets. - Mitral valve: There was mild regurgitation.   PHYSICAL EXAM  Temp:  [97.5 F (36.4 C)-97.9 F (36.6 C)] 97.9 F (36.6 C) (09/11 1300) Pulse Rate:  [68-85] 77 (09/11 1300) Resp:  [18-20] 19 (09/11 1300) BP: (121-151)/(66-92) 151/79 (09/11 1300) SpO2:  [96 %-100 %] 98 % (09/11 1300)  General - Well nourished, well developed, in no apparent distress, poor denture.  Ophthalmologic - Sharp disc margins OU.   Cardiovascular - irregularly irregular heart rate and rhythm.  Mental Status -  Level of arousal and orientation to time, place, and person were intact. Language including expression, naming, repetition, comprehension was assessed and found intact. Fund of Knowledge was assessed and was impaired.  Cranial Nerves II - XII - II - Visual field intact OU. III, IV, VI - Extraocular movements intact. V - Facial sensation intact bilaterally. VII - Facial movement intact bilaterally. VIII - Hearing & vestibular intact bilaterally. X - Palate elevates symmetrically. XI - Chin turning & shoulder shrug intact bilaterally. XII - Tongue protrusion intact.  Motor Strength - The patient's strength was normal in all extremities and pronator drift was absent.  Bulk was normal and fasciculations were absent.   Motor Tone - Muscle tone was assessed at the neck and appendages and was normal.  Reflexes - The patient's reflexes were 1+ in all extremities and  he had no pathological reflexes.  Sensory - Light touch, temperature/pinprick, vibration and proprioception were assessed and were symmetrical.    Coordination - The patient had normal movements in the hands and feet with no ataxia or dysmetria.  Tremor was absent.  Gait and Station - deferred   ASSESSMENT/PLAN [name] is a 83 y.o. male with history of  presenting with  newly-diagnosed atrial fibrillation, vocal cord cancer, tobacco abuse (current  smoker), COPD, ITP (s/p splenectomy), and hyperlipidemia, who presented to [facility] 02/06/2017 for evaluation of new onset dizziness and incoordination. Symptoms also included difficulty speaking, headache, nausea and vomiting.  He did not receive IV t-PA due to presenting outside of the tPA treatment window.   Stroke:  Right PICA territory late acute/early subacute infarction in the setting of new-diagnosed atrial fibrillation not on AC  Resultant  Back to baseline   CT head: Subacute and completed infarction of the right posterior inferior cerebellar territory.  Probable thrombus in vertebrobasilar junction and the inferior basilar artery  CTA head and neck - right ICA chronic occlusion, left VA origin high grade stenosis, right VA widely patent.  MRI head - right cerebellar PICA infarct stable, no hemorrhage  MRA head - right ICA chronic occlusion  2D Echo: EF 60-65%. No source of embolus  LDL 96   HgbA1c 6.2  SCDs for VTE prophylaxis Diet clear liquid Room service appropriate? Yes; Fluid consistency: Thin  No antithrombotic prior to admission, now on clopidogrel 75 mg daily. Due to new diagnosed afib, will recommend to start anticoagulation such as eliquis 2.5mg  bid in 7 days to avoid hemorrhagic conversion. Once eliquis started, plavix can be discontinued.   Patient counseled to be compliant with his antithrombotic medications  Ongoing aggressive stroke risk factor management  Therapy recommendations:  pending  Disposition: pending  Afib  - new diagnosis  Confirmed on EKG  On plavix for now   Recommend eliquis 2.5mg  bid in 7 days to avoid hemorrhagic conversion  plavix can be discontinued once eliquis started.  Hx of ITP  S/p splenectomy   Resolved since  Concerning that ASA is the culprit   Avoid ASA   Right ICA chronic occlusion  Known oclcusion   Followed with [facility] as outpt  BP goal 130-150  Hypertension  Stable  Permissive hypertension (OK if <  220/120) but gradually normalize in 5-7 days  Long-term BP goal 130-150 due to right ICA occlusion  Hyperlipidemia  Home meds: none  LDL 96, goal < 70  Add rosuvastatin 10 mg PO daily  Continue statin at discharge  Tobacco abuse  Current smoker  Smoking cessation counseling provided  Pt is willing to quit  Other Stroke Risk Factors  Advanced age  Other Active Problems  History of vocal cord cancer  [facility] day # 1  Neurology will sign off. Please call with questions. Pt will follow up with Cecille Rubin, NP, at [facility] in about 6 weeks. Thanks for the consult.  Rosalin Hawking, MD PhD Stroke Neurology 02/07/2017 6:03 PM  To contact Stroke Continuity provider, please refer to [email or web address] After hours, contact General Neurology

## 2017-02-08 ENCOUNTER — Encounter (HOSPITAL_COMMUNITY): Payer: Self-pay | Admitting: Radiology

## 2017-02-08 ENCOUNTER — Inpatient Hospital Stay (HOSPITAL_COMMUNITY): Payer: Medicare HMO

## 2017-02-08 DIAGNOSIS — R269 Unspecified abnormalities of gait and mobility: Secondary | ICD-10-CM

## 2017-02-08 DIAGNOSIS — I69398 Other sequelae of cerebral infarction: Secondary | ICD-10-CM

## 2017-02-08 DIAGNOSIS — I6309 Cerebral infarction due to thrombosis of other precerebral artery: Secondary | ICD-10-CM

## 2017-02-08 DIAGNOSIS — I4891 Unspecified atrial fibrillation: Secondary | ICD-10-CM

## 2017-02-08 DIAGNOSIS — I714 Abdominal aortic aneurysm, without rupture: Secondary | ICD-10-CM

## 2017-02-08 LAB — BASIC METABOLIC PANEL
Anion gap: 6 (ref 5–15)
BUN: 13 mg/dL (ref 6–20)
CHLORIDE: 105 mmol/L (ref 101–111)
CO2: 24 mmol/L (ref 22–32)
CREATININE: 0.76 mg/dL (ref 0.61–1.24)
Calcium: 8.8 mg/dL — ABNORMAL LOW (ref 8.9–10.3)
GFR calc Af Amer: >60 mL/min (ref >60)
GFR calc non Af Amer: >60 mL/min (ref >60)
GLUCOSE: 102 mg/dL — AB (ref 65–99)
Potassium: 3.6 mmol/L (ref 3.5–5.1)
SODIUM: 135 mmol/L (ref 135–145)

## 2017-02-08 LAB — CBC
HCT: 38.4 % — ABNORMAL LOW (ref 39.0–52.0)
HEMOGLOBIN: 12.8 g/dL — AB (ref 13.0–17.0)
MCH: 29.7 pg (ref 26.0–34.0)
MCHC: 33.3 g/dL (ref 30.0–36.0)
MCV: 89.1 fL (ref 78.0–100.0)
Platelets: 230 10*3/uL (ref 150–400)
RBC: 4.31 MIL/uL (ref 4.22–5.81)
RDW: 15.2 % (ref 11.5–15.5)
WBC: 10 10*3/uL (ref 4.0–10.5)

## 2017-02-08 MED ORDER — IOPAMIDOL (ISOVUE-300) INJECTION 61%
INTRAVENOUS | Status: AC
  Administered 2017-02-08: 75 mL
  Filled 2017-02-08: qty 75

## 2017-02-08 NOTE — Consult Note (Signed)
Physical Medicine and Rehabilitation Consult Reason for Consult: Decreased functional mobility with dizziness Referring Physician: [facility]   HPI: Daniel Chapman is a 83 y.o. right handed male COPD/tobacco abuse, carcinoma vocal cord 2008, ITP, carotid artery occlusion. Patient lives with spouse. Independent prior to admission. 2 level home with bedroom a level in 3 steps to entry. Presented 02/06/2017 with dizziness/vomiting, unsteady gait as well as fall. EKG in the ED showed atrial fibrillation. CT of the head showed subacute completed infarction of the right posterior inferior cerebellar territory. No intracranial hemorrhage. Patient did not receive TPA. CT angiogram of head and neck showed advanced atherosclerotic disease with 40% stenosis in the midportion of the right common carotid artery. Complete occlusion of the right internal carotid artery at its origin. MRI with right inferior cerebellum relate acute early subacute infarcts stable in distribution in comparison to prior CT. MRA of the head with no large vessel occlusion or aneurysm. Echocardiogram with ejection fraction of 70%.. Neurology consulted placed on Plavix for CVA prophylaxis. Subcutaneous Lovenox for DVT prophylaxis. Cardiology follow-up for new onset atrial fibrillation at this time advised continue Plavix and consider outpatient DCCV after 3 weeks of anticoagulation if needed. Physical therapy evaluation completed with recommendations of physical medicine rehabilitation consult.  Patient feels that his balance is still off  Review of Systems  Constitutional: Negative for chills and fever.  HENT: Negative for hearing loss.   Eyes: Negative for blurred vision and double vision.  Respiratory: Negative for cough and shortness of breath.   Cardiovascular: Positive for palpitations. Negative for chest pain and leg swelling.  Gastrointestinal: Positive for nausea and vomiting.  Genitourinary: Positive for urgency. Negative  for dysuria and hematuria.  Musculoskeletal: Positive for falls and myalgias.  Skin: Negative for rash.  Neurological: Positive for dizziness and weakness. Negative for seizures.  All other systems reviewed and are negative.  Past Medical History:  Diagnosis Date  . Carcinoma of vocal cord [facility]) 2008   radiation therapy  . Carotid artery occlusion   . COPD (chronic obstructive pulmonary disease) ([place])   . Fall due to ice or snow Feb. 22, 2015   Partial Right Hip replaced  . History of ITP   . Hx of colonic polyps   . Hyperlipemia   . Osteopenia    ?  Marland Kitchen Osteoporosis    dexa 06, declines further dexa or tx   . Smoker    Past Surgical History:  Procedure Laterality Date  . BONE MARROW BIOPSY  05/2003  . CATARACT EXTRACTION    . Dexa     per patient ? osteopenia  . HEMORRHOID SURGERY    . JOINT REPLACEMENT Right Feb. 22, 2015   Partial Hip  . SPLENECTOMY    . TONSILLECTOMY AND ADENOIDECTOMY Bilateral age 42 per patient   Family History  Problem Relation Age of Onset  . Uterine cancer Mother   . Heart disease Sister        some heart trouble   Social History:  reports that he has been smoking Cigarettes.  He has been smoking about 0.50 packs per day. He has never used smokeless tobacco. He reports that he does not drink alcohol or use drugs. Allergies: No Known Allergies Medications Prior to Admission  Medication Sig Dispense Refill  . calcium carbonate (TUMS EX) 750 MG chewable tablet Chew 1 tablet by mouth daily. Reported on 09/15/2015    . cholecalciferol (VITAMIN D) 1000 UNITS tablet Take 1,000 Units by  mouth daily. Reported on 09/15/2015    . sertraline (ZOLOFT) 100 MG tablet TAKE 1/2 TABLET EVERY DAY  FOR  DEPRESSION 45 tablet 3    Home: Home Living Family/patient expects to be discharged to:: Private residence Living Arrangements: Spouse/significant other Available Help at Discharge: Family, Available 24 hours/day Type of Home: House Home Access: Stairs to  enter [employer] of Steps: 3-4 Home Layout: Two level, Able to live on main level with bedroom/bathroom Bathroom Shower/Tub: Chiropodist: Handicapped height Bathroom Accessibility: Yes Home Equipment: Civil engineer, contracting, Radiation protection practitioner Equipment: Reacher, Sock aid, Long-handled shoe horn, Long-handled sponge  Lives With: Spouse  Functional History: Prior Function Level of Independence: Independent Comments: Prior to incident, pt was independent with mobility and ADLs. 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 Transfers Overall transfer level: Needs assistance Equipment used: None Transfers: Sit to/from Stand, Stand Pivot Transfers Sit to Stand: Min assist Stand pivot transfers: Mod assist General transfer comment: LOB toward R Ambulation/Gait Ambulation/Gait assistance: Min assist, Mod assist Ambulation Distance (Feet): 180 Feet (two bouts of 90 feet) Assistive device: None Gait Pattern/deviations: Step-through pattern, Leaning posteriorly, Staggering left, Ataxic General Gait Details: during amb pt demonstrated 3 LOBs mod assist to prevent fall. Gait ataxic with posterior bias and staggers to the left intermittently. Gait velocity: decreased    ADL: ADL Overall ADL's : Needs assistance/impaired Eating/Feeding: Supervision/ safety, Set up, Sitting Eating/Feeding Details (indicate cue type and reason): modified  Grooming: Standing, Minimal assistance Upper Body Bathing: Supervision/ safety, Set up, Sitting Lower Body Bathing: Moderate assistance, Sit to/from stand Upper Body Dressing : Supervision/safety, Set up,  Sitting Lower Body Dressing: Moderate assistance, Sit to/from stand Toilet Transfer: Moderate assistance, Ambulation Toileting- Clothing Manipulation and Hygiene: Minimal assistance, Sit to/from stand Functional mobility during ADLs: Moderate assistance, Cueing for safety General ADL Comments: Difficulty completing ADL tasks without supoprt  Cognition: Cognition Overall Cognitive Status: Impaired/Different from baseline Arousal/Alertness: Awake/alert Orientation Level: Oriented X4, Oriented to person, Oriented to place, Oriented to time, Oriented to situation Attention: Sustained Sustained Attention: Appears intact Memory: Impaired Memory Impairment: Retrieval deficit Awareness: Appears intact Problem Solving: Appears intact Safety/Judgment: Appears intact Cognition Arousal/Alertness: Awake/alert Behavior During Therapy: WFL for tasks assessed/performed Overall Cognitive Status: Impaired/Different from baseline Area of Impairment: Safety/judgement, Awareness Safety/Judgement: Decreased awareness of safety, Decreased awareness of deficits Awareness: Emergent General Comments: will further assess  Blood pressure 130/84, pulse 87, temperature 97.6 F (36.4 C), temperature source Oral, resp. rate 18, height '5\' 7"'  (1.702 m), weight 56.6 kg (124 lb 11.2 oz), SpO2 98 %. Physical Exam  Vitals reviewed. Constitutional: He is oriented to person, place, and time.  HENT:  Head: Normocephalic.  Eyes: EOM are normal.  Neck: Normal range of motion. Neck supple. No thyromegaly present.  Cardiovascular:  Cardiac rate controlled  Respiratory: Effort normal and breath sounds normal. No respiratory distress.  GI: Soft. Bowel sounds are normal. He exhibits no distension.  Neurological: He is alert and oriented to person, place, and time.  Follows full commands. Fair awareness of deficits.  Skin: Skin is warm and dry.  Minimal dysmetria, right finger-nose-finger Motor strength is 5/5 bilateral  deltoid, biceps, triceps, grip, hip flexor, knee extensor,  ankle flexor. Eyes no evidence of nystagmus, no visual field deficits. Sensation intact to light touch bilateral upper and lower limbs  Results for orders placed or performed during the hospital encounter of 02/06/17 (from the past 24 hour(s))  CBC     Status: Abnormal   Collection Time: 02/08/17  3:25 AM  Result Value Ref Range   WBC 10.0 4.0 - 10.5 K/uL   RBC 4.31 4.22 - 5.81 MIL/uL   Hemoglobin 12.8 (L) 13.0 - 17.0 g/dL   HCT 38.4 (L) 39.0 - 52.0 %   MCV 89.1 78.0 - 100.0 fL   MCH 29.7 26.0 - 34.0 pg   MCHC 33.3 30.0 - 36.0 g/dL   RDW 15.2 11.5 - 15.5 %   Platelets 230 150 - 400 K/uL  Basic metabolic panel     Status: Abnormal   Collection Time: 02/08/17  3:25 AM  Result Value Ref Range   Sodium 135 135 - 145 mmol/L   Potassium 3.6 3.5 - 5.1 mmol/L   Chloride 105 101 - 111 mmol/L   CO2 24 22 - 32 mmol/L   Glucose, Bld 102 (H) 65 - 99 mg/dL   BUN 13 6 - 20 mg/dL   Creatinine, Ser 0.76 0.61 - 1.24 mg/dL   Calcium 8.8 (L) 8.9 - 10.3 mg/dL   GFR calc non Af Amer >60 >60 mL/min   GFR calc Af Amer >60 >60 mL/min   Anion gap 6 5 - 15   Ct Angio Head W Or Wo Contrast  Result Date: 02/06/2017 CLINICAL DATA:  Acute right cerebellar infarction. EXAM: CT ANGIOGRAPHY HEAD AND NECK TECHNIQUE: Multidetector CT imaging of the head and neck was performed using the standard protocol during bolus administration of intravenous contrast. Multiplanar CT image reconstructions and MIPs were obtained to evaluate the vascular anatomy. Carotid stenosis measurements (when applicable) are obtained utilizing NASCET criteria, using the distal internal carotid diameter as the denominator. CONTRAST:  50 cc Isovue 370 COMPARISON:  Head CT same day FINDINGS: CTA NECK FINDINGS Aortic arch: Aortic atherosclerosis. No aneurysm or dissection. Branching pattern of the brachiocephalic vessels from the arch is normal. Atherosclerotic disease at the  brachiocephalic vessel origins but without stenosis greater than about 20%. Right carotid system: Common carotid artery shows atherosclerotic disease with narrowing in the mid portion of 40%. Occlusion of the right ICA at the origin. No antegrade flow in the ICA at the skullbase. Left carotid system: Common carotid artery widely patent to the bifurcation region. Motion degradation at the bifurcation. Streak artifact at the bifurcation. Atherosclerotic disease with soft and calcified plaque. Stenosis of the proximal ICA estimated at 50%. Motion degradation in the cervical ICA region without suspicion of flow limiting stenosis in that region. Vertebral arteries: Right vertebral artery origin widely patent. No subclavian stenosis proximal or distal to that. Right vertebral artery is widely patent through the cervical region to the foramen magnum. Left subclavian artery shows a focal way lobe or localized dissection just proximal to the left vertebral artery origin. There is severe stenosis at the left vertebral artery origin, 80% or greater. The vessel is patent beyond [facility] through the cervical region to the foramen magnum. Skeleton: Ordinary mid cervical spondylosis. Other neck: No mass or lymphadenopathy. Benign appearing sebaceous cyst of the right lower neck. Upper chest: Extensive upper lobe emphysema with pleural and parenchymal scarring. Regions of scarring appear masslike and are progressive since the most recent chest CT of 05/05/2010. Depending on the clinical status, complete chest CT would  be suggested if the patient is a candidate for treatment of pulmonary disease. Review of the MIP images confirms the above findings CTA HEAD FINDINGS Anterior circulation: Left internal carotid artery is patent through the skullbase and siphon regions. There is some external to internal collateral flow with reconstitution of the distal siphon and supraclinoid ICA on the right. There are also patent anterior and posterior  communicating arteries. No ACA or MCA stenosis. No aneurysm or vascular malformation. Posterior circulation: Both vertebral arteries are patent at the foramen magnum level. Flow is present in both posterior inferior cerebellar artery is presently. Both vertebral artery supply the basilar. No basilar stenosis. Left anterior inferior cerebellar artery, superior cerebellar arteries and posterior cerebral arteries are patent bilaterally without evidence of flow limiting stenosis or missing components. It is not possible to determine if the right anterior inferior cerebellar artery is occluded or absent developmentally. Venous sinuses: Patent and normal Anatomic variants: None other significant Delayed phase: No abnormal enhancement Review of the MIP images confirms the above findings IMPRESSION: Advanced atherosclerotic disease of the thoracic aorta but without evidence of aneurysm or dissection. Atherosclerotic disease at the brachiocephalic vessel origins but without stenosis greater than 20%. 40% stenosis in the midportion of the right common carotid artery. Complete occlusion of the right internal carotid artery at its origin. Atherosclerotic disease at the left carotid bifurcation. Detail is limited by artifact and motion. Stenosis is estimated at 50% at the proximal ICA. Right vertebral artery widely patent through the neck. Web or focal dissection of the left subclavian artery just proximal to the left vertebral artery origin. Severe stenosis of the left vertebral artery origin, 80% or greater. The vessel is widely patent beyond that. Both posterior inferior cerebellar arteries are patent presently. No visible anterior inferior cerebellar artery on the right. This could be occluded or congenitally absent. Right ICA reconstitution in the supraclinoid region secondary to external to internal collaterals, patent anterior communicating artery and patent posterior communicating artery. Parenchymal distributions of the  anterior and middle cerebral vessels appear intact. Advanced chronic lung disease with advanced emphysema. Pleural and parenchymal scarring at both apices, with increased density compared to the CT chest of 2011. I cannot rule out the possibility of tumor in this region, though this may merely be progressive scarring. If the patient is a candidate for treatment pulmonary disease, complete chest CT would be warranted. Electronically Signed   By: Nelson Chimes M.D.   On: 02/06/2017 14:33   Dg Chest 2 View  Result Date: 02/06/2017 CLINICAL DATA:  Stroke.  No chest complaints. EXAM: CHEST  2 VIEW COMPARISON:  07/19/2011 FINDINGS: Hyperinflation. Lateral view degraded by patient arm position. Midthoracic compression deformity is mild to moderate and similar. Numerous leads and wires project over the chest. Midline trachea. Normal heart size and mediastinal contours for age. Left costophrenic angle excluded from the frontal radiograph. No pleural effusion or pneumothorax. Biapical pleural-parenchymal scarring. Diffuse peribronchial thickening. IMPRESSION: 1.  No acute cardiopulmonary disease. 2. COPD/chronic bronchitis. Pleural-parenchymal scarring at the apices. Electronically Signed   By: Abigail Miyamoto M.D.   On: 02/06/2017 12:42   Ct Head Wo Contrast  Result Date: 02/06/2017 CLINICAL DATA:  83 year old male with a history of dizziness EXAM: CT HEAD WITHOUT CONTRAST TECHNIQUE: Contiguous axial images were obtained from the base of the skull through the vertex without intravenous contrast. COMPARISON:  None. FINDINGS: Brain: Confluent hypodense territory involving the inferior and medial right cerebellar hemisphere. No acute intracranial hemorrhage. Minimal mass effect resulting  in slight narrowing of the fourth ventricle. Unremarkable configuration of the ventricles. No supratentorial hemorrhage, midline shift, mass effect. Gray-white differentiation of the anterior circulation maintained. Vascular: Calcifications  of intracranial vasculature. Sagittal reformatted images demonstrate questionable hyperdensity of the vertebrobasilar junction/inferior basilar artery. Skull: No acute bony abnormality.  No aggressive bone lesion. Sinuses/Orbits: No significant atherosclerotic changes. Other: None IMPRESSION: Subacute and completed infarction of the right posterior inferior cerebellar territory. No intracranial hemorrhage, with minimal local mass effect secondary to edema. Sagittal reformatted images demonstrate questionable hyperdensity of the vertebrobasilar junction and the inferior basilar artery, potentially representing thrombus. CT angiogram may be useful. Electronically Signed   By: Corrie Mckusick D.O.   On: 02/06/2017 11:07   Ct Angio Neck W Or Wo Contrast  Result Date: 02/06/2017 CLINICAL DATA:  Acute right cerebellar infarction. EXAM: CT ANGIOGRAPHY HEAD AND NECK TECHNIQUE: Multidetector CT imaging of the head and neck was performed using the standard protocol during bolus administration of intravenous contrast. Multiplanar CT image reconstructions and MIPs were obtained to evaluate the vascular anatomy. Carotid stenosis measurements (when applicable) are obtained utilizing NASCET criteria, using the distal internal carotid diameter as the denominator. CONTRAST:  50 cc Isovue 370 COMPARISON:  Head CT same day FINDINGS: CTA NECK FINDINGS Aortic arch: Aortic atherosclerosis. No aneurysm or dissection. Branching pattern of the brachiocephalic vessels from the arch is normal. Atherosclerotic disease at the brachiocephalic vessel origins but without stenosis greater than about 20%. Right carotid system: Common carotid artery shows atherosclerotic disease with narrowing in the mid portion of 40%. Occlusion of the right ICA at the origin. No antegrade flow in the ICA at the skullbase. Left carotid system: Common carotid artery widely patent to the bifurcation region. Motion degradation at the bifurcation. Streak artifact at  the bifurcation. Atherosclerotic disease with soft and calcified plaque. Stenosis of the proximal ICA estimated at 50%. Motion degradation in the cervical ICA region without suspicion of flow limiting stenosis in that region. Vertebral arteries: Right vertebral artery origin widely patent. No subclavian stenosis proximal or distal to that. Right vertebral artery is widely patent through the cervical region to the foramen magnum. Left subclavian artery shows a focal way lobe or localized dissection just proximal to the left vertebral artery origin. There is severe stenosis at the left vertebral artery origin, 80% or greater. The vessel is patent beyond [facility] through the cervical region to the foramen magnum. Skeleton: Ordinary mid cervical spondylosis. Other neck: No mass or lymphadenopathy. Benign appearing sebaceous cyst of the right lower neck. Upper chest: Extensive upper lobe emphysema with pleural and parenchymal scarring. Regions of scarring appear masslike and are progressive since the most recent chest CT of 05/05/2010. Depending on the clinical status, complete chest CT would be suggested if the patient is a candidate for treatment of pulmonary disease. Review of the MIP images confirms the above findings CTA HEAD FINDINGS Anterior circulation: Left internal carotid artery is patent through the skullbase and siphon regions. There is some external to internal collateral flow with reconstitution of the distal siphon and supraclinoid ICA on the right. There are also patent anterior and posterior communicating arteries. No ACA or MCA stenosis. No aneurysm or vascular malformation. Posterior circulation: Both vertebral arteries are patent at the foramen magnum level. Flow is present in both posterior inferior cerebellar artery is presently. Both vertebral artery supply the basilar. No basilar stenosis. Left anterior inferior cerebellar artery, superior cerebellar arteries and posterior cerebral arteries are  patent bilaterally without evidence of flow limiting  stenosis or missing components. It is not possible to determine if the right anterior inferior cerebellar artery is occluded or absent developmentally. Venous sinuses: Patent and normal Anatomic variants: None other significant Delayed phase: No abnormal enhancement Review of the MIP images confirms the above findings IMPRESSION: Advanced atherosclerotic disease of the thoracic aorta but without evidence of aneurysm or dissection. Atherosclerotic disease at the brachiocephalic vessel origins but without stenosis greater than 20%. 40% stenosis in the midportion of the right common carotid artery. Complete occlusion of the right internal carotid artery at its origin. Atherosclerotic disease at the left carotid bifurcation. Detail is limited by artifact and motion. Stenosis is estimated at 50% at the proximal ICA. Right vertebral artery widely patent through the neck. Web or focal dissection of the left subclavian artery just proximal to the left vertebral artery origin. Severe stenosis of the left vertebral artery origin, 80% or greater. The vessel is widely patent beyond that. Both posterior inferior cerebellar arteries are patent presently. No visible anterior inferior cerebellar artery on the right. This could be occluded or congenitally absent. Right ICA reconstitution in the supraclinoid region secondary to external to internal collaterals, patent anterior communicating artery and patent posterior communicating artery. Parenchymal distributions of the anterior and middle cerebral vessels appear intact. Advanced chronic lung disease with advanced emphysema. Pleural and parenchymal scarring at both apices, with increased density compared to the CT chest of 2011. I cannot rule out the possibility of tumor in this region, though this may merely be progressive scarring. If the patient is a candidate for treatment pulmonary disease, complete chest CT would be  warranted. Electronically Signed   By: Nelson Chimes M.D.   On: 02/06/2017 14:33   Mr Brain Wo Contrast  Result Date: 02/06/2017 CLINICAL DATA:  83 y/o  M; follow-up of stroke. EXAM: MRI HEAD WITHOUT CONTRAST MRA HEAD WITHOUT CONTRAST TECHNIQUE: Multiplanar, multiecho pulse sequences of the brain and surrounding structures were obtained without intravenous contrast. Angiographic images of the head were obtained using MRA technique without contrast. COMPARISON:  02/06/2017 CT of the head and CTA of the head. FINDINGS: MRI HEAD FINDINGS Brain: Reduced diffusion in the right inferior cerebellum. Associated T2 hyperintense signal abnormality and mild local mass effect compatible with late acute/ early subacute infarction. Unsure diffusion signal abnormality in the brain. No abnormal susceptibility hypointensity to indicate intracranial hemorrhage. No hydrocephalus or effacement of basilar cisterns. Background of few scattered nonspecific foci of T2 FLAIR hyperintense signal abnormality in subcortical and periventricular white matter is compatible with mild chronic microvascular ischemic changes for age. Mild brain parenchymal volume loss. Vascular: As below. Skull and upper cervical spine: Normal marrow signal. Sinuses/Orbits: Mild right maxillary sinus mucosal thickening. Bilateral intra-ocular lens replacement. No abnormal signal of mastoid air cells Other: None. MRA HEAD FINDINGS Internal carotid arteries: Patent left internal carotid artery. Mild irregularity of the cavernous segment without stenosis compatible with intracranial atherosclerosis. The right internal carotid artery is occluded to the paraclinoid segment where it is reconstituted to the terminus. Anterior cerebral arteries:  Patent. Middle cerebral arteries: Patent. Anterior communicating artery: Patent. Posterior communicating arteries: Large right and possible diminutive left posterior communicating arteries. Posterior cerebral arteries:  Patent.  Basilar artery:  Patent. Vertebral arteries:  Patent. No evidence of high-grade stenosis, large vessel occlusion, or aneurysm unless noted above. IMPRESSION: 1. Right inferior cerebellum late acute/early subacute infarction stable in distribution in comparison with prior CT of the head given differences in technique. No acute hemorrhage. 2. Stable background of  mild chronic microvascular ischemic changes and mild parenchymal volume loss of the brain. 3. Right ICA occlusion with reconstitution at the paraclinoid segment to the terminus. Large right posterior communicating artery. 4. Patent circle of Willis. No large vessel occlusion, aneurysm, or significant stenosis is identified. Electronically Signed   By: Kristine Garbe M.D.   On: 02/06/2017 19:40   Dg Swallowing Func-speech Pathology  Result Date: 02/07/2017 Objective Swallowing Evaluation: Type of Study: MBS-Modified Barium Swallow Study Patient Details Name: REYHAN MORONTA MRN: [id number] Date of Birth: 1932/04/16 Today's Date: 02/07/2017 Time: SLP Start Time (ACUTE ONLY): 0819-SLP Stop Time (ACUTE ONLY): 0845 SLP Time Calculation (min) (ACUTE ONLY): 26 min Past Medical History: Past Medical History: Diagnosis Date . 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 Carotid artery occlusion  . COPD (chronic obstructive pulmonary disease) ([place])  . Fall due to ice or snow Feb. 22, 2015  Partial Right Hip replaced . History of ITP  . Hx of colonic polyps  . Hyperlipemia  . Osteopenia   ? Mar[facility] Kitchen Osteoporosis   dexa 06, declines further dexa or tx  . Smoker  Past Surgical History: Past Surgical History: Procedure Laterality Date . BONE MARROW BIOPSY  05/2003 . CATARACT EXTRACTION   . Dexa    per patient ? osteopenia . HEMORRHOID SURGERY   . JOINT REPLACEMENT Right Feb. 22, 2015  Partial Hip . SPLENECTOMY   . TONSILLECTOMY AND ADENOIDECTOMY Bilateral age 60 per patient HPI: Mr. Gadson is an 83 y.o.male with h/o HLD, vocal cord cancer, tobacco use and  COPDwho presented to the ED today for evaluation of new onset dizziness and incoordination. Symptoms also included difficulty speaking, headache, nausea and vomiting. He was found by his wife on the floor beside vomit and a coffee pot he knocked down. Initially pt was unable to speak, but eventually he was able to state that he was hit with "a bout of dizziness. He has vomited three times since Sunday.   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  Swallow evaluation ordered, given h/o vocal fold cancer s/p XRT, SLP to proceed with MBS.  Subjective: pt awake in chair Assessment / Plan / Recommendation CHL IP CLINICAL IMPRESSIONS 02/07/2017 Clinical Impression Baseline coughing and expectorating secretions noted.  Pt presents with moderate pharyngeal dysphagia - suspect exacerbation of baseline deficits *known h/o dysphagia from vocal cord cancer s/p XRT.  Decreased pharyngeal contraction/tongue base retraction and epiglottic deflection results in residuals (vallecular worse than pyriform sinus) without pt consistent awareness.   Cued dry swallow helpful to decrease residuals that pt does NOT sense and following solid with liquid helpful.  Pt suspected to aspirate with consumption of thin via straw *although missed actual swallow on flouro*.  Pt overtly coughed immediately postswallow of thin via straw - with at least laryngeal penetration observed.  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  Cough with backflow of barium into pharynx and vomiting observed - suspect due to cerebellar cva.  NO aspiration of emesis noted.  Recommend start clear liquid diet with strict precautions.  Using teach back, pt was educated to findings/recommendations thoroughly.   SLP Visit Diagnosis Dysphagia, oropharyngeal phase (R13.12) Attention and concentration deficit following -- Frontal lobe and executive function deficit following -- Impact on safety and function Moderate aspiration risk   CHL  IP TREATMENT RECOMMENDATION 02/07/2017 Treatment Recommendations F/U MBS in --- days (Comment)   Prognosis 02/07/2017 Prognosis for Safe Diet Advancement Fair Barriers to  Reach Goals Severity of deficits Barriers/Prognosis Comment -- CHL IP DIET RECOMMENDATION 02/07/2017 SLP Diet Recommendations Thin liquid Liquid Administration via Cup;No straw Medication Administration Other (Comment) Compensations Small sips/bites;Slow rate;Follow solids with liquid;Multiple dry swallows after each bite/sip;Other (Comment) Postural Changes Seated upright at 90 degrees;Remain semi-upright after after feeds/meals (Comment)   CHL IP OTHER RECOMMENDATIONS 02/07/2017 Recommended Consults -- Oral Care Recommendations Oral care before and after PO Other Recommendations --   CHL IP FOLLOW UP RECOMMENDATIONS 02/07/2017 Follow up Recommendations None   CHL IP FREQUENCY AND DURATION 02/07/2017 Speech Therapy Frequency (ACUTE ONLY) min 2x/week Treatment Duration 2 weeks      CHL IP ORAL PHASE 02/07/2017 Oral Phase Impaired Oral - Pudding Teaspoon -- Oral - Pudding Cup -- Oral - Honey Teaspoon -- Oral - Honey Cup -- Oral - Nectar Teaspoon -- Oral - Nectar Cup -- Oral - Nectar Straw -- Oral - Thin Teaspoon WFL Oral - Thin Cup WFL Oral - Thin Straw Premature spillage Oral - Puree WFL Oral - Mech Soft WFL Oral - Regular -- Oral - Multi-Consistency -- Oral - Pill -- Oral Phase - Comment --  CHL IP PHARYNGEAL PHASE 02/07/2017 Pharyngeal Phase Impaired Pharyngeal- Pudding Teaspoon -- Pharyngeal -- Pharyngeal- Pudding Cup -- Pharyngeal -- Pharyngeal- Honey Teaspoon -- Pharyngeal -- Pharyngeal- Honey Cup -- Pharyngeal -- Pharyngeal- Nectar Teaspoon Reduced pharyngeal peristalsis;Reduced epiglottic inversion;Reduced anterior laryngeal mobility;Reduced laryngeal elevation;Reduced airway/laryngeal closure;Reduced tongue base retraction;Pharyngeal residue - valleculae;Pharyngeal residue - pyriform Pharyngeal -- Pharyngeal- Nectar Cup Reduced anterior laryngeal  mobility;Reduced laryngeal elevation;Reduced epiglottic inversion;Reduced pharyngeal peristalsis;Reduced tongue base retraction;Pharyngeal residue - valleculae;Pharyngeal residue - pyriform Pharyngeal -- Pharyngeal- Nectar Straw -- Pharyngeal -- Pharyngeal- Thin Teaspoon Reduced pharyngeal peristalsis;Reduced epiglottic inversion;Reduced anterior laryngeal mobility;Reduced laryngeal elevation;Reduced airway/laryngeal closure;Reduced tongue base retraction;Pharyngeal residue - valleculae;Pharyngeal residue - pyriform Pharyngeal -- Pharyngeal- Thin Cup Reduced pharyngeal peristalsis;Reduced epiglottic inversion;Reduced anterior laryngeal mobility;Reduced laryngeal elevation;Reduced airway/laryngeal closure;Reduced tongue base retraction;Pharyngeal residue - valleculae;Pharyngeal residue - pyriform Pharyngeal -- Pharyngeal- Thin Straw Reduced pharyngeal peristalsis;Reduced epiglottic inversion;Reduced anterior laryngeal mobility;Reduced laryngeal elevation;Reduced airway/laryngeal closure;Reduced tongue base retraction;Penetration/Aspiration during swallow Pharyngeal Material enters airway, passes BELOW cords and not ejected out despite cough attempt by patient Pharyngeal- Puree Reduced pharyngeal peristalsis;Reduced epiglottic inversion;Reduced tongue base retraction;Pharyngeal residue - valleculae Pharyngeal -- Pharyngeal- Mechanical Soft Reduced pharyngeal peristalsis;Reduced epiglottic inversion;Reduced tongue base retraction;Pharyngeal residue - valleculae Pharyngeal -- Pharyngeal- Regular -- Pharyngeal -- Pharyngeal- Multi-consistency -- Pharyngeal -- Pharyngeal- Pill -- Pharyngeal -- Pharyngeal Comment CUED DRY SWALLOWS helpful to decrease residuals, "hock" to clear helpful but not fully effective  CHL IP CERVICAL ESOPHAGEAL PHASE 02/07/2017 Cervical Esophageal Phase Impaired Pudding Teaspoon -- Pudding Cup -- Honey Teaspoon -- Honey Cup -- Nectar Teaspoon -- Nectar Cup -- Nectar Straw -- Thin Teaspoon -- Thin  Cup -- Thin Straw -- Puree -- Mechanical Soft -- Regular -- Multi-consistency -- Pill -- Cervical Esophageal Comment pt with strong coughing noted resulting in backflow of barium into pharynx and caused vomiting, upon esophageal sweep at end of MBS - pt appeared with clear esophagus No flowsheet data found. [name] 02/07/2017, 10:07 AM  Luanna Salk, MS [facility] SLP [phone]             Mr Jodene Nam Head Wo Contrast  Result Date: 02/06/2017 CLINICAL DATA:  83 y/o  M; follow-up of stroke. EXAM: MRI HEAD WITHOUT CONTRAST MRA HEAD WITHOUT CONTRAST TECHNIQUE: Multiplanar, multiecho pulse sequences of the brain and surrounding structures were obtained without intravenous contrast. Angiographic images of the head were obtained  using MRA technique without contrast. COMPARISON:  02/06/2017 CT of the head and CTA of the head. FINDINGS: MRI HEAD FINDINGS Brain: Reduced diffusion in the right inferior cerebellum. Associated T2 hyperintense signal abnormality and mild local mass effect compatible with late acute/ early subacute infarction. Unsure diffusion signal abnormality in the brain. No abnormal susceptibility hypointensity to indicate intracranial hemorrhage. No hydrocephalus or effacement of basilar cisterns. Background of few scattered nonspecific foci of T2 FLAIR hyperintense signal abnormality in subcortical and periventricular white matter is compatible with mild chronic microvascular ischemic changes for age. Mild brain parenchymal volume loss. Vascular: As below. Skull and upper cervical spine: Normal marrow signal. Sinuses/Orbits: Mild right maxillary sinus mucosal thickening. Bilateral intra-ocular lens replacement. No abnormal signal of mastoid air cells Other: None. MRA HEAD FINDINGS Internal carotid arteries: Patent left internal carotid artery. Mild irregularity of the cavernous segment without stenosis compatible with intracranial atherosclerosis. The right internal carotid artery is occluded to the  paraclinoid segment where it is reconstituted to the terminus. Anterior cerebral arteries:  Patent. Middle cerebral arteries: Patent. Anterior communicating artery: Patent. Posterior communicating arteries: Large right and possible diminutive left posterior communicating arteries. Posterior cerebral arteries:  Patent. Basilar artery:  Patent. Vertebral arteries:  Patent. No evidence of high-grade stenosis, large vessel occlusion, or aneurysm unless noted above. IMPRESSION: 1. Right inferior cerebellum late acute/early subacute infarction stable in distribution in comparison with prior CT of the head given differences in technique. No acute hemorrhage. 2. Stable background of mild chronic microvascular ischemic changes and mild parenchymal volume loss of the brain. 3. Right ICA occlusion with reconstitution at the paraclinoid segment to the terminus. Large right posterior communicating artery. 4. Patent circle of Willis. No large vessel occlusion, aneurysm, or significant stenosis is identified. Electronically Signed   By: Kristine Garbe M.D.   On: 02/06/2017 19:40    Assessment/Plan: Diagnosis: Right cerebellar infarct , mainly with truncal ataxia, stroke related to gait disorder 1. Does the need for close, 24 hr/day medical supervision in concert with the patient's rehab needs make it unreasonable for this patient to be served in a less intensive setting? Yes 2. Co-Morbidities requiring supervision/potential complications: New onset atrial fibrillation, COPD 3. Due to bladder management, bowel management, safety, skin/wound care, disease management, medication administration, pain management and patient education, does the patient require 24 hr/day rehab nursing? Yes 4. Does the patient require coordinated care of a physician, rehab nurse, PT (1-2 hrs/day, 5 days/week) and OT (1-2 hrs/day, 5 days/week) to address physical and functional deficits in the context of the above medical diagnosis(es)?  Yes Addressing deficits in the following areas: balance, endurance, locomotion, strength, transferring, bowel/bladder control, bathing, dressing, feeding, grooming, toileting and psychosocial support 5. Can the patient actively participate in an intensive therapy program of at least 3 hrs of therapy per day at least 5 days per week? Yes 6. The potential for patient to make measurable gains while on inpatient rehab is excellent 7. Anticipated functional outcomes upon discharge from inpatient rehab are modified independent  with PT, modified independent with OT, n/a with SLP. 8. Estimated rehab length of stay to reach the above functional goals is: 7-10d 9. Anticipated D/C setting: Home 10. Anticipated post D/C treatments: [place] therapy 11. Overall Rehab/Functional Prognosis: excellent  RECOMMENDATIONS: This patient's condition is appropriate for continued rehabilitative care in the following setting: CIR Patient has agreed to participate in recommended program. Yes Note that insurance prior authorization may be required for reimbursement for recommended care.  Comment:  Charlett Blake M.D. [facility] FAAPM&R (Sports Med, Neuromuscular Med) Diplomate Am Board of Electrodiagnostic Med  Cathlyn Parsons., PA-C 02/08/2017

## 2017-02-08 NOTE — Progress Notes (Signed)
[facility]                                                                              Patient Demographics  Daniel Chapman, is a 83 y.o. male, DOB - 1932/03/14, CVE:[id number]  Admit date - 02/06/2017   Admitting Physician Waldemar Dickens, MD  Outpatient Primary MD for the patient is Tower, [name], MD  Outpatient specialists:   LOS - 2  days   Medical records reviewed and are as summarized below:    Chief Complaint  Patient presents with  . Dizziness       Brief summary   HPI on 02/06/2017 by Ms. [name], NP Daniel Chapman a 83 y.o.malewith medical history significant for COPD not on home oxygen, tobacco use, vocal cord cancer, ITP, hyperlipidemia, carotid artery occlusion presents toemergency Department chief complaint dizziness fall yesterday. Initial evaluation reveals afib new onset andincludes a CT of the headrevealing Subacute and completed infarction of the right posterior inferior cerebellar territory. No intracranial hemorrhage, with minimal local mass effect secondary to edema.   Information is obtained from the patient and his wife is at the bedside. Patient is unable to articulate factly what happened yesterday. He can say that he became dizzy and fell down. He then got himself up to the chair. He does not remember vomiting but wife says when she came home from the grocery store she found him sitting in the chair with emesis on his shirt. In addition she found the house" disarray". Patient denies any headache visual disturbances numbness tingling of extremities. He denies chest pain palpitations shortness of breath. He denies nausea and does not remember vomiting. He denies diaphoresis lower extremity edema.He denies any fever chills recent travel or sick contacts. He denies dysuria hematuria frequency or urgency. He denies diarrhea constipation melena bright red blood per rectum. Wife states he appeared to have difficulty talking initially but  that improved as the evening wore on. He slept through the night without any problems. She called the PCP this morning to report what happened and they recommend she come to the emergency department   Assessment & Plan    Principal Problem: Acute CVA - CT head showed subacute and completed infarction of the right posterior inferior cerebellar territory. No intracranial hemorrhage with minimal local mass effect secondary to edema. - MRI/MRA showed right inferior cerebellum with late acute/early subacute infarction. Right ICA occlusion with reconstitution at the paraclinoid segment - 2-D echo with EF of 65-70% - CT angiogram of the head and neck showed advanced atherosclerotic disease of the thoracic aorta, no aneurysm or dissection, 40% stenosis in the midportion of right common carotid artery, complete occlusion of the right ICA adnexal region. Webb or focal dissection of the left subclavian artery just proximal to left vertebral artery region. Severe stenosis of the left vertebral artery lesion 80% or greater. - LDL 96, hemoglobin A1c 6.2 - PT OT recommended CIR, consult pending - Per neurology, continue Plavix, statin. Aspirin held secondary to history of ITP - Neurology following - Seen by speech therapy, recommended clear liquid diet   Active Problems:    COPD (  chronic obstructive pulmonary disease) ([place]), tobacco abuse - CT angiogram of the head and neck showed advanced chronic lung disease with advanced emphysema, pleural and parenchymal scarring at both apices with increased density compared to CT chest of 2011 cannot rule out the possibility of tumor in this region though this may be progressive scarring. Complete chest CT warranted. - Currently no wheezing, consult on smoking cessation  New onset atrial fibrillation - Currently on Plavix  - Given patient's size of stroke, neurology recommended waiting 7 days before starting anticoagulation, eliquis in 7 days  - once eliquis  started, Plavix can be discontinued  History of ITP - Status post splenectomy, recommended to avoid aspirin  Right ICA chronic occlusion - Followed by vascular surgery as outpatient, BP goal 1:30 to 150  Hyperlipidemia - LDL 96, goal less than 70, started on Crestor 10 mg daily   Code Status: full  DVT Prophylaxis:  Lovenox  Family Communication: Discussed in detail with the patient, all imaging results, lab results explained to the patient    Disposition Plan:   Time Spent in minutes   25 minutes  Procedures:    Consultants:   Neuro   Antimicrobials:      Medications  Scheduled Meds: . clopidogrel  75 mg Oral Daily  . enoxaparin (LOVENOX) injection  40 mg Subcutaneous Q24H  . rosuvastatin  10 mg Oral q1800   Continuous Infusions: PRN Meds:.acetaminophen **OR** acetaminophen (TYLENOL) oral liquid 160 mg/5 mL **OR** acetaminophen, ondansetron (ZOFRAN) IV, senna-docusate   Antibiotics   Anti-infectives    None        Subjective:   Daniel Chapman was seen and examined today. Patient denies dizziness, chest pain, shortness of breath, abdominal pain, N/V/D/C, new weakness, numbess, tingling. No acute events overnight.    Objective:   Vitals:   02/08/17 0100 02/08/17 0500 02/08/17 0811 02/08/17 1159  BP: 128/84 130/84 116/84 (!) 138/91  Pulse: 81 87 87 75  Resp: _0 Temp: 97.7 F (36.5 C) 97.6 F (36.4 C) 98.4 F (36.9 C) 98.1 F (36.7 C)  TempSrc: Oral Oral Oral Oral  SpO2: 98% 98% 99% 99%  Weight:      Height:        Intake/Output Summary (Last 24 hours) at 02/08/17 1218 Last data filed at 02/08/17 0817  Gross per 24 hour  Intake              240 ml  Output              375 ml  Net             -135 ml     Wt Readings from Last 3 Encounters:  02/06/17 56.6 kg (124 lb 11.2 oz)  01/03/17 57.3 kg (126 lb 4 oz)  12/27/16 57.5 kg (126 lb 12.8 oz)     Exam  General: Alert and oriented x 3, NAD  Eyes:   HEENT:  Atraumatic,  normocephalic, normal oropharynx  Cardiovascular : S1 and S2 clear, 2/6 SEM, irregular   Respiratory: Clear to auscultation bilaterally, no wheezing, rales or rhonchi  Gastrointestinal: Soft, nontender, nondistended, + bowel sounds  Ext: no pedal edema bilaterally  Neuro: AAOx3, Cr N's II- XII. Strength 5/5 upper and lower extremities bilaterally, hearing deficit  Musculoskeletal: No digital cyanosis, clubbing  Skin: No rashes  Psych: Normal affect and demeanor, alert and oriented x3    Data Reviewed:  I have personally reviewed following labs and imaging studies  Micro Results No results found for this or any previous visit (from the past 240 hour(s)).  Radiology Reports Ct Angio Head W Or Wo Contrast  Result Date: 02/06/2017 CLINICAL DATA:  Acute right cerebellar infarction. EXAM: CT ANGIOGRAPHY HEAD AND NECK TECHNIQUE: Multidetector CT imaging of the head and neck was performed using the standard protocol during bolus administration of intravenous contrast. Multiplanar CT image reconstructions and MIPs were obtained to evaluate the vascular anatomy. Carotid stenosis measurements (when applicable) are obtained utilizing NASCET criteria, using the distal internal carotid diameter as the denominator. CONTRAST:  50 cc Isovue 370 COMPARISON:  Head CT same day FINDINGS: CTA NECK FINDINGS Aortic arch: Aortic atherosclerosis. No aneurysm or dissection. Branching pattern of the brachiocephalic vessels from the arch is normal. Atherosclerotic disease at the brachiocephalic vessel origins but without stenosis greater than about 20%. Right carotid system: Common carotid artery shows atherosclerotic disease with narrowing in the mid portion of 40%. Occlusion of the right ICA at the origin. No antegrade flow in the ICA at the skullbase. Left carotid system: Common carotid artery widely patent to the bifurcation region. Motion degradation at the bifurcation. Streak artifact at the bifurcation.  Atherosclerotic disease with soft and calcified plaque. Stenosis of the proximal ICA estimated at 50%. Motion degradation in the cervical ICA region without suspicion of flow limiting stenosis in that region. Vertebral arteries: Right vertebral artery origin widely patent. No subclavian stenosis proximal or distal to that. Right vertebral artery is widely patent through the cervical region to the foramen magnum. Left subclavian artery shows a focal way lobe or localized dissection just proximal to the left vertebral artery origin. There is severe stenosis at the left vertebral artery origin, 80% or greater. The vessel is patent beyond [facility] through the cervical region to the foramen magnum. Skeleton: Ordinary mid cervical spondylosis. Other neck: No mass or lymphadenopathy. Benign appearing sebaceous cyst of the right lower neck. Upper chest: Extensive upper lobe emphysema with pleural and parenchymal scarring. Regions of scarring appear masslike and are progressive since the most recent chest CT of 05/05/2010. Depending on the clinical status, complete chest CT would be suggested if the patient is a candidate for treatment of pulmonary disease. Review of the MIP images confirms the above findings CTA HEAD FINDINGS Anterior circulation: Left internal carotid artery is patent through the skullbase and siphon regions. There is some external to internal collateral flow with reconstitution of the distal siphon and supraclinoid ICA on the right. There are also patent anterior and posterior communicating arteries. No ACA or MCA stenosis. No aneurysm or vascular malformation. Posterior circulation: Both vertebral arteries are patent at the foramen magnum level. Flow is present in both posterior inferior cerebellar artery is presently. Both vertebral artery supply the basilar. No basilar stenosis. Left anterior inferior cerebellar artery, superior cerebellar arteries and posterior cerebral arteries are patent bilaterally  without evidence of flow limiting stenosis or missing components. It is not possible to determine if the right anterior inferior cerebellar artery is occluded or absent developmentally. Venous sinuses: Patent and normal Anatomic variants: None other significant Delayed phase: No abnormal enhancement Review of the MIP images confirms the above findings IMPRESSION: Advanced atherosclerotic disease of the thoracic aorta but without evidence of aneurysm or dissection. Atherosclerotic disease at the brachiocephalic vessel origins but without stenosis greater than 20%. 40% stenosis in the midportion of the right common carotid artery. Complete occlusion of the right internal carotid artery at its origin. Atherosclerotic disease at the left carotid bifurcation. Detail  is limited by artifact and motion. Stenosis is estimated at 50% at the proximal ICA. Right vertebral artery widely patent through the neck. Web or focal dissection of the left subclavian artery just proximal to the left vertebral artery origin. Severe stenosis of the left vertebral artery origin, 80% or greater. The vessel is widely patent beyond that. Both posterior inferior cerebellar arteries are patent presently. No visible anterior inferior cerebellar artery on the right. This could be occluded or congenitally absent. Right ICA reconstitution in the supraclinoid region secondary to external to internal collaterals, patent anterior communicating artery and patent posterior communicating artery. Parenchymal distributions of the anterior and middle cerebral vessels appear intact. Advanced chronic lung disease with advanced emphysema. Pleural and parenchymal scarring at both apices, with increased density compared to the CT chest of 2011. I cannot rule out the possibility of tumor in this region, though this may merely be progressive scarring. If the patient is a candidate for treatment pulmonary disease, complete chest CT would be warranted. Electronically  Signed   By: Nelson Chimes M.D.   On: 02/06/2017 14:33   Dg Chest 2 View  Result Date: 02/06/2017 CLINICAL DATA:  Stroke.  No chest complaints. EXAM: CHEST  2 VIEW COMPARISON:  07/19/2011 FINDINGS: Hyperinflation. Lateral view degraded by patient arm position. Midthoracic compression deformity is mild to moderate and similar. Numerous leads and wires project over the chest. Midline trachea. Normal heart size and mediastinal contours for age. Left costophrenic angle excluded from the frontal radiograph. No pleural effusion or pneumothorax. Biapical pleural-parenchymal scarring. Diffuse peribronchial thickening. IMPRESSION: 1.  No acute cardiopulmonary disease. 2. COPD/chronic bronchitis. Pleural-parenchymal scarring at the apices. Electronically Signed   By: Abigail Miyamoto M.D.   On: 02/06/2017 12:42   Ct Head Wo Contrast  Result Date: 02/06/2017 CLINICAL DATA:  83 year old male with a history of dizziness EXAM: CT HEAD WITHOUT CONTRAST TECHNIQUE: Contiguous axial images were obtained from the base of the skull through the vertex without intravenous contrast. COMPARISON:  None. FINDINGS: Brain: Confluent hypodense territory involving the inferior and medial right cerebellar hemisphere. No acute intracranial hemorrhage. Minimal mass effect resulting in slight narrowing of the fourth ventricle. Unremarkable configuration of the ventricles. No supratentorial hemorrhage, midline shift, mass effect. Gray-white differentiation of the anterior circulation maintained. Vascular: Calcifications of intracranial vasculature. Sagittal reformatted images demonstrate questionable hyperdensity of the vertebrobasilar junction/inferior basilar artery. Skull: No acute bony abnormality.  No aggressive bone lesion. Sinuses/Orbits: No significant atherosclerotic changes. Other: None IMPRESSION: Subacute and completed infarction of the right posterior inferior cerebellar territory. No intracranial hemorrhage, with minimal local mass  effect secondary to edema. Sagittal reformatted images demonstrate questionable hyperdensity of the vertebrobasilar junction and the inferior basilar artery, potentially representing thrombus. CT angiogram may be useful. Electronically Signed   By: Corrie Mckusick D.O.   On: 02/06/2017 11:07   Ct Angio Neck W Or Wo Contrast  Result Date: 02/06/2017 CLINICAL DATA:  Acute right cerebellar infarction. EXAM: CT ANGIOGRAPHY HEAD AND NECK TECHNIQUE: Multidetector CT imaging of the head and neck was performed using the standard protocol during bolus administration of intravenous contrast. Multiplanar CT image reconstructions and MIPs were obtained to evaluate the vascular anatomy. Carotid stenosis measurements (when applicable) are obtained utilizing NASCET criteria, using the distal internal carotid diameter as the denominator. CONTRAST:  50 cc Isovue 370 COMPARISON:  Head CT same day FINDINGS: CTA NECK FINDINGS Aortic arch: Aortic atherosclerosis. No aneurysm or dissection. Branching pattern of the brachiocephalic vessels from the arch is normal.  Atherosclerotic disease at the brachiocephalic vessel origins but without stenosis greater than about 20%. Right carotid system: Common carotid artery shows atherosclerotic disease with narrowing in the mid portion of 40%. Occlusion of the right ICA at the origin. No antegrade flow in the ICA at the skullbase. Left carotid system: Common carotid artery widely patent to the bifurcation region. Motion degradation at the bifurcation. Streak artifact at the bifurcation. Atherosclerotic disease with soft and calcified plaque. Stenosis of the proximal ICA estimated at 50%. Motion degradation in the cervical ICA region without suspicion of flow limiting stenosis in that region. Vertebral arteries: Right vertebral artery origin widely patent. No subclavian stenosis proximal or distal to that. Right vertebral artery is widely patent through the cervical region to the foramen magnum.  Left subclavian artery shows a focal way lobe or localized dissection just proximal to the left vertebral artery origin. There is severe stenosis at the left vertebral artery origin, 80% or greater. The vessel is patent beyond [facility] through the cervical region to the foramen magnum. Skeleton: Ordinary mid cervical spondylosis. Other neck: No mass or lymphadenopathy. Benign appearing sebaceous cyst of the right lower neck. Upper chest: Extensive upper lobe emphysema with pleural and parenchymal scarring. Regions of scarring appear masslike and are progressive since the most recent chest CT of 05/05/2010. Depending on the clinical status, complete chest CT would be suggested if the patient is a candidate for treatment of pulmonary disease. Review of the MIP images confirms the above findings CTA HEAD FINDINGS Anterior circulation: Left internal carotid artery is patent through the skullbase and siphon regions. There is some external to internal collateral flow with reconstitution of the distal siphon and supraclinoid ICA on the right. There are also patent anterior and posterior communicating arteries. No ACA or MCA stenosis. No aneurysm or vascular malformation. Posterior circulation: Both vertebral arteries are patent at the foramen magnum level. Flow is present in both posterior inferior cerebellar artery is presently. Both vertebral artery supply the basilar. No basilar stenosis. Left anterior inferior cerebellar artery, superior cerebellar arteries and posterior cerebral arteries are patent bilaterally without evidence of flow limiting stenosis or missing components. It is not possible to determine if the right anterior inferior cerebellar artery is occluded or absent developmentally. Venous sinuses: Patent and normal Anatomic variants: None other significant Delayed phase: No abnormal enhancement Review of the MIP images confirms the above findings IMPRESSION: Advanced atherosclerotic disease of the thoracic  aorta but without evidence of aneurysm or dissection. Atherosclerotic disease at the brachiocephalic vessel origins but without stenosis greater than 20%. 40% stenosis in the midportion of the right common carotid artery. Complete occlusion of the right internal carotid artery at its origin. Atherosclerotic disease at the left carotid bifurcation. Detail is limited by artifact and motion. Stenosis is estimated at 50% at the proximal ICA. Right vertebral artery widely patent through the neck. Web or focal dissection of the left subclavian artery just proximal to the left vertebral artery origin. Severe stenosis of the left vertebral artery origin, 80% or greater. The vessel is widely patent beyond that. Both posterior inferior cerebellar arteries are patent presently. No visible anterior inferior cerebellar artery on the right. This could be occluded or congenitally absent. Right ICA reconstitution in the supraclinoid region secondary to external to internal collaterals, patent anterior communicating artery and patent posterior communicating artery. Parenchymal distributions of the anterior and middle cerebral vessels appear intact. Advanced chronic lung disease with advanced emphysema. Pleural and parenchymal scarring at both apices, with increased  density compared to the CT chest of 2011. I cannot rule out the possibility of tumor in this region, though this may merely be progressive scarring. If the patient is a candidate for treatment pulmonary disease, complete chest CT would be warranted. Electronically Signed   By: Nelson Chimes M.D.   On: 02/06/2017 14:33   Daniel Brain Wo Contrast  Result Date: 02/06/2017 CLINICAL DATA:  83 y/o  M; follow-up of stroke. EXAM: MRI HEAD WITHOUT CONTRAST MRA HEAD WITHOUT CONTRAST TECHNIQUE: Multiplanar, multiecho pulse sequences of the brain and surrounding structures were obtained without intravenous contrast. Angiographic images of the head were obtained using MRA technique  without contrast. COMPARISON:  02/06/2017 CT of the head and CTA of the head. FINDINGS: MRI HEAD FINDINGS Brain: Reduced diffusion in the right inferior cerebellum. Associated T2 hyperintense signal abnormality and mild local mass effect compatible with late acute/ early subacute infarction. Unsure diffusion signal abnormality in the brain. No abnormal susceptibility hypointensity to indicate intracranial hemorrhage. No hydrocephalus or effacement of basilar cisterns. Background of few scattered nonspecific foci of T2 FLAIR hyperintense signal abnormality in subcortical and periventricular white matter is compatible with mild chronic microvascular ischemic changes for age. Mild brain parenchymal volume loss. Vascular: As below. Skull and upper cervical spine: Normal marrow signal. Sinuses/Orbits: Mild right maxillary sinus mucosal thickening. Bilateral intra-ocular lens replacement. No abnormal signal of mastoid air cells Other: None. MRA HEAD FINDINGS Internal carotid arteries: Patent left internal carotid artery. Mild irregularity of the cavernous segment without stenosis compatible with intracranial atherosclerosis. The right internal carotid artery is occluded to the paraclinoid segment where it is reconstituted to the terminus. Anterior cerebral arteries:  Patent. Middle cerebral arteries: Patent. Anterior communicating artery: Patent. Posterior communicating arteries: Large right and possible diminutive left posterior communicating arteries. Posterior cerebral arteries:  Patent. Basilar artery:  Patent. Vertebral arteries:  Patent. No evidence of high-grade stenosis, large vessel occlusion, or aneurysm unless noted above. IMPRESSION: 1. Right inferior cerebellum late acute/early subacute infarction stable in distribution in comparison with prior CT of the head given differences in technique. No acute hemorrhage. 2. Stable background of mild chronic microvascular ischemic changes and mild parenchymal volume  loss of the brain. 3. Right ICA occlusion with reconstitution at the paraclinoid segment to the terminus. Large right posterior communicating artery. 4. Patent circle of Willis. No large vessel occlusion, aneurysm, or significant stenosis is identified. Electronically Signed   By: Kristine Garbe M.D.   On: 02/06/2017 19:40   Dg Swallowing Func-speech Pathology  Result Date: 02/07/2017 Objective Swallowing Evaluation: Type of Study: MBS-Modified Barium Swallow Study Patient Details Name: Daniel Chapman MRN: [id number] Date of Birth: 12-Apr-1932 Today's Date: 02/07/2017 Time: SLP Start Time (ACUTE ONLY): 0819-SLP Stop Time (ACUTE ONLY): 0845 SLP Time Calculation (min) (ACUTE ONLY): 26 min Past Medical History: Past Medical History: Diagnosis Date . Carcinoma of vocal cord [facility]) 2008  radiation therapy . Carotid artery occlusion  . COPD (chronic obstructive pulmonary disease) ([place])  . Fall due to ice or snow Feb. 22, 2015  Partial Right Hip replaced . History of ITP  . Hx of colonic polyps  . Hyperlipemia  . Osteopenia   ? [facility] Kitchen Osteoporosis   dexa 06, declines further dexa or tx  . Smoker  Past Surgical History: Past Surgical History: Procedure Laterality Date . BONE MARROW BIOPSY  05/2003 . CATARACT EXTRACTION   . Dexa    per patient ? osteopenia . HEMORRHOID SURGERY   . JOINT REPLACEMENT Right Feb. 22,  2015  Partial Hip . SPLENECTOMY   . TONSILLECTOMY AND ADENOIDECTOMY Bilateral age 82 per patient HPI: Daniel Chapman is an 83 y.o.male with h/o HLD, vocal cord cancer, tobacco use and COPDwho presented to the ED today for evaluation of new onset dizziness and incoordination. Symptoms also included difficulty speaking, headache, nausea and vomiting. He was found by his wife on the floor beside vomit and a coffee pot he knocked down. Initially pt was unable to speak, but eventually he was able to state that he was hit with "a bout of dizziness. He has vomited three times since Sunday.   Pt found to have cerebellar  CVA.  Swallow evaluation ordered, given h/o vocal fold cancer s/p XRT, SLP to proceed with MBS.  Subjective: pt awake in chair Assessment / Plan / Recommendation CHL IP CLINICAL IMPRESSIONS 02/07/2017 Clinical Impression Baseline coughing and expectorating secretions noted.  Pt presents with moderate pharyngeal dysphagia - suspect exacerbation of baseline deficits *known h/o dysphagia from vocal cord cancer s/p XRT.  Decreased pharyngeal contraction/tongue base retraction and epiglottic deflection results in residuals (vallecular worse than pyriform sinus) without pt consistent awareness.   Cued dry swallow helpful to decrease residuals that pt does NOT sense and following solid with liquid helpful.  Pt suspected to aspirate with consumption of thin via straw *although missed actual swallow on flouro*.  Pt overtly coughed immediately postswallow of thin via straw - with at least laryngeal penetration observed.  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  Cough with backflow of barium into pharynx and vomiting observed - suspect due to cerebellar cva.  NO aspiration of emesis noted.  Recommend start clear liquid diet with strict precautions.  Using teach back, pt was educated to findings/recommendations thoroughly.   SLP Visit Diagnosis Dysphagia, oropharyngeal phase (R13.12) Attention and concentration deficit following -- Frontal lobe and executive function deficit following -- Impact on safety and function Moderate aspiration risk   CHL IP TREATMENT RECOMMENDATION 02/07/2017 Treatment Recommendations F/U MBS in --- days (Comment)   Prognosis 02/07/2017 Prognosis for Safe Diet Advancement Fair Barriers to Reach Goals Severity of deficits Barriers/Prognosis Comment -- CHL IP DIET RECOMMENDATION 02/07/2017 SLP Diet Recommendations Thin liquid Liquid Administration via Cup;No straw Medication Administration Other (Comment) Compensations Small sips/bites;Slow rate;Follow  solids with liquid;Multiple dry swallows after each bite/sip;Other (Comment) Postural Changes Seated upright at 90 degrees;Remain semi-upright after after feeds/meals (Comment)   CHL IP OTHER RECOMMENDATIONS 02/07/2017 Recommended Consults -- Oral Care Recommendations Oral care before and after PO Other Recommendations --   CHL IP FOLLOW UP RECOMMENDATIONS 02/07/2017 Follow up Recommendations None   CHL IP FREQUENCY AND DURATION 02/07/2017 Speech Therapy Frequency (ACUTE ONLY) min 2x/week Treatment Duration 2 weeks      CHL IP ORAL PHASE 02/07/2017 Oral Phase Impaired Oral - Pudding Teaspoon -- Oral - Pudding Cup -- Oral - Honey Teaspoon -- Oral - Honey Cup -- Oral - Nectar Teaspoon -- Oral - Nectar Cup -- Oral - Nectar Straw -- Oral - Thin Teaspoon WFL Oral - Thin Cup WFL Oral - Thin Straw Premature spillage Oral - Puree WFL Oral - Mech Soft WFL Oral - Regular -- Oral - Multi-Consistency -- Oral - Pill -- Oral Phase - Comment --  CHL IP PHARYNGEAL PHASE 02/07/2017 Pharyngeal Phase Impaired Pharyngeal- Pudding Teaspoon -- Pharyngeal -- Pharyngeal- Pudding Cup -- Pharyngeal -- Pharyngeal- Honey Teaspoon -- Pharyngeal -- Pharyngeal- Honey Cup -- Pharyngeal -- Pharyngeal- Nectar Teaspoon Reduced pharyngeal peristalsis;Reduced epiglottic  inversion;Reduced anterior laryngeal mobility;Reduced laryngeal elevation;Reduced airway/laryngeal closure;Reduced tongue base retraction;Pharyngeal residue - valleculae;Pharyngeal residue - pyriform Pharyngeal -- Pharyngeal- Nectar Cup Reduced anterior laryngeal mobility;Reduced laryngeal elevation;Reduced epiglottic inversion;Reduced pharyngeal peristalsis;Reduced tongue base retraction;Pharyngeal residue - valleculae;Pharyngeal residue - pyriform Pharyngeal -- Pharyngeal- Nectar Straw -- Pharyngeal -- Pharyngeal- Thin Teaspoon Reduced pharyngeal peristalsis;Reduced epiglottic inversion;Reduced anterior laryngeal mobility;Reduced laryngeal elevation;Reduced airway/laryngeal closure;Reduced  tongue base retraction;Pharyngeal residue - valleculae;Pharyngeal residue - pyriform Pharyngeal -- Pharyngeal- Thin Cup Reduced pharyngeal peristalsis;Reduced epiglottic inversion;Reduced anterior laryngeal mobility;Reduced laryngeal elevation;Reduced airway/laryngeal closure;Reduced tongue base retraction;Pharyngeal residue - valleculae;Pharyngeal residue - pyriform Pharyngeal -- Pharyngeal- Thin Straw Reduced pharyngeal peristalsis;Reduced epiglottic inversion;Reduced anterior laryngeal mobility;Reduced laryngeal elevation;Reduced airway/laryngeal closure;Reduced tongue base retraction;Penetration/Aspiration during swallow Pharyngeal Material enters airway, passes BELOW cords and not ejected out despite cough attempt by patient Pharyngeal- Puree Reduced pharyngeal peristalsis;Reduced epiglottic inversion;Reduced tongue base retraction;Pharyngeal residue - valleculae Pharyngeal -- Pharyngeal- Mechanical Soft Reduced pharyngeal peristalsis;Reduced epiglottic inversion;Reduced tongue base retraction;Pharyngeal residue - valleculae Pharyngeal -- Pharyngeal- Regular -- Pharyngeal -- Pharyngeal- Multi-consistency -- Pharyngeal -- Pharyngeal- Pill -- Pharyngeal -- Pharyngeal Comment CUED DRY SWALLOWS helpful to decrease residuals, "hock" to clear helpful but not fully effective  CHL IP CERVICAL ESOPHAGEAL PHASE 02/07/2017 Cervical Esophageal Phase Impaired Pudding Teaspoon -- Pudding Cup -- Honey Teaspoon -- Honey Cup -- Nectar Teaspoon -- Nectar Cup -- Nectar Straw -- Thin Teaspoon -- Thin Cup -- Thin Straw -- Puree -- Mechanical Soft -- Regular -- Multi-consistency -- Pill -- Cervical Esophageal Comment pt with strong coughing noted resulting in backflow of barium into pharynx and caused vomiting, upon esophageal sweep at end of MBS - pt appeared with clear esophagus No flowsheet data found. Daniel Chapman 02/07/2017, 10:07 AM  Luanna Salk, MS [facility] SLP [phone]             Daniel Chapman Head Wo Contrast  Result Date:  02/06/2017 CLINICAL DATA:  83 y/o  M; follow-up of stroke. EXAM: MRI HEAD WITHOUT CONTRAST MRA HEAD WITHOUT CONTRAST TECHNIQUE: Multiplanar, multiecho pulse sequences of the brain and surrounding structures were obtained without intravenous contrast. Angiographic images of the head were obtained using MRA technique without contrast. COMPARISON:  02/06/2017 CT of the head and CTA of the head. FINDINGS: MRI HEAD FINDINGS Brain: Reduced diffusion in the right inferior cerebellum. Associated T2 hyperintense signal abnormality and mild local mass effect compatible with late acute/ early subacute infarction. Unsure diffusion signal abnormality in the brain. No abnormal susceptibility hypointensity to indicate intracranial hemorrhage. No hydrocephalus or effacement of basilar cisterns. Background of few scattered nonspecific foci of T2 FLAIR hyperintense signal abnormality in subcortical and periventricular white matter is compatible with mild chronic microvascular ischemic changes for age. Mild brain parenchymal volume loss. Vascular: As below. Skull and upper cervical spine: Normal marrow signal. Sinuses/Orbits: Mild right maxillary sinus mucosal thickening. Bilateral intra-ocular lens replacement. No abnormal signal of mastoid air cells Other: None. MRA HEAD FINDINGS Internal carotid arteries: Patent left internal carotid artery. Mild irregularity of the cavernous segment without stenosis compatible with intracranial atherosclerosis. The right internal carotid artery is occluded to the paraclinoid segment where it is reconstituted to the terminus. Anterior cerebral arteries:  Patent. Middle cerebral arteries: Patent. Anterior communicating artery: Patent. Posterior communicating arteries: Large right and possible diminutive left posterior communicating arteries. Posterior cerebral arteries:  Patent. Basilar artery:  Patent. Vertebral arteries:  Patent. No evidence of high-grade stenosis, large vessel occlusion, or  aneurysm unless noted above. IMPRESSION: 1. Right inferior cerebellum late acute/early subacute infarction stable  in distribution in comparison with prior CT of the head given differences in technique. No acute hemorrhage. 2. Stable background of mild chronic microvascular ischemic changes and mild parenchymal volume loss of the brain. 3. Right ICA occlusion with reconstitution at the paraclinoid segment to the terminus. Large right posterior communicating artery. 4. Patent circle of Willis. No large vessel occlusion, aneurysm, or significant stenosis is identified. Electronically Signed   By: Kristine Garbe M.D.   On: 02/06/2017 19:40    Lab Data:  CBC:  Recent Labs Lab 02/06/17 0947 02/06/17 1009 02/08/17 0325  WBC 9.9  --  10.0  NEUTROABS 7.2  --   --   HGB 13.0 13.9 12.8*  HCT 40.4 41.0 38.4*  MCV 90.4  --  89.1  PLT 224  --  376   Basic Metabolic Panel:  Recent Labs Lab 02/06/17 0947 02/06/17 1009 02/08/17 0325  NA 137 138 135  K 4.1 4.1 3.6  CL 104 103 105  CO2 24  --  24  GLUCOSE 129* 128* 102*  BUN _0 CREATININE 0.89 0.80 0.76  CALCIUM 9.3  --  8.8*   GFR: Estimated Creatinine Clearance: 56 mL/min (by C-G formula based on SCr of 0.76 mg/dL). Liver Function Tests:  Recent Labs Lab 02/06/17 0947  AST 29  ALT 17  ALKPHOS 52  BILITOT 1.0  PROT 7.4  ALBUMIN 3.7   No results for input(s): LIPASE, AMYLASE in the last 168 hours. No results for input(s): AMMONIA in the last 168 hours. Coagulation Profile:  Recent Labs Lab 02/06/17 0947  INR 1.03   Cardiac Enzymes: No results for input(s): CKTOTAL, CKMB, CKMBINDEX, TROPONINI in the last 168 hours. BNP (last 3 results) No results for input(s): PROBNP in the last 8760 hours. HbA1C:  Recent Labs  02/06/17 0947  HGBA1C 6.2*   CBG:  Recent Labs Lab 02/06/17 1004  GLUCAP 123*   Lipid Profile:  Recent Labs  02/06/17 0947  CHOL 148  HDL 29*  LDLCALC 96  TRIG 116  CHOLHDL 5.1     Thyroid Function Tests:  Recent Labs  02/06/17 1522  TSH 0.722   Anemia Panel: No results for input(s): VITAMINB12, FOLATE, FERRITIN, TIBC, IRON, RETICCTPCT in the last 72 hours. Urine analysis: No results found for: COLORURINE, APPEARANCEUR, LABSPEC, PHURINE, GLUCOSEU, HGBUR, BILIRUBINUR, KETONESUR, PROTEINUR, UROBILINOGEN, NITRITE, LEUKOCYTESUR   Merlyn Conley M.D. [facility] 02/08/2017, 12:18 PM  Pager: [phone] Between 7am to 7pm - call Pager - [phone]  After 7pm go to [email or web address] - password [email or web address]  Call night coverage person covering after 7pm

## 2017-02-08 NOTE — Care Management Note (Signed)
Case Management Note  Patient Details  Name: Daniel Chapman MRN: [id number] Date of Birth: [birth year]  Subjective/Objective:    Pt admitted with CVA. He is from home with his spouse.                 Action/Plan: PT/OT recommending CIR. CM following for d/c disposition.   Expected Discharge Date:                  Expected Discharge Plan:  [place]  In-House Referral:     Discharge planning Services  CM Consult  Post Acute Care Choice:    Choice offered to:     DME Arranged:    DME Agency:     HH Arranged:    [place] Agency:     Status of Service:  In process, will continue to follow  If discussed at Long Length of Stay Meetings, dates discussed:    Additional Comments:  Pollie Friar, RN 02/08/2017, 11:42 AM

## 2017-02-08 NOTE — Progress Notes (Signed)
  Speech Language Pathology Treatment: Dysphagia  Patient Details Name: MORTIMER BAIR MRN: [id number] DOB: [birth year] Today's Date: 02/08/2017 Time: 4920-1007 SLP Time Calculation (min) (ACUTE ONLY): 29 min  Assessment / Plan / Recommendation Clinical Impression  Per discussion with RN, pt tolerating po intake well.  Pt reports a small episode of vomiting this am with movement.  SLP observed pt with lunch for education purposes, tolerance and to determine readiness for dietary advancement. Reviewed MBS with pt showing him flouro loops = to better establish understanding due to sensory deficits from XRT.  Pt benefited from min to mod cues to implement dry swallows - although he easily states them independently!   Cough x3 noted during entire meal with pt expectorating clear viscous secretions x1.  Secretion management is improved compared to yesterday when he was continuously coughing/expectorating.  SLP recommends to advance diet and follow strict compensation strategies/precautions.  Pt unable to state for certain if his subtle cough x3 during po is baseline -and no family present to ask.   SLP uncertain to level of exacerbation of dysphagia with cerebellar event.   Pt will benefit from aggressive dysphagia treatment on CIR- ? RMST to maximize swallow recovery and airway protection.     HPI HPI: Mr. Hillery is an 83 y.o.male with h/o HLD, vocal cord cancer, tobacco use and COPDwho presented to the ED today for evaluation of new onset dizziness and incoordination. Symptoms also included difficulty speaking, headache, nausea and vomiting. He was found by his wife on the floor beside vomit and a coffee pot he knocked down. Initially pt was unable to speak, but eventually he was able to state that he was hit with "a bout of dizziness. He has vomited three times since Sunday.   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  Swallow evaluation ordered, given h/o vocal fold cancer s/p XRT, SLP to proceed with MBS.        SLP Plan  Continue with current plan of care       Recommendations  Diet recommendations: Thin liquid;Nectar-thick liquid (full liquid) Liquids provided via: Cup;No straw Medication Administration: Crushed with puree Supervision: Patient able to self feed Compensations: Small sips/bites;Multiple dry swallows after each bite/sip;Effortful swallow (stop po intake if coughing excessively due to aspiration) Postural Changes and/or Swallow Maneuvers: Seated upright 90 degrees;Upright 30-60 min after meal                Oral Care Recommendations: Oral care QID Follow up Recommendations: Inpatient Rehab SLP Visit Diagnosis: Cognitive communication deficit (H21.975) Plan: Continue with current plan of care       [employer], [place] [facility] SLP [phone]

## 2017-02-08 NOTE — Progress Notes (Signed)
F/u scheduled with Lyda Jester 02/23/17 at 11am, appt placed on AVS. Huie Ghuman PA-C

## 2017-02-09 DIAGNOSIS — I6521 Occlusion and stenosis of right carotid artery: Secondary | ICD-10-CM

## 2017-02-09 NOTE — Progress Notes (Signed)
Occupational Therapy Treatment Patient Details Name: Daniel Chapman MRN: [id number] DOB: 1931/07/31 Today's Date: 02/09/2017    History of present illness 83 y.o. male with medical history significant for COPD not on home oxygen, tobacco use, vocal cord cancer, ITP, hyperlipidemia, carotid artery occlusion presents to emergency Department chief complaint dizziness & fall. MRI + R inferior  cerebellum infarct.   OT comments  Pt progressing towards acute OT goals. Focus of session was functional transfers and dynamic balance during functional task in standing. Continue to feel pt would be great candidate for CIR.    Follow Up Recommendations  CIR;Supervision/Assistance - 24 hour    Equipment Recommendations  3 in 1 bedside commode;Tub/shower bench    Recommendations for Other Services      Precautions / Restrictions Precautions Precautions: Fall Restrictions Weight Bearing Restrictions: No       Mobility Bed Mobility Overal bed mobility: Needs Assistance Bed Mobility: Supine to Sit     Supine to sit: Supervision     General bed mobility comments: Pt able to move from supine to sit with no increased time or effort, but supervision for safety.  Transfers Overall transfer level: Needs assistance Equipment used: None Transfers: Sit to/from [facility] Sit to Stand: Min assist Stand pivot transfers: Min assist       General transfer comment: stabilized rw during standing with pt using BUE on rw to pull up to standing. Assist to steady balance.     Balance Overall balance assessment: Needs assistance Sitting-balance support: Feet supported;No upper extremity supported Sitting balance-Leahy Scale: Fair     Standing balance support: During functional activity;Single extremity supported;Bilateral upper extremity supported Standing balance-Leahy Scale: Poor Standing balance comment: stood for pericare                           ADL either  performed or assessed with clinical judgement   ADL Overall ADL's : Needs assistance/impaired Eating/Feeding: Supervision/ safety;Set up;Sitting Eating/Feeding Details (indicate cue type and reason): sitting up in bed drinking milk upon OT arrival. Coughing noted. Encouraged pt to cough strongly and swallow 2x.                          Toileting- Clothing Manipulation and Hygiene: Minimal assistance;Sit to/from stand Toileting - Clothing Manipulation Details (indicate cue type and reason): Pt stood with rw to complete pericare cleaning with washcloth. Assist for through completion of task.     Functional mobility during ADLs: Moderate assistance;Rolling walker General ADL Comments: Pt completed bed mobility, stood EOB about 5 minutes during pericare and start of gown change. Pt reporting some dizziness in standing. Encouraged pt to sit down for remaineder of gown change.      Vision       Perception     Praxis      Cognition Arousal/Alertness: Awake/alert Behavior During Therapy: WFL for tasks assessed/performed Overall Cognitive Status: Impaired/Different from baseline Area of Impairment: Safety/judgement;Awareness                         Safety/Judgement: Decreased awareness of safety;Decreased awareness of deficits Awareness: Emergent   General Comments: Somewhat limited vocabulary during responses. Pt jovial/joking a lot with responses. Limited repetoire of phrases for responses "Does a cat have a tail?" "Whatever you want." Pleasantly confused.        Exercises     Shoulder Instructions  General Comments      Pertinent Vitals/ Pain       Pain Assessment: No/denies pain  Home Living                                          Prior Functioning/Environment              Frequency  Min 2X/week        Progress Toward Goals  OT Goals(current goals can now be found in the care plan section)  Progress towards OT  goals: Progressing toward goals  Acute Rehab OT Goals Patient Stated Goal: return to prior level of independence OT Goal Formulation: With patient Time For Goal Achievement: 02/21/17 Potential to Achieve Goals: Good ADL Goals Pt Will Perform Upper Body Bathing: with modified independence;sitting Pt Will Perform Lower Body Bathing: with supervision;sit to/from stand;with caregiver independent in assisting Pt Will Perform Upper Body Dressing: with set-up;sitting Pt Will Perform Lower Body Dressing: with supervision;sit to/from stand;with caregiver independent in assisting Pt Will Transfer to Toilet: with supervision;ambulating Pt Will Perform Toileting - Clothing Manipulation and hygiene: with supervision;sit to/from stand Additional ADL Goal #1: Pt will demonstrate use of gaze stabilization compensatory strategies dueint functional mobility with min vc  Plan Discharge plan remains appropriate    Co-evaluation                 AM-PAC PT "6 Clicks" Daily Activity     Outcome Measure   Help from another person eating meals?: A Little Help from another person taking care of personal grooming?: A Little Help from another person toileting, which includes using toliet, bedpan, or urinal?: A Little Help from another person bathing (including washing, rinsing, drying)?: A Little Help from another person to put on and taking off regular upper body clothing?: A Little Help from another person to put on and taking off regular lower body clothing?: A Lot 6 Click Score: 17    End of Session Equipment Utilized During Treatment: Rolling walker  OT Visit Diagnosis: Other abnormalities of gait and mobility (R26.89);History of falling (Z91.81);Dizziness and giddiness (R42)   Activity Tolerance Patient tolerated treatment well   Patient Left in chair;with call bell/phone within reach;with chair alarm set   Nurse Communication          Time: 8413-2440 OT Time Calculation (min): 22  min  Charges: OT General Charges $OT Visit: 1 Visit OT Treatments $Self Care/Home Management : 8-22 mins     Hortencia Pilar 02/09/2017, 1:27 PM

## 2017-02-09 NOTE — Progress Notes (Signed)
[facility]                                                                              Patient Demographics  Daniel Chapman, is a 83 y.o. male, DOB - [birth year], KMQ:[id number]  Admit date - 02/06/2017   Admitting Physician Waldemar Dickens, MD  Outpatient Primary MD for the patient is Tower, [name], MD  Outpatient specialists:   LOS - 3  days   Medical records reviewed and are as summarized below:    Chief Complaint  Patient presents with  . Dizziness       Brief summary   HPI on 02/06/2017 by Ms. [name], NP Daniel Chapman a 83 y.o.malewith medical history significant for COPD not on home oxygen, tobacco use, vocal cord cancer, ITP, hyperlipidemia, carotid artery occlusion presents toemergency Department chief complaint dizziness fall yesterday. Initial evaluation reveals afib new onset andincludes a CT of the headrevealing Subacute and completed infarction of the right posterior inferior cerebellar territory. No intracranial hemorrhage, with minimal local mass effect secondary to edema.   Information is obtained from the patient and his wife is at the bedside. Patient is unable to articulate factly what happened yesterday. He can say that he became dizzy and fell down. He then got himself up to the chair. He does not remember vomiting but wife says when she came home from the grocery store she found him sitting in the chair with emesis on his shirt. In addition she found the house" disarray". Patient denies any headache visual disturbances numbness tingling of extremities. He denies chest pain palpitations shortness of breath. He denies nausea and does not remember vomiting. He denies diaphoresis lower extremity edema.He denies any fever chills recent travel or sick contacts. He denies dysuria hematuria frequency or urgency. He denies diarrhea constipation melena bright red blood per rectum. Wife states he appeared to have difficulty talking initially but  that improved as the evening wore on. He slept through the night without any problems. She called the PCP this morning to report what happened and they recommend she come to the emergency department   Assessment & Plan    Principal Problem: Acute CVA - CT head showed subacute and completed infarction of the right posterior inferior cerebellar territory. No intracranial hemorrhage with minimal local mass effect secondary to edema. - MRI/MRA showed right inferior cerebellum with late acute/early subacute infarction. Right ICA occlusion with reconstitution at the paraclinoid segment - 2-D echo with EF of 65-70% - CT angiogram of the head and neck showed advanced atherosclerotic disease of the thoracic aorta, no aneurysm or dissection, 40% stenosis in the midportion of right common carotid artery, complete occlusion of the right ICA adnexal region. Webb or focal dissection of the left subclavian artery just proximal to left vertebral artery region. Severe stenosis of the left vertebral artery lesion 80% or greater. - LDL 96, hemoglobin A1c 6.2 - Per neurology, continue Plavix, statin. Aspirin held secondary to history of ITP - PT recommended CIR, awaiting bed -Seen by speech therapist, placed on a liquid diet   Active Problems:    COPD (chronic obstructive pulmonary disease) (  [place]), tobacco abuse - CT angiogram of the head and neck showed advanced chronic lung disease with advanced emphysema, pleural and parenchymal scarring at both apices with increased density compared to CT chest of 2011 cannot rule out the possibility of tumor in this region though this may be progressive scarring. Complete chest CT warranted. - Currently no wheezing, stable  New onset atrial fibrillation - Currently on Plavix  - Given patient's size of stroke, neurology recommended waiting 7 days before starting anticoagulation, eliquis in 7 days, on 02/13/17  - once eliquis started, Plavix can be discontinued  History of  ITP - Status post splenectomy, recommended to avoid aspirin  Right ICA chronic occlusion - Followed by vascular surgery as outpatient, BP goal 130 to 150  Hyperlipidemia - LDL 96, goal less than 70, started on Crestor 10 mg daily   Code Status: full  DVT Prophylaxis:  Lovenox  Family Communication: Discussed in detail with the patient, all imaging results, lab results explained to the patient and wife    Disposition Plan: Awaiting CIR  Time Spent in minutes   25 minutes  Procedures:  MRI, MRA CT angio  Consultants:   Neuro   Antimicrobials:      Medications  Scheduled Meds: . clopidogrel  75 mg Oral Daily  . enoxaparin (LOVENOX) injection  40 mg Subcutaneous Q24H  . rosuvastatin  10 mg Oral q1800   Continuous Infusions: PRN Meds:.acetaminophen **OR** acetaminophen (TYLENOL) oral liquid 160 mg/5 mL **OR** acetaminophen, ondansetron (ZOFRAN) IV, senna-docusate   Antibiotics   Anti-infectives    None        Subjective:   Daniel Chapman was seen and examined today. No new complaints today. Patient denies dizziness, chest pain, shortness of breath, abdominal pain, N/V/D/C, new weakness, numbess, tingling. No acute events overnight.    Objective:   Vitals:   02/08/17 1826 02/08/17 2100 02/09/17 0500 02/09/17 0903  BP: 131/84 114/81 122/67 (!) 107/56  Pulse: 78 83 86 83  Resp: '20 20 19 19  ' Temp: 98.3 F (36.8 C) 97.7 F (36.5 C) 98.1 F (36.7 C) 98.5 F (36.9 C)  TempSrc: Oral Oral Oral Oral  SpO2: 99% 99% 99% 99%  Weight:      Height:        Intake/Output Summary (Last 24 hours) at 02/09/17 1359 Last data filed at 02/08/17 2100  Gross per 24 hour  Intake                0 ml  Output              200 ml  Net             -200 ml     Wt Readings from Last 3 Encounters:  02/06/17 56.6 kg (124 lb 11.2 oz)  01/03/17 57.3 kg (126 lb 4 oz)  12/27/16 57.5 kg (126 lb 12.8 oz)     Exam   General: Alert and oriented x 3, NAD  Eyes:   HEENT:   Atraumatic, normocephalic  Cardiovascular: S1 S2 auscultated, no rubs, murmurs or gallops. Regular rate and rhythm. No pedal edema b/l  Respiratory: Clear to auscultation bilaterally, no wheezing, rales or rhonchi  Gastrointestinal: Soft, nontender, nondistended, + bowel sounds  Ext: no pedal edema bilaterally  Neuro: no new deficits  Musculoskeletal: No digital cyanosis, clubbing  Skin: No rashes  Psych: Normal affect and demeanor, alert and oriented x3    Data Reviewed:  I have personally reviewed following labs and imaging  studies  Micro Results No results found for this or any previous visit (from the past 240 hour(s)).  Radiology Reports Ct Angio Head W Or Wo Contrast  Result Date: 02/06/2017 CLINICAL DATA:  Acute right cerebellar infarction. EXAM: CT ANGIOGRAPHY HEAD AND NECK TECHNIQUE: Multidetector CT imaging of the head and neck was performed using the standard protocol during bolus administration of intravenous contrast. Multiplanar CT image reconstructions and MIPs were obtained to evaluate the vascular anatomy. Carotid stenosis measurements (when applicable) are obtained utilizing NASCET criteria, using the distal internal carotid diameter as the denominator. CONTRAST:  50 cc Isovue 370 COMPARISON:  Head CT same day FINDINGS: CTA NECK FINDINGS Aortic arch: Aortic atherosclerosis. No aneurysm or dissection. Branching pattern of the brachiocephalic vessels from the arch is normal. Atherosclerotic disease at the brachiocephalic vessel origins but without stenosis greater than about 20%. Right carotid system: Common carotid artery shows atherosclerotic disease with narrowing in the mid portion of 40%. Occlusion of the right ICA at the origin. No antegrade flow in the ICA at the skullbase. Left carotid system: Common carotid artery widely patent to the bifurcation region. Motion degradation at the bifurcation. Streak artifact at the bifurcation. Atherosclerotic disease with soft and  calcified plaque. Stenosis of the proximal ICA estimated at 50%. Motion degradation in the cervical ICA region without suspicion of flow limiting stenosis in that region. Vertebral arteries: Right vertebral artery origin widely patent. No subclavian stenosis proximal or distal to that. Right vertebral artery is widely patent through the cervical region to the foramen magnum. Left subclavian artery shows a focal way lobe or localized dissection just proximal to the left vertebral artery origin. There is severe stenosis at the left vertebral artery origin, 80% or greater. The vessel is patent beyond [facility] through the cervical region to the foramen magnum. Skeleton: Ordinary mid cervical spondylosis. Other neck: No mass or lymphadenopathy. Benign appearing sebaceous cyst of the right lower neck. Upper chest: Extensive upper lobe emphysema with pleural and parenchymal scarring. Regions of scarring appear masslike and are progressive since the most recent chest CT of 05/05/2010. Depending on the clinical status, complete chest CT would be suggested if the patient is a candidate for treatment of pulmonary disease. Review of the MIP images confirms the above findings CTA HEAD FINDINGS Anterior circulation: Left internal carotid artery is patent through the skullbase and siphon regions. There is some external to internal collateral flow with reconstitution of the distal siphon and supraclinoid ICA on the right. There are also patent anterior and posterior communicating arteries. No ACA or MCA stenosis. No aneurysm or vascular malformation. Posterior circulation: Both vertebral arteries are patent at the foramen magnum level. Flow is present in both posterior inferior cerebellar artery is presently. Both vertebral artery supply the basilar. No basilar stenosis. Left anterior inferior cerebellar artery, superior cerebellar arteries and posterior cerebral arteries are patent bilaterally without evidence of flow limiting  stenosis or missing components. It is not possible to determine if the right anterior inferior cerebellar artery is occluded or absent developmentally. Venous sinuses: Patent and normal Anatomic variants: None other significant Delayed phase: No abnormal enhancement Review of the MIP images confirms the above findings IMPRESSION: Advanced atherosclerotic disease of the thoracic aorta but without evidence of aneurysm or dissection. Atherosclerotic disease at the brachiocephalic vessel origins but without stenosis greater than 20%. 40% stenosis in the midportion of the right common carotid artery. Complete occlusion of the right internal carotid artery at its origin. Atherosclerotic disease at the left carotid  bifurcation. Detail is limited by artifact and motion. Stenosis is estimated at 50% at the proximal ICA. Right vertebral artery widely patent through the neck. Web or focal dissection of the left subclavian artery just proximal to the left vertebral artery origin. Severe stenosis of the left vertebral artery origin, 80% or greater. The vessel is widely patent beyond that. Both posterior inferior cerebellar arteries are patent presently. No visible anterior inferior cerebellar artery on the right. This could be occluded or congenitally absent. Right ICA reconstitution in the supraclinoid region secondary to external to internal collaterals, patent anterior communicating artery and patent posterior communicating artery. Parenchymal distributions of the anterior and middle cerebral vessels appear intact. Advanced chronic lung disease with advanced emphysema. Pleural and parenchymal scarring at both apices, with increased density compared to the CT chest of 2011. I cannot rule out the possibility of tumor in this region, though this may merely be progressive scarring. If the patient is a candidate for treatment pulmonary disease, complete chest CT would be warranted. Electronically Signed   By: Nelson Chimes M.D.    On: 02/06/2017 14:33   Dg Chest 2 View  Result Date: 02/06/2017 CLINICAL DATA:  Stroke.  No chest complaints. EXAM: CHEST  2 VIEW COMPARISON:  07/19/2011 FINDINGS: Hyperinflation. Lateral view degraded by patient arm position. Midthoracic compression deformity is mild to moderate and similar. Numerous leads and wires project over the chest. Midline trachea. Normal heart size and mediastinal contours for age. Left costophrenic angle excluded from the frontal radiograph. No pleural effusion or pneumothorax. Biapical pleural-parenchymal scarring. Diffuse peribronchial thickening. IMPRESSION: 1.  No acute cardiopulmonary disease. 2. COPD/chronic bronchitis. Pleural-parenchymal scarring at the apices. Electronically Signed   By: Abigail Miyamoto M.D.   On: 02/06/2017 12:42   Ct Head Wo Contrast  Result Date: 02/06/2017 CLINICAL DATA:  83 year old male with a history of dizziness EXAM: CT HEAD WITHOUT CONTRAST TECHNIQUE: Contiguous axial images were obtained from the base of the skull through the vertex without intravenous contrast. COMPARISON:  None. FINDINGS: Brain: Confluent hypodense territory involving the inferior and medial right cerebellar hemisphere. No acute intracranial hemorrhage. Minimal mass effect resulting in slight narrowing of the fourth ventricle. Unremarkable configuration of the ventricles. No supratentorial hemorrhage, midline shift, mass effect. Gray-white differentiation of the anterior circulation maintained. Vascular: Calcifications of intracranial vasculature. Sagittal reformatted images demonstrate questionable hyperdensity of the vertebrobasilar junction/inferior basilar artery. Skull: No acute bony abnormality.  No aggressive bone lesion. Sinuses/Orbits: No significant atherosclerotic changes. Other: None IMPRESSION: Subacute and completed infarction of the right posterior inferior cerebellar territory. No intracranial hemorrhage, with minimal local mass effect secondary to edema.  Sagittal reformatted images demonstrate questionable hyperdensity of the vertebrobasilar junction and the inferior basilar artery, potentially representing thrombus. CT angiogram may be useful. Electronically Signed   By: Corrie Mckusick D.O.   On: 02/06/2017 11:07   Ct Angio Neck W Or Wo Contrast  Result Date: 02/06/2017 CLINICAL DATA:  Acute right cerebellar infarction. EXAM: CT ANGIOGRAPHY HEAD AND NECK TECHNIQUE: Multidetector CT imaging of the head and neck was performed using the standard protocol during bolus administration of intravenous contrast. Multiplanar CT image reconstructions and MIPs were obtained to evaluate the vascular anatomy. Carotid stenosis measurements (when applicable) are obtained utilizing NASCET criteria, using the distal internal carotid diameter as the denominator. CONTRAST:  50 cc Isovue 370 COMPARISON:  Head CT same day FINDINGS: CTA NECK FINDINGS Aortic arch: Aortic atherosclerosis. No aneurysm or dissection. Branching pattern of the brachiocephalic vessels from the arch  is normal. Atherosclerotic disease at the brachiocephalic vessel origins but without stenosis greater than about 20%. Right carotid system: Common carotid artery shows atherosclerotic disease with narrowing in the mid portion of 40%. Occlusion of the right ICA at the origin. No antegrade flow in the ICA at the skullbase. Left carotid system: Common carotid artery widely patent to the bifurcation region. Motion degradation at the bifurcation. Streak artifact at the bifurcation. Atherosclerotic disease with soft and calcified plaque. Stenosis of the proximal ICA estimated at 50%. Motion degradation in the cervical ICA region without suspicion of flow limiting stenosis in that region. Vertebral arteries: Right vertebral artery origin widely patent. No subclavian stenosis proximal or distal to that. Right vertebral artery is widely patent through the cervical region to the foramen magnum. Left subclavian artery shows  a focal way lobe or localized dissection just proximal to the left vertebral artery origin. There is severe stenosis at the left vertebral artery origin, 80% or greater. The vessel is patent beyond [facility] through the cervical region to the foramen magnum. Skeleton: Ordinary mid cervical spondylosis. Other neck: No mass or lymphadenopathy. Benign appearing sebaceous cyst of the right lower neck. Upper chest: Extensive upper lobe emphysema with pleural and parenchymal scarring. Regions of scarring appear masslike and are progressive since the most recent chest CT of 05/05/2010. Depending on the clinical status, complete chest CT would be suggested if the patient is a candidate for treatment of pulmonary disease. Review of the MIP images confirms the above findings CTA HEAD FINDINGS Anterior circulation: Left internal carotid artery is patent through the skullbase and siphon regions. There is some external to internal collateral flow with reconstitution of the distal siphon and supraclinoid ICA on the right. There are also patent anterior and posterior communicating arteries. No ACA or MCA stenosis. No aneurysm or vascular malformation. Posterior circulation: Both vertebral arteries are patent at the foramen magnum level. Flow is present in both posterior inferior cerebellar artery is presently. Both vertebral artery supply the basilar. No basilar stenosis. Left anterior inferior cerebellar artery, superior cerebellar arteries and posterior cerebral arteries are patent bilaterally without evidence of flow limiting stenosis or missing components. It is not possible to determine if the right anterior inferior cerebellar artery is occluded or absent developmentally. Venous sinuses: Patent and normal Anatomic variants: None other significant Delayed phase: No abnormal enhancement Review of the MIP images confirms the above findings IMPRESSION: Advanced atherosclerotic disease of the thoracic aorta but without evidence of  aneurysm or dissection. Atherosclerotic disease at the brachiocephalic vessel origins but without stenosis greater than 20%. 40% stenosis in the midportion of the right common carotid artery. Complete occlusion of the right internal carotid artery at its origin. Atherosclerotic disease at the left carotid bifurcation. Detail is limited by artifact and motion. Stenosis is estimated at 50% at the proximal ICA. Right vertebral artery widely patent through the neck. Web or focal dissection of the left subclavian artery just proximal to the left vertebral artery origin. Severe stenosis of the left vertebral artery origin, 80% or greater. The vessel is widely patent beyond that. Both posterior inferior cerebellar arteries are patent presently. No visible anterior inferior cerebellar artery on the right. This could be occluded or congenitally absent. Right ICA reconstitution in the supraclinoid region secondary to external to internal collaterals, patent anterior communicating artery and patent posterior communicating artery. Parenchymal distributions of the anterior and middle cerebral vessels appear intact. Advanced chronic lung disease with advanced emphysema. Pleural and parenchymal scarring at both apices,  with increased density compared to the CT chest of 2011. I cannot rule out the possibility of tumor in this region, though this may merely be progressive scarring. If the patient is a candidate for treatment pulmonary disease, complete chest CT would be warranted. Electronically Signed   By: Nelson Chimes M.D.   On: 02/06/2017 14:33   Ct Chest W Contrast  Result Date: 02/09/2017 CLINICAL DATA:  83 year old male with history of shortness of breath. Abnormalities noted in the lung apices noted on recent head neck CT. Followup study. EXAM: CT CHEST WITH CONTRAST TECHNIQUE: Multidetector CT imaging of the chest was performed during intravenous contrast administration. CONTRAST:  < 75 mL > ISOVUE-300 IOPAMIDOL  (ISOVUE-300) INJECTION 61% COMPARISON:  Chest CT 05/05/2010.  Neck CT 02/06/2017. FINDINGS: Cardiovascular: Heart size is normal. There is no significant pericardial fluid, thickening or pericardial calcification. Aortic atherosclerosis. No atherosclerotic calcifications in the coronary arteries. Thickening and calcification of the aortic valve. Mediastinum/Nodes: No pathologically enlarged mediastinal or hilar lymph nodes. Esophagus is unremarkable in appearance. No axillary lymphadenopathy. Lungs/Pleura: Extensive areas of chronic nodular and mass-like pleuroparenchymal architectural distortion, partially calcified, similar to prior studies, most compatible with areas of chronic post infectious or inflammatory scarring. Diffuse bronchial wall thickening with moderate to severe centrilobular and paraseptal emphysema. Multiple tiny pulmonary nodules are again noted scattered throughout the lungs bilaterally, generally similar to prior study from 05/05/2010, including the largest nodule in the right lower lobe which measures 10 x 6 mm (mean diameter of 8 mm) on image 118 of series 7. A few new pulmonary nodules are noted, highly nonspecific, measuring up to 5 mm in the left upper lobe (axial image 100 of series 7). No acute consolidative airspace disease. No pleural effusions. Upper Abdomen: Status post splenectomy.  Aortic atherosclerosis. Musculoskeletal: Chronic appearing compression fracture of T7 with approximately 40% loss of anterior vertebral body height. There are no aggressive appearing lytic or blastic lesions noted in the visualized portions of the skeleton. IMPRESSION: 1. Extensive scarring in the lung apices, similar to prior studies. 2. There are multiple tiny pulmonary nodules scattered throughout the lungs bilaterally, generally similar to prior study from 2011, considered benign. There are few exceptions which are new, measuring 5 mm or less in size. These are nonspecific, but likely benign. No  follow-up needed if patient is low-risk (and has no known or suspected primary neoplasm). Non-contrast chest CT can be considered in 12 months if patient is high-risk. This recommendation follows the consensus statement: Guidelines for Management of Incidental Pulmonary Nodules Detected on CT Images: From the Fleischner Society 2017; Radiology 2017; 284:228-24. 3. Aortic atherosclerosis. 4. Diffuse bronchial wall thickening with moderate to severe centrilobular and paraseptal emphysema; imaging findings suggestive of underlying COPD. Aortic Atherosclerosis (ICD10-I70.0) and Emphysema (ICD10-J43.9). Electronically Signed   By: Vinnie Langton M.D.   On: 02/09/2017 09:58   Mr Brain Wo Contrast  Result Date: 02/06/2017 CLINICAL DATA:  83 y/o  M; follow-up of stroke. EXAM: MRI HEAD WITHOUT CONTRAST MRA HEAD WITHOUT CONTRAST TECHNIQUE: Multiplanar, multiecho pulse sequences of the brain and surrounding structures were obtained without intravenous contrast. Angiographic images of the head were obtained using MRA technique without contrast. COMPARISON:  02/06/2017 CT of the head and CTA of the head. FINDINGS: MRI HEAD FINDINGS Brain: Reduced diffusion in the right inferior cerebellum. Associated T2 hyperintense signal abnormality and mild local mass effect compatible with late acute/ early subacute infarction. Unsure diffusion signal abnormality in the brain. No abnormal susceptibility hypointensity to indicate  intracranial hemorrhage. No hydrocephalus or effacement of basilar cisterns. Background of few scattered nonspecific foci of T2 FLAIR hyperintense signal abnormality in subcortical and periventricular white matter is compatible with mild chronic microvascular ischemic changes for age. Mild brain parenchymal volume loss. Vascular: As below. Skull and upper cervical spine: Normal marrow signal. Sinuses/Orbits: Mild right maxillary sinus mucosal thickening. Bilateral intra-ocular lens replacement. No abnormal  signal of mastoid air cells Other: None. MRA HEAD FINDINGS Internal carotid arteries: Patent left internal carotid artery. Mild irregularity of the cavernous segment without stenosis compatible with intracranial atherosclerosis. The right internal carotid artery is occluded to the paraclinoid segment where it is reconstituted to the terminus. Anterior cerebral arteries:  Patent. Middle cerebral arteries: Patent. Anterior communicating artery: Patent. Posterior communicating arteries: Large right and possible diminutive left posterior communicating arteries. Posterior cerebral arteries:  Patent. Basilar artery:  Patent. Vertebral arteries:  Patent. No evidence of high-grade stenosis, large vessel occlusion, or aneurysm unless noted above. IMPRESSION: 1. Right inferior cerebellum late acute/early subacute infarction stable in distribution in comparison with prior CT of the head given differences in technique. No acute hemorrhage. 2. Stable background of mild chronic microvascular ischemic changes and mild parenchymal volume loss of the brain. 3. Right ICA occlusion with reconstitution at the paraclinoid segment to the terminus. Large right posterior communicating artery. 4. Patent circle of Willis. No large vessel occlusion, aneurysm, or significant stenosis is identified. Electronically Signed   By: Kristine Garbe M.D.   On: 02/06/2017 19:40   Dg Swallowing Func-speech Pathology  Result Date: 02/07/2017 Objective Swallowing Evaluation: Type of Study: MBS-Modified Barium Swallow Study Patient Details Name: SEFERINO OSCAR MRN: [id number] Date of Birth: January 12, 1932 Today's Date: 02/07/2017 Time: SLP Start Time (ACUTE ONLY): 0819-SLP Stop Time (ACUTE ONLY): 0845 SLP Time Calculation (min) (ACUTE ONLY): 26 min Past Medical History: Past Medical History: Diagnosis Date . 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 Carotid artery occlusion  . COPD (chronic obstructive pulmonary disease) ([place])  . Fall  due to ice or snow Feb. 22, 2015  Partial Right Hip replaced . History of ITP  . Hx of colonic polyps  . Hyperlipemia  . Osteopenia   ? Marland Kitchen Osteoporosis   dexa 06, declines further dexa or tx  . Smoker  Past Surgical History: Past Surgical History: Procedure Laterality Date . BONE MARROW BIOPSY  05/2003 . CATARACT EXTRACTION   . Dexa    per patient ? osteopenia . HEMORRHOID SURGERY   . JOINT REPLACEMENT Right Feb. 22, 2015  Partial Hip . SPLENECTOMY   . TONSILLECTOMY AND ADENOIDECTOMY Bilateral age 46 per patient HPI: Mr. Bartunek is an 83 y.o.male with h/o HLD, vocal cord cancer, tobacco use and COPDwho presented to the ED today for evaluation of new onset dizziness and incoordination. Symptoms also included difficulty speaking, headache, nausea and vomiting. He was found by his wife on the floor beside vomit and a coffee pot he knocked down. Initially pt was unable to speak, but eventually he was able to state that he was hit with "a bout of dizziness. He has vomited three times since Sunday.   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  Swallow evaluation ordered, given h/o vocal fold cancer s/p XRT, SLP to proceed with MBS.  Subjective: pt awake in chair Assessment / Plan / Recommendation CHL IP CLINICAL IMPRESSIONS 02/07/2017 Clinical Impression Baseline coughing and expectorating secretions noted.  Pt presents with moderate pharyngeal dysphagia - suspect exacerbation of baseline deficits *known h/o dysphagia  from vocal cord cancer s/p XRT.  Decreased pharyngeal contraction/tongue base retraction and epiglottic deflection results in residuals (vallecular worse than pyriform sinus) without pt consistent awareness.   Cued dry swallow helpful to decrease residuals that pt does NOT sense and following solid with liquid helpful.  Pt suspected to aspirate with consumption of thin via straw *although missed actual swallow on flouro*.  Pt overtly coughed immediately postswallow of thin via straw - with at least laryngeal  penetration observed.  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  Cough with backflow of barium into pharynx and vomiting observed - suspect due to cerebellar cva.  NO aspiration of emesis noted.  Recommend start clear liquid diet with strict precautions.  Using teach back, pt was educated to findings/recommendations thoroughly.   SLP Visit Diagnosis Dysphagia, oropharyngeal phase (R13.12) Attention and concentration deficit following -- Frontal lobe and executive function deficit following -- Impact on safety and function Moderate aspiration risk   CHL IP TREATMENT RECOMMENDATION 02/07/2017 Treatment Recommendations F/U MBS in --- days (Comment)   Prognosis 02/07/2017 Prognosis for Safe Diet Advancement Fair Barriers to Reach Goals Severity of deficits Barriers/Prognosis Comment -- CHL IP DIET RECOMMENDATION 02/07/2017 SLP Diet Recommendations Thin liquid Liquid Administration via Cup;No straw Medication Administration Other (Comment) Compensations Small sips/bites;Slow rate;Follow solids with liquid;Multiple dry swallows after each bite/sip;Other (Comment) Postural Changes Seated upright at 90 degrees;Remain semi-upright after after feeds/meals (Comment)   CHL IP OTHER RECOMMENDATIONS 02/07/2017 Recommended Consults -- Oral Care Recommendations Oral care before and after PO Other Recommendations --   CHL IP FOLLOW UP RECOMMENDATIONS 02/07/2017 Follow up Recommendations None   CHL IP FREQUENCY AND DURATION 02/07/2017 Speech Therapy Frequency (ACUTE ONLY) min 2x/week Treatment Duration 2 weeks      CHL IP ORAL PHASE 02/07/2017 Oral Phase Impaired Oral - Pudding Teaspoon -- Oral - Pudding Cup -- Oral - Honey Teaspoon -- Oral - Honey Cup -- Oral - Nectar Teaspoon -- Oral - Nectar Cup -- Oral - Nectar Straw -- Oral - Thin Teaspoon WFL Oral - Thin Cup WFL Oral - Thin Straw Premature spillage Oral - Puree WFL Oral - Mech Soft WFL Oral - Regular -- Oral - Multi-Consistency -- Oral  - Pill -- Oral Phase - Comment --  CHL IP PHARYNGEAL PHASE 02/07/2017 Pharyngeal Phase Impaired Pharyngeal- Pudding Teaspoon -- Pharyngeal -- Pharyngeal- Pudding Cup -- Pharyngeal -- Pharyngeal- Honey Teaspoon -- Pharyngeal -- Pharyngeal- Honey Cup -- Pharyngeal -- Pharyngeal- Nectar Teaspoon Reduced pharyngeal peristalsis;Reduced epiglottic inversion;Reduced anterior laryngeal mobility;Reduced laryngeal elevation;Reduced airway/laryngeal closure;Reduced tongue base retraction;Pharyngeal residue - valleculae;Pharyngeal residue - pyriform Pharyngeal -- Pharyngeal- Nectar Cup Reduced anterior laryngeal mobility;Reduced laryngeal elevation;Reduced epiglottic inversion;Reduced pharyngeal peristalsis;Reduced tongue base retraction;Pharyngeal residue - valleculae;Pharyngeal residue - pyriform Pharyngeal -- Pharyngeal- Nectar Straw -- Pharyngeal -- Pharyngeal- Thin Teaspoon Reduced pharyngeal peristalsis;Reduced epiglottic inversion;Reduced anterior laryngeal mobility;Reduced laryngeal elevation;Reduced airway/laryngeal closure;Reduced tongue base retraction;Pharyngeal residue - valleculae;Pharyngeal residue - pyriform Pharyngeal -- Pharyngeal- Thin Cup Reduced pharyngeal peristalsis;Reduced epiglottic inversion;Reduced anterior laryngeal mobility;Reduced laryngeal elevation;Reduced airway/laryngeal closure;Reduced tongue base retraction;Pharyngeal residue - valleculae;Pharyngeal residue - pyriform Pharyngeal -- Pharyngeal- Thin Straw Reduced pharyngeal peristalsis;Reduced epiglottic inversion;Reduced anterior laryngeal mobility;Reduced laryngeal elevation;Reduced airway/laryngeal closure;Reduced tongue base retraction;Penetration/Aspiration during swallow Pharyngeal Material enters airway, passes BELOW cords and not ejected out despite cough attempt by patient Pharyngeal- Puree Reduced pharyngeal peristalsis;Reduced epiglottic inversion;Reduced tongue base retraction;Pharyngeal residue - valleculae Pharyngeal --  Pharyngeal- Mechanical Soft Reduced pharyngeal peristalsis;Reduced epiglottic inversion;Reduced tongue base retraction;Pharyngeal residue - valleculae  Pharyngeal -- Pharyngeal- Regular -- Pharyngeal -- Pharyngeal- Multi-consistency -- Pharyngeal -- Pharyngeal- Pill -- Pharyngeal -- Pharyngeal Comment CUED DRY SWALLOWS helpful to decrease residuals, "hock" to clear helpful but not fully effective  CHL IP CERVICAL ESOPHAGEAL PHASE 02/07/2017 Cervical Esophageal Phase Impaired Pudding Teaspoon -- Pudding Cup -- Honey Teaspoon -- Honey Cup -- Nectar Teaspoon -- Nectar Cup -- Nectar Straw -- Thin Teaspoon -- Thin Cup -- Thin Straw -- Puree -- Mechanical Soft -- Regular -- Multi-consistency -- Pill -- Cervical Esophageal Comment pt with strong coughing noted resulting in backflow of barium into pharynx and caused vomiting, upon esophageal sweep at end of MBS - pt appeared with clear esophagus No flowsheet data found. [name] 02/07/2017, 10:07 AM  Luanna Salk, MS [facility] SLP [phone]             Mr Jodene Nam Head Wo Contrast  Result Date: 02/06/2017 CLINICAL DATA:  83 y/o  M; follow-up of stroke. EXAM: MRI HEAD WITHOUT CONTRAST MRA HEAD WITHOUT CONTRAST TECHNIQUE: Multiplanar, multiecho pulse sequences of the brain and surrounding structures were obtained without intravenous contrast. Angiographic images of the head were obtained using MRA technique without contrast. COMPARISON:  02/06/2017 CT of the head and CTA of the head. FINDINGS: MRI HEAD FINDINGS Brain: Reduced diffusion in the right inferior cerebellum. Associated T2 hyperintense signal abnormality and mild local mass effect compatible with late acute/ early subacute infarction. Unsure diffusion signal abnormality in the brain. No abnormal susceptibility hypointensity to indicate intracranial hemorrhage. No hydrocephalus or effacement of basilar cisterns. Background of few scattered nonspecific foci of T2 FLAIR hyperintense signal abnormality in subcortical  and periventricular white matter is compatible with mild chronic microvascular ischemic changes for age. Mild brain parenchymal volume loss. Vascular: As below. Skull and upper cervical spine: Normal marrow signal. Sinuses/Orbits: Mild right maxillary sinus mucosal thickening. Bilateral intra-ocular lens replacement. No abnormal signal of mastoid air cells Other: None. MRA HEAD FINDINGS Internal carotid arteries: Patent left internal carotid artery. Mild irregularity of the cavernous segment without stenosis compatible with intracranial atherosclerosis. The right internal carotid artery is occluded to the paraclinoid segment where it is reconstituted to the terminus. Anterior cerebral arteries:  Patent. Middle cerebral arteries: Patent. Anterior communicating artery: Patent. Posterior communicating arteries: Large right and possible diminutive left posterior communicating arteries. Posterior cerebral arteries:  Patent. Basilar artery:  Patent. Vertebral arteries:  Patent. No evidence of high-grade stenosis, large vessel occlusion, or aneurysm unless noted above. IMPRESSION: 1. Right inferior cerebellum late acute/early subacute infarction stable in distribution in comparison with prior CT of the head given differences in technique. No acute hemorrhage. 2. Stable background of mild chronic microvascular ischemic changes and mild parenchymal volume loss of the brain. 3. Right ICA occlusion with reconstitution at the paraclinoid segment to the terminus. Large right posterior communicating artery. 4. Patent circle of Willis. No large vessel occlusion, aneurysm, or significant stenosis is identified. Electronically Signed   By: Kristine Garbe M.D.   On: 02/06/2017 19:40    Lab Data:  CBC:  Recent Labs Lab 02/06/17 0947 02/06/17 1009 02/08/17 0325  WBC 9.9  --  10.0  NEUTROABS 7.2  --   --   HGB 13.0 13.9 12.8*  HCT 40.4 41.0 38.4*  MCV 90.4  --  89.1  PLT 224  --  381   Basic Metabolic  Panel:  Recent Labs Lab 02/06/17 0947 02/06/17 1009 02/08/17 0325  NA 137 138 135  K 4.1 4.1 3.6  CL 104 103  105  CO2 24  --  24  GLUCOSE 129* 128* 102*  BUN '14 18 13  ' CREATININE 0.89 0.80 0.76  CALCIUM 9.3  --  8.8*   GFR: Estimated Creatinine Clearance: 56 mL/min (by C-G formula based on SCr of 0.76 mg/dL). Liver Function Tests:  Recent Labs Lab 02/06/17 0947  AST 29  ALT 17  ALKPHOS 52  BILITOT 1.0  PROT 7.4  ALBUMIN 3.7   No results for input(s): LIPASE, AMYLASE in the last 168 hours. No results for input(s): AMMONIA in the last 168 hours. Coagulation Profile:  Recent Labs Lab 02/06/17 0947  INR 1.03   Cardiac Enzymes: No results for input(s): CKTOTAL, CKMB, CKMBINDEX, TROPONINI in the last 168 hours. BNP (last 3 results) No results for input(s): PROBNP in the last 8760 hours. HbA1C: No results for input(s): HGBA1C in the last 72 hours. CBG:  Recent Labs Lab 02/06/17 1004  GLUCAP 123*   Lipid Profile: No results for input(s): CHOL, HDL, LDLCALC, TRIG, CHOLHDL, LDLDIRECT in the last 72 hours. Thyroid Function Tests:  Recent Labs  02/06/17 1522  TSH 0.722   Anemia Panel: No results for input(s): VITAMINB12, FOLATE, FERRITIN, TIBC, IRON, RETICCTPCT in the last 72 hours. Urine analysis: No results found for: COLORURINE, APPEARANCEUR, LABSPEC, PHURINE, GLUCOSEU, HGBUR, BILIRUBINUR, KETONESUR, PROTEINUR, UROBILINOGEN, NITRITE, LEUKOCYTESUR   Ripudeep Rai M.D. [facility] 02/09/2017, 1:59 PM  Pager: [phone] Between 7am to 7pm - call Pager - [phone]  After 7pm go to [email or web address] - password [email or web address]  Call night coverage person covering after 7pm

## 2017-02-09 NOTE — Progress Notes (Signed)
Physical Therapy Treatment Patient Details Name: Daniel Chapman MRN: [id number] DOB: [birth year] Today's Date: 02/09/2017    History of Present Illness 83 y.o. male with medical history significant for COPD not on home oxygen, tobacco use, vocal cord cancer, ITP, hyperlipidemia, carotid artery occlusion presents to emergency Department chief complaint dizziness & fall. MRI + R inferior  cerebellum infarct.    PT Comments    Pt making steady progress with mobility but continues to demonstrate instability with ambulation requiring constant min guard to min A to maintain upright position. Pt would continue to benefit from skilled physical therapy services at this time while admitted and after d/c to address the below listed limitations in order to improve overall safety and independence with functional mobility.    Follow Up Recommendations  SNF;Supervision/Assistance - 24 hour;Other (comment) (CIR was denied)     Equipment Recommendations  None recommended by PT    Recommendations for Other Services       Precautions / Restrictions Precautions Precautions: Fall Restrictions Weight Bearing Restrictions: No    Mobility  Bed Mobility               General bed mobility comments: pt OOB in recliner chair upon arrival  Transfers Overall transfer level: Needs assistance Equipment used: None Transfers: Sit to/from Stand Sit to Stand: Min assist         General transfer comment: min A for stability with transition into stand from chair  Ambulation/Gait Ambulation/Gait assistance: Min guard;Min assist Ambulation Distance (Feet): 300 Feet Assistive device: None Gait Pattern/deviations: Step-through pattern;Decreased stride length;Trunk flexed Gait velocity: decreased Gait velocity interpretation: Below normal speed for age/gender General Gait Details: mild instability but pt joking frequently throughout and making quick movements (as if he were dancing - partially to  compensate); pt occasionally needing min A to maintain upright position   Stairs            Wheelchair Mobility    Modified Rankin (Stroke Patients Only)       Balance Overall balance assessment: Needs assistance Sitting-balance support: Feet supported;No upper extremity supported Sitting balance-Leahy Scale: Fair     Standing balance support: During functional activity;No upper extremity supported Standing balance-Leahy Scale: Fair                              Cognition Arousal/Alertness: Awake/alert Behavior During Therapy: WFL for tasks assessed/performed Overall Cognitive Status: Impaired/Different from baseline Area of Impairment: Safety/judgement;Awareness                         Safety/Judgement: Decreased awareness of safety;Decreased awareness of deficits Awareness: Emergent          Exercises      General Comments        Pertinent Vitals/Pain Pain Assessment: No/denies pain    Home Living                      Prior Function            PT Goals (current goals can now be found in the care plan section) Acute Rehab PT Goals PT Goal Formulation: With patient Time For Goal Achievement: 02/21/17 Potential to Achieve Goals: Good Progress towards PT goals: Progressing toward goals    Frequency    Min 3X/week      PT Plan Current plan remains appropriate    Co-evaluation  AM-PAC PT "6 Clicks" Daily Activity  Outcome Measure  Difficulty turning over in bed (including adjusting bedclothes, sheets and blankets)?: A Little Difficulty moving from lying on back to sitting on the side of the bed? : A Little Difficulty sitting down on and standing up from a chair with arms (e.g., wheelchair, bedside commode, etc,.)?: Unable Help needed moving to and from a bed to chair (including a wheelchair)?: A Little Help needed walking in hospital room?: A Little Help needed climbing 3-5 steps with a  railing? : A Lot 6 Click Score: 15    End of Session Equipment Utilized During Treatment: Gait belt Activity Tolerance: Patient tolerated treatment well Patient left: in chair;with call bell/phone within reach;with chair alarm set;with family/visitor present Nurse Communication: Mobility status PT Visit Diagnosis: Unsteadiness on feet (R26.81);Ataxic gait (R26.0)     Time: 5361-4431 PT Time Calculation (min) (ACUTE ONLY): 14 min  Charges:  $Gait Training: 8-22 mins                    G Codes:       [place], [place], [place] 02/09/2017, 5:18 PM

## 2017-02-09 NOTE — Progress Notes (Signed)
Rehab admissions - I have received a denial from [facility] for acute inpatient rehab admission from the medical director.  [employer] feels patient does not need close medical monitoring by a rehab MD and patient needs can be met at SNF level rehab.  I have informed the unit case manager of denial.  Call me for questions.  #[phone]

## 2017-02-09 NOTE — Progress Notes (Signed)
Rehab admissions - I have opened the case with [facility] requesting acute inpatient rehab admission.  Awaiting call back from [facility] case manager. Call me for questions.  #[phone]

## 2017-02-09 NOTE — Progress Notes (Addendum)
[facility]                                                                              Patient Demographics  Daniel Chapman, is a 83 y.o. male, DOB - 1932-03-23, PPI:[id number]  Admit date - 02/06/2017   Admitting Physician Waldemar Dickens, MD  Outpatient Primary MD for the patient is Tower, [name], MD  Outpatient specialists:   LOS - 3  days   Medical records reviewed and are as summarized below:    Chief Complaint  Patient presents with  . Dizziness       Brief summary   HPI on 02/06/2017 by Ms. [name], NP Daniel Chapman a 83 y.o.malewith medical history significant for COPD not on home oxygen, tobacco use, vocal cord cancer, ITP, hyperlipidemia, carotid artery occlusion presents toemergency Department chief complaint dizziness fall yesterday. Initial evaluation reveals afib new onset andincludes a CT of the headrevealing Subacute and completed infarction of the right posterior inferior cerebellar territory. No intracranial hemorrhage, with minimal local mass effect secondary to edema.   Information is obtained from the patient and his wife is at the bedside. Patient is unable to articulate factly what happened yesterday. He can say that he became dizzy and fell down. He then got himself up to the chair. He does not remember vomiting but wife says when she came home from the grocery store she found him sitting in the chair with emesis on his shirt. In addition she found the house" disarray". Patient denies any headache visual disturbances numbness tingling of extremities. He denies chest pain palpitations shortness of breath. He denies nausea and does not remember vomiting. He denies diaphoresis lower extremity edema.He denies any fever chills recent travel or sick contacts. He denies dysuria hematuria frequency or urgency. He denies diarrhea constipation melena bright red blood per rectum. Wife states he appeared to have difficulty talking initially but  that improved as the evening wore on. He slept through the night without any problems. She called the PCP this morning to report what happened and they recommend she come to the emergency department   Assessment & Plan    Principal Problem: Acute CVA Likely embolic in the setting of newly diagnosed atrial fibrillation - CT head showed subacute and completed infarction of the right posterior inferior cerebellar territory. No intracranial hemorrhage with minimal local mass effect secondary to edema. - MRI/MRA showed right inferior cerebellum with late acute/early subacute infarction. Right ICA occlusion with reconstitution at the paraclinoid segment - 2-D echo with EF of 65-70% - CT angiogram of the head and neck showed advanced atherosclerotic disease of the thoracic aorta, no aneurysm or dissection, 40% stenosis in the midportion of right common carotid artery, complete occlusion of the right ICA adnexal region. Webb or focal dissection of the left subclavian artery just proximal to left vertebral artery region. Severe stenosis of the left vertebral artery lesion 80% or greater. - LDL 96, hemoglobin A1c 6.2 - Per neurology, continue Plavix, statin. Aspirin held secondary to history of ITP - PT recommended CIR, awaiting bed -Seen by speech therapist, placed on a liquid diet  Active Problems:    COPD (chronic obstructive pulmonary disease) ([place]), tobacco abuse - CT angiogram of the head and neck showed advanced chronic lung disease with advanced emphysema, pleural and parenchymal scarring at both apices with increased density compared to CT chest of 2011 cannot rule out the possibility of tumor in this region though this may be progressive scarring. - CT chest showed extensive scarring in the lung apices, similar to prior studies, multiple tiny pulmonary nodules scattered throughout the lung bilaterally similar to prior study in 2011, considered benign. Noncontrast chest CT can be considered in  12 months if patient is high risk. - Currently no wheezing, stable  New onset atrial fibrillation, Permanent - Currently on Plavix  - Given patient's size of stroke, neurology recommended waiting 7 days before starting anticoagulation, eliquis in 7 days, on 02/13/17  - once eliquis started, Plavix can be discontinued  History of ITP - Status post splenectomy, recommended to avoid aspirin  Right ICA chronic occlusion - Followed by vascular surgery as outpatient, BP goal 130 to 150  Hyperlipidemia - LDL 96, goal less than 70, started on Crestor 10 mg daily  Oropharyngeal dysphagia: Likely due to stroke - swallow evaluation done, recommended full liquid diet    Code Status: full  DVT Prophylaxis:  Lovenox  Family Communication: Discussed in detail with the patient, all imaging results, lab results explained to the patient and wife    Disposition Plan: Awaiting CIR  Time Spent in minutes   25 minutes  Procedures:  MRI, MRA CT angio  Consultants:   Neuro   Antimicrobials:      Medications  Scheduled Meds: . clopidogrel  75 mg Oral Daily  . enoxaparin (LOVENOX) injection  40 mg Subcutaneous Q24H  . rosuvastatin  10 mg Oral q1800   Continuous Infusions: PRN Meds:.acetaminophen **OR** acetaminophen (TYLENOL) oral liquid 160 mg/5 mL **OR** acetaminophen, ondansetron (ZOFRAN) IV, senna-docusate   Antibiotics   Anti-infectives    None        Subjective:   Daniel Chapman was seen and examined today. No new complaints today. Patient denies dizziness, chest pain, shortness of breath, abdominal pain, N/V/D/C, new weakness, numbess, tingling. No acute events overnight.    Objective:   Vitals:   02/08/17 1826 02/08/17 2100 02/09/17 0500 02/09/17 0903  BP: 131/84 114/81 122/67 (!) 107/56  Pulse: 78 83 86 83  Resp: _0 Temp: 98.3 F (36.8 C) 97.7 F (36.5 C) 98.1 F (36.7 C) 98.5 F (36.9 C)  TempSrc: Oral Oral Oral Oral  SpO2: 99% 99% 99% 99%    Weight:      Height:        Intake/Output Summary (Last 24 hours) at 02/09/17 1359 Last data filed at 02/08/17 2100  Gross per 24 hour  Intake                0 ml  Output              200 ml  Net             -200 ml     Wt Readings from Last 3 Encounters:  02/06/17 56.6 kg (124 lb 11.2 oz)  01/03/17 57.3 kg (126 lb 4 oz)  12/27/16 57.5 kg (126 lb 12.8 oz)     Exam   General: Alert and oriented x 3, NAD  Eyes:   HEENT:  Atraumatic, normocephalic  Cardiovascular: S1 S2 auscultated, 2/6 SEM, iregular   Respiratory: Clear to  auscultation bilaterally, no wheezing, rales or rhonchi  Gastrointestinal: Soft, nontender, nondistended, + bowel sounds  Ext: no pedal edema bilaterally  Neuro: no new deficits  Musculoskeletal: No digital cyanosis, clubbing  Skin: No rashes  Psych: Normal affect and demeanor, alert and oriented x3    Data Reviewed:  I have personally reviewed following labs and imaging studies  Micro Results No results found for this or any previous visit (from the past 240 hour(s)).  Radiology Reports Ct Angio Head W Or Wo Contrast  Result Date: 02/06/2017 CLINICAL DATA:  Acute right cerebellar infarction. EXAM: CT ANGIOGRAPHY HEAD AND NECK TECHNIQUE: Multidetector CT imaging of the head and neck was performed using the standard protocol during bolus administration of intravenous contrast. Multiplanar CT image reconstructions and MIPs were obtained to evaluate the vascular anatomy. Carotid stenosis measurements (when applicable) are obtained utilizing NASCET criteria, using the distal internal carotid diameter as the denominator. CONTRAST:  50 cc Isovue 370 COMPARISON:  Head CT same day FINDINGS: CTA NECK FINDINGS Aortic arch: Aortic atherosclerosis. No aneurysm or dissection. Branching pattern of the brachiocephalic vessels from the arch is normal. Atherosclerotic disease at the brachiocephalic vessel origins but without stenosis greater than about 20%.  Right carotid system: Common carotid artery shows atherosclerotic disease with narrowing in the mid portion of 40%. Occlusion of the right ICA at the origin. No antegrade flow in the ICA at the skullbase. Left carotid system: Common carotid artery widely patent to the bifurcation region. Motion degradation at the bifurcation. Streak artifact at the bifurcation. Atherosclerotic disease with soft and calcified plaque. Stenosis of the proximal ICA estimated at 50%. Motion degradation in the cervical ICA region without suspicion of flow limiting stenosis in that region. Vertebral arteries: Right vertebral artery origin widely patent. No subclavian stenosis proximal or distal to that. Right vertebral artery is widely patent through the cervical region to the foramen magnum. Left subclavian artery shows a focal way lobe or localized dissection just proximal to the left vertebral artery origin. There is severe stenosis at the left vertebral artery origin, 80% or greater. The vessel is patent beyond [facility] through the cervical region to the foramen magnum. Skeleton: Ordinary mid cervical spondylosis. Other neck: No mass or lymphadenopathy. Benign appearing sebaceous cyst of the right lower neck. Upper chest: Extensive upper lobe emphysema with pleural and parenchymal scarring. Regions of scarring appear masslike and are progressive since the most recent chest CT of 05/05/2010. Depending on the clinical status, complete chest CT would be suggested if the patient is a candidate for treatment of pulmonary disease. Review of the MIP images confirms the above findings CTA HEAD FINDINGS Anterior circulation: Left internal carotid artery is patent through the skullbase and siphon regions. There is some external to internal collateral flow with reconstitution of the distal siphon and supraclinoid ICA on the right. There are also patent anterior and posterior communicating arteries. No ACA or MCA stenosis. No aneurysm or vascular  malformation. Posterior circulation: Both vertebral arteries are patent at the foramen magnum level. Flow is present in both posterior inferior cerebellar artery is presently. Both vertebral artery supply the basilar. No basilar stenosis. Left anterior inferior cerebellar artery, superior cerebellar arteries and posterior cerebral arteries are patent bilaterally without evidence of flow limiting stenosis or missing components. It is not possible to determine if the right anterior inferior cerebellar artery is occluded or absent developmentally. Venous sinuses: Patent and normal Anatomic variants: None other significant Delayed phase: No abnormal enhancement Review of the MIP images  confirms the above findings IMPRESSION: Advanced atherosclerotic disease of the thoracic aorta but without evidence of aneurysm or dissection. Atherosclerotic disease at the brachiocephalic vessel origins but without stenosis greater than 20%. 40% stenosis in the midportion of the right common carotid artery. Complete occlusion of the right internal carotid artery at its origin. Atherosclerotic disease at the left carotid bifurcation. Detail is limited by artifact and motion. Stenosis is estimated at 50% at the proximal ICA. Right vertebral artery widely patent through the neck. Web or focal dissection of the left subclavian artery just proximal to the left vertebral artery origin. Severe stenosis of the left vertebral artery origin, 80% or greater. The vessel is widely patent beyond that. Both posterior inferior cerebellar arteries are patent presently. No visible anterior inferior cerebellar artery on the right. This could be occluded or congenitally absent. Right ICA reconstitution in the supraclinoid region secondary to external to internal collaterals, patent anterior communicating artery and patent posterior communicating artery. Parenchymal distributions of the anterior and middle cerebral vessels appear intact. Advanced chronic  lung disease with advanced emphysema. Pleural and parenchymal scarring at both apices, with increased density compared to the CT chest of 2011. I cannot rule out the possibility of tumor in this region, though this may merely be progressive scarring. If the patient is a candidate for treatment pulmonary disease, complete chest CT would be warranted. Electronically Signed   By: Nelson Chimes M.D.   On: 02/06/2017 14:33   Dg Chest 2 View  Result Date: 02/06/2017 CLINICAL DATA:  Stroke.  No chest complaints. EXAM: CHEST  2 VIEW COMPARISON:  07/19/2011 FINDINGS: Hyperinflation. Lateral view degraded by patient arm position. Midthoracic compression deformity is mild to moderate and similar. Numerous leads and wires project over the chest. Midline trachea. Normal heart size and mediastinal contours for age. Left costophrenic angle excluded from the frontal radiograph. No pleural effusion or pneumothorax. Biapical pleural-parenchymal scarring. Diffuse peribronchial thickening. IMPRESSION: 1.  No acute cardiopulmonary disease. 2. COPD/chronic bronchitis. Pleural-parenchymal scarring at the apices. Electronically Signed   By: Abigail Miyamoto M.D.   On: 02/06/2017 12:42   Ct Head Wo Contrast  Result Date: 02/06/2017 CLINICAL DATA:  83 year old male with a history of dizziness EXAM: CT HEAD WITHOUT CONTRAST TECHNIQUE: Contiguous axial images were obtained from the base of the skull through the vertex without intravenous contrast. COMPARISON:  None. FINDINGS: Brain: Confluent hypodense territory involving the inferior and medial right cerebellar hemisphere. No acute intracranial hemorrhage. Minimal mass effect resulting in slight narrowing of the fourth ventricle. Unremarkable configuration of the ventricles. No supratentorial hemorrhage, midline shift, mass effect. Gray-white differentiation of the anterior circulation maintained. Vascular: Calcifications of intracranial vasculature. Sagittal reformatted images demonstrate  questionable hyperdensity of the vertebrobasilar junction/inferior basilar artery. Skull: No acute bony abnormality.  No aggressive bone lesion. Sinuses/Orbits: No significant atherosclerotic changes. Other: None IMPRESSION: Subacute and completed infarction of the right posterior inferior cerebellar territory. No intracranial hemorrhage, with minimal local mass effect secondary to edema. Sagittal reformatted images demonstrate questionable hyperdensity of the vertebrobasilar junction and the inferior basilar artery, potentially representing thrombus. CT angiogram may be useful. Electronically Signed   By: Corrie Mckusick D.O.   On: 02/06/2017 11:07   Ct Angio Neck W Or Wo Contrast  Result Date: 02/06/2017 CLINICAL DATA:  Acute right cerebellar infarction. EXAM: CT ANGIOGRAPHY HEAD AND NECK TECHNIQUE: Multidetector CT imaging of the head and neck was performed using the standard protocol during bolus administration of intravenous contrast. Multiplanar CT image reconstructions and  MIPs were obtained to evaluate the vascular anatomy. Carotid stenosis measurements (when applicable) are obtained utilizing NASCET criteria, using the distal internal carotid diameter as the denominator. CONTRAST:  50 cc Isovue 370 COMPARISON:  Head CT same day FINDINGS: CTA NECK FINDINGS Aortic arch: Aortic atherosclerosis. No aneurysm or dissection. Branching pattern of the brachiocephalic vessels from the arch is normal. Atherosclerotic disease at the brachiocephalic vessel origins but without stenosis greater than about 20%. Right carotid system: Common carotid artery shows atherosclerotic disease with narrowing in the mid portion of 40%. Occlusion of the right ICA at the origin. No antegrade flow in the ICA at the skullbase. Left carotid system: Common carotid artery widely patent to the bifurcation region. Motion degradation at the bifurcation. Streak artifact at the bifurcation. Atherosclerotic disease with soft and calcified  plaque. Stenosis of the proximal ICA estimated at 50%. Motion degradation in the cervical ICA region without suspicion of flow limiting stenosis in that region. Vertebral arteries: Right vertebral artery origin widely patent. No subclavian stenosis proximal or distal to that. Right vertebral artery is widely patent through the cervical region to the foramen magnum. Left subclavian artery shows a focal way lobe or localized dissection just proximal to the left vertebral artery origin. There is severe stenosis at the left vertebral artery origin, 80% or greater. The vessel is patent beyond [facility] through the cervical region to the foramen magnum. Skeleton: Ordinary mid cervical spondylosis. Other neck: No mass or lymphadenopathy. Benign appearing sebaceous cyst of the right lower neck. Upper chest: Extensive upper lobe emphysema with pleural and parenchymal scarring. Regions of scarring appear masslike and are progressive since the most recent chest CT of 05/05/2010. Depending on the clinical status, complete chest CT would be suggested if the patient is a candidate for treatment of pulmonary disease. Review of the MIP images confirms the above findings CTA HEAD FINDINGS Anterior circulation: Left internal carotid artery is patent through the skullbase and siphon regions. There is some external to internal collateral flow with reconstitution of the distal siphon and supraclinoid ICA on the right. There are also patent anterior and posterior communicating arteries. No ACA or MCA stenosis. No aneurysm or vascular malformation. Posterior circulation: Both vertebral arteries are patent at the foramen magnum level. Flow is present in both posterior inferior cerebellar artery is presently. Both vertebral artery supply the basilar. No basilar stenosis. Left anterior inferior cerebellar artery, superior cerebellar arteries and posterior cerebral arteries are patent bilaterally without evidence of flow limiting stenosis or  missing components. It is not possible to determine if the right anterior inferior cerebellar artery is occluded or absent developmentally. Venous sinuses: Patent and normal Anatomic variants: None other significant Delayed phase: No abnormal enhancement Review of the MIP images confirms the above findings IMPRESSION: Advanced atherosclerotic disease of the thoracic aorta but without evidence of aneurysm or dissection. Atherosclerotic disease at the brachiocephalic vessel origins but without stenosis greater than 20%. 40% stenosis in the midportion of the right common carotid artery. Complete occlusion of the right internal carotid artery at its origin. Atherosclerotic disease at the left carotid bifurcation. Detail is limited by artifact and motion. Stenosis is estimated at 50% at the proximal ICA. Right vertebral artery widely patent through the neck. Web or focal dissection of the left subclavian artery just proximal to the left vertebral artery origin. Severe stenosis of the left vertebral artery origin, 80% or greater. The vessel is widely patent beyond that. Both posterior inferior cerebellar arteries are patent presently. No visible anterior  inferior cerebellar artery on the right. This could be occluded or congenitally absent. Right ICA reconstitution in the supraclinoid region secondary to external to internal collaterals, patent anterior communicating artery and patent posterior communicating artery. Parenchymal distributions of the anterior and middle cerebral vessels appear intact. Advanced chronic lung disease with advanced emphysema. Pleural and parenchymal scarring at both apices, with increased density compared to the CT chest of 2011. I cannot rule out the possibility of tumor in this region, though this may merely be progressive scarring. If the patient is a candidate for treatment pulmonary disease, complete chest CT would be warranted. Electronically Signed   By: Nelson Chimes M.D.   On:  02/06/2017 14:33   Ct Chest W Contrast  Result Date: 02/09/2017 CLINICAL DATA:  83 year old male with history of shortness of breath. Abnormalities noted in the lung apices noted on recent head neck CT. Followup study. EXAM: CT CHEST WITH CONTRAST TECHNIQUE: Multidetector CT imaging of the chest was performed during intravenous contrast administration. CONTRAST:  < 75 mL > ISOVUE-300 IOPAMIDOL (ISOVUE-300) INJECTION 61% COMPARISON:  Chest CT 05/05/2010.  Neck CT 02/06/2017. FINDINGS: Cardiovascular: Heart size is normal. There is no significant pericardial fluid, thickening or pericardial calcification. Aortic atherosclerosis. No atherosclerotic calcifications in the coronary arteries. Thickening and calcification of the aortic valve. Mediastinum/Nodes: No pathologically enlarged mediastinal or hilar lymph nodes. Esophagus is unremarkable in appearance. No axillary lymphadenopathy. Lungs/Pleura: Extensive areas of chronic nodular and mass-like pleuroparenchymal architectural distortion, partially calcified, similar to prior studies, most compatible with areas of chronic post infectious or inflammatory scarring. Diffuse bronchial wall thickening with moderate to severe centrilobular and paraseptal emphysema. Multiple tiny pulmonary nodules are again noted scattered throughout the lungs bilaterally, generally similar to prior study from 05/05/2010, including the largest nodule in the right lower lobe which measures 10 x 6 mm (mean diameter of 8 mm) on image 118 of series 7. A few new pulmonary nodules are noted, highly nonspecific, measuring up to 5 mm in the left upper lobe (axial image 100 of series 7). No acute consolidative airspace disease. No pleural effusions. Upper Abdomen: Status post splenectomy.  Aortic atherosclerosis. Musculoskeletal: Chronic appearing compression fracture of T7 with approximately 40% loss of anterior vertebral body height. There are no aggressive appearing lytic or blastic lesions  noted in the visualized portions of the skeleton. IMPRESSION: 1. Extensive scarring in the lung apices, similar to prior studies. 2. There are multiple tiny pulmonary nodules scattered throughout the lungs bilaterally, generally similar to prior study from 2011, considered benign. There are few exceptions which are new, measuring 5 mm or less in size. These are nonspecific, but likely benign. No follow-up needed if patient is low-risk (and has no known or suspected primary neoplasm). Non-contrast chest CT can be considered in 12 months if patient is high-risk. This recommendation follows the consensus statement: Guidelines for Management of Incidental Pulmonary Nodules Detected on CT Images: From the Fleischner Society 2017; Radiology 2017; 284:228-24. 3. Aortic atherosclerosis. 4. Diffuse bronchial wall thickening with moderate to severe centrilobular and paraseptal emphysema; imaging findings suggestive of underlying COPD. Aortic Atherosclerosis (ICD10-I70.0) and Emphysema (ICD10-J43.9). Electronically Signed   By: Vinnie Langton M.D.   On: 02/09/2017 09:58   Daniel Brain Wo Contrast  Result Date: 02/06/2017 CLINICAL DATA:  83 y/o  M; follow-up of stroke. EXAM: MRI HEAD WITHOUT CONTRAST MRA HEAD WITHOUT CONTRAST TECHNIQUE: Multiplanar, multiecho pulse sequences of the brain and surrounding structures were obtained without intravenous contrast. Angiographic images of the head were  obtained using MRA technique without contrast. COMPARISON:  02/06/2017 CT of the head and CTA of the head. FINDINGS: MRI HEAD FINDINGS Brain: Reduced diffusion in the right inferior cerebellum. Associated T2 hyperintense signal abnormality and mild local mass effect compatible with late acute/ early subacute infarction. Unsure diffusion signal abnormality in the brain. No abnormal susceptibility hypointensity to indicate intracranial hemorrhage. No hydrocephalus or effacement of basilar cisterns. Background of few scattered nonspecific  foci of T2 FLAIR hyperintense signal abnormality in subcortical and periventricular white matter is compatible with mild chronic microvascular ischemic changes for age. Mild brain parenchymal volume loss. Vascular: As below. Skull and upper cervical spine: Normal marrow signal. Sinuses/Orbits: Mild right maxillary sinus mucosal thickening. Bilateral intra-ocular lens replacement. No abnormal signal of mastoid air cells Other: None. MRA HEAD FINDINGS Internal carotid arteries: Patent left internal carotid artery. Mild irregularity of the cavernous segment without stenosis compatible with intracranial atherosclerosis. The right internal carotid artery is occluded to the paraclinoid segment where it is reconstituted to the terminus. Anterior cerebral arteries:  Patent. Middle cerebral arteries: Patent. Anterior communicating artery: Patent. Posterior communicating arteries: Large right and possible diminutive left posterior communicating arteries. Posterior cerebral arteries:  Patent. Basilar artery:  Patent. Vertebral arteries:  Patent. No evidence of high-grade stenosis, large vessel occlusion, or aneurysm unless noted above. IMPRESSION: 1. Right inferior cerebellum late acute/early subacute infarction stable in distribution in comparison with prior CT of the head given differences in technique. No acute hemorrhage. 2. Stable background of mild chronic microvascular ischemic changes and mild parenchymal volume loss of the brain. 3. Right ICA occlusion with reconstitution at the paraclinoid segment to the terminus. Large right posterior communicating artery. 4. Patent circle of Willis. No large vessel occlusion, aneurysm, or significant stenosis is identified. Electronically Signed   By: Kristine Garbe M.D.   On: 02/06/2017 19:40   Dg Swallowing Func-speech Pathology  Result Date: 02/07/2017 Objective Swallowing Evaluation: Type of Study: MBS-Modified Barium Swallow Study Patient Details Name: Daniel Chapman MRN: [id number] Date of Birth: Jan 05, 1932 Today's Date: 02/07/2017 Time: SLP Start Time (ACUTE ONLY): 0819-SLP Stop Time (ACUTE ONLY): 0845 SLP Time Calculation (min) (ACUTE ONLY): 26 min Past Medical History: Past Medical History: Diagnosis Date . 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 Carotid artery occlusion  . COPD (chronic obstructive pulmonary disease) ([place])  . Fall due to ice or snow Feb. 22, 2015  Partial Right Hip replaced . History of ITP  . Hx of colonic polyps  . Hyperlipemia  . Osteopenia   ? Marland Kitchen Osteoporosis   dexa 06, declines further dexa or tx  . Smoker  Past Surgical History: Past Surgical History: Procedure Laterality Date . BONE MARROW BIOPSY  05/2003 . CATARACT EXTRACTION   . Dexa    per patient ? osteopenia . HEMORRHOID SURGERY   . JOINT REPLACEMENT Right Feb. 22, 2015  Partial Hip . SPLENECTOMY   . TONSILLECTOMY AND ADENOIDECTOMY Bilateral age 73 per patient HPI: Daniel Chapman is an 83 y.o.male with h/o HLD, vocal cord cancer, tobacco use and COPDwho presented to the ED today for evaluation of new onset dizziness and incoordination. Symptoms also included difficulty speaking, headache, nausea and vomiting. He was found by his wife on the floor beside vomit and a coffee pot he knocked down. Initially pt was unable to speak, but eventually he was able to state that he was hit with "a bout of dizziness. He has vomited three times since Sunday.   Pt found  to have cerebellar CVA.  Swallow evaluation ordered, given h/o vocal fold cancer s/p XRT, SLP to proceed with MBS.  Subjective: pt awake in chair Assessment / Plan / Recommendation CHL IP CLINICAL IMPRESSIONS 02/07/2017 Clinical Impression Baseline coughing and expectorating secretions noted.  Pt presents with moderate pharyngeal dysphagia - suspect exacerbation of baseline deficits *known h/o dysphagia from vocal cord cancer s/p XRT.  Decreased pharyngeal contraction/tongue base retraction and epiglottic deflection results in  residuals (vallecular worse than pyriform sinus) without pt consistent awareness.   Cued dry swallow helpful to decrease residuals that pt does NOT sense and following solid with liquid helpful.  Pt suspected to aspirate with consumption of thin via straw *although missed actual swallow on flouro*.  Pt overtly coughed immediately postswallow of thin via straw - with at least laryngeal penetration observed.  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  Cough with backflow of barium into pharynx and vomiting observed - suspect due to cerebellar cva.  NO aspiration of emesis noted.  Recommend start clear liquid diet with strict precautions.  Using teach back, pt was educated to findings/recommendations thoroughly.   SLP Visit Diagnosis Dysphagia, oropharyngeal phase (R13.12) Attention and concentration deficit following -- Frontal lobe and executive function deficit following -- Impact on safety and function Moderate aspiration risk   CHL IP TREATMENT RECOMMENDATION 02/07/2017 Treatment Recommendations F/U MBS in --- days (Comment)   Prognosis 02/07/2017 Prognosis for Safe Diet Advancement Fair Barriers to Reach Goals Severity of deficits Barriers/Prognosis Comment -- CHL IP DIET RECOMMENDATION 02/07/2017 SLP Diet Recommendations Thin liquid Liquid Administration via Cup;No straw Medication Administration Other (Comment) Compensations Small sips/bites;Slow rate;Follow solids with liquid;Multiple dry swallows after each bite/sip;Other (Comment) Postural Changes Seated upright at 90 degrees;Remain semi-upright after after feeds/meals (Comment)   CHL IP OTHER RECOMMENDATIONS 02/07/2017 Recommended Consults -- Oral Care Recommendations Oral care before and after PO Other Recommendations --   CHL IP FOLLOW UP RECOMMENDATIONS 02/07/2017 Follow up Recommendations None   CHL IP FREQUENCY AND DURATION 02/07/2017 Speech Therapy Frequency (ACUTE ONLY) min 2x/week Treatment Duration 2 weeks       CHL IP ORAL PHASE 02/07/2017 Oral Phase Impaired Oral - Pudding Teaspoon -- Oral - Pudding Cup -- Oral - Honey Teaspoon -- Oral - Honey Cup -- Oral - Nectar Teaspoon -- Oral - Nectar Cup -- Oral - Nectar Straw -- Oral - Thin Teaspoon WFL Oral - Thin Cup WFL Oral - Thin Straw Premature spillage Oral - Puree WFL Oral - Mech Soft WFL Oral - Regular -- Oral - Multi-Consistency -- Oral - Pill -- Oral Phase - Comment --  CHL IP PHARYNGEAL PHASE 02/07/2017 Pharyngeal Phase Impaired Pharyngeal- Pudding Teaspoon -- Pharyngeal -- Pharyngeal- Pudding Cup -- Pharyngeal -- Pharyngeal- Honey Teaspoon -- Pharyngeal -- Pharyngeal- Honey Cup -- Pharyngeal -- Pharyngeal- Nectar Teaspoon Reduced pharyngeal peristalsis;Reduced epiglottic inversion;Reduced anterior laryngeal mobility;Reduced laryngeal elevation;Reduced airway/laryngeal closure;Reduced tongue base retraction;Pharyngeal residue - valleculae;Pharyngeal residue - pyriform Pharyngeal -- Pharyngeal- Nectar Cup Reduced anterior laryngeal mobility;Reduced laryngeal elevation;Reduced epiglottic inversion;Reduced pharyngeal peristalsis;Reduced tongue base retraction;Pharyngeal residue - valleculae;Pharyngeal residue - pyriform Pharyngeal -- Pharyngeal- Nectar Straw -- Pharyngeal -- Pharyngeal- Thin Teaspoon Reduced pharyngeal peristalsis;Reduced epiglottic inversion;Reduced anterior laryngeal mobility;Reduced laryngeal elevation;Reduced airway/laryngeal closure;Reduced tongue base retraction;Pharyngeal residue - valleculae;Pharyngeal residue - pyriform Pharyngeal -- Pharyngeal- Thin Cup Reduced pharyngeal peristalsis;Reduced epiglottic inversion;Reduced anterior laryngeal mobility;Reduced laryngeal elevation;Reduced airway/laryngeal closure;Reduced tongue base retraction;Pharyngeal residue - valleculae;Pharyngeal residue - pyriform Pharyngeal -- Pharyngeal- Thin Straw Reduced pharyngeal peristalsis;Reduced  epiglottic inversion;Reduced anterior laryngeal mobility;Reduced  laryngeal elevation;Reduced airway/laryngeal closure;Reduced tongue base retraction;Penetration/Aspiration during swallow Pharyngeal Material enters airway, passes BELOW cords and not ejected out despite cough attempt by patient Pharyngeal- Puree Reduced pharyngeal peristalsis;Reduced epiglottic inversion;Reduced tongue base retraction;Pharyngeal residue - valleculae Pharyngeal -- Pharyngeal- Mechanical Soft Reduced pharyngeal peristalsis;Reduced epiglottic inversion;Reduced tongue base retraction;Pharyngeal residue - valleculae Pharyngeal -- Pharyngeal- Regular -- Pharyngeal -- Pharyngeal- Multi-consistency -- Pharyngeal -- Pharyngeal- Pill -- Pharyngeal -- Pharyngeal Comment CUED DRY SWALLOWS helpful to decrease residuals, "hock" to clear helpful but not fully effective  CHL IP CERVICAL ESOPHAGEAL PHASE 02/07/2017 Cervical Esophageal Phase Impaired Pudding Teaspoon -- Pudding Cup -- Honey Teaspoon -- Honey Cup -- Nectar Teaspoon -- Nectar Cup -- Nectar Straw -- Thin Teaspoon -- Thin Cup -- Thin Straw -- Puree -- Mechanical Soft -- Regular -- Multi-consistency -- Pill -- Cervical Esophageal Comment pt with strong coughing noted resulting in backflow of barium into pharynx and caused vomiting, upon esophageal sweep at end of MBS - pt appeared with clear esophagus No flowsheet data found. [name] 02/07/2017, 10:07 AM  Luanna Salk, MS [facility] SLP [phone]             Daniel Chapman Head Wo Contrast  Result Date: 02/06/2017 CLINICAL DATA:  83 y/o  M; follow-up of stroke. EXAM: MRI HEAD WITHOUT CONTRAST MRA HEAD WITHOUT CONTRAST TECHNIQUE: Multiplanar, multiecho pulse sequences of the brain and surrounding structures were obtained without intravenous contrast. Angiographic images of the head were obtained using MRA technique without contrast. COMPARISON:  02/06/2017 CT of the head and CTA of the head. FINDINGS: MRI HEAD FINDINGS Brain: Reduced diffusion in the right inferior cerebellum. Associated T2 hyperintense  signal abnormality and mild local mass effect compatible with late acute/ early subacute infarction. Unsure diffusion signal abnormality in the brain. No abnormal susceptibility hypointensity to indicate intracranial hemorrhage. No hydrocephalus or effacement of basilar cisterns. Background of few scattered nonspecific foci of T2 FLAIR hyperintense signal abnormality in subcortical and periventricular white matter is compatible with mild chronic microvascular ischemic changes for age. Mild brain parenchymal volume loss. Vascular: As below. Skull and upper cervical spine: Normal marrow signal. Sinuses/Orbits: Mild right maxillary sinus mucosal thickening. Bilateral intra-ocular lens replacement. No abnormal signal of mastoid air cells Other: None. MRA HEAD FINDINGS Internal carotid arteries: Patent left internal carotid artery. Mild irregularity of the cavernous segment without stenosis compatible with intracranial atherosclerosis. The right internal carotid artery is occluded to the paraclinoid segment where it is reconstituted to the terminus. Anterior cerebral arteries:  Patent. Middle cerebral arteries: Patent. Anterior communicating artery: Patent. Posterior communicating arteries: Large right and possible diminutive left posterior communicating arteries. Posterior cerebral arteries:  Patent. Basilar artery:  Patent. Vertebral arteries:  Patent. No evidence of high-grade stenosis, large vessel occlusion, or aneurysm unless noted above. IMPRESSION: 1. Right inferior cerebellum late acute/early subacute infarction stable in distribution in comparison with prior CT of the head given differences in technique. No acute hemorrhage. 2. Stable background of mild chronic microvascular ischemic changes and mild parenchymal volume loss of the brain. 3. Right ICA occlusion with reconstitution at the paraclinoid segment to the terminus. Large right posterior communicating artery. 4. Patent circle of Willis. No large vessel  occlusion, aneurysm, or significant stenosis is identified. Electronically Signed   By: Kristine Garbe M.D.   On: 02/06/2017 19:40    Lab Data:  CBC:  Recent Labs Lab 02/06/17 0947 02/06/17 1009 02/08/17 0325  WBC 9.9  --  10.0  NEUTROABS  7.2  --   --   HGB 13.0 13.9 12.8*  HCT 40.4 41.0 38.4*  MCV 90.4  --  89.1  PLT 224  --  435   Basic Metabolic Panel:  Recent Labs Lab 02/06/17 0947 02/06/17 1009 02/08/17 0325  NA 137 138 135  K 4.1 4.1 3.6  CL 104 103 105  CO2 24  --  24  GLUCOSE 129* 128* 102*  BUN _0 CREATININE 0.89 0.80 0.76  CALCIUM 9.3  --  8.8*   GFR: Estimated Creatinine Clearance: 56 mL/min (by C-G formula based on SCr of 0.76 mg/dL). Liver Function Tests:  Recent Labs Lab 02/06/17 0947  AST 29  ALT 17  ALKPHOS 52  BILITOT 1.0  PROT 7.4  ALBUMIN 3.7   No results for input(s): LIPASE, AMYLASE in the last 168 hours. No results for input(s): AMMONIA in the last 168 hours. Coagulation Profile:  Recent Labs Lab 02/06/17 0947  INR 1.03   Cardiac Enzymes: No results for input(s): CKTOTAL, CKMB, CKMBINDEX, TROPONINI in the last 168 hours. BNP (last 3 results) No results for input(s): PROBNP in the last 8760 hours. HbA1C: No results for input(s): HGBA1C in the last 72 hours. CBG:  Recent Labs Lab 02/06/17 1004  GLUCAP 123*   Lipid Profile: No results for input(s): CHOL, HDL, LDLCALC, TRIG, CHOLHDL, LDLDIRECT in the last 72 hours. Thyroid Function Tests:  Recent Labs  02/06/17 1522  TSH 0.722   Anemia Panel: No results for input(s): VITAMINB12, FOLATE, FERRITIN, TIBC, IRON, RETICCTPCT in the last 72 hours. Urine analysis: No results found for: COLORURINE, APPEARANCEUR, LABSPEC, PHURINE, GLUCOSEU, HGBUR, BILIRUBINUR, KETONESUR, PROTEINUR, UROBILINOGEN, NITRITE, LEUKOCYTESUR   Joleah Kosak M.D. [facility] 02/09/2017, 1:59 PM  Pager: [phone] Between 7am to 7pm - call Pager - [phone]  After 7pm go  to [email or web address] - password [email or web address]  Call night coverage person covering after 7pm

## 2017-02-10 ENCOUNTER — Encounter: Payer: Self-pay | Admitting: Cardiology

## 2017-02-10 DIAGNOSIS — Z7901 Long term (current) use of anticoagulants: Secondary | ICD-10-CM | POA: Diagnosis not present

## 2017-02-10 DIAGNOSIS — R2681 Unsteadiness on feet: Secondary | ICD-10-CM | POA: Diagnosis not present

## 2017-02-10 DIAGNOSIS — J449 Chronic obstructive pulmonary disease, unspecified: Secondary | ICD-10-CM | POA: Diagnosis not present

## 2017-02-10 DIAGNOSIS — Z8673 Personal history of transient ischemic attack (TIA), and cerebral infarction without residual deficits: Secondary | ICD-10-CM | POA: Diagnosis not present

## 2017-02-10 DIAGNOSIS — E782 Mixed hyperlipidemia: Secondary | ICD-10-CM | POA: Diagnosis not present

## 2017-02-10 DIAGNOSIS — I6521 Occlusion and stenosis of right carotid artery: Secondary | ICD-10-CM | POA: Diagnosis not present

## 2017-02-10 DIAGNOSIS — F17210 Nicotine dependence, cigarettes, uncomplicated: Secondary | ICD-10-CM | POA: Diagnosis not present

## 2017-02-10 DIAGNOSIS — D693 Immune thrombocytopenic purpura: Secondary | ICD-10-CM | POA: Diagnosis not present

## 2017-02-10 DIAGNOSIS — I739 Peripheral vascular disease, unspecified: Secondary | ICD-10-CM | POA: Diagnosis not present

## 2017-02-10 DIAGNOSIS — Z7902 Long term (current) use of antithrombotics/antiplatelets: Secondary | ICD-10-CM | POA: Diagnosis not present

## 2017-02-10 DIAGNOSIS — Z23 Encounter for immunization: Secondary | ICD-10-CM | POA: Diagnosis not present

## 2017-02-10 DIAGNOSIS — C320 Malignant neoplasm of glottis: Secondary | ICD-10-CM | POA: Diagnosis not present

## 2017-02-10 DIAGNOSIS — E785 Hyperlipidemia, unspecified: Secondary | ICD-10-CM | POA: Diagnosis not present

## 2017-02-10 DIAGNOSIS — I10 Essential (primary) hypertension: Secondary | ICD-10-CM | POA: Diagnosis not present

## 2017-02-10 DIAGNOSIS — Z9081 Acquired absence of spleen: Secondary | ICD-10-CM | POA: Diagnosis not present

## 2017-02-10 DIAGNOSIS — Z86010 Personal history of colonic polyps: Secondary | ICD-10-CM | POA: Diagnosis not present

## 2017-02-10 DIAGNOSIS — Z96641 Presence of right artificial hip joint: Secondary | ICD-10-CM | POA: Diagnosis not present

## 2017-02-10 DIAGNOSIS — I6309 Cerebral infarction due to thrombosis of other precerebral artery: Secondary | ICD-10-CM | POA: Diagnosis not present

## 2017-02-10 DIAGNOSIS — I4891 Unspecified atrial fibrillation: Secondary | ICD-10-CM | POA: Diagnosis not present

## 2017-02-10 DIAGNOSIS — L723 Sebaceous cyst: Secondary | ICD-10-CM | POA: Diagnosis not present

## 2017-02-10 DIAGNOSIS — I6523 Occlusion and stenosis of bilateral carotid arteries: Secondary | ICD-10-CM | POA: Diagnosis not present

## 2017-02-10 DIAGNOSIS — F17200 Nicotine dependence, unspecified, uncomplicated: Secondary | ICD-10-CM | POA: Diagnosis not present

## 2017-02-10 DIAGNOSIS — I480 Paroxysmal atrial fibrillation: Secondary | ICD-10-CM | POA: Diagnosis not present

## 2017-02-10 DIAGNOSIS — Z8249 Family history of ischemic heart disease and other diseases of the circulatory system: Secondary | ICD-10-CM | POA: Diagnosis not present

## 2017-02-10 DIAGNOSIS — R69 Illness, unspecified: Secondary | ICD-10-CM | POA: Diagnosis not present

## 2017-02-10 DIAGNOSIS — I6529 Occlusion and stenosis of unspecified carotid artery: Secondary | ICD-10-CM | POA: Diagnosis not present

## 2017-02-10 DIAGNOSIS — R41841 Cognitive communication deficit: Secondary | ICD-10-CM | POA: Diagnosis not present

## 2017-02-10 DIAGNOSIS — M810 Age-related osteoporosis without current pathological fracture: Secondary | ICD-10-CM | POA: Diagnosis not present

## 2017-02-10 DIAGNOSIS — I69321 Dysphasia following cerebral infarction: Secondary | ICD-10-CM | POA: Diagnosis not present

## 2017-02-10 DIAGNOSIS — I714 Abdominal aortic aneurysm, without rupture: Secondary | ICD-10-CM | POA: Diagnosis not present

## 2017-02-10 DIAGNOSIS — Z79899 Other long term (current) drug therapy: Secondary | ICD-10-CM | POA: Diagnosis not present

## 2017-02-10 DIAGNOSIS — Z923 Personal history of irradiation: Secondary | ICD-10-CM | POA: Diagnosis not present

## 2017-02-10 DIAGNOSIS — I639 Cerebral infarction, unspecified: Secondary | ICD-10-CM | POA: Diagnosis not present

## 2017-02-10 DIAGNOSIS — I481 Persistent atrial fibrillation: Secondary | ICD-10-CM | POA: Diagnosis not present

## 2017-02-10 DIAGNOSIS — F419 Anxiety disorder, unspecified: Secondary | ICD-10-CM | POA: Diagnosis not present

## 2017-02-10 DIAGNOSIS — R278 Other lack of coordination: Secondary | ICD-10-CM | POA: Diagnosis not present

## 2017-02-10 DIAGNOSIS — R1312 Dysphagia, oropharyngeal phase: Secondary | ICD-10-CM | POA: Diagnosis not present

## 2017-02-10 DIAGNOSIS — R634 Abnormal weight loss: Secondary | ICD-10-CM | POA: Diagnosis not present

## 2017-02-10 DIAGNOSIS — Z8521 Personal history of malignant neoplasm of larynx: Secondary | ICD-10-CM | POA: Diagnosis not present

## 2017-02-10 MED ORDER — ROSUVASTATIN CALCIUM 10 MG PO TABS: 10.0000 mg | ORAL_TABLET | Freq: Every day | ORAL | Status: DC

## 2017-02-10 MED ORDER — CLOPIDOGREL BISULFATE 75 MG PO TABS: 75.0000 mg | ORAL_TABLET | Freq: Every day | ORAL | Status: DC

## 2017-02-10 MED ORDER — SENNOSIDES-DOCUSATE SODIUM 8.6-50 MG PO TABS: 1.0000 | ORAL_TABLET | Freq: Every evening | ORAL | Status: DC | PRN

## 2017-02-10 NOTE — NC FL2 (Signed)
[place] LEVEL OF CARE SCREENING TOOL     IDENTIFICATION  Patient Name: Daniel Chapman Birthdate: June 29, 1931 Sex: male Admission Date (Current Location): 02/06/2017  [facility] and [place] Number:  Herbalist and Address:  [facility] [place], [place] [address] [place], [place] [address]      Provider Number: [id number]  Attending Physician Name and Address:  Mendel Corning, MD  Relative Name and Phone Number:       Current Level of Care: Hospital Recommended Level of Care: [place] Prior Approval Number:    Date Approved/Denied:   PASRR Number: [id number]  Discharge Plan: SNF    Current Diagnoses: Patient Active Problem List   Diagnosis Date Noted  . Carotid occlusion, right   . Stroke ([place]) 02/06/2017  . Atrial fibrillation, new onset ([place]) 02/06/2017  . Ischemic stroke ([place])   . Sebaceous cyst 01/03/2017  . Encounter for Medicare annual wellness exam 12/17/2014  . Special screening for malignant neoplasms, colon 12/17/2014  . Routine general medical examination at a health care facility 12/09/2014  . Aneurysm of abdominal vessel ([place]) 09/11/2013  . S/P total hip arthroplasty 08/02/2013  . H/O splenectomy 08/02/2013  . History of hip fracture 07/20/2013  . Occlusion and stenosis of carotid artery without mention of cerebral infarction 08/28/2012  . Carotid stenosis 08/14/2012  . Bruit of left carotid artery 08/01/2012  . Anxiety 01/27/2012  . Osteopenia 07/19/2011  . Prostate cancer screening 07/10/2011  . HOARSENESS, CHRONIC 02/05/2007  . 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 Hyperlipidemia 11/06/2006  . TOBACCO ABUSE 11/06/2006  . COPD (chronic obstructive pulmonary disease) ([place]) 11/06/2006  . COLONIC POLYPS, HX OF 11/06/2006  . PROSTATITIS, HX OF 11/06/2006  . Carcinoma of vocal cord ([place]) 05/30/2006    Orientation RESPIRATION BLADDER Height & Weight     Self, Time, Situation, Place  Normal Continent Weight: 124 lb 11.2 oz (56.6 kg) Height:  5\' 7"  (170.2 cm)  BEHAVIORAL SYMPTOMS/MOOD NEUROLOGICAL BOWEL NUTRITION STATUS      Continent Diet (see DC summary)  AMBULATORY STATUS COMMUNICATION OF NEEDS Skin   Limited Assist Verbally Normal                       Personal Care Assistance Level of Assistance  Bathing, Dressing Bathing Assistance: Limited assistance   Dressing Assistance: Limited assistance     Functional Limitations Info             SPECIAL CARE FACTORS FREQUENCY  PT (By licensed PT), OT (By licensed OT)     PT Frequency: 5x/wk OT Frequency: 5x/wk            Contractures      Additional Factors Info  Code Status, Allergies Code Status Info: Full Allergies Info: NKA           Current Medications (02/10/2017):  This is the current hospital active medication list Current Facility-Administered Medications  Medication Dose Route Frequency Provider Last Rate Last Dose  . acetaminophen (TYLENOL) tablet 650 mg  650 mg Oral Q4H PRN Radene Gunning, NP       Or  . acetaminophen (TYLENOL) solution 650 mg  650 mg Per Tube Q4H PRN Radene Gunning, NP       Or  . acetaminophen (TYLENOL) suppository 650 mg  650 mg Rectal Q4H PRN Radene Gunning, NP      . clopidogrel (PLAVIX) tablet 75 mg  75 mg  Oral Daily Rosalin Hawking, MD   75 mg at 02/09/17 1019  . enoxaparin (LOVENOX) injection 40 mg  40 mg Subcutaneous Q24H Radene Gunning, NP   40 mg at 02/09/17 2313  . ondansetron (ZOFRAN) injection 4 mg  4 mg Intravenous Q6H PRN Mikhail, Velta Addison, DO      . rosuvastatin (CRESTOR) tablet 10 mg  10 mg Oral q1800 Dorothy Spark, MD   10 mg at 02/07/17 1750  . senna-docusate (Senokot-S) tablet 1 tablet  1 tablet Oral QHS PRN Black, Lezlie Octave, NP         Discharge Medications: Please see discharge summary for a list of discharge medications.  Relevant Imaging Results:  Relevant Lab Results:   Additional Information SS#: [id number]  Geralynn Ochs, LCSW

## 2017-02-10 NOTE — Clinical Social Work Placement (Signed)
   CLINICAL SOCIAL WORK PLACEMENT  NOTE  Date:  02/10/2017  Patient Details  Name: Daniel Chapman MRN: [id number] Date of Birth: [birth year]  Clinical Social Work is seeking post-discharge placement for this patient at the [place] level of care (*CSW will initial, date and re-position this form in  chart as items are completed):  Yes   Patient/family provided with [place] Work Department's list of facilities offering this level of care within the geographic area requested by the patient (or if unable, by the patient's family).  Yes   Patient/family informed of their freedom to choose among providers that offer the needed level of care, that participate in Medicare, Medicaid or managed care program needed by the patient, have an available bed and are willing to accept the patient.  Yes   Patient/family informed of 's ownership interest in [facility] and [facility], as well as of the fact that they are under no obligation to receive care at these facilities.  PASRR submitted to EDS on       PASRR number received on       Existing PASRR number confirmed on       FL2 transmitted to all facilities in geographic area requested by pt/family on       FL2 transmitted to all facilities within larger geographic area on 02/09/17     Patient informed that his/her managed care company has contracts with or will negotiate with certain facilities, including the following:        Yes   Patient/family informed of bed offers received.  Patient chooses bed at [facility]     Physician recommends and patient chooses bed at      Patient to be transferred to [facility] on 02/10/17.  Patient to be transferred to facility by Family car     Patient family notified on 02/10/17 of transfer.  Name of family member notified:  [name]     PHYSICIAN       Additional Comment:    _______________________________________________ Geralynn Ochs, LCSW 02/10/2017, 4:48 PM

## 2017-02-10 NOTE — Telephone Encounter (Signed)
Caller Name:Barbars Saulters  Relationship to Patient:spouse Best [phone] or [phone] Pharmacy:  Reason for call: wife called, she would like to talk with you about husband. He has had a stroke, and they are wanting to discharge today. Wife isn't comfortable with him going home today.  Wife is request cb

## 2017-02-10 NOTE — Care Management Note (Signed)
Case Management Note  Patient Details  Name: DIN BOOKWALTER MRN: [id number] Date of Birth: [birth year]  Subjective/Objective:                    Action/Plan: Pt discharging to [facility]. No further needs per CM.  Expected Discharge Date:  02/10/17               Expected Discharge Plan:  IP Rehab Facility  In-House Referral:  Clinical Social Work  Discharge planning Services  CM Consult  Post Acute Care Choice:    Choice offered to:     DME Arranged:    DME Agency:     HH Arranged:    [place] Agency:     Status of Service:  Completed, signed off  If discussed at [employer], dates discussed:    Additional Comments:  Pollie Friar, RN 02/10/2017, 4:00 PM

## 2017-02-10 NOTE — Telephone Encounter (Signed)
Advise pt of Dr. Marliss Coots comments and recommendations and she will request the social worker consultation because she feels very lost and doesn't feel like they have been communicating pt's care plan to her

## 2017-02-10 NOTE — Discharge Summary (Signed)
Physician Discharge Summary   Patient ID: Daniel Chapman MRN: [id number] DOB/AGE: 06-06-1931 83 y.o.  Admit date: 02/06/2017 Discharge date: 02/10/2017  Primary Care Physician:  Tower, [name], MD  Discharge Diagnoses:     Acute CVA likely embolic in the setting of newly diagnosed atrial fibrillation . Aneurysm of abdominal vessel ([place]) . Anxiety . Carotid stenosis . COPD (chronic obstructive pulmonary disease) ([place]) . Hyperlipidemia . TOBACCO ABUSE . Atrial fibrillation, new onset ([place]), permanent  . Carcinoma of vocal cord [facility])   Consults:  neurology  Recommendations for Outpatient Follow-up:   1. On 02/13/2017, please start patient on eliquis 2.5 mg twice a day. Once eliquis started, Plavix can be discontinued 2. Please repeat CBC/BMET at next visit 3. Patient will need to be followed by speech therapy at the facility to upgrade diet   DIET: full liquid diet, thin liquids   Allergies:  No Known Allergies   DISCHARGE MEDICATIONS: Current Discharge Medication List    START taking these medications   Details  clopidogrel (PLAVIX) 75 MG tablet Take 1 tablet (75 mg total) by mouth daily. Discontinue when eliquis is started    rosuvastatin (CRESTOR) 10 MG tablet Take 1 tablet (10 mg total) by mouth daily at 6 PM.    senna-docusate (SENOKOT-S) 8.6-50 MG tablet Take 1 tablet by mouth at bedtime as needed for moderate constipation.      CONTINUE these medications which have NOT CHANGED   Details  calcium carbonate (TUMS EX) 750 MG chewable tablet Chew 1 tablet by mouth daily. Reported on 09/15/2015    cholecalciferol (VITAMIN D) 1000 UNITS tablet Take 1,000 Units by mouth daily. Reported on 09/15/2015    sertraline (ZOLOFT) 100 MG tablet TAKE 1/2 TABLET EVERY DAY  FOR  DEPRESSION Qty: 45 tablet, Refills: 3         Brief H and P: For complete details please refer to admission H and P, but in brief HPI on 02/06/2017 by Ms. [name], NP [name] a 83  y.o.malewith medical history significant for COPD not on home oxygen, tobacco use, vocal cord cancer, ITP, hyperlipidemia, carotid artery occlusion presents toemergency Department chief complaint dizziness fall yesterday. Initial evaluation reveals afib new onset andincludes a CT of the headrevealing Subacute and completed infarction of the right posterior inferior cerebellar territory. No intracranial hemorrhage, with minimal local mass effect secondary to edema.  Information is obtained from the patient and his wife is at the bedside. Patient is unable to articulate factly what happened yesterday. He can say that he became dizzy and fell down. He then got himself up to the chair. He does not remember vomiting but wife says when she came home from the grocery store she found him sitting in the chair with emesis on his shirt. In addition she found the house" disarray". Patient denies any headache visual disturbances numbness tingling of extremities. He denies chest pain palpitations shortness of breath. He denies nausea and does not remember vomiting. He denies diaphoresis lower extremity edema.He denies any fever chills recent travel or sick contacts. He denies dysuria hematuria frequency or urgency. He denies diarrhea constipation melena bright red blood per rectum. Wife states he appeared to have difficulty talking initially but that improved as the evening wore on. He slept through the night without any problems. She called the PCP this morning to report what happened and they recommend she come to the emergency department   Hospital Course:   Acute CVA Likely embolic in the  setting of newly diagnosed atrial fibrillation - CT head showed subacute and completed infarction of the right posterior inferior cerebellar territory. No intracranial hemorrhage with minimal local mass effect secondary to edema. - MRI/MRA showed right inferior cerebellum with late acute/early subacute infarction. Right ICA  occlusion with reconstitution at the paraclinoid segment - 2-D echo with EF of 65-70% - CT angiogram of the head and neck showed advanced atherosclerotic disease of the thoracic aorta, no aneurysm or dissection, 40% stenosis in the midportion of right common carotid artery, complete occlusion of the right ICA adnexal region. Webb or focal dissection of the left subclavian artery just proximal to left vertebral artery region. Severe stenosis of the left vertebral artery lesion 80% or greater. - LDL 96, hemoglobin A1c 6.2 - Per neurology, continue Plavix, statin. Aspirin held secondary to history of ITP. Per neurology, start patient on eliquis on 02/13/17, Plavix can be discontinued once patient is started on eliquis.  -Seen by speech therapist, placed on a liquid diet     COPD (chronic obstructive pulmonary disease) ([place]), tobacco abuse - CT angiogram of the head and neck showed advanced chronic lung disease with advanced emphysema, pleural and parenchymal scarring at both apices with increased density compared to CT chest of 2011 cannot rule out the possibility of tumor in this region though this may be progressive scarring. - CT chest showed extensive scarring in the lung apices, similar to prior studies, multiple tiny pulmonary nodules scattered throughout the lung bilaterally similar to prior study in 2011, considered benign. Noncontrast chest CT can be considered in 12 months if patient is high risk. - Currently no wheezing, stable  New onset atrial fibrillation, Permanent - Currently on Plavix  - Given patient's size of stroke, neurology recommended waiting 7 days before starting anticoagulation, eliquis in 7 days, on 02/13/17  - once eliquis started, Plavix can be discontinued  History of ITP - Status post splenectomy, recommended to avoid aspirin  Right ICA chronic occlusion - Followed by vascular surgery as outpatient, BP goal 130 to 150  Hyperlipidemia - LDL 96, goal less than  70, started on Crestor 10 mg daily  Oropharyngeal dysphagia: Likely due to stroke - swallow evaluation done, recommended full liquid diet. Patient should be followed by speech therapy at the facility to upgrade diet.  Day of Discharge BP 131/77 (BP Location: Left Arm)   Pulse 78   Temp 98.2 F (36.8 C) (Oral)   Resp 18   Ht 5' 7" (1.702 m)   Wt 56.6 kg (124 lb 11.2 oz)   SpO2 100%   BMI 19.53 kg/m   Physical Exam: General: Alert and awake oriented x3 not in any acute distress.hearing deficit HEENT: anicteric sclera, pupils reactive to light and accommodation CVS: S1 and S2 clear, 2/6 SEM, irregular Chest: clear to auscultation bilaterally, no wheezing rales or rhonchi Abdomen: soft nontender, nondistended, normal bowel sounds Extremities: no cyanosis, clubbing or edema noted bilaterally Neuro: no new deficits   The results of significant diagnostics from this hospitalization (including imaging, microbiology, ancillary and laboratory) are listed below for reference.    LAB RESULTS: Basic Metabolic Panel:  Recent Labs Lab 02/06/17 0947 02/06/17 1009 02/08/17 0325  NA 137 138 135  K 4.1 4.1 3.6  CL 104 103 105  CO2 24  --  24  GLUCOSE 129* 128* 102*  BUN _0 CREATININE 0.89 0.80 0.76  CALCIUM 9.3  --  8.8*   Liver Function Tests:  Recent Labs Lab  02/06/17 0947  AST 29  ALT 17  ALKPHOS 52  BILITOT 1.0  PROT 7.4  ALBUMIN 3.7   No results for input(s): LIPASE, AMYLASE in the last 168 hours. No results for input(s): AMMONIA in the last 168 hours. CBC:  Recent Labs Lab 02/06/17 0947 02/06/17 1009 02/08/17 0325  WBC 9.9  --  10.0  NEUTROABS 7.2  --   --   HGB 13.0 13.9 12.8*  HCT 40.4 41.0 38.4*  MCV 90.4  --  89.1  PLT 224  --  230   Cardiac Enzymes: No results for input(s): CKTOTAL, CKMB, CKMBINDEX, TROPONINI in the last 168 hours. BNP: Invalid input(s): POCBNP CBG:  Recent Labs Lab 02/06/17 1004  GLUCAP 123*    Significant  Diagnostic Studies:  Ct Angio Head W Or Wo Contrast  Result Date: 02/06/2017 CLINICAL DATA:  Acute right cerebellar infarction. EXAM: CT ANGIOGRAPHY HEAD AND NECK TECHNIQUE: Multidetector CT imaging of the head and neck was performed using the standard protocol during bolus administration of intravenous contrast. Multiplanar CT image reconstructions and MIPs were obtained to evaluate the vascular anatomy. Carotid stenosis measurements (when applicable) are obtained utilizing NASCET criteria, using the distal internal carotid diameter as the denominator. CONTRAST:  50 cc Isovue 370 COMPARISON:  Head CT same day FINDINGS: CTA NECK FINDINGS Aortic arch: Aortic atherosclerosis. No aneurysm or dissection. Branching pattern of the brachiocephalic vessels from the arch is normal. Atherosclerotic disease at the brachiocephalic vessel origins but without stenosis greater than about 20%. Right carotid system: Common carotid artery shows atherosclerotic disease with narrowing in the mid portion of 40%. Occlusion of the right ICA at the origin. No antegrade flow in the ICA at the skullbase. Left carotid system: Common carotid artery widely patent to the bifurcation region. Motion degradation at the bifurcation. Streak artifact at the bifurcation. Atherosclerotic disease with soft and calcified plaque. Stenosis of the proximal ICA estimated at 50%. Motion degradation in the cervical ICA region without suspicion of flow limiting stenosis in that region. Vertebral arteries: Right vertebral artery origin widely patent. No subclavian stenosis proximal or distal to that. Right vertebral artery is widely patent through the cervical region to the foramen magnum. Left subclavian artery shows a focal way lobe or localized dissection just proximal to the left vertebral artery origin. There is severe stenosis at the left vertebral artery origin, 80% or greater. The vessel is patent beyond [facility] through the cervical region to the foramen  magnum. Skeleton: Ordinary mid cervical spondylosis. Other neck: No mass or lymphadenopathy. Benign appearing sebaceous cyst of the right lower neck. Upper chest: Extensive upper lobe emphysema with pleural and parenchymal scarring. Regions of scarring appear masslike and are progressive since the most recent chest CT of 05/05/2010. Depending on the clinical status, complete chest CT would be suggested if the patient is a candidate for treatment of pulmonary disease. Review of the MIP images confirms the above findings CTA HEAD FINDINGS Anterior circulation: Left internal carotid artery is patent through the skullbase and siphon regions. There is some external to internal collateral flow with reconstitution of the distal siphon and supraclinoid ICA on the right. There are also patent anterior and posterior communicating arteries. No ACA or MCA stenosis. No aneurysm or vascular malformation. Posterior circulation: Both vertebral arteries are patent at the foramen magnum level. Flow is present in both posterior inferior cerebellar artery is presently. Both vertebral artery supply the basilar. No basilar stenosis. Left anterior inferior cerebellar artery, superior cerebellar arteries and posterior cerebral  arteries are patent bilaterally without evidence of flow limiting stenosis or missing components. It is not possible to determine if the right anterior inferior cerebellar artery is occluded or absent developmentally. Venous sinuses: Patent and normal Anatomic variants: None other significant Delayed phase: No abnormal enhancement Review of the MIP images confirms the above findings IMPRESSION: Advanced atherosclerotic disease of the thoracic aorta but without evidence of aneurysm or dissection. Atherosclerotic disease at the brachiocephalic vessel origins but without stenosis greater than 20%. 40% stenosis in the midportion of the right common carotid artery. Complete occlusion of the right internal carotid artery  at its origin. Atherosclerotic disease at the left carotid bifurcation. Detail is limited by artifact and motion. Stenosis is estimated at 50% at the proximal ICA. Right vertebral artery widely patent through the neck. Web or focal dissection of the left subclavian artery just proximal to the left vertebral artery origin. Severe stenosis of the left vertebral artery origin, 80% or greater. The vessel is widely patent beyond that. Both posterior inferior cerebellar arteries are patent presently. No visible anterior inferior cerebellar artery on the right. This could be occluded or congenitally absent. Right ICA reconstitution in the supraclinoid region secondary to external to internal collaterals, patent anterior communicating artery and patent posterior communicating artery. Parenchymal distributions of the anterior and middle cerebral vessels appear intact. Advanced chronic lung disease with advanced emphysema. Pleural and parenchymal scarring at both apices, with increased density compared to the CT chest of 2011. I cannot rule out the possibility of tumor in this region, though this may merely be progressive scarring. If the patient is a candidate for treatment pulmonary disease, complete chest CT would be warranted. Electronically Signed   By: Nelson Chimes M.D.   On: 02/06/2017 14:33   Dg Chest 2 View  Result Date: 02/06/2017 CLINICAL DATA:  Stroke.  No chest complaints. EXAM: CHEST  2 VIEW COMPARISON:  07/19/2011 FINDINGS: Hyperinflation. Lateral view degraded by patient arm position. Midthoracic compression deformity is mild to moderate and similar. Numerous leads and wires project over the chest. Midline trachea. Normal heart size and mediastinal contours for age. Left costophrenic angle excluded from the frontal radiograph. No pleural effusion or pneumothorax. Biapical pleural-parenchymal scarring. Diffuse peribronchial thickening. IMPRESSION: 1.  No acute cardiopulmonary disease. 2. COPD/chronic  bronchitis. Pleural-parenchymal scarring at the apices. Electronically Signed   By: Abigail Miyamoto M.D.   On: 02/06/2017 12:42   Ct Head Wo Contrast  Result Date: 02/06/2017 CLINICAL DATA:  83 year old male with a history of dizziness EXAM: CT HEAD WITHOUT CONTRAST TECHNIQUE: Contiguous axial images were obtained from the base of the skull through the vertex without intravenous contrast. COMPARISON:  None. FINDINGS: Brain: Confluent hypodense territory involving the inferior and medial right cerebellar hemisphere. No acute intracranial hemorrhage. Minimal mass effect resulting in slight narrowing of the fourth ventricle. Unremarkable configuration of the ventricles. No supratentorial hemorrhage, midline shift, mass effect. Gray-white differentiation of the anterior circulation maintained. Vascular: Calcifications of intracranial vasculature. Sagittal reformatted images demonstrate questionable hyperdensity of the vertebrobasilar junction/inferior basilar artery. Skull: No acute bony abnormality.  No aggressive bone lesion. Sinuses/Orbits: No significant atherosclerotic changes. Other: None IMPRESSION: Subacute and completed infarction of the right posterior inferior cerebellar territory. No intracranial hemorrhage, with minimal local mass effect secondary to edema. Sagittal reformatted images demonstrate questionable hyperdensity of the vertebrobasilar junction and the inferior basilar artery, potentially representing thrombus. CT angiogram may be useful. Electronically Signed   By: Corrie Mckusick D.O.   On: 02/06/2017 11:07  Ct Angio Neck W Or Wo Contrast  Result Date: 02/06/2017 CLINICAL DATA:  Acute right cerebellar infarction. EXAM: CT ANGIOGRAPHY HEAD AND NECK TECHNIQUE: Multidetector CT imaging of the head and neck was performed using the standard protocol during bolus administration of intravenous contrast. Multiplanar CT image reconstructions and MIPs were obtained to evaluate the vascular anatomy.  Carotid stenosis measurements (when applicable) are obtained utilizing NASCET criteria, using the distal internal carotid diameter as the denominator. CONTRAST:  50 cc Isovue 370 COMPARISON:  Head CT same day FINDINGS: CTA NECK FINDINGS Aortic arch: Aortic atherosclerosis. No aneurysm or dissection. Branching pattern of the brachiocephalic vessels from the arch is normal. Atherosclerotic disease at the brachiocephalic vessel origins but without stenosis greater than about 20%. Right carotid system: Common carotid artery shows atherosclerotic disease with narrowing in the mid portion of 40%. Occlusion of the right ICA at the origin. No antegrade flow in the ICA at the skullbase. Left carotid system: Common carotid artery widely patent to the bifurcation region. Motion degradation at the bifurcation. Streak artifact at the bifurcation. Atherosclerotic disease with soft and calcified plaque. Stenosis of the proximal ICA estimated at 50%. Motion degradation in the cervical ICA region without suspicion of flow limiting stenosis in that region. Vertebral arteries: Right vertebral artery origin widely patent. No subclavian stenosis proximal or distal to that. Right vertebral artery is widely patent through the cervical region to the foramen magnum. Left subclavian artery shows a focal way lobe or localized dissection just proximal to the left vertebral artery origin. There is severe stenosis at the left vertebral artery origin, 80% or greater. The vessel is patent beyond [facility] through the cervical region to the foramen magnum. Skeleton: Ordinary mid cervical spondylosis. Other neck: No mass or lymphadenopathy. Benign appearing sebaceous cyst of the right lower neck. Upper chest: Extensive upper lobe emphysema with pleural and parenchymal scarring. Regions of scarring appear masslike and are progressive since the most recent chest CT of 05/05/2010. Depending on the clinical status, complete chest CT would be suggested if the  patient is a candidate for treatment of pulmonary disease. Review of the MIP images confirms the above findings CTA HEAD FINDINGS Anterior circulation: Left internal carotid artery is patent through the skullbase and siphon regions. There is some external to internal collateral flow with reconstitution of the distal siphon and supraclinoid ICA on the right. There are also patent anterior and posterior communicating arteries. No ACA or MCA stenosis. No aneurysm or vascular malformation. Posterior circulation: Both vertebral arteries are patent at the foramen magnum level. Flow is present in both posterior inferior cerebellar artery is presently. Both vertebral artery supply the basilar. No basilar stenosis. Left anterior inferior cerebellar artery, superior cerebellar arteries and posterior cerebral arteries are patent bilaterally without evidence of flow limiting stenosis or missing components. It is not possible to determine if the right anterior inferior cerebellar artery is occluded or absent developmentally. Venous sinuses: Patent and normal Anatomic variants: None other significant Delayed phase: No abnormal enhancement Review of the MIP images confirms the above findings IMPRESSION: Advanced atherosclerotic disease of the thoracic aorta but without evidence of aneurysm or dissection. Atherosclerotic disease at the brachiocephalic vessel origins but without stenosis greater than 20%. 40% stenosis in the midportion of the right common carotid artery. Complete occlusion of the right internal carotid artery at its origin. Atherosclerotic disease at the left carotid bifurcation. Detail is limited by artifact and motion. Stenosis is estimated at 50% at the proximal ICA. Right vertebral artery widely  patent through the neck. Web or focal dissection of the left subclavian artery just proximal to the left vertebral artery origin. Severe stenosis of the left vertebral artery origin, 80% or greater. The vessel is widely  patent beyond that. Both posterior inferior cerebellar arteries are patent presently. No visible anterior inferior cerebellar artery on the right. This could be occluded or congenitally absent. Right ICA reconstitution in the supraclinoid region secondary to external to internal collaterals, patent anterior communicating artery and patent posterior communicating artery. Parenchymal distributions of the anterior and middle cerebral vessels appear intact. Advanced chronic lung disease with advanced emphysema. Pleural and parenchymal scarring at both apices, with increased density compared to the CT chest of 2011. I cannot rule out the possibility of tumor in this region, though this may merely be progressive scarring. If the patient is a candidate for treatment pulmonary disease, complete chest CT would be warranted. Electronically Signed   By: Nelson Chimes M.D.   On: 02/06/2017 14:33   Mr Brain Wo Contrast  Result Date: 02/06/2017 CLINICAL DATA:  83 y/o  M; follow-up of stroke. EXAM: MRI HEAD WITHOUT CONTRAST MRA HEAD WITHOUT CONTRAST TECHNIQUE: Multiplanar, multiecho pulse sequences of the brain and surrounding structures were obtained without intravenous contrast. Angiographic images of the head were obtained using MRA technique without contrast. COMPARISON:  02/06/2017 CT of the head and CTA of the head. FINDINGS: MRI HEAD FINDINGS Brain: Reduced diffusion in the right inferior cerebellum. Associated T2 hyperintense signal abnormality and mild local mass effect compatible with late acute/ early subacute infarction. Unsure diffusion signal abnormality in the brain. No abnormal susceptibility hypointensity to indicate intracranial hemorrhage. No hydrocephalus or effacement of basilar cisterns. Background of few scattered nonspecific foci of T2 FLAIR hyperintense signal abnormality in subcortical and periventricular white matter is compatible with mild chronic microvascular ischemic changes for age. Mild brain  parenchymal volume loss. Vascular: As below. Skull and upper cervical spine: Normal marrow signal. Sinuses/Orbits: Mild right maxillary sinus mucosal thickening. Bilateral intra-ocular lens replacement. No abnormal signal of mastoid air cells Other: None. MRA HEAD FINDINGS Internal carotid arteries: Patent left internal carotid artery. Mild irregularity of the cavernous segment without stenosis compatible with intracranial atherosclerosis. The right internal carotid artery is occluded to the paraclinoid segment where it is reconstituted to the terminus. Anterior cerebral arteries:  Patent. Middle cerebral arteries: Patent. Anterior communicating artery: Patent. Posterior communicating arteries: Large right and possible diminutive left posterior communicating arteries. Posterior cerebral arteries:  Patent. Basilar artery:  Patent. Vertebral arteries:  Patent. No evidence of high-grade stenosis, large vessel occlusion, or aneurysm unless noted above. IMPRESSION: 1. Right inferior cerebellum late acute/early subacute infarction stable in distribution in comparison with prior CT of the head given differences in technique. No acute hemorrhage. 2. Stable background of mild chronic microvascular ischemic changes and mild parenchymal volume loss of the brain. 3. Right ICA occlusion with reconstitution at the paraclinoid segment to the terminus. Large right posterior communicating artery. 4. Patent circle of Willis. No large vessel occlusion, aneurysm, or significant stenosis is identified. Electronically Signed   By: Kristine Garbe M.D.   On: 02/06/2017 19:40   Dg Swallowing Func-speech Pathology  Result Date: 02/07/2017 Objective Swallowing Evaluation: Type of Study: MBS-Modified Barium Swallow Study Patient Details Name: Daniel Chapman MRN: [id number] Date of Birth: [birth year] Today's Date: 02/07/2017 Time: SLP Start Time (ACUTE ONLY): 0819-SLP Stop Time (ACUTE ONLY): 0845 SLP Time Calculation (min) (ACUTE  ONLY): 26 min Past Medical History: Past Medical History: Diagnosis  Date . 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 Carotid artery occlusion  . COPD (chronic obstructive pulmonary disease) ([place])  . Fall due to ice or snow Feb. 22, 2015  Partial Right Hip replaced . History of ITP  . Hx of colonic polyps  . Hyperlipemia  . Osteopenia   ? Mar[place] Kitchen Osteoporosis   dexa 06, declines further dexa or tx  . Smoker  Past Surgical History: Past Surgical History: Procedure Laterality Date . BONE MARROW BIOPSY  05/2003 . CATARACT EXTRACTION   . Dexa    per patient ? osteopenia . HEMORRHOID SURGERY   . JOINT REPLACEMENT Right Feb. 22, 2015  Partial Hip . SPLENECTOMY   . TONSILLECTOMY AND ADENOIDECTOMY Bilateral age 38 per patient HPI: Mr. Danielsen is an 83 y.o.male with h/o HLD, vocal cord cancer, tobacco use and COPDwho presented to the ED today for evaluation of new onset dizziness and incoordination. Symptoms also included difficulty speaking, headache, nausea and vomiting. He was found by his wife on the floor beside vomit and a coffee pot he knocked down. Initially pt was unable to speak, but eventually he was able to state that he was hit with "a bout of dizziness. He has vomited three times since Sunday.   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  Swallow evaluation ordered, given h/o vocal fold cancer s/p XRT, SLP to proceed with MBS.  Subjective: pt awake in chair Assessment / Plan / Recommendation CHL IP CLINICAL IMPRESSIONS 02/07/2017 Clinical Impression Baseline coughing and expectorating secretions noted.  Pt presents with moderate pharyngeal dysphagia - suspect exacerbation of baseline deficits *known h/o dysphagia from vocal cord cancer s/p XRT.  Decreased pharyngeal contraction/tongue base retraction and epiglottic deflection results in residuals (vallecular worse than pyriform sinus) without pt consistent awareness.   Cued dry swallow helpful to decrease residuals that pt does NOT sense and following solid with  liquid helpful.  Pt suspected to aspirate with consumption of thin via straw *although missed actual swallow on flouro*.  Pt overtly coughed immediately postswallow of thin via straw - with at least laryngeal penetration observed.  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  Cough with backflow of barium into pharynx and vomiting observed - suspect due to cerebellar cva.  NO aspiration of emesis noted.  Recommend start clear liquid diet with strict precautions.  Using teach back, pt was educated to findings/recommendations thoroughly.   SLP Visit Diagnosis Dysphagia, oropharyngeal phase (R13.12) Attention and concentration deficit following -- Frontal lobe and executive function deficit following -- Impact on safety and function Moderate aspiration risk   CHL IP TREATMENT RECOMMENDATION 02/07/2017 Treatment Recommendations F/U MBS in --- days (Comment)   Prognosis 02/07/2017 Prognosis for Safe Diet Advancement Fair Barriers to Reach Goals Severity of deficits Barriers/Prognosis Comment -- CHL IP DIET RECOMMENDATION 02/07/2017 SLP Diet Recommendations Thin liquid Liquid Administration via Cup;No straw Medication Administration Other (Comment) Compensations Small sips/bites;Slow rate;Follow solids with liquid;Multiple dry swallows after each bite/sip;Other (Comment) Postural Changes Seated upright at 90 degrees;Remain semi-upright after after feeds/meals (Comment)   CHL IP OTHER RECOMMENDATIONS 02/07/2017 Recommended Consults -- Oral Care Recommendations Oral care before and after PO Other Recommendations --   CHL IP FOLLOW UP RECOMMENDATIONS 02/07/2017 Follow up Recommendations None   CHL IP FREQUENCY AND DURATION 02/07/2017 Speech Therapy Frequency (ACUTE ONLY) min 2x/week Treatment Duration 2 weeks      CHL IP ORAL PHASE 02/07/2017 Oral Phase Impaired Oral - Pudding Teaspoon --  Oral - Pudding Cup -- Oral - Honey Teaspoon -- Oral - Honey Cup -- Oral - Nectar Teaspoon -- Oral  - Nectar Cup -- Oral - Nectar Straw -- Oral - Thin Teaspoon WFL Oral - Thin Cup WFL Oral - Thin Straw Premature spillage Oral - Puree WFL Oral - Mech Soft WFL Oral - Regular -- Oral - Multi-Consistency -- Oral - Pill -- Oral Phase - Comment --  CHL IP PHARYNGEAL PHASE 02/07/2017 Pharyngeal Phase Impaired Pharyngeal- Pudding Teaspoon -- Pharyngeal -- Pharyngeal- Pudding Cup -- Pharyngeal -- Pharyngeal- Honey Teaspoon -- Pharyngeal -- Pharyngeal- Honey Cup -- Pharyngeal -- Pharyngeal- Nectar Teaspoon Reduced pharyngeal peristalsis;Reduced epiglottic inversion;Reduced anterior laryngeal mobility;Reduced laryngeal elevation;Reduced airway/laryngeal closure;Reduced tongue base retraction;Pharyngeal residue - valleculae;Pharyngeal residue - pyriform Pharyngeal -- Pharyngeal- Nectar Cup Reduced anterior laryngeal mobility;Reduced laryngeal elevation;Reduced epiglottic inversion;Reduced pharyngeal peristalsis;Reduced tongue base retraction;Pharyngeal residue - valleculae;Pharyngeal residue - pyriform Pharyngeal -- Pharyngeal- Nectar Straw -- Pharyngeal -- Pharyngeal- Thin Teaspoon Reduced pharyngeal peristalsis;Reduced epiglottic inversion;Reduced anterior laryngeal mobility;Reduced laryngeal elevation;Reduced airway/laryngeal closure;Reduced tongue base retraction;Pharyngeal residue - valleculae;Pharyngeal residue - pyriform Pharyngeal -- Pharyngeal- Thin Cup Reduced pharyngeal peristalsis;Reduced epiglottic inversion;Reduced anterior laryngeal mobility;Reduced laryngeal elevation;Reduced airway/laryngeal closure;Reduced tongue base retraction;Pharyngeal residue - valleculae;Pharyngeal residue - pyriform Pharyngeal -- Pharyngeal- Thin Straw Reduced pharyngeal peristalsis;Reduced epiglottic inversion;Reduced anterior laryngeal mobility;Reduced laryngeal elevation;Reduced airway/laryngeal closure;Reduced tongue base retraction;Penetration/Aspiration during swallow Pharyngeal Material enters airway, passes BELOW cords and not  ejected out despite cough attempt by patient Pharyngeal- Puree Reduced pharyngeal peristalsis;Reduced epiglottic inversion;Reduced tongue base retraction;Pharyngeal residue - valleculae Pharyngeal -- Pharyngeal- Mechanical Soft Reduced pharyngeal peristalsis;Reduced epiglottic inversion;Reduced tongue base retraction;Pharyngeal residue - valleculae Pharyngeal -- Pharyngeal- Regular -- Pharyngeal -- Pharyngeal- Multi-consistency -- Pharyngeal -- Pharyngeal- Pill -- Pharyngeal -- Pharyngeal Comment CUED DRY SWALLOWS helpful to decrease residuals, "hock" to clear helpful but not fully effective  CHL IP CERVICAL ESOPHAGEAL PHASE 02/07/2017 Cervical Esophageal Phase Impaired Pudding Teaspoon -- Pudding Cup -- Honey Teaspoon -- Honey Cup -- Nectar Teaspoon -- Nectar Cup -- Nectar Straw -- Thin Teaspoon -- Thin Cup -- Thin Straw -- Puree -- Mechanical Soft -- Regular -- Multi-consistency -- Pill -- Cervical Esophageal Comment pt with strong coughing noted resulting in backflow of barium into pharynx and caused vomiting, upon esophageal sweep at end of MBS - pt appeared with clear esophagus No flowsheet data found. [name] 02/07/2017, 10:07 AM  Luanna Salk, MS [facility] SLP [phone]             Mr Daniel Chapman Head Wo Contrast  Result Date: 02/06/2017 CLINICAL DATA:  83 y/o  M; follow-up of stroke. EXAM: MRI HEAD WITHOUT CONTRAST MRA HEAD WITHOUT CONTRAST TECHNIQUE: Multiplanar, multiecho pulse sequences of the brain and surrounding structures were obtained without intravenous contrast. Angiographic images of the head were obtained using MRA technique without contrast. COMPARISON:  02/06/2017 CT of the head and CTA of the head. FINDINGS: MRI HEAD FINDINGS Brain: Reduced diffusion in the right inferior cerebellum. Associated T2 hyperintense signal abnormality and mild local mass effect compatible with late acute/ early subacute infarction. Unsure diffusion signal abnormality in the brain. No abnormal susceptibility  hypointensity to indicate intracranial hemorrhage. No hydrocephalus or effacement of basilar cisterns. Background of few scattered nonspecific foci of T2 FLAIR hyperintense signal abnormality in subcortical and periventricular white matter is compatible with mild chronic microvascular ischemic changes for age. Mild brain parenchymal volume loss. Vascular: As below. Skull and upper cervical spine: Normal marrow signal. Sinuses/Orbits: Mild right maxillary sinus mucosal thickening. Bilateral intra-ocular lens  replacement. No abnormal signal of mastoid air cells Other: None. MRA HEAD FINDINGS Internal carotid arteries: Patent left internal carotid artery. Mild irregularity of the cavernous segment without stenosis compatible with intracranial atherosclerosis. The right internal carotid artery is occluded to the paraclinoid segment where it is reconstituted to the terminus. Anterior cerebral arteries:  Patent. Middle cerebral arteries: Patent. Anterior communicating artery: Patent. Posterior communicating arteries: Large right and possible diminutive left posterior communicating arteries. Posterior cerebral arteries:  Patent. Basilar artery:  Patent. Vertebral arteries:  Patent. No evidence of high-grade stenosis, large vessel occlusion, or aneurysm unless noted above. IMPRESSION: 1. Right inferior cerebellum late acute/early subacute infarction stable in distribution in comparison with prior CT of the head given differences in technique. No acute hemorrhage. 2. Stable background of mild chronic microvascular ischemic changes and mild parenchymal volume loss of the brain. 3. Right ICA occlusion with reconstitution at the paraclinoid segment to the terminus. Large right posterior communicating artery. 4. Patent circle of Willis. No large vessel occlusion, aneurysm, or significant stenosis is identified. Electronically Signed   By: Kristine Garbe M.D.   On: 02/06/2017 19:40    2D ECHO: Study  Conclusions  - Left ventricle: The cavity size was normal. Wall thickness was   normal. Systolic function was vigorous. The estimated ejection   fraction was in the range of 65% to 70%. - Aortic valve: Moderately calcified annulus. Mildly thickened   leaflets. - Mitral valve: There was mild regurgitation.   Disposition and Follow-up: Discharge Instructions    Ambulatory referral to Neurology    Complete by:  As directed    Follow up with stroke clinic Cecille Rubin preferred, if not available, then consider Caesar Chestnut, [facility] or Jaynee Eagles whoever is available) at [facility] in about 6-8 weeks. Thanks.   Increase activity slowly    Complete by:  As directed        DISPOSITION:  snf    DISCHARGE FOLLOW-UP Follow-up Information    Dennie Bible, NP. Schedule an appointment as soon as possible for a visit in 6 week(s).   Specialty:  Family Medicine Contact information: [address] [place] [address] [phone]        Consuelo Pandy, PA-C Follow up.   Specialties:  Cardiology, Radiology Why:  [facility] (cardiology) - your follow-up will be on 02/23/17 at 11am. Arrive 15 minutes early to check in and bring all medications with you. Brittainy is one of the PAs that works closely with Dr. Meda Coffee. Contact information: [address] [place] [address] [phone]        Abner Greenspan, MD. Schedule an appointment as soon as possible for a visit in 2 week(s).   Specialties:  Family Medicine, Radiology Contact information: [place] [address] [phone]            Time spent on Discharge: 28mns   Signed:   REstill CottaM.D. [facility] 02/10/2017, 11:14 AM Pager: [phone]

## 2017-02-10 NOTE — Progress Notes (Signed)
Physical Therapy Treatment Patient Details Name: Daniel Chapman MRN: [id number] DOB: 01/11/1932 Today's Date: 02/10/2017    History of Present Illness 83 y.o. male with medical history significant for COPD not on home oxygen, tobacco use, vocal cord cancer, ITP, hyperlipidemia, carotid artery occlusion presents to emergency Department chief complaint dizziness & fall. MRI + R inferior  cerebellum infarct.    PT Comments    Pt making steady progress with mobility. Pt would continue to benefit from skilled physical therapy services at this time while admitted and after d/c to address the below listed limitations in order to improve overall safety and independence with functional mobility.    Follow Up Recommendations  SNF     Equipment Recommendations  None recommended by PT    Recommendations for Other Services       Precautions / Restrictions Precautions Precautions: Fall Restrictions Weight Bearing Restrictions: No    Mobility  Bed Mobility Overal bed mobility: Needs Assistance Bed Mobility: Supine to Sit     Supine to sit: Supervision     General bed mobility comments: supervision for safety  Transfers Overall transfer level: Needs assistance Equipment used: None Transfers: Sit to/from Stand Sit to Stand: Min assist         General transfer comment: min A for stability with transition into stand from EOB  Ambulation/Gait Ambulation/Gait assistance: Min guard;Min assist Ambulation Distance (Feet): 150 Feet Assistive device: None Gait Pattern/deviations: Step-through pattern;Decreased stride length;Trunk flexed Gait velocity: decreased Gait velocity interpretation: Below normal speed for age/gender General Gait Details: mild instability but pt joking frequently throughout and difficult to determine actual instability versus pt joking around; pt occasionally needing min A to maintain upright position especially with directional changes   Stairs             Wheelchair Mobility    Modified Rankin (Stroke Patients Only) Modified Rankin (Stroke Patients Only) Pre-Morbid Rankin Score: No symptoms Modified Rankin: Moderately severe disability     Balance Overall balance assessment: Needs assistance Sitting-balance support: Feet supported;No upper extremity supported Sitting balance-Leahy Scale: Fair     Standing balance support: During functional activity;No upper extremity supported Standing balance-Leahy Scale: Fair                              Cognition Arousal/Alertness: Awake/alert Behavior During Therapy: WFL for tasks assessed/performed Overall Cognitive Status: Impaired/Different from baseline Area of Impairment: Safety/judgement;Awareness                         Safety/Judgement: Decreased awareness of safety;Decreased awareness of deficits Awareness: Emergent          Exercises      General Comments        Pertinent Vitals/Pain Pain Assessment: No/denies pain    Home Living                      Prior Function            PT Goals (current goals can now be found in the care plan section) Acute Rehab PT Goals PT Goal Formulation: With patient Time For Goal Achievement: 02/21/17 Potential to Achieve Goals: Good Progress towards PT goals: Progressing toward goals    Frequency    Min 3X/week      PT Plan Current plan remains appropriate    Co-evaluation  AM-PAC PT "6 Clicks" Daily Activity  Outcome Measure  Difficulty turning over in bed (including adjusting bedclothes, sheets and blankets)?: None Difficulty moving from lying on back to sitting on the side of the bed? : None Difficulty sitting down on and standing up from a chair with arms (e.g., wheelchair, bedside commode, etc,.)?: Unable Help needed moving to and from a bed to chair (including a wheelchair)?: A Little Help needed walking in hospital room?: A Little Help needed climbing  3-5 steps with a railing? : A Little 6 Click Score: 18    End of Session Equipment Utilized During Treatment: Gait belt Activity Tolerance: Patient tolerated treatment well Patient left: in chair;with call bell/phone within reach;with chair alarm set Nurse Communication: Mobility status PT Visit Diagnosis: Unsteadiness on feet (R26.81);Ataxic gait (R26.0)     Time: 8563-1497 PT Time Calculation (min) (ACUTE ONLY): 12 min  Charges:  $Gait Training: 8-22 mins                    G Codes:       [place], [place], [place] 02/10/2017, 12:33 PM

## 2017-02-10 NOTE — Clinical Social Work Note (Signed)
Clinical Social Work Assessment  Patient Details  Name: Daniel Chapman MRN: [id number] Date of Birth: December 24, 1931  Date of referral:  02/09/17               Reason for consult:  Facility Placement                Permission sought to share information with:  Facility Sport and exercise psychologist, Family Supports Permission granted to share information::  Yes, Verbal Permission Granted  Name::     Daniel Chapman  Agency::  SNF  Relationship::  Wife, Daughter  Sport and exercise psychologist Information:     Housing/Transportation Living arrangements for the past 2 months:  Single Family Home Source of Information:  Patient, Spouse, Adult Children Patient Interpreter Needed:  None Criminal Activity/Legal Involvement Pertinent to Current Situation/Hospitalization:  No - Comment as needed Significant Relationships:  Adult Children, Spouse Lives with:  Self, Spouse Do you feel safe going back to the place where you live?  Yes Need for family participation in patient care:  No (Coment)  Care giving concerns:  Patient has been living at home with spouse, but currently needs more support and therapy in short term rehab prior to returning home to care for self with spouse's help.   Social Worker assessment / plan:  CSW met with patient, and later patient's wife and daughter at bedside to discuss recommendation for SNF. Patient requested that CSW discuss with patient's wife about rehab, but that he didn't feel that he was back to his baseline yet and would benefit from some therapy. Patient's wife agreed when she returned to the hospital. CSW provided list of facilities that contract with patient's insurance, and discussed need for prior approval before admission. Patient and family indicated understanding. CSW to fax out referral and follow up with facility choice to facilitate discharge to SNF when medically ready.  Employment status:  Retired Nurse, adult PT Recommendations:  [place] / Referral to community resources:  [place]  Patient/Family's Response to care:  Patient and family agreeable to SNF placement.  Patient/Family's Understanding of and Emotional Response to Diagnosis, Current Treatment, and Prognosis:  Patient and family seem aware of patient's limitations at this time and need for rehab at this time. Patient and family hopeful that he will be able to return home soon.  Emotional Assessment Appearance:  Appears stated age Attitude/Demeanor/Rapport:    Affect (typically observed):  Appropriate Orientation:  Oriented to Self, Oriented to Place, Oriented to  Time, Oriented to Situation Alcohol / Substance use:  Not Applicable Psych involvement (Current and /or in the community):  No (Comment)  Discharge Needs  Concerns to be addressed:  Care Coordination Readmission within the last 30 days:  No Current discharge risk:  Physical Impairment Barriers to Discharge:  Ship broker, Continued Medical Work up   [employer], [place] 02/10/2017, 9:16 AM

## 2017-02-10 NOTE — Progress Notes (Signed)
Rehab admissions - I received a call from patient's wife.  She is very upset that we received a denial for rehab admission.  She wanted MD to appeal.  I have talked to rehab MD and we will pursue the appeal process.  I will update all later today if we get a decision on the appeal.  Call me for questions.  #[phone]

## 2017-02-10 NOTE — Telephone Encounter (Signed)
I am currently seeing patients - please tell her that (since I do not do hospital care) she needs to voice her concerns to his attending physicians /nurses -especially if she feels that she may not be able to care for him herself right now  She may want to ask for a social work consult as well

## 2017-02-10 NOTE — Care Management Important Message (Signed)
Important Message  Patient Details  Name: Daniel Chapman MRN: [id number] Date of Birth: 04/29/1932   Medicare Important Message Given:  Yes    Orbie Pyo 02/10/2017, 12:39 PM

## 2017-02-10 NOTE — Progress Notes (Signed)
Patient d/c to [facility]. Pt has no new concern, pt is alert and oriented. d/c instructions done with teach back, hand off report given to R.J LPN at [facility]

## 2017-02-13 DIAGNOSIS — I639 Cerebral infarction, unspecified: Secondary | ICD-10-CM | POA: Diagnosis not present

## 2017-02-13 DIAGNOSIS — I6521 Occlusion and stenosis of right carotid artery: Secondary | ICD-10-CM | POA: Diagnosis not present

## 2017-02-13 DIAGNOSIS — I480 Paroxysmal atrial fibrillation: Secondary | ICD-10-CM | POA: Diagnosis not present

## 2017-02-13 DIAGNOSIS — J449 Chronic obstructive pulmonary disease, unspecified: Secondary | ICD-10-CM | POA: Diagnosis not present

## 2017-02-15 DIAGNOSIS — R69 Illness, unspecified: Secondary | ICD-10-CM | POA: Diagnosis not present

## 2017-02-15 DIAGNOSIS — I69321 Dysphasia following cerebral infarction: Secondary | ICD-10-CM | POA: Diagnosis not present

## 2017-02-15 DIAGNOSIS — R2681 Unsteadiness on feet: Secondary | ICD-10-CM | POA: Diagnosis not present

## 2017-02-15 DIAGNOSIS — I639 Cerebral infarction, unspecified: Secondary | ICD-10-CM | POA: Diagnosis not present

## 2017-02-17 DIAGNOSIS — R69 Illness, unspecified: Secondary | ICD-10-CM | POA: Diagnosis not present

## 2017-02-17 DIAGNOSIS — I69321 Dysphasia following cerebral infarction: Secondary | ICD-10-CM | POA: Diagnosis not present

## 2017-02-17 DIAGNOSIS — R2681 Unsteadiness on feet: Secondary | ICD-10-CM | POA: Diagnosis not present

## 2017-02-17 DIAGNOSIS — I639 Cerebral infarction, unspecified: Secondary | ICD-10-CM | POA: Diagnosis not present

## 2017-02-22 DIAGNOSIS — I480 Paroxysmal atrial fibrillation: Secondary | ICD-10-CM | POA: Diagnosis not present

## 2017-02-22 DIAGNOSIS — I6521 Occlusion and stenosis of right carotid artery: Secondary | ICD-10-CM | POA: Diagnosis not present

## 2017-02-22 DIAGNOSIS — I639 Cerebral infarction, unspecified: Secondary | ICD-10-CM | POA: Diagnosis not present

## 2017-02-22 DIAGNOSIS — J449 Chronic obstructive pulmonary disease, unspecified: Secondary | ICD-10-CM | POA: Diagnosis not present

## 2017-02-23 ENCOUNTER — Ambulatory Visit (INDEPENDENT_AMBULATORY_CARE_PROVIDER_SITE_OTHER): Payer: Medicare HMO | Admitting: Cardiology

## 2017-02-23 ENCOUNTER — Encounter: Payer: Self-pay | Admitting: Cardiology

## 2017-02-23 VITALS — BP 94/60 | HR 82 | Resp 16 | Wt 123.8 lb

## 2017-02-23 DIAGNOSIS — Z79899 Other long term (current) drug therapy: Secondary | ICD-10-CM

## 2017-02-23 DIAGNOSIS — I481 Persistent atrial fibrillation: Secondary | ICD-10-CM | POA: Diagnosis not present

## 2017-02-23 DIAGNOSIS — I4819 Other persistent atrial fibrillation: Secondary | ICD-10-CM

## 2017-02-23 NOTE — Patient Instructions (Addendum)
Medication Instructions:   Your physician recommends that you continue on your current medications as directed. Please refer to the Current Medication list given to you today.   If you need a refill on your cardiac medications before your next appointment, please call your pharmacy.  Labwork: RETURN FOR 4 WEEKS FOR CBC    Testing/Procedures: NONE ORDERED  TODAY    Follow-Up: IN 4 WEEKS WITH  AVAILABLE APP ON DR NELSON TEAM    Any Other Special Instructions Will Be Listed Below (If Applicable).

## 2017-02-23 NOTE — Progress Notes (Signed)
02/23/2017 Daniel Chapman   Nov 25, 1931  [id number]  Primary Physician Tower, [name], MD Primary Cardiologist: Dr. Meda Coffee   Reason for Visit/CC: [facility] f/u s/p acute CVA in the setting of afib   HPI:  Daniel Chapman is a 83 y.o. male who is being seen today for post hospital f/u. He has a h/o throat cancer, carotid artery disease, COPD, ITP s/p remote splenectomy, tobacco abuse and was recently admitted with acute stroke and found to have newly recognized atrial fibrillation. He was seen by Dr. Meda Coffee.  His ventricular rates were well controlled w/o any rate control agents. Echo showed normal LEVF and normal LA size. Neurology recommended Plavix initially x 1 week then switch to Eliquis, as he was felt high risk for hemorraghic conversion.  Eliquis dosed at 2.5 mg BID given age >38 and weight < 60 kg. He was also placed on statin therapy with Crestor. He was sent to SNF for rehab but discharged home yesterday. It was recommended to consider DCCV after 4 weeks of anticoagulation if still in afib at time of office f/u.   He presents to clinic today for f/u. He reports his Eliquis was started yesterday. EKG shows persistent afib with CVR, not on any rate control agents. HR is 82. He is asymptomatic with his arrhthymias. He is getting around well w/o any issues. He is ambulating with a walker. BP is soft but stable. No dizziness, fatigue, syncope/ near syncope. Pt is very jovial today. Obviously feeling very well.     Current Meds  Medication Sig  . calcium carbonate (TUMS EX) 750 MG chewable tablet Chew 1 tablet by mouth daily. Reported on 09/15/2015  . cholecalciferol (VITAMIN D) 1000 UNITS tablet Take 1,000 Units by mouth daily. Reported on 09/15/2015  . ELIQUIS 2.5 MG TABS tablet   . rosuvastatin (CRESTOR) 10 MG tablet Take 1 tablet (10 mg total) by mouth daily at 6 PM.  . senna-docusate (SENOKOT-S) 8.6-50 MG tablet Take 1 tablet by mouth at bedtime as needed for moderate constipation.    . sertraline (ZOLOFT) 100 MG tablet TAKE 1/2 TABLET EVERY DAY  FOR  DEPRESSION   No Known Allergies Past Medical History:  Diagnosis Date  . 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 Carotid artery occlusion   . COPD (chronic obstructive pulmonary disease) ([place])   . Fall due to ice or snow Feb. 22, 2015   Partial Right Hip replaced  . History of ITP   . Hx of colonic polyps   . Hyperlipemia   . Osteopenia    ?  Marland Kitchen Osteoporosis    dexa 06, declines further dexa or tx   . Smoker    Family History  Problem Relation Age of Onset  . Uterine cancer Mother   . Heart disease Sister        some heart trouble   Past Surgical History:  Procedure Laterality Date  . BONE MARROW BIOPSY  05/2003  . CATARACT EXTRACTION    . Dexa     per patient ? osteopenia  . HEMORRHOID SURGERY    . JOINT REPLACEMENT Right Feb. 22, 2015   Partial Hip  . SPLENECTOMY    . TONSILLECTOMY AND ADENOIDECTOMY Bilateral age 60 per patient   Social History   Social History  . Marital status: Married    Spouse name: N/A  . Number of children: 6  . Years of education: N/A   Occupational History  .  Ice business Self-Employed   Social History Main Topics  . Smoking status: Current Every Day Smoker    Packs/day: 0.50    Types: Cigarettes  . Smokeless tobacco: Never Used  . Alcohol use No  . Drug use: No  . Sexual activity: No   Other Topics Concern  . Not on file   Social History Narrative  . No narrative on file     Review of Systems: General: negative for chills, fever, night sweats or weight changes.  Cardiovascular: negative for chest pain, dyspnea on exertion, edema, orthopnea, palpitations, paroxysmal nocturnal dyspnea or shortness of breath Dermatological: negative for rash Respiratory: negative for cough or wheezing Urologic: negative for hematuria Abdominal: negative for nausea, vomiting, diarrhea, bright red blood per rectum, melena, or hematemesis Neurologic:  negative for visual changes, syncope, or dizziness All other systems reviewed and are otherwise negative except as noted above.   Physical Exam:  Blood pressure 94/60, pulse 82, resp. rate 16, weight 123 lb 12.8 oz (56.2 kg), SpO2 96 %.  General appearance: alert, cooperative and no distress Neck: no carotid bruit and no JVD Lungs: clear to auscultation bilaterally Heart: irregularly irregular rhythm and regular rate Extremities: extremities normal, atraumatic, no cyanosis or edema Pulses: 2+ and symmetric Skin: Skin color, texture, turgor normal. No rashes or lesions Neurologic: Grossly normal  EKG afib with a CVR -- personally reviewed   ASSESSMENT AND PLAN:   1. CVA: in the setting of atrial fibrillation. Pt was initially placed on Plavix x 1 week and recently transitioned to Eliquis by neurology yesterday. Pt is on 2.5 mg BID (age > 80 and wt < 60 kg). He is also on statin therapy with Crestor. He has completed rehab. Doing well. No significant deficits. Ambulating with walker.  2. Atrial Fibrillation: persistent afib. Rate is controlled. He is asymptomatic. No room in BP to add any rate control agents. Continue Eliquis. F/u in 4 weeks for repeat EKG. Plan DCCV if still in afib. Check f/u CBC in 4 weeks for medication monitoring.   3. HLD: recent lipid panel showed LDL at 96 mg/dL. Pt with CV disease s/p recent stroke. LDL goal for secondary prevention is < 70 mg/dL. He was recently placed on statin therapy with Crestor. He will need f/u FLP and HFTs in 8 weeks.   PLAN  Return in 4 weeks for repeat EKG. If still in afib, we will plan on DCCV.     [name], M[facility] 02/23/2017 12:40 PM

## 2017-02-24 ENCOUNTER — Encounter: Payer: Self-pay | Admitting: Family Medicine

## 2017-02-24 ENCOUNTER — Ambulatory Visit (INDEPENDENT_AMBULATORY_CARE_PROVIDER_SITE_OTHER): Payer: Medicare HMO | Admitting: Family Medicine

## 2017-02-24 VITALS — BP 98/62 | HR 86 | Temp 97.5°F | Ht 66.75 in | Wt 124.0 lb

## 2017-02-24 DIAGNOSIS — Z8673 Personal history of transient ischemic attack (TIA), and cerebral infarction without residual deficits: Secondary | ICD-10-CM | POA: Diagnosis not present

## 2017-02-24 DIAGNOSIS — E782 Mixed hyperlipidemia: Secondary | ICD-10-CM

## 2017-02-24 DIAGNOSIS — F17200 Nicotine dependence, unspecified, uncomplicated: Secondary | ICD-10-CM

## 2017-02-24 DIAGNOSIS — I6521 Occlusion and stenosis of right carotid artery: Secondary | ICD-10-CM | POA: Diagnosis not present

## 2017-02-24 DIAGNOSIS — R69 Illness, unspecified: Secondary | ICD-10-CM | POA: Diagnosis not present

## 2017-02-24 DIAGNOSIS — L723 Sebaceous cyst: Secondary | ICD-10-CM | POA: Diagnosis not present

## 2017-02-24 DIAGNOSIS — I4891 Unspecified atrial fibrillation: Secondary | ICD-10-CM

## 2017-02-24 DIAGNOSIS — Z23 Encounter for immunization: Secondary | ICD-10-CM | POA: Diagnosis not present

## 2017-02-24 DIAGNOSIS — I6309 Cerebral infarction due to thrombosis of other precerebral artery: Secondary | ICD-10-CM

## 2017-02-24 LAB — CBC WITH DIFFERENTIAL/PLATELET
BASOS PCT: 0.8 % (ref 0.0–3.0)
Basophils Absolute: 0.1 10*3/uL (ref 0.0–0.1)
EOS ABS: 0.2 10*3/uL (ref 0.0–0.7)
Eosinophils Relative: 2.2 % (ref 0.0–5.0)
HCT: 39.4 % (ref 39.0–52.0)
HEMOGLOBIN: 12.8 g/dL — AB (ref 13.0–17.0)
Lymphocytes Relative: 29.3 % (ref 12.0–46.0)
Lymphs Abs: 2.7 10*3/uL (ref 0.7–4.0)
MCHC: 32.4 g/dL (ref 30.0–36.0)
MCV: 94.3 fl (ref 78.0–100.0)
MONO ABS: 1 10*3/uL (ref 0.1–1.0)
Monocytes Relative: 11.2 % (ref 3.0–12.0)
Neutro Abs: 5.2 10*3/uL (ref 1.4–7.7)
Neutrophils Relative %: 56.5 % (ref 43.0–77.0)
PLATELETS: 242 10*3/uL (ref 150.0–400.0)
RBC: 4.18 Mil/uL — ABNORMAL LOW (ref 4.22–5.81)
RDW: 15.8 % — AB (ref 11.5–15.5)
WBC: 9.3 10*3/uL (ref 4.0–10.5)

## 2017-02-24 LAB — BASIC METABOLIC PANEL
BUN: 19 mg/dL (ref 6–23)
CO2: 28 mEq/L (ref 19–32)
Calcium: 9.6 mg/dL (ref 8.4–10.5)
Chloride: 103 mEq/L (ref 96–112)
Creatinine, Ser: 0.77 mg/dL (ref 0.40–1.50)
GFR: 102.44 mL/min (ref >60.00)
Glucose, Bld: 92 mg/dL (ref 70–99)
POTASSIUM: 4.3 meq/L (ref 3.5–5.1)
SODIUM: 136 meq/L (ref 135–145)

## 2017-02-24 MED ORDER — CEPHALEXIN 500 MG PO CAPS: 500.0000 mg | ORAL_CAPSULE | Freq: Three times a day (TID) | ORAL | 0 refills | Status: DC

## 2017-02-24 NOTE — Assessment & Plan Note (Signed)
Cardiology f/u  Recent cva crestor  eliquis  Follow

## 2017-02-24 NOTE — Assessment & Plan Note (Signed)
Pt was to have this removed (R upper back)- but in light of new cva and new start anti coag this was cancelled Looks infected today  tx with keflex 500 tid for 7d Warm compresses Let drain if it does on its own Continue to follow Update if not starting to improve in a week or if worsening

## 2017-02-24 NOTE — Assessment & Plan Note (Signed)
Now on eliquis Continue cardiology f/u  Enc to quit smoking Continue crestor

## 2017-02-24 NOTE — Patient Instructions (Addendum)
Take the keflex three times daily for a week  Use a warm compress on the cyst (if it drains that is ok)   Flu shot today   Take care of yourself   Follow up with cardiology and a neurology as planned   If your symptoms return or worsen please let us know   I'm glad you are feeling better   Labs today

## 2017-02-24 NOTE — Assessment & Plan Note (Signed)
Reviewed hospital records, lab results and studies in detail   Rate controlled on own  Had cva as well  Doing better  Watching bp  On crestor  F/u cardiology 1 mo - considering cardioversion

## 2017-02-24 NOTE — Progress Notes (Signed)
Subjective:    Patient ID: Daniel Chapman, male    DOB: Nov 06, 1931, 83 y.o.   MRN: [id number]  HPI Here for hospital f/u  He was hospitalized from 9/10 to 7/62/83 for acute embolic CVA fron new atrial fibrillation  (in the setting of vasc dz and smoking with copd)  He was placed on plavix and then transitioned to eliquis  Recommended cbc and bmet at f/u  Speech therapy ref  CVA was seen in R post inf cerebellar territory w/o hemorrhage vut som minimal edema with mass effect  R ICA occlusion also noted with MR  CT angiogram- complete occlusion of R ICA adnexal resion , severe stenosis of L vert artery lesion   COPD was stable in the hospital and CT of the chest revealed extensive scarring with tiny pulmonary nodules   A fib-rate controlled  Placed on eliquis after plavix Not asa candidate 2nd to hx of ITP in the past   Ct Angio Head W Or Wo Contrast  Result Date: 02/06/2017 CLINICAL DATA:  Acute right cerebellar infarction. EXAM: CT ANGIOGRAPHY HEAD AND NECK TECHNIQUE: Multidetector CT imaging of the head and neck was performed using the standard protocol during bolus administration of intravenous contrast. Multiplanar CT image reconstructions and MIPs were obtained to evaluate the vascular anatomy. Carotid stenosis measurements (when applicable) are obtained utilizing NASCET criteria, using the distal internal carotid diameter as the denominator. CONTRAST:  50 cc Isovue 370 COMPARISON:  Head CT same day FINDINGS: CTA NECK FINDINGS Aortic arch: Aortic atherosclerosis. No aneurysm or dissection. Branching pattern of the brachiocephalic vessels from the arch is normal. Atherosclerotic disease at the brachiocephalic vessel origins but without stenosis greater than about 20%. Right carotid system: Common carotid artery shows atherosclerotic disease with narrowing in the mid portion of 40%. Occlusion of the right ICA at the origin. No antegrade flow in the ICA at the skullbase. Left carotid  system: Common carotid artery widely patent to the bifurcation region. Motion degradation at the bifurcation. Streak artifact at the bifurcation. Atherosclerotic disease with soft and calcified plaque. Stenosis of the proximal ICA estimated at 50%. Motion degradation in the cervical ICA region without suspicion of flow limiting stenosis in that region. Vertebral arteries: Right vertebral artery origin widely patent. No subclavian stenosis proximal or distal to that. Right vertebral artery is widely patent through the cervical region to the foramen magnum. Left subclavian artery shows a focal way lobe or localized dissection just proximal to the left vertebral artery origin. There is severe stenosis at the left vertebral artery origin, 80% or greater. The vessel is patent beyond [facility] through the cervical region to the foramen magnum. Skeleton: Ordinary mid cervical spondylosis. Other neck: No mass or lymphadenopathy. Benign appearing sebaceous cyst of the right lower neck. Upper chest: Extensive upper lobe emphysema with pleural and parenchymal scarring. Regions of scarring appear masslike and are progressive since the most recent chest CT of 05/05/2010. Depending on the clinical status, complete chest CT would be suggested if the patient is a candidate for treatment of pulmonary disease. Review of the MIP images confirms the above findings CTA HEAD FINDINGS Anterior circulation: Left internal carotid artery is patent through the skullbase and siphon regions. There is some external to internal collateral flow with reconstitution of the distal siphon and supraclinoid ICA on the right. There are also patent anterior and posterior communicating arteries. No ACA or MCA stenosis. No aneurysm or vascular malformation. Posterior circulation: Both vertebral arteries are patent at the foramen  magnum level. Flow is present in both posterior inferior cerebellar artery is presently. Both vertebral artery supply the basilar. No  basilar stenosis. Left anterior inferior cerebellar artery, superior cerebellar arteries and posterior cerebral arteries are patent bilaterally without evidence of flow limiting stenosis or missing components. It is not possible to determine if the right anterior inferior cerebellar artery is occluded or absent developmentally. Venous sinuses: Patent and normal Anatomic variants: None other significant Delayed phase: No abnormal enhancement Review of the MIP images confirms the above findings IMPRESSION: Advanced atherosclerotic disease of the thoracic aorta but without evidence of aneurysm or dissection. Atherosclerotic disease at the brachiocephalic vessel origins but without stenosis greater than 20%. 40% stenosis in the midportion of the right common carotid artery. Complete occlusion of the right internal carotid artery at its origin. Atherosclerotic disease at the left carotid bifurcation. Detail is limited by artifact and motion. Stenosis is estimated at 50% at the proximal ICA. Right vertebral artery widely patent through the neck. Web or focal dissection of the left subclavian artery just proximal to the left vertebral artery origin. Severe stenosis of the left vertebral artery origin, 80% or greater. The vessel is widely patent beyond that. Both posterior inferior cerebellar arteries are patent presently. No visible anterior inferior cerebellar artery on the right. This could be occluded or congenitally absent. Right ICA reconstitution in the supraclinoid region secondary to external to internal collaterals, patent anterior communicating artery and patent posterior communicating artery. Parenchymal distributions of the anterior and middle cerebral vessels appear intact. Advanced chronic lung disease with advanced emphysema. Pleural and parenchymal scarring at both apices, with increased density compared to the CT chest of 2011. I cannot rule out the possibility of tumor in this region, though this may  merely be progressive scarring. If the patient is a candidate for treatment pulmonary disease, complete chest CT would be warranted. Electronically Signed   By: Nelson Chimes M.D.   On: 02/06/2017 14:33   Dg Chest 2 View  Result Date: 02/06/2017 CLINICAL DATA:  Stroke.  No chest complaints. EXAM: CHEST  2 VIEW COMPARISON:  07/19/2011 FINDINGS: Hyperinflation. Lateral view degraded by patient arm position. Midthoracic compression deformity is mild to moderate and similar. Numerous leads and wires project over the chest. Midline trachea. Normal heart size and mediastinal contours for age. Left costophrenic angle excluded from the frontal radiograph. No pleural effusion or pneumothorax. Biapical pleural-parenchymal scarring. Diffuse peribronchial thickening. IMPRESSION: 1.  No acute cardiopulmonary disease. 2. COPD/chronic bronchitis. Pleural-parenchymal scarring at the apices. Electronically Signed   By: Abigail Miyamoto M.D.   On: 02/06/2017 12:42   Ct Head Wo Contrast  Result Date: 02/06/2017 CLINICAL DATA:  83 year old male with a history of dizziness EXAM: CT HEAD WITHOUT CONTRAST TECHNIQUE: Contiguous axial images were obtained from the base of the skull through the vertex without intravenous contrast. COMPARISON:  None. FINDINGS: Brain: Confluent hypodense territory involving the inferior and medial right cerebellar hemisphere. No acute intracranial hemorrhage. Minimal mass effect resulting in slight narrowing of the fourth ventricle. Unremarkable configuration of the ventricles. No supratentorial hemorrhage, midline shift, mass effect. Gray-white differentiation of the anterior circulation maintained. Vascular: Calcifications of intracranial vasculature. Sagittal reformatted images demonstrate questionable hyperdensity of the vertebrobasilar junction/inferior basilar artery. Skull: No acute bony abnormality.  No aggressive bone lesion. Sinuses/Orbits: No significant atherosclerotic changes. Other: None  IMPRESSION: Subacute and completed infarction of the right posterior inferior cerebellar territory. No intracranial hemorrhage, with minimal local mass effect secondary to edema. Sagittal reformatted images demonstrate  questionable hyperdensity of the vertebrobasilar junction and the inferior basilar artery, potentially representing thrombus. CT angiogram may be useful. Electronically Signed   By: Corrie Mckusick D.O.   On: 02/06/2017 11:07   Ct Angio Neck W Or Wo Contrast  Result Date: 02/06/2017 CLINICAL DATA:  Acute right cerebellar infarction. EXAM: CT ANGIOGRAPHY HEAD AND NECK TECHNIQUE: Multidetector CT imaging of the head and neck was performed using the standard protocol during bolus administration of intravenous contrast. Multiplanar CT image reconstructions and MIPs were obtained to evaluate the vascular anatomy. Carotid stenosis measurements (when applicable) are obtained utilizing NASCET criteria, using the distal internal carotid diameter as the denominator. CONTRAST:  50 cc Isovue 370 COMPARISON:  Head CT same day FINDINGS: CTA NECK FINDINGS Aortic arch: Aortic atherosclerosis. No aneurysm or dissection. Branching pattern of the brachiocephalic vessels from the arch is normal. Atherosclerotic disease at the brachiocephalic vessel origins but without stenosis greater than about 20%. Right carotid system: Common carotid artery shows atherosclerotic disease with narrowing in the mid portion of 40%. Occlusion of the right ICA at the origin. No antegrade flow in the ICA at the skullbase. Left carotid system: Common carotid artery widely patent to the bifurcation region. Motion degradation at the bifurcation. Streak artifact at the bifurcation. Atherosclerotic disease with soft and calcified plaque. Stenosis of the proximal ICA estimated at 50%. Motion degradation in the cervical ICA region without suspicion of flow limiting stenosis in that region. Vertebral arteries: Right vertebral artery origin widely  patent. No subclavian stenosis proximal or distal to that. Right vertebral artery is widely patent through the cervical region to the foramen magnum. Left subclavian artery shows a focal way lobe or localized dissection just proximal to the left vertebral artery origin. There is severe stenosis at the left vertebral artery origin, 80% or greater. The vessel is patent beyond [facility] through the cervical region to the foramen magnum. Skeleton: Ordinary mid cervical spondylosis. Other neck: No mass or lymphadenopathy. Benign appearing sebaceous cyst of the right lower neck. Upper chest: Extensive upper lobe emphysema with pleural and parenchymal scarring. Regions of scarring appear masslike and are progressive since the most recent chest CT of 05/05/2010. Depending on the clinical status, complete chest CT would be suggested if the patient is a candidate for treatment of pulmonary disease. Review of the MIP images confirms the above findings CTA HEAD FINDINGS Anterior circulation: Left internal carotid artery is patent through the skullbase and siphon regions. There is some external to internal collateral flow with reconstitution of the distal siphon and supraclinoid ICA on the right. There are also patent anterior and posterior communicating arteries. No ACA or MCA stenosis. No aneurysm or vascular malformation. Posterior circulation: Both vertebral arteries are patent at the foramen magnum level. Flow is present in both posterior inferior cerebellar artery is presently. Both vertebral artery supply the basilar. No basilar stenosis. Left anterior inferior cerebellar artery, superior cerebellar arteries and posterior cerebral arteries are patent bilaterally without evidence of flow limiting stenosis or missing components. It is not possible to determine if the right anterior inferior cerebellar artery is occluded or absent developmentally. Venous sinuses: Patent and normal Anatomic variants: None other significant Delayed  phase: No abnormal enhancement Review of the MIP images confirms the above findings IMPRESSION: Advanced atherosclerotic disease of the thoracic aorta but without evidence of aneurysm or dissection. Atherosclerotic disease at the brachiocephalic vessel origins but without stenosis greater than 20%. 40% stenosis in the midportion of the right common carotid artery. Complete occlusion of  the right internal carotid artery at its origin. Atherosclerotic disease at the left carotid bifurcation. Detail is limited by artifact and motion. Stenosis is estimated at 50% at the proximal ICA. Right vertebral artery widely patent through the neck. Web or focal dissection of the left subclavian artery just proximal to the left vertebral artery origin. Severe stenosis of the left vertebral artery origin, 80% or greater. The vessel is widely patent beyond that. Both posterior inferior cerebellar arteries are patent presently. No visible anterior inferior cerebellar artery on the right. This could be occluded or congenitally absent. Right ICA reconstitution in the supraclinoid region secondary to external to internal collaterals, patent anterior communicating artery and patent posterior communicating artery. Parenchymal distributions of the anterior and middle cerebral vessels appear intact. Advanced chronic lung disease with advanced emphysema. Pleural and parenchymal scarring at both apices, with increased density compared to the CT chest of 2011. I cannot rule out the possibility of tumor in this region, though this may merely be progressive scarring. If the patient is a candidate for treatment pulmonary disease, complete chest CT would be warranted. Electronically Signed   By: Nelson Chimes M.D.   On: 02/06/2017 14:33   Ct Chest W Contrast  Result Date: 02/09/2017 CLINICAL DATA:  83 year old male with history of shortness of breath. Abnormalities noted in the lung apices noted on recent head neck CT. Followup study. EXAM: CT  CHEST WITH CONTRAST TECHNIQUE: Multidetector CT imaging of the chest was performed during intravenous contrast administration. CONTRAST:  < 75 mL > ISOVUE-300 IOPAMIDOL (ISOVUE-300) INJECTION 61% COMPARISON:  Chest CT 05/05/2010.  Neck CT 02/06/2017. FINDINGS: Cardiovascular: Heart size is normal. There is no significant pericardial fluid, thickening or pericardial calcification. Aortic atherosclerosis. No atherosclerotic calcifications in the coronary arteries. Thickening and calcification of the aortic valve. Mediastinum/Nodes: No pathologically enlarged mediastinal or hilar lymph nodes. Esophagus is unremarkable in appearance. No axillary lymphadenopathy. Lungs/Pleura: Extensive areas of chronic nodular and mass-like pleuroparenchymal architectural distortion, partially calcified, similar to prior studies, most compatible with areas of chronic post infectious or inflammatory scarring. Diffuse bronchial wall thickening with moderate to severe centrilobular and paraseptal emphysema. Multiple tiny pulmonary nodules are again noted scattered throughout the lungs bilaterally, generally similar to prior study from 05/05/2010, including the largest nodule in the right lower lobe which measures 10 x 6 mm (mean diameter of 8 mm) on image 118 of series 7. A few new pulmonary nodules are noted, highly nonspecific, measuring up to 5 mm in the left upper lobe (axial image 100 of series 7). No acute consolidative airspace disease. No pleural effusions. Upper Abdomen: Status post splenectomy.  Aortic atherosclerosis. Musculoskeletal: Chronic appearing compression fracture of T7 with approximately 40% loss of anterior vertebral body height. There are no aggressive appearing lytic or blastic lesions noted in the visualized portions of the skeleton. IMPRESSION: 1. Extensive scarring in the lung apices, similar to prior studies. 2. There are multiple tiny pulmonary nodules scattered throughout the lungs bilaterally, generally  similar to prior study from 2011, considered benign. There are few exceptions which are new, measuring 5 mm or less in size. These are nonspecific, but likely benign. No follow-up needed if patient is low-risk (and has no known or suspected primary neoplasm). Non-contrast chest CT can be considered in 12 months if patient is high-risk. This recommendation follows the consensus statement: Guidelines for Management of Incidental Pulmonary Nodules Detected on CT Images: From the Fleischner Society 2017; Radiology 2017; 284:228-24. 3. Aortic atherosclerosis. 4. Diffuse bronchial wall thickening  with moderate to severe centrilobular and paraseptal emphysema; imaging findings suggestive of underlying COPD. Aortic Atherosclerosis (ICD10-I70.0) and Emphysema (ICD10-J43.9). Electronically Signed   By: Vinnie Langton M.D.   On: 02/09/2017 09:58   Mr Brain Wo Contrast  Result Date: 02/06/2017 CLINICAL DATA:  83 y/o  M; follow-up of stroke. EXAM: MRI HEAD WITHOUT CONTRAST MRA HEAD WITHOUT CONTRAST TECHNIQUE: Multiplanar, multiecho pulse sequences of the brain and surrounding structures were obtained without intravenous contrast. Angiographic images of the head were obtained using MRA technique without contrast. COMPARISON:  02/06/2017 CT of the head and CTA of the head. FINDINGS: MRI HEAD FINDINGS Brain: Reduced diffusion in the right inferior cerebellum. Associated T2 hyperintense signal abnormality and mild local mass effect compatible with late acute/ early subacute infarction. Unsure diffusion signal abnormality in the brain. No abnormal susceptibility hypointensity to indicate intracranial hemorrhage. No hydrocephalus or effacement of basilar cisterns. Background of few scattered nonspecific foci of T2 FLAIR hyperintense signal abnormality in subcortical and periventricular white matter is compatible with mild chronic microvascular ischemic changes for age. Mild brain parenchymal volume loss. Vascular: As below.  Skull and upper cervical spine: Normal marrow signal. Sinuses/Orbits: Mild right maxillary sinus mucosal thickening. Bilateral intra-ocular lens replacement. No abnormal signal of mastoid air cells Other: None. MRA HEAD FINDINGS Internal carotid arteries: Patent left internal carotid artery. Mild irregularity of the cavernous segment without stenosis compatible with intracranial atherosclerosis. The right internal carotid artery is occluded to the paraclinoid segment where it is reconstituted to the terminus. Anterior cerebral arteries:  Patent. Middle cerebral arteries: Patent. Anterior communicating artery: Patent. Posterior communicating arteries: Large right and possible diminutive left posterior communicating arteries. Posterior cerebral arteries:  Patent. Basilar artery:  Patent. Vertebral arteries:  Patent. No evidence of high-grade stenosis, large vessel occlusion, or aneurysm unless noted above. IMPRESSION: 1. Right inferior cerebellum late acute/early subacute infarction stable in distribution in comparison with prior CT of the head given differences in technique. No acute hemorrhage. 2. Stable background of mild chronic microvascular ischemic changes and mild parenchymal volume loss of the brain. 3. Right ICA occlusion with reconstitution at the paraclinoid segment to the terminus. Large right posterior communicating artery. 4. Patent circle of Willis. No large vessel occlusion, aneurysm, or significant stenosis is identified. Electronically Signed   By: Kristine Garbe M.D.   On: 02/06/2017 19:40   Dg Swallowing Func-speech Pathology  Result Date: 02/07/2017 Objective Swallowing Evaluation: Type of Study: MBS-Modified Barium Swallow Study Patient Details Name: Daniel Chapman MRN: [id number] Date of Birth: Dec 25, 1931 Today's Date: 02/07/2017 Time: SLP Start Time (ACUTE ONLY): 0819-SLP Stop Time (ACUTE ONLY): 0845 SLP Time Calculation (min) (ACUTE ONLY): 26 min Past Medical History: Past Medical  History: Diagnosis Date . 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 Carotid artery occlusion  . COPD (chronic obstructive pulmonary disease) ([place])  . Fall due to ice or snow Feb. 22, 2015  Partial Right Hip replaced . History of ITP  . Hx of colonic polyps  . Hyperlipemia  . Osteopenia   ? [facility] Kitchen Osteoporosis   dexa 06, declines further dexa or tx  . Smoker  Past Surgical History: Past Surgical History: Procedure Laterality Date . BONE MARROW BIOPSY  05/2003 . CATARACT EXTRACTION   . Dexa    per patient ? osteopenia . HEMORRHOID SURGERY   . JOINT REPLACEMENT Right Feb. 22, 2015  Partial Hip . SPLENECTOMY   . TONSILLECTOMY AND ADENOIDECTOMY Bilateral age 86 per patient HPI: Mr. Copenhaver is an  83 y.o.male with h/o HLD, vocal cord cancer, tobacco use and COPDwho presented to the ED today for evaluation of new onset dizziness and incoordination. Symptoms also included difficulty speaking, headache, nausea and vomiting. He was found by his wife on the floor beside vomit and a coffee pot he knocked down. Initially pt was unable to speak, but eventually he was able to state that he was hit with "a bout of dizziness. He has vomited three times since Sunday.   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  Swallow evaluation ordered, given h/o vocal fold cancer s/p XRT, SLP to proceed with MBS.  Subjective: pt awake in chair Assessment / Plan / Recommendation CHL IP CLINICAL IMPRESSIONS 02/07/2017 Clinical Impression Baseline coughing and expectorating secretions noted.  Pt presents with moderate pharyngeal dysphagia - suspect exacerbation of baseline deficits *known h/o dysphagia from vocal cord cancer s/p XRT.  Decreased pharyngeal contraction/tongue base retraction and epiglottic deflection results in residuals (vallecular worse than pyriform sinus) without pt consistent awareness.   Cued dry swallow helpful to decrease residuals that pt does NOT sense and following solid with liquid helpful.  Pt suspected to aspirate with  consumption of thin via straw *although missed actual swallow on flouro*.  Pt overtly coughed immediately postswallow of thin via straw - with at least laryngeal penetration observed.  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  Cough with backflow of barium into pharynx and vomiting observed - suspect due to cerebellar cva.  NO aspiration of emesis noted.  Recommend start clear liquid diet with strict precautions.  Using teach back, pt was educated to findings/recommendations thoroughly.   SLP Visit Diagnosis Dysphagia, oropharyngeal phase (R13.12) Attention and concentration deficit following -- Frontal lobe and executive function deficit following -- Impact on safety and function Moderate aspiration risk   CHL IP TREATMENT RECOMMENDATION 02/07/2017 Treatment Recommendations F/U MBS in --- days (Comment)   Prognosis 02/07/2017 Prognosis for Safe Diet Advancement Fair Barriers to Reach Goals Severity of deficits Barriers/Prognosis Comment -- CHL IP DIET RECOMMENDATION 02/07/2017 SLP Diet Recommendations Thin liquid Liquid Administration via Cup;No straw Medication Administration Other (Comment) Compensations Small sips/bites;Slow rate;Follow solids with liquid;Multiple dry swallows after each bite/sip;Other (Comment) Postural Changes Seated upright at 90 degrees;Remain semi-upright after after feeds/meals (Comment)   CHL IP OTHER RECOMMENDATIONS 02/07/2017 Recommended Consults -- Oral Care Recommendations Oral care before and after PO Other Recommendations --   CHL IP FOLLOW UP RECOMMENDATIONS 02/07/2017 Follow up Recommendations None   CHL IP FREQUENCY AND DURATION 02/07/2017 Speech Therapy Frequency (ACUTE ONLY) min 2x/week Treatment Duration 2 weeks      CHL IP ORAL PHASE 02/07/2017 Oral Phase Impaired Oral - Pudding Teaspoon -- Oral - Pudding Cup -- Oral - Honey Teaspoon -- Oral - Honey Cup -- Oral - Nectar Teaspoon -- Oral - Nectar Cup -- Oral - Nectar Straw -- Oral -  Thin Teaspoon WFL Oral - Thin Cup WFL Oral - Thin Straw Premature spillage Oral - Puree WFL Oral - Mech Soft WFL Oral - Regular -- Oral - Multi-Consistency -- Oral - Pill -- Oral Phase - Comment --  CHL IP PHARYNGEAL PHASE 02/07/2017 Pharyngeal Phase Impaired Pharyngeal- Pudding Teaspoon -- Pharyngeal -- Pharyngeal- Pudding Cup -- Pharyngeal -- Pharyngeal- Honey Teaspoon -- Pharyngeal -- Pharyngeal- Honey Cup -- Pharyngeal -- Pharyngeal- Nectar Teaspoon Reduced pharyngeal peristalsis;Reduced epiglottic inversion;Reduced anterior laryngeal mobility;Reduced laryngeal elevation;Reduced airway/laryngeal closure;Reduced tongue base retraction;Pharyngeal residue - valleculae;Pharyngeal residue - pyriform Pharyngeal -- Pharyngeal- Nectar Cup  Reduced anterior laryngeal mobility;Reduced laryngeal elevation;Reduced epiglottic inversion;Reduced pharyngeal peristalsis;Reduced tongue base retraction;Pharyngeal residue - valleculae;Pharyngeal residue - pyriform Pharyngeal -- Pharyngeal- Nectar Straw -- Pharyngeal -- Pharyngeal- Thin Teaspoon Reduced pharyngeal peristalsis;Reduced epiglottic inversion;Reduced anterior laryngeal mobility;Reduced laryngeal elevation;Reduced airway/laryngeal closure;Reduced tongue base retraction;Pharyngeal residue - valleculae;Pharyngeal residue - pyriform Pharyngeal -- Pharyngeal- Thin Cup Reduced pharyngeal peristalsis;Reduced epiglottic inversion;Reduced anterior laryngeal mobility;Reduced laryngeal elevation;Reduced airway/laryngeal closure;Reduced tongue base retraction;Pharyngeal residue - valleculae;Pharyngeal residue - pyriform Pharyngeal -- Pharyngeal- Thin Straw Reduced pharyngeal peristalsis;Reduced epiglottic inversion;Reduced anterior laryngeal mobility;Reduced laryngeal elevation;Reduced airway/laryngeal closure;Reduced tongue base retraction;Penetration/Aspiration during swallow Pharyngeal Material enters airway, passes BELOW cords and not ejected out despite cough attempt by patient  Pharyngeal- Puree Reduced pharyngeal peristalsis;Reduced epiglottic inversion;Reduced tongue base retraction;Pharyngeal residue - valleculae Pharyngeal -- Pharyngeal- Mechanical Soft Reduced pharyngeal peristalsis;Reduced epiglottic inversion;Reduced tongue base retraction;Pharyngeal residue - valleculae Pharyngeal -- Pharyngeal- Regular -- Pharyngeal -- Pharyngeal- Multi-consistency -- Pharyngeal -- Pharyngeal- Pill -- Pharyngeal -- Pharyngeal Comment CUED DRY SWALLOWS helpful to decrease residuals, "hock" to clear helpful but not fully effective  CHL IP CERVICAL ESOPHAGEAL PHASE 02/07/2017 Cervical Esophageal Phase Impaired Pudding Teaspoon -- Pudding Cup -- Honey Teaspoon -- Honey Cup -- Nectar Teaspoon -- Nectar Cup -- Nectar Straw -- Thin Teaspoon -- Thin Cup -- Thin Straw -- Puree -- Mechanical Soft -- Regular -- Multi-consistency -- Pill -- Cervical Esophageal Comment pt with strong coughing noted resulting in backflow of barium into pharynx and caused vomiting, upon esophageal sweep at end of MBS - pt appeared with clear esophagus No flowsheet data found. Daniel Chapman 02/07/2017, 10:07 AM  Daniel Salk, MS [facility] SLP [phone]             Mr Jodene Nam Head Wo Contrast  Result Date: 02/06/2017 CLINICAL DATA:  83 y/o  M; follow-up of stroke. EXAM: MRI HEAD WITHOUT CONTRAST MRA HEAD WITHOUT CONTRAST TECHNIQUE: Multiplanar, multiecho pulse sequences of the brain and surrounding structures were obtained without intravenous contrast. Angiographic images of the head were obtained using MRA technique without contrast. COMPARISON:  02/06/2017 CT of the head and CTA of the head. FINDINGS: MRI HEAD FINDINGS Brain: Reduced diffusion in the right inferior cerebellum. Associated T2 hyperintense signal abnormality and mild local mass effect compatible with late acute/ early subacute infarction. Unsure diffusion signal abnormality in the brain. No abnormal susceptibility hypointensity to indicate intracranial hemorrhage.  No hydrocephalus or effacement of basilar cisterns. Background of few scattered nonspecific foci of T2 FLAIR hyperintense signal abnormality in subcortical and periventricular white matter is compatible with mild chronic microvascular ischemic changes for age. Mild brain parenchymal volume loss. Vascular: As below. Skull and upper cervical spine: Normal marrow signal. Sinuses/Orbits: Mild right maxillary sinus mucosal thickening. Bilateral intra-ocular lens replacement. No abnormal signal of mastoid air cells Other: None. MRA HEAD FINDINGS Internal carotid arteries: Patent left internal carotid artery. Mild irregularity of the cavernous segment without stenosis compatible with intracranial atherosclerosis. The right internal carotid artery is occluded to the paraclinoid segment where it is reconstituted to the terminus. Anterior cerebral arteries:  Patent. Middle cerebral arteries: Patent. Anterior communicating artery: Patent. Posterior communicating arteries: Large right and possible diminutive left posterior communicating arteries. Posterior cerebral arteries:  Patent. Basilar artery:  Patent. Vertebral arteries:  Patent. No evidence of high-grade stenosis, large vessel occlusion, or aneurysm unless noted above. IMPRESSION: 1. Right inferior cerebellum late acute/early subacute infarction stable in distribution in comparison with prior CT of the head given differences in technique. No acute hemorrhage. 2. Stable background of mild chronic  microvascular ischemic changes and mild parenchymal volume loss of the brain. 3. Right ICA occlusion with reconstitution at the paraclinoid segment to the terminus. Large right posterior communicating artery. 4. Patent circle of Willis. No large vessel occlusion, aneurysm, or significant stenosis is identified. Electronically Signed   By: Kristine Garbe M.D.   On: 02/06/2017 19:40    Labs:  Lab Results  Component Value Date   CREATININE 0.76 02/08/2017   BUN 13  02/08/2017   NA 135 02/08/2017   K 3.6 02/08/2017   CL 105 02/08/2017   CO2 24 02/08/2017   Lab Results  Component Value Date   ALT 17 02/06/2017   AST 29 02/06/2017   ALKPHOS 52 02/06/2017   BILITOT 1.0 02/06/2017    Last glucose 102  Calcium 8.8  Lab Results  Component Value Date   WBC 10.0 02/08/2017   HGB 12.8 (L) 02/08/2017   HCT 38.4 (L) 02/08/2017   MCV 89.1 02/08/2017   PLT 230 02/08/2017     Lab Results  Component Value Date   HGBA1C 6.2 (H) 02/06/2017    Lab Results  Component Value Date   CHOL 148 02/06/2017   HDL 29 (L) 02/06/2017   LDLCALC 96 02/06/2017   TRIG 116 02/06/2017   CHOLHDL 5.1 02/06/2017     He presented with speech issues after dizziness and fall and emesis   Swallowing was followed by speech therapy and he was initially placed on liquid diet    Today  Wt Readings from Last 3 Encounters:  02/24/17 124 lb (56.2 kg)  02/23/17 123 lb 12.8 oz (56.2 kg)  02/06/17 124 lb 11.2 oz (56.6 kg)   BP: 98/62  Pulse Rate: 86   Saw cardiology yesterday  Noted his echo showed nl LVF and nl LA size  Placed on crestor  Still in persistent afib  Will plan DCCV if still in a fib after 4 weeks   He has a neuro appt in oct   Per pt -had a very bad experience at [facility]  She states the "stroke doctor" would not talk to them  Went to interim care facility   Getting used to a lower bp  He gets a little dizzy occasionally  Strength is good Speech is good  Has pureed foods for diet (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 Carotid occlusion, right   . Stroke ([place]) 02/06/2017  . Atrial fibrillation, new onset ([place]) 02/06/2017  . Sebaceous cyst 01/03/2017  . Encounter for Medicare annual wellness exam 12/17/2014  . Special screening for malignant neoplasms, colon 12/17/2014  . Routine general medical examination  at a health care facility 12/09/2014  . Aneurysm of abdominal vessel ([place]) 09/11/2013  . S/P total hip arthroplasty 08/02/2013  . H/O splenectomy 08/02/2013  . History of hip fracture 07/20/2013  . Occlusion and stenosis of carotid artery without mention of cerebral infarction 08/28/2012  . Carotid stenosis 08/14/2012  . Bruit of left carotid artery 08/01/2012  . Anxiety 01/27/2012  . Osteopenia 07/19/2011  . Prostate cancer screening 07/10/2011  . HOARSENESS, CHRONIC 02/05/2007  . 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 Hyperlipidemia 11/06/2006  . TOBACCO ABUSE 11/06/2006  . COPD (chronic obstructive pulmonary disease) ([place]) 11/06/2006  . COLONIC POLYPS, HX OF 11/06/2006  . PROSTATITIS, HX OF 11/06/2006  . 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 Carotid artery occlusion   . COPD (chronic obstructive pulmonary disease) ([place])   . Fall due to ice or snow Feb. 22, 2015   Partial Right Hip replaced  . History of ITP   . Hx of colonic polyps   . Hyperlipemia   . Osteopenia    ?  Marland Kitchen Osteoporosis    dexa 06, declines further dexa or tx   . Smoker    Past Surgical History:  Procedure Laterality Date  . BONE MARROW BIOPSY  05/2003  . CATARACT EXTRACTION    . Dexa     per patient ? osteopenia  . HEMORRHOID SURGERY    . JOINT REPLACEMENT Right Feb. 22, 2015   Partial Hip  . SPLENECTOMY    . TONSILLECTOMY AND ADENOIDECTOMY Bilateral age 5 per patient   Social History  Substance Use Topics  . Smoking status: Former Smoker    Packs/day: 0.50    Types: Cigarettes  . Smokeless tobacco: Never Used  . Alcohol use No   Family History  Problem Relation Age of Onset  . Uterine cancer Mother   . Heart disease Sister        some heart trouble   No Known Allergies Current Outpatient Prescriptions on File Prior to Visit  Medication Sig Dispense Refill  . calcium  carbonate (TUMS EX) 750 MG chewable tablet Chew 1 tablet by mouth daily. Reported on 09/15/2015    . cholecalciferol (VITAMIN D) 1000 UNITS tablet Take 1,000 Units by mouth daily. Reported on 09/15/2015    . ELIQUIS 2.5 MG TABS tablet     . rosuvastatin (CRESTOR) 10 MG tablet Take 1 tablet (10 mg total) by mouth daily at 6 PM.    . senna-docusate (SENOKOT-S) 8.6-50 MG tablet Take 1 tablet by mouth at bedtime as needed for moderate constipation.     No current facility-administered medications on file prior to visit.      Review of Systems  Constitutional: Negative for activity change, appetite change, fatigue, fever and unexpected weight change.  HENT: Negative for congestion, rhinorrhea, sore throat and trouble swallowing.   Eyes: Negative for pain, redness, itching and visual disturbance.  Respiratory: Negative for cough, choking, chest tightness, shortness of breath, wheezing and stridor.   Cardiovascular: Negative for chest pain and palpitations.  Gastrointestinal: Negative for abdominal pain, blood in stool, constipation, diarrhea and nausea.  Endocrine: Negative for cold intolerance, heat intolerance, polydipsia and polyuria.  Genitourinary: Negative for difficulty urinating, dysuria, frequency and urgency.  Musculoskeletal: Negative for arthralgias, joint swelling and myalgias.  Skin: Negative for pallor and rash.  Neurological: Positive for light-headedness. Negative for dizziness, tremors, seizures, syncope, facial asymmetry, speech difficulty, weakness, numbness and headaches.  Hematological: Negative for adenopathy. Does not bruise/bleed easily.  Psychiatric/Behavioral: Negative for decreased concentration and dysphoric mood. The patient is nervous/anxious.        Objective:   Physical Exam  Constitutional: He appears well-developed and well-nourished. No distress.  Slim and well appearing   HENT:  Head: Normocephalic and atraumatic.  Mouth/Throat: Oropharynx is clear and  moist.  Eyes: Pupils are equal, round, and reactive to light. Conjunctivae and EOM are normal.  Neck: Normal range of motion. Neck supple. No JVD present. Carotid bruit is present. No tracheal deviation present. No thyromegaly present.  Cardiovascular: Normal rate, normal heart sounds and intact distal pulses.  Exam  reveals no gallop.   irreg irregular rhythm   Pulmonary/Chest: Effort normal and breath sounds normal. No respiratory distress. He has no wheezes. He has no rales.  No crackles  Abdominal: Soft. Bowel sounds are normal. He exhibits no distension, no abdominal bruit and no mass. There is no tenderness.  Musculoskeletal: He exhibits no edema or tenderness.  Lymphadenopathy:    He has no cervical adenopathy.  Neurological: He is alert. He has normal reflexes. He displays no atrophy. No cranial nerve deficit or sensory deficit. He exhibits normal muscle tone. He displays a negative Romberg sign. Coordination and gait normal.  Nl neuro exam today  No cerebellar defects   Skin: Skin is warm and dry. No rash noted.  R upper back 2-3 cm infected seb cyst-is partially red and warm but not tender No drainage  Fluctuant   Psychiatric: He has a normal mood and affect.  Baseline boisterous personality           Assessment & Plan:   Problem List Items Addressed This Visit      Cardiovascular and Mediastinum   Atrial fibrillation, new onset [facility])    Reviewed hospital records, lab results and studies in detail   Rate controlled on own  Had cva as well  Doing better  Watching bp  On crestor  F/u cardiology 1 mo - considering cardioversion      Relevant Orders   CBC with Differential/Platelet   Basic metabolic panel   Carotid occlusion, right    Cardiology f/u  Recent cva crestor  eliquis  Follow       Carotid stenosis    Now on eliquis Continue cardiology f/u  Enc to quit smoking Continue crestor       Stroke [facility]) - Primary    R post inf cerebellar territory  with mild edema - thought to be caused by new a fib  Reviewed hospital records, lab results and studies in detail  R ICA occl seen on MR and L vert art lesion see on CT angio  Doing well on eliquis  No residual deficits Advancing diet  Not smoking (unsure if he will be able to keep this up)  Rate controlled  Rev cardiology note from yesterday  For card/ neuo f/u next mo  Cbc/bmp today re check post hosp  Continue crestor  Update if re occurrence of any symptoms  Watching for hypotension/dizziness       Relevant Orders   CBC with Differential/Platelet   Basic metabolic panel     Musculoskeletal and Integument   Sebaceous cyst    Pt was to have this removed (R upper back)- but in light of new cva and new start anti coag this was cancelled Looks infected today  tx with keflex 500 tid for 7d Warm compresses Let drain if it does on its own Continue to follow Update if not starting to improve in a week or if worsening          Other   Hyperlipidemia    Disc goals for lipids and reasons to control them Rev labs with pt Rev low sat fat diet in detail Goal LDL under 70 given cva For cardiology f/u 1 mo - started crestor 10 mg  Disc diet       TOBACCO ABUSE    Now with more vascular issues and CVA enc more strongly to quit Has already had throat cancer and copd  Unsure if he will comply

## 2017-02-24 NOTE — Assessment & Plan Note (Signed)
Disc goals for lipids and reasons to control them Rev labs with pt Rev low sat fat diet in detail Goal LDL under 70 given cva For cardiology f/u 1 mo - started crestor 10 mg  Disc diet

## 2017-02-24 NOTE — Assessment & Plan Note (Signed)
Now with more vascular issues and CVA enc more strongly to quit Has already had throat cancer and copd  Unsure if he will comply

## 2017-02-24 NOTE — Assessment & Plan Note (Signed)
R post inf cerebellar territory with mild edema - thought to be caused by new a fib  Reviewed hospital records, lab results and studies in detail  R ICA occl seen on MR and L vert art lesion see on CT angio  Doing well on eliquis  No residual deficits Advancing diet  Not smoking (unsure if he will be able to keep this up)  Rate controlled  Rev cardiology note from yesterday  For card/ neuo f/u next mo  Cbc/bmp today re check post hosp  Continue crestor  Update if re occurrence of any symptoms  Watching for hypotension/dizziness

## 2017-02-27 ENCOUNTER — Encounter: Payer: Self-pay | Admitting: *Deleted

## 2017-02-28 ENCOUNTER — Ambulatory Visit (INDEPENDENT_AMBULATORY_CARE_PROVIDER_SITE_OTHER): Payer: Medicare HMO | Admitting: Family Medicine

## 2017-02-28 ENCOUNTER — Encounter: Payer: Self-pay | Admitting: Family Medicine

## 2017-02-28 VITALS — BP 116/62 | HR 61 | Temp 97.2°F | Ht 66.75 in

## 2017-02-28 DIAGNOSIS — L723 Sebaceous cyst: Secondary | ICD-10-CM | POA: Diagnosis not present

## 2017-02-28 MED ORDER — SULFAMETHOXAZOLE-TRIMETHOPRIM 800-160 MG PO TABS: 1.0000 | ORAL_TABLET | Freq: Two times a day (BID) | ORAL | 0 refills | Status: DC

## 2017-02-28 NOTE — Assessment & Plan Note (Signed)
Infected cyst did not respond to keflex  I and D done through one of the openings already started and tolerated well  Dressed with abx ointment and bandage Wound care discussed  Finish keflex/ added bactrim DS to cover MRSA  Watch for inc redness or signs of cellulitis  F/u fri for re check  Has been unable to get cyst fully removed due to recent afib and new anticoagulation

## 2017-02-28 NOTE — Patient Instructions (Signed)
Continue and finish the keflex (first antibiotic)  Take the bactrim ds as directed  Keep the cyst clean with soap and water  If it drains- that is ok/ just keep it clean  If worse or more pain or fever = let us know  Follow up Friday for a re check

## 2017-02-28 NOTE — Progress Notes (Signed)
Subjective:    Patient ID: Daniel Chapman, male    DOB: 1931-09-07, 83 y.o.   MRN: [id number]  HPI Here for re check if infected sebaceous cyst   Px keflex at last visit  Not doing helping   It is getting bigger and more sore  Some holes in it-not draining  No fever or malaise   Patient Active Problem List   Diagnosis Date Noted  . Carotid occlusion, right   . Stroke ([place]) 02/06/2017  . Atrial fibrillation, new onset ([place]) 02/06/2017  . Sebaceous cyst 01/03/2017  . Encounter for Medicare annual wellness exam 12/17/2014  . Special screening for malignant neoplasms, colon 12/17/2014  . Routine general medical examination at a health care facility 12/09/2014  . Aneurysm of abdominal vessel ([place]) 09/11/2013  . S/P total hip arthroplasty 08/02/2013  . H/O splenectomy 08/02/2013  . History of hip fracture 07/20/2013  . Occlusion and stenosis of carotid artery without mention of cerebral infarction 08/28/2012  . Carotid stenosis 08/14/2012  . Bruit of left carotid artery 08/01/2012  . Anxiety 01/27/2012  . Osteopenia 07/19/2011  . Prostate cancer screening 07/10/2011  . HOARSENESS, CHRONIC 02/05/2007  . 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 Hyperlipidemia 11/06/2006  . TOBACCO ABUSE 11/06/2006  . COPD (chronic obstructive pulmonary disease) ([place]) 11/06/2006  . COLONIC POLYPS, HX OF 11/06/2006  . PROSTATITIS, HX OF 11/06/2006  . 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 Carotid artery occlusion   . COPD (chronic obstructive pulmonary disease) ([place])   . Fall due to ice or snow Feb. 22, 2015   Partial Right Hip replaced  . History of ITP   . Hx of colonic polyps   . Hyperlipemia   . Osteopenia    ?  Marland Kitchen Osteoporosis    dexa 06, declines further dexa or tx   . Smoker    Past Surgical History:  Procedure Laterality Date  . BONE MARROW BIOPSY  05/2003  .  CATARACT EXTRACTION    . Dexa     per patient ? osteopenia  . HEMORRHOID SURGERY    . JOINT REPLACEMENT Right Feb. 22, 2015   Partial Hip  . SPLENECTOMY    . TONSILLECTOMY AND ADENOIDECTOMY Bilateral age 65 per patient   Social History  Substance Use Topics  . Smoking status: Former Smoker    Packs/day: 0.50    Types: Cigarettes  . Smokeless tobacco: Never Used  . Alcohol use No   Family History  Problem Relation Age of Onset  . Uterine cancer Mother   . Heart disease Sister        some heart trouble   No Known Allergies Current Outpatient Prescriptions on File Prior to Visit  Medication Sig Dispense Refill  . calcium carbonate (TUMS EX) 750 MG chewable tablet Chew 1 tablet by mouth daily. Reported on 09/15/2015    . cholecalciferol (VITAMIN D) 1000 UNITS tablet Take 1,000 Units by mouth daily. Reported on 09/15/2015    . ELIQUIS 2.5 MG TABS tablet     . rosuvastatin (CRESTOR) 10 MG tablet Take 1 tablet (10 mg total) by mouth daily at 6 PM.     No current facility-administered medications on file prior to visit.     Review of Systems  Constitutional: Negative for activity change, appetite change, fatigue, fever and unexpected weight change.  HENT: Negative for congestion,  rhinorrhea, sore throat and trouble swallowing.   Eyes: Negative for pain, redness, itching and visual disturbance.  Respiratory: Negative for cough, chest tightness, shortness of breath and wheezing.   Cardiovascular: Negative for chest pain and palpitations.  Gastrointestinal: Negative for abdominal pain, blood in stool, constipation, diarrhea and nausea.  Endocrine: Negative for cold intolerance, heat intolerance, polydipsia and polyuria.  Genitourinary: Negative for difficulty urinating, dysuria, frequency and urgency.  Musculoskeletal: Negative for arthralgias, joint swelling and myalgias.  Skin: Positive for wound. Negative for pallor and rash.  Neurological: Negative for dizziness, tremors, weakness,  numbness and headaches.  Hematological: Negative for adenopathy. Does not bruise/bleed easily.  Psychiatric/Behavioral: Negative for decreased concentration and dysphoric mood. The patient is not nervous/anxious.        Objective:   Physical Exam  Constitutional: He appears well-developed and well-nourished. No distress.  Well appearing slim male  Skin: Skin is warm and dry. No rash noted. There is erythema. No pallor.  Large infected sebaceous cyst 2-3 cm on R upper back with several openings not actively draining  After cleaning/prepping in sterile fashion and use of freeze spray  was able to I and D with 1 pass of scalpel through one of the openings Large amt of caseous material and pus expressed with relief  Dressed with abx oint and bandage (pt tolerated well)           Assessment & Plan:   Problem List Items Addressed This Visit      Musculoskeletal and Integument   Sebaceous cyst - Primary    Infected cyst did not respond to keflex  I and D done through one of the openings already started and tolerated well  Dressed with abx ointment and bandage Wound care discussed  Finish keflex/ added bactrim DS to cover MRSA  Watch for inc redness or signs of cellulitis  F/u fri for re check  Has been unable to get cyst fully removed due to recent afib and new anticoagulation

## 2017-03-03 ENCOUNTER — Encounter: Payer: Self-pay | Admitting: Family Medicine

## 2017-03-03 ENCOUNTER — Ambulatory Visit (INDEPENDENT_AMBULATORY_CARE_PROVIDER_SITE_OTHER): Payer: Medicare HMO | Admitting: Family Medicine

## 2017-03-03 VITALS — BP 128/68 | HR 79 | Temp 97.1°F | Ht 66.75 in | Wt 125.0 lb

## 2017-03-03 DIAGNOSIS — L723 Sebaceous cyst: Secondary | ICD-10-CM

## 2017-03-03 NOTE — Progress Notes (Signed)
Subjective:    Patient ID: Daniel Chapman, male    DOB: 05/15/1932, 83 y.o.   MRN: [id number]  HPI Here for f/u of infected seb cyst   This was I and D last visit  Bactrim DS was added   (pill is big but he takes it in 1/2s )   It itches more and hurts less  Smaller  Less red   Original plan to remove it -but cancelled due to new a fib and blood thinner (eliquis)   Patient Active Problem List   Diagnosis Date Noted  . Carotid occlusion, right   . Stroke ([place]) 02/06/2017  . Atrial fibrillation, new onset ([place]) 02/06/2017  . Sebaceous cyst 01/03/2017  . Encounter for Medicare annual wellness exam 12/17/2014  . Special screening for malignant neoplasms, colon 12/17/2014  . Routine general medical examination at a health care facility 12/09/2014  . Aneurysm of abdominal vessel ([place]) 09/11/2013  . S/P total hip arthroplasty 08/02/2013  . H/O splenectomy 08/02/2013  . History of hip fracture 07/20/2013  . Occlusion and stenosis of carotid artery without mention of cerebral infarction 08/28/2012  . Carotid stenosis 08/14/2012  . Bruit of left carotid artery 08/01/2012  . Anxiety 01/27/2012  . Osteopenia 07/19/2011  . Prostate cancer screening 07/10/2011  . HOARSENESS, CHRONIC 02/05/2007  . 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 Hyperlipidemia 11/06/2006  . TOBACCO ABUSE 11/06/2006  . COPD (chronic obstructive pulmonary disease) ([place]) 11/06/2006  . COLONIC POLYPS, HX OF 11/06/2006  . PROSTATITIS, HX OF 11/06/2006  . 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 Carcinoma of vocal cord [facility]) 2008   radiation therapy  . Carotid artery occlusion   . COPD (chronic obstructive pulmonary disease) ([place])   . Fall due to ice or snow Feb. 22, 2015   Partial Right Hip replaced  . History of ITP   . Hx of colonic polyps   . Hyperlipemia   . Osteopenia    ?  Marland Kitchen Osteoporosis    dexa 06, declines further dexa or tx   . Smoker     Past Surgical History:  Procedure Laterality Date  . BONE MARROW BIOPSY  05/2003  . CATARACT EXTRACTION    . Dexa     per patient ? osteopenia  . HEMORRHOID SURGERY    . JOINT REPLACEMENT Right Feb. 22, 2015   Partial Hip  . SPLENECTOMY    . TONSILLECTOMY AND ADENOIDECTOMY Bilateral age 60 per patient   Social History  Substance Use Topics  . Smoking status: Former Smoker    Packs/day: 0.50    Types: Cigarettes  . Smokeless tobacco: Never Used  . Alcohol use No   Family History  Problem Relation Age of Onset  . Uterine cancer Mother   . Heart disease Sister        some heart trouble   No Known Allergies Current Outpatient Prescriptions on File Prior to Visit  Medication Sig Dispense Refill  . calcium carbonate (TUMS EX) 750 MG chewable tablet Chew 1 tablet by mouth daily. Reported on 09/15/2015    . cholecalciferol (VITAMIN D) 1000 UNITS tablet Take 1,000 Units by mouth daily. Reported on 09/15/2015    . ELIQUIS 2.5 MG TABS tablet     . rosuvastatin (CRESTOR) 10 MG tablet Take 1 tablet (10 mg total) by mouth daily at 6 PM.    . sulfamethoxazole-trimethoprim (BACTRIM DS,SEPTRA DS) 800-160 MG tablet Take 1 tablet  by mouth 2 (two) times daily. 20 tablet 0   No current facility-administered medications on file prior to visit.     Review of Systems  Constitutional: Negative for activity change, appetite change, fatigue, fever and unexpected weight change.  HENT: Negative for congestion, rhinorrhea, sore throat and trouble swallowing.   Eyes: Negative for pain, redness, itching and visual disturbance.  Respiratory: Negative for cough, chest tightness, shortness of breath and wheezing.   Cardiovascular: Negative for chest pain and palpitations.  Gastrointestinal: Negative for abdominal pain, blood in stool, constipation, diarrhea and nausea.  Endocrine: Negative for cold intolerance, heat intolerance, polydipsia and polyuria.  Genitourinary: Negative for difficulty urinating,  dysuria, frequency and urgency.  Musculoskeletal: Negative for arthralgias, joint swelling and myalgias.  Skin: Positive for wound. Negative for pallor and rash.  Neurological: Negative for dizziness, tremors, weakness, numbness and headaches.  Hematological: Negative for adenopathy. Does not bruise/bleed easily.  Psychiatric/Behavioral: Negative for decreased concentration and dysphoric mood. The patient is not nervous/anxious.        Objective:   Physical Exam  Constitutional: He appears well-developed and well-nourished. No distress.  HENT:  Head: Normocephalic and atraumatic.  Poor dentition   Eyes: Pupils are equal, round, and reactive to light. Conjunctivae and EOM are normal.  Neck: Normal range of motion. Neck supple.  Cardiovascular: Normal rate and normal heart sounds.   Skin: Skin is warm and dry.  Large seb cyst R upper back- smaller with less erythema (no streaking)  Less tender Soft with 2 open areas Small amt of caseous material expressed   Cleaned and dressed with abx ointment and bandage    Psychiatric: He has a normal mood and affect.          Assessment & Plan:   Problem List Items Addressed This Visit      Musculoskeletal and Integument   Sebaceous cyst - Primary    Much improvement after I and D and with bactrim DS Will finish antibiotic Keep clean and dress with abx oint daily  Expect cyst will need to be totally removed in the future (when cleared by cardiology)   Continue to watch  Will extend abx if needed when done- pt will call to update re: symptoms

## 2017-03-03 NOTE — Assessment & Plan Note (Signed)
Much improvement after I and D and with bactrim DS Will finish antibiotic Keep clean and dress with abx oint daily  Expect cyst will need to be totally removed in the future (when cleared by cardiology)   Continue to watch  Will extend abx if needed when done- pt will call to update re: symptoms

## 2017-03-03 NOTE — Patient Instructions (Signed)
Finish the antibiotics  The wound will continue to drain  It looks much better! Continue to dress with antibiotic ointment and bandage   We will eventually want to get the cyst removed by dermatology   See cardiology as planned

## 2017-03-20 NOTE — Progress Notes (Addendum)
 Cardiology Office Note    Date:  03/21/2017   ID:  Daniel Chapman, DOB 02/11/1932, MRN [id number]  PCP:  Tower, Marne A, MD  Cardiologist: Dr. Nelson  Chief Complaint  Patient presents with  . Follow-up    History of Present Illness:  Daniel Chapman is a 83 y.o. male with history of acute CVA 02/06/17 felt secondary to newly diagnosed atrial fibrillation. Echo showed normal LVEF and normal LA size. Neurology recommended Plavix initially 1 week then switched to Eliquis 2.5 mg twice a day given age of greater than 80 and weight loss and 60 kg. He was placed on statin therapy with Crestor. Plan was for 4 weeks of anticoagulation and then cardioversion. He saw Brittany Simmons, PA-C in follow-up to 02/23/17. Was never placed on rate controlling medications and blood pressure is too low to add them. Also has HDL.  Patient here today to plan cardioversion is still in atrial fibrillation. He is asymptomatic. Chronic dyspnea from COPD/on going tobacco abuse.Has missed any doses of Eliquis.    Past Medical History:  Diagnosis Date  . Carcinoma of vocal cord ([facility]) 2008   radiation therapy  . Carotid artery occlusion   . COPD (chronic obstructive pulmonary disease) ([facility])   . Fall due to ice or snow Feb. 22, 2015   Partial Right Hip replaced  . History of ITP   . Hx of colonic polyps   . Hyperlipemia   . Osteopenia    ?  . Osteoporosis    dexa 06, declines further dexa or tx   . Smoker     Past Surgical History:  Procedure Laterality Date  . BONE MARROW BIOPSY  05/2003  . CATARACT EXTRACTION    . Dexa     per patient ? osteopenia  . HEMORRHOID SURGERY    . JOINT REPLACEMENT Right Feb. 22, 2015   Partial Hip  . SPLENECTOMY    . TONSILLECTOMY AND ADENOIDECTOMY Bilateral age 15 per patient    Current Medications: Current Meds  Medication Sig  . calcium carbonate (TUMS EX) 750 MG chewable tablet Chew 1 tablet by mouth daily. Reported on 09/15/2015  . cholecalciferol (VITAMIN  D) 1000 UNITS tablet Take 1,000 Units by mouth daily. Reported on 09/15/2015  . ELIQUIS 2.5 MG TABS tablet   . rosuvastatin (CRESTOR) 10 MG tablet Take 1 tablet (10 mg total) by mouth daily at 6 PM.     Allergies:   Patient has no known allergies.   Social History   Social History  . Marital status: Married    Spouse name: N/A  . Number of children: 6  . Years of education: N/A   Occupational History  . Ice business Self-Employed   Social History Main Topics  . Smoking status: Current Every Day Smoker    Packs/day: 0.50    Types: Cigarettes  . Smokeless tobacco: Never Used  . Alcohol use No  . Drug use: No  . Sexual activity: No   Other Topics Concern  . None   Social History Narrative  . None     Family History:  The patient's   family history includes Heart disease in his sister; Uterine cancer in his mother.   ROS:   Please see the history of present illness.    Review of Systems  Constitution: Negative.  HENT: Negative.   Cardiovascular: Positive for dyspnea on exertion.  Respiratory: Negative.   Endocrine: Negative.   Hematologic/Lymphatic: Negative.   Musculoskeletal: Negative.     Gastrointestinal: Negative.   Genitourinary: Negative.   Neurological: Negative.    All other systems reviewed and are negative.   PHYSICAL EXAM:   VS:  BP 118/70   Pulse 92   Ht 5' 7.5" (1.715 m)   Wt 128 lb 8 oz (58.3 kg)   BMI 19.83 kg/m   Physical Exam  GEN: Well nourished, well developed, in no acute distress  HEENT: Poor dentition Neck: no JVD, carotid bruits, or masses Cardiac: Distant heart sounds, irregular irregular Respiratory:  Decreased breath sounds throughout GI: soft, nontender, nondistended, + BS Ext: without cyanosis, clubbing, or edema, Good distal pulses bilaterally Neuro:  Alert and Oriented x 3 Psych: euthymic mood, full affect  Wt Readings from Last 3 Encounters:  03/21/17 128 lb 8 oz (58.3 kg)  03/03/17 125 lb (56.7 kg)  02/24/17 124 lb  (56.2 kg)      Studies/Labs Reviewed:   EKG:  EKG is  ordered today.  The ekg ordered today demonstrates atrial fibrillation at 82 bpm  Recent Labs: 02/06/2017: ALT 17; TSH 0.722 02/24/2017: BUN 19; Creatinine, Ser 0.77; Hemoglobin 12.8; Platelets 242.0; Potassium 4.3; Sodium 136   Lipid Panel    Component Value Date/Time   CHOL 148 02/06/2017 0947   TRIG 116 02/06/2017 0947   HDL 29 (L) 02/06/2017 0947   CHOLHDL 5.1 02/06/2017 0947   VLDL 23 02/06/2017 0947   LDLCALC 96 02/06/2017 0947    Additional studies/ records that were reviewed today include:   2-D echo 9/10/18Study Conclusions   - Left ventricle: The cavity size was normal. Wall thickness was   normal. Systolic function was vigorous. The estimated ejection   fraction was in the range of 65% to 70%. - Aortic valve: Moderately calcified annulus. Mildly thickened   leaflets. - Mitral valve: There was mild regurgitation.  LA size was normal    ASSESSMENT:    1. Atrial fibrillation, new onset ([facility])   2. Cerebrovascular accident (CVA) due to thrombosis of other precerebral artery ([facility])   3. Mixed hyperlipidemia      PLAN:  In order of problems listed above:  Atrial fibrillation on Eliquis 2.5 mg twice a day because of age greater than 80 and weight loss and 60 kg. He hasn't sustained doses in the past month. CHADSVASC=4Plan for cardioversion.Patient agreeable. Check labs today. Follow-up with Dr. Nelson as an outpatient.  CVA in the setting of new atrial fibrillation  Hyperlipidemia on Crestor  Addendum labs checked today and potassium normal at 4.4 BUN 20 creatinine 0.87 hemoglobin 12.3 hematocrit 35, platelets 204. Stable for cardioversion tomorrow.  Medication Adjustments/Labs and Tests Ordered: Current medicines are reviewed at length with the patient today.  Concerns regarding medicines are outlined above.  Medication changes, Labs and Tests ordered today are listed in the Patient Instructions  below. Patient Instructions  Medication Instructions:     If you need a refill on your cardiac medications before your next appointment, please call your pharmacy.  Labwork:   Testing/Procedures:   Follow-Up:    Any Other Special Instructions Will Be Listed Below (If Applicable).                                                                                                                                                        Signed, Michele Lenze, PA-C  03/21/2017 10:26 AM    [facility] [address], [place], [place]  [address] Phone: [phone]; Fax: [phone]    

## 2017-03-21 ENCOUNTER — Encounter: Payer: Self-pay | Admitting: Physician Assistant

## 2017-03-21 ENCOUNTER — Telehealth: Payer: Self-pay | Admitting: *Deleted

## 2017-03-21 ENCOUNTER — Ambulatory Visit (INDEPENDENT_AMBULATORY_CARE_PROVIDER_SITE_OTHER): Payer: Medicare HMO | Admitting: Physician Assistant

## 2017-03-21 ENCOUNTER — Other Ambulatory Visit: Payer: Medicare HMO

## 2017-03-21 ENCOUNTER — Encounter: Payer: Self-pay | Admitting: *Deleted

## 2017-03-21 VITALS — BP 118/70 | HR 92 | Ht 67.5 in | Wt 128.5 lb

## 2017-03-21 DIAGNOSIS — E782 Mixed hyperlipidemia: Secondary | ICD-10-CM

## 2017-03-21 DIAGNOSIS — I4891 Unspecified atrial fibrillation: Secondary | ICD-10-CM

## 2017-03-21 DIAGNOSIS — I6309 Cerebral infarction due to thrombosis of other precerebral artery: Secondary | ICD-10-CM

## 2017-03-21 LAB — CBC
HEMOGLOBIN: 12.3 g/dL — AB (ref 13.0–17.7)
Hematocrit: 35.3 % — ABNORMAL LOW (ref 37.5–51.0)
MCH: 30.2 pg (ref 26.6–33.0)
MCHC: 34.8 g/dL (ref 31.5–35.7)
MCV: 87 fL (ref 79–97)
Platelets: 204 10*3/uL (ref 150–379)
RBC: 4.07 x10E6/uL — ABNORMAL LOW (ref 4.14–5.80)
RDW: 14.9 % (ref 12.3–15.4)
WBC: 7.9 10*3/uL (ref 3.4–10.8)

## 2017-03-21 LAB — BASIC METABOLIC PANEL
BUN/Creatinine Ratio: 23 (ref 10–24)
BUN: 20 mg/dL (ref 8–27)
CALCIUM: 9.7 mg/dL (ref 8.6–10.2)
CHLORIDE: 106 mmol/L (ref 96–106)
CO2: 28 mmol/L (ref 20–29)
Creatinine, Ser: 0.87 mg/dL (ref 0.76–1.27)
GFR, EST AFRICAN AMERICAN: 92 mL/min/{1.73_m2} (ref >59)
GFR, EST NON AFRICAN AMERICAN: 80 mL/min/{1.73_m2} (ref >59)
Glucose: 104 mg/dL — ABNORMAL HIGH (ref 65–99)
Potassium: 4.4 mmol/L (ref 3.5–5.2)
Sodium: 142 mmol/L (ref 134–144)

## 2017-03-21 MED ORDER — ELIQUIS 2.5 MG PO TABS: 2.5000 mg | ORAL_TABLET | Freq: Two times a day (BID) | ORAL | 3 refills | Status: DC

## 2017-03-21 MED ORDER — ROSUVASTATIN CALCIUM 10 MG PO TABS: 10.0000 mg | ORAL_TABLET | Freq: Every day | ORAL | 3 refills | Status: DC

## 2017-03-21 NOTE — Addendum Note (Signed)
Addended by: Claude Manges on: 03/21/2017 11:11 AM   Modules accepted: Orders

## 2017-03-21 NOTE — Telephone Encounter (Signed)
PT WIFE (DPR) AWARE OF RESULTS FOR HUSBAND CARDIOVERSION 03-22-17 AND RX OF CRESTOR SENT IN

## 2017-03-21 NOTE — Patient Instructions (Addendum)
Medication Instructions:   Your physician recommends that you continue on your current medications as directed. Please refer to the Current Medication list given to you today.    If you need a refill on your cardiac medications before your next appointment, please call your pharmacy.  Labwork:  CBC AND BMET  STAT TODAY    Testing/Procedures:  SEE LETTER FOR CARDIOVERSION  ON 03-22-17    Follow-Up:  2 WEEKS AFTER 03-22-17 WITH  [facility] OR [facility] POST CARDIOVERSION   WITH DR. Meda Coffee IN January    Any Other Special Instructions Will Be Listed Below (If Applicable).

## 2017-03-22 ENCOUNTER — Encounter (HOSPITAL_COMMUNITY): Payer: Self-pay | Admitting: *Deleted

## 2017-03-22 ENCOUNTER — Ambulatory Visit (HOSPITAL_COMMUNITY)
Admission: RE | Admit: 2017-03-22 | Discharge: 2017-03-22 | Disposition: A | Discharge status: Home / Self Care | Payer: Medicare HMO | Source: Ambulatory Visit | Attending: Cardiovascular Disease | Admitting: Cardiovascular Disease

## 2017-03-22 ENCOUNTER — Encounter (HOSPITAL_COMMUNITY)
Admission: RE | Disposition: A | Discharge status: Home / Self Care | Payer: Self-pay | Source: Ambulatory Visit | Attending: Cardiovascular Disease

## 2017-03-22 ENCOUNTER — Ambulatory Visit (HOSPITAL_COMMUNITY): Payer: Medicare HMO | Admitting: Certified Registered"

## 2017-03-22 DIAGNOSIS — E782 Mixed hyperlipidemia: Secondary | ICD-10-CM | POA: Diagnosis not present

## 2017-03-22 DIAGNOSIS — Z79899 Other long term (current) drug therapy: Secondary | ICD-10-CM | POA: Insufficient documentation

## 2017-03-22 DIAGNOSIS — R634 Abnormal weight loss: Secondary | ICD-10-CM | POA: Diagnosis not present

## 2017-03-22 DIAGNOSIS — Z8249 Family history of ischemic heart disease and other diseases of the circulatory system: Secondary | ICD-10-CM | POA: Diagnosis not present

## 2017-03-22 DIAGNOSIS — Z7901 Long term (current) use of anticoagulants: Secondary | ICD-10-CM | POA: Diagnosis not present

## 2017-03-22 DIAGNOSIS — F17210 Nicotine dependence, cigarettes, uncomplicated: Secondary | ICD-10-CM | POA: Diagnosis not present

## 2017-03-22 DIAGNOSIS — M810 Age-related osteoporosis without current pathological fracture: Secondary | ICD-10-CM | POA: Diagnosis not present

## 2017-03-22 DIAGNOSIS — Z7902 Long term (current) use of antithrombotics/antiplatelets: Secondary | ICD-10-CM | POA: Insufficient documentation

## 2017-03-22 DIAGNOSIS — J449 Chronic obstructive pulmonary disease, unspecified: Secondary | ICD-10-CM | POA: Diagnosis not present

## 2017-03-22 DIAGNOSIS — E785 Hyperlipidemia, unspecified: Secondary | ICD-10-CM | POA: Diagnosis not present

## 2017-03-22 DIAGNOSIS — I739 Peripheral vascular disease, unspecified: Secondary | ICD-10-CM | POA: Insufficient documentation

## 2017-03-22 DIAGNOSIS — Z923 Personal history of irradiation: Secondary | ICD-10-CM | POA: Diagnosis not present

## 2017-03-22 DIAGNOSIS — Z8521 Personal history of malignant neoplasm of larynx: Secondary | ICD-10-CM | POA: Insufficient documentation

## 2017-03-22 DIAGNOSIS — I639 Cerebral infarction, unspecified: Secondary | ICD-10-CM | POA: Diagnosis not present

## 2017-03-22 DIAGNOSIS — R69 Illness, unspecified: Secondary | ICD-10-CM | POA: Diagnosis not present

## 2017-03-22 DIAGNOSIS — Z86010 Personal history of colonic polyps: Secondary | ICD-10-CM | POA: Insufficient documentation

## 2017-03-22 DIAGNOSIS — Z8673 Personal history of transient ischemic attack (TIA), and cerebral infarction without residual deficits: Secondary | ICD-10-CM | POA: Diagnosis not present

## 2017-03-22 DIAGNOSIS — I4891 Unspecified atrial fibrillation: Secondary | ICD-10-CM | POA: Diagnosis not present

## 2017-03-22 HISTORY — PX: CARDIOVERSION: SHX1299

## 2017-03-22 SURGERY — CARDIOVERSION
Anesthesia: General

## 2017-03-22 MED ORDER — SODIUM CHLORIDE 0.9 % IV SOLN
250.0000 mL | INTRAVENOUS | Status: DC
  Administered 2017-03-22: 09:00:00 via INTRAVENOUS

## 2017-03-22 MED ORDER — LIDOCAINE 2% (20 MG/ML) 5 ML SYRINGE
INTRAMUSCULAR | Status: DC | PRN
  Administered 2017-03-22: 40 mg via INTRAVENOUS

## 2017-03-22 MED ORDER — SODIUM CHLORIDE 0.9% FLUSH: 3.0000 mL | INTRAVENOUS | Status: DC | PRN

## 2017-03-22 MED ORDER — SODIUM CHLORIDE 0.9% FLUSH: 3.0000 mL | Freq: Two times a day (BID) | INTRAVENOUS | Status: DC

## 2017-03-22 MED ORDER — PROPOFOL 10 MG/ML IV BOLUS
INTRAVENOUS | Status: DC | PRN
  Administered 2017-03-22: 60 mg via INTRAVENOUS

## 2017-03-22 MED ORDER — HYDROCORTISONE 1 % EX CREA
1.0000 "application " | TOPICAL_CREAM | Freq: Three times a day (TID) | CUTANEOUS | Status: DC | PRN
  Filled 2017-03-22: qty 28

## 2017-03-22 NOTE — Anesthesia Postprocedure Evaluation (Signed)
Anesthesia Post Note  Patient: Daniel Chapman  Procedure(s) Performed: CARDIOVERSION (N/A )     Patient location during evaluation: Endoscopy Anesthesia Type: General Level of consciousness: awake Pain management: pain level controlled Vital Signs Assessment: post-procedure vital signs reviewed and stable Respiratory status: spontaneous breathing Cardiovascular status: stable Anesthetic complications: no    Last Vitals:  Vitals:   03/22/17 0917 03/22/17 1001  BP: 121/87 106/65  Pulse:  74  Resp: (!) 25 (!) 22  Temp: (!) 36.2 C 36.4 C  SpO2: 99% 97%    Last Pain:  Vitals:   03/22/17 1001  TempSrc: Axillary                 Elis Sauber

## 2017-03-22 NOTE — Anesthesia Preprocedure Evaluation (Addendum)
Anesthesia Evaluation  Patient identified by MRN, date of birth, ID band Patient awake    Reviewed: Allergy & Precautions, NPO status , Patient's Chart, lab work & pertinent test results  Airway Mallampati: I  TM Distance: >3 FB Neck ROM: Full    Dental  (+) Poor Dentition, Missing, Chipped, Dental Advisory Given, Edentulous Upper   Pulmonary COPD, Current Smoker,    breath sounds clear to auscultation       Cardiovascular + Peripheral Vascular Disease   Rhythm:Irregular Rate:Normal     Neuro/Psych    GI/Hepatic negative GI ROS, Neg liver ROS,   Endo/Other  negative endocrine ROS  Renal/GU negative Renal ROS     Musculoskeletal   Abdominal   Peds  Hematology negative hematology ROS (+)   Anesthesia Other Findings   Reproductive/Obstetrics                            Anesthesia Physical Anesthesia Plan  ASA: III  Anesthesia Plan: General   Post-op Pain Management:    Induction: Intravenous  PONV Risk Score and Plan: 1 and Ondansetron, Dexamethasone and Treatment may vary due to age or medical condition  Airway Management Planned: Mask  Additional Equipment:   Intra-op Plan:   Post-operative Plan:   Informed Consent: I have reviewed the patients History and Physical, chart, labs and discussed the procedure including the risks, benefits and alternatives for the proposed anesthesia with the patient or authorized representative who has indicated his/her understanding and acceptance.   Dental advisory given  Plan Discussed with: Anesthesiologist, CRNA and Surgeon  Anesthesia Plan Comments:         Anesthesia Quick Evaluation

## 2017-03-22 NOTE — Interval H&P Note (Signed)
History and Physical Interval Note:  03/22/2017 9:49 AM  Daniel Chapman  has presented today for surgery, with the diagnosis of a-fib  The various methods of treatment have been discussed with the patient and family. After consideration of risks, benefits and other options for treatment, the patient has consented to  Procedure(s): CARDIOVERSION (N/A) as a surgical intervention .  The patient's history has been reviewed, patient examined, no change in status, stable for surgery.  I have reviewed the patient's chart and labs.  Questions were answered to the patient's satisfaction.     Mertie Moores

## 2017-03-22 NOTE — Transfer of Care (Signed)
Immediate Anesthesia Transfer of Care Note  Patient: Daniel Chapman  Procedure(s) Performed: CARDIOVERSION (N/A )  Patient Location: Endoscopy Unit  Anesthesia Type:General  Level of Consciousness: lethargic and responds to stimulation  Airway & Oxygen Therapy: Patient Spontanous Breathing  Post-op Assessment: Report given to RN  Post vital signs: Reviewed and stable  Last Vitals:  Vitals:   03/22/17 0917  BP: 121/87  Resp: (!) 25  Temp: (!) 36.2 C  SpO2: 99%    Last Pain:  Vitals:   03/22/17 0917  TempSrc: Oral         Complications: No apparent anesthesia complications

## 2017-03-22 NOTE — CV Procedure (Signed)
    Cardioversion Note  Daniel Chapman [id number] 17-Mar-1932  Procedure: DC Cardioversion Indications: Atrial fib   Procedure Details Consent: Obtained Time Out: Verified patient identification, verified procedure, site/side was marked, verified correct patient position, special equipment/implants available, Radiology Safety Procedures followed,  medications/allergies/relevent history reviewed, required imaging and test results available.  Performed  The patient has been on adequate anticoagulation.  The patient received Lidocaine 40 mg IV followed by Propofol 60 mg IV  for sedation.  Synchronous cardioversion was performed at 120 joules.  The cardioversion was successful    Complications: No apparent complications Patient did tolerate procedure well.   Thayer Headings, Brooke Bonito., MD, [facility] 03/22/2017, 9:53 AM

## 2017-03-22 NOTE — H&P (View-Only) (Signed)
Cardiology Office Note    Date:  03/21/2017   ID:  Daniel Chapman, DOB [birth year], MRN [id number]  PCP:  Daniel Greenspan, MD  Cardiologist: Dr. Meda Coffee  Chief Complaint  Patient presents with  . Follow-up    History of Present Illness:  Daniel Chapman is a 83 y.o. male with history of acute CVA 02/06/17 felt secondary to newly diagnosed atrial fibrillation. Echo showed normal LVEF and normal LA size. Neurology recommended Plavix initially 1 week then switched to Eliquis 2.5 mg twice a day given age of greater than 80 and weight loss and 60 kg. He was placed on statin therapy with Crestor. Plan was for 4 weeks of anticoagulation and then cardioversion. He saw Ellen Henri, PA-C in follow-up to 02/23/17. Was never placed on rate controlling medications and blood pressure is too low to add them. Also has HDL.  Patient here today to plan cardioversion is still in atrial fibrillation. He is asymptomatic. Chronic dyspnea from COPD/on going tobacco abuse.Has missed any doses of Eliquis.    Past Medical History:  Diagnosis Date  . Carcinoma of vocal cord [facility]) 2008   radiation therapy  . Carotid artery occlusion   . COPD (chronic obstructive pulmonary disease) ([place])   . Fall due to ice or snow Feb. 22, 2015   Partial Right Hip replaced  . History of ITP   . Hx of colonic polyps   . Hyperlipemia   . Osteopenia    ?  Marland Kitchen Osteoporosis    dexa 06, declines further dexa or tx   . Smoker     Past Surgical History:  Procedure Laterality Date  . BONE MARROW BIOPSY  05/2003  . CATARACT EXTRACTION    . Dexa     per patient ? osteopenia  . HEMORRHOID SURGERY    . JOINT REPLACEMENT Right Feb. 22, 2015   Partial Hip  . SPLENECTOMY    . TONSILLECTOMY AND ADENOIDECTOMY Bilateral age 64 per patient    Current Medications: Current Meds  Medication Sig  . calcium carbonate (TUMS EX) 750 MG chewable tablet Chew 1 tablet by mouth daily. Reported on 09/15/2015  . cholecalciferol (VITAMIN  D) 1000 UNITS tablet Take 1,000 Units by mouth daily. Reported on 09/15/2015  . ELIQUIS 2.5 MG TABS tablet   . rosuvastatin (CRESTOR) 10 MG tablet Take 1 tablet (10 mg total) by mouth daily at 6 PM.     Allergies:   Patient has no known allergies.   Social History   Social History  . Marital status: Married    Spouse name: N/A  . Number of children: 6  . Years of education: N/A   Occupational History  . Ice business Self-Employed   Social History Main Topics  . Smoking status: Current Every Day Smoker    Packs/day: 0.50    Types: Cigarettes  . Smokeless tobacco: Never Used  . Alcohol use No  . Drug use: No  . Sexual activity: No   Other Topics Concern  . None   Social History Narrative  . None     Family History:  The patient's   family history includes Heart disease in his sister; Uterine cancer in his mother.   ROS:   Please see the history of present illness.    Review of Systems  Constitution: Negative.  HENT: Negative.   Cardiovascular: Positive for dyspnea on exertion.  Respiratory: Negative.   Endocrine: Negative.   Hematologic/Lymphatic: Negative.   Musculoskeletal: Negative.  Gastrointestinal: Negative.   Genitourinary: Negative.   Neurological: Negative.    All other systems reviewed and are negative.   PHYSICAL EXAM:   VS:  BP 118/70   Pulse 92   Ht 5' 7.5" (1.715 m)   Wt 128 lb 8 oz (58.3 kg)   BMI 19.83 kg/m   Physical Exam  GEN: Well nourished, well developed, in no acute distress  HEENT: Poor dentition Neck: no JVD, carotid bruits, or masses Cardiac: Distant heart sounds, irregular irregular Respiratory:  Decreased breath sounds throughout GI: soft, nontender, nondistended, + BS Ext: without cyanosis, clubbing, or edema, Good distal pulses bilaterally Neuro:  Alert and Oriented x 3 Psych: euthymic mood, full affect  Wt Readings from Last 3 Encounters:  03/21/17 128 lb 8 oz (58.3 kg)  03/03/17 125 lb (56.7 kg)  02/24/17 124 lb  (56.2 kg)      Studies/Labs Reviewed:   EKG:  EKG is  ordered today.  The ekg ordered today demonstrates atrial fibrillation at 82 bpm  Recent Labs: 02/06/2017: ALT 17; TSH 0.722 02/24/2017: BUN 19; Creatinine, Ser 0.77; Hemoglobin 12.8; Platelets 242.0; Potassium 4.3; Sodium 136   Lipid Panel    Component Value Date/Time   CHOL 148 02/06/2017 0947   TRIG 116 02/06/2017 0947   HDL 29 (L) 02/06/2017 0947   CHOLHDL 5.1 02/06/2017 0947   VLDL 23 02/06/2017 0947   LDLCALC 96 02/06/2017 0947    Additional studies/ records that were reviewed today include:   2-D echo 9/10/18Study Conclusions   - Left ventricle: The cavity size was normal. Wall thickness was   normal. Systolic function was vigorous. The estimated ejection   fraction was in the range of 65% to 70%. - Aortic valve: Moderately calcified annulus. Mildly thickened   leaflets. - Mitral valve: There was mild regurgitation.  LA size was normal    ASSESSMENT:    1. Atrial fibrillation, new onset ([place])   2. Cerebrovascular accident (CVA) due to thrombosis of other precerebral artery ([place])   3. Mixed hyperlipidemia      PLAN:  In order of problems listed above:  Atrial fibrillation on Eliquis 2.5 mg twice a day because of age greater than 36 and weight loss and 60 kg. He hasn't sustained doses in the past month. CHADSVASC=4Plan for cardioversion.Patient agreeable. Check labs today. Follow-up with Dr. Meda Coffee as an outpatient.  CVA in the setting of new atrial fibrillation  Hyperlipidemia on Crestor  Addendum labs checked today and potassium normal at 4.4 BUN 20 creatinine 0.87 hemoglobin 12.3 hematocrit 35, platelets 204. Stable for cardioversion tomorrow.  Medication Adjustments/Labs and Tests Ordered: Current medicines are reviewed at length with the patient today.  Concerns regarding medicines are outlined above.  Medication changes, Labs and Tests ordered today are listed in the Patient Instructions  below. Patient Instructions  Medication Instructions:     If you need a refill on your cardiac medications before your next appointment, please call your pharmacy.  Labwork:   Testing/Procedures:   Follow-Up:    Any Other Special Instructions Will Be Listed Below (If Applicable).  Sumner Boast, PA-C  03/21/2017 10:26 AM    [facility] [address], [place], [place]  [address] Phone: [phone]; Fax: [phone]

## 2017-03-23 ENCOUNTER — Encounter: Payer: Self-pay | Admitting: *Deleted

## 2017-03-23 ENCOUNTER — Telehealth: Payer: Self-pay | Admitting: Cardiology

## 2017-03-23 NOTE — Telephone Encounter (Signed)
Dental office will reschedule the pt pending clearance.  He will need one tooth extracted.  Dr Meda Coffee please advise on med and cardiac clearance for pts dental procedure. Clearance to be faxed to [phone] attention Dr Kalman Shan DDS.

## 2017-03-23 NOTE — Telephone Encounter (Signed)
New message     1. What dental office are you calling from? [name]   2. What is your office phone and fax number? [phone]   3. What type of procedure is the patient having performed?  extraction 4. What date is procedure scheduled or is the patient there now?  today  5. What is your question (ex. Antibiotics prior to procedure, holding medication-we need to know how long dentist wants pt to hold med)?

## 2017-03-23 NOTE — Telephone Encounter (Signed)
To Whom It May Concern,  [name] currently under my care and there is currently no contraindication from cardiac standpoint for him to undergo any dental procedure including to extraction. He doesn't need any antibiotic prophylaxis. However he should continue his Eliquis.  Please call us with any questions. Sincerely,  Ena Dawley, MD

## 2017-03-23 NOTE — Telephone Encounter (Signed)
Will fax this clearance to Dr Radford Pax Burton's office, as requested.

## 2017-03-25 ENCOUNTER — Encounter (HOSPITAL_COMMUNITY): Payer: Self-pay | Admitting: Cardiovascular Disease

## 2017-03-28 NOTE — Progress Notes (Signed)
[facility]  PATIENT: Daniel Chapman DOB: 11-Jan-1932   REASON FOR VISIT: Hospital follow-up for stroke HISTORY FROM: Patient and wife    HISTORY OF PRESENT ILLNESS:HISTORY OF PRESENT ILLNESS (per record) Daniel Chapman an 83 y.o.male with a history of newly-diagnosed atrial fibrillation, vocal cord cancer, tobacco abuse (current smoker), COPD, ITP (s/p splenectomy), and hyperlipidemia, who presented to [facility] 02/06/2017 for evaluation of new onset dizziness and incoordination. Symptoms also included difficulty speaking, headache, nausea and vomiting. The patient was knocking things onto the floor as a result of the incoordination. Per ED triage note:"His wife who came home yesterday afternoon and found the pt laying on the floor beside vomit and a coffee pot he knocked down. Initially pt was unable to speak, but eventually he was able to state that he was hit with "a bout of dizziness"."He has vomited three times since Sunday.  CT head in the ED revealed a large right inferior cerebellar hemisphere ischemic infarction. Increased density overlapping the vertebrobasilar junction was felt by Radiology to possibly represent thrombosis and a CTA was recommended.   EKG in the ED reveals atrial fibrillation. He has no prior history of such.  Patient was not administered IV t-PA secondary to arriving outside of the treatment window. He was admitted to General Neurology for further evaluation and treatment. SUBJECTIVE (INTERVAL HISTORY) No family is in the room.  The patient is awake, alert, and follows all commands appropriately. No complains. No acute event overnight. EKG showed afib. Pt denies any palpitation.  UPDATE 03/29/2017 CM Daniel Chapman, 83 year old male returns for hospital follow-up for right cerebellar PICA infarct, no hemorrhage. MRA of the head right ICA chronic occlusion. 2-D echo EF 60-65% no source of embolus. LDL 96. Hemoglobin A1c 6.2. Newly diagnosed with  atrial fibrillation and on follow-up as 2.5 mg twice daily due to a greater than 80. He has not had recurrent stroke or TIA symptoms. He has minimal bruising and no bleeding. Is also on Crestor for hyperlipidemia without myalgias. Blood pressure in the office today 116/74. He had cardioversion on 03/22/2017 by Dr.Nahser for his atrial fibrillation. Patient continues to smoke half pack a day and has no intention of quitting. He returns for reevaluation  REVIEW OF SYSTEMS: Full 14 system review of systems performed and notable only for those listed, all others are neg:  Constitutional: neg  Cardiovascular: neg Ear/Nose/Throat: neg  Skin: neg Eyes: neg Respiratory: COPD Gastroitestinal: neg  Hematology/Lymphatic:  easy bruising   Endocrine: neg Musculoskeletal:neg Allergy/Immunology: neg Neurological: neg Psychiatric: neg Sleep : neg   ALLERGIES: No Known Allergies  HOME MEDICATIONS: Outpatient Medications Prior to Visit  Medication Sig Dispense Refill  . calcium carbonate (TUMS EX) 750 MG chewable tablet Chew 1 tablet by mouth daily. Reported on 09/15/2015    . cholecalciferol (VITAMIN D) 1000 UNITS tablet Take 1,000 Units by mouth daily. Reported on 09/15/2015    . ELIQUIS 2.5 MG TABS tablet Take 1 tablet (2.5 mg total) by mouth 2 (two) times daily. 60 tablet 3  . rosuvastatin (CRESTOR) 10 MG tablet Take 1 tablet (10 mg total) by mouth daily at 6 PM. 30 tablet 3   No facility-administered medications prior to visit.     PAST MEDICAL HISTORY: Past Medical History:  Diagnosis Date  . Carcinoma of vocal cord [facility] 2008   radiation therapy  . Carotid artery occlusion   . COPD (chronic obstructive pulmonary disease) ([place])   . Fall due to ice or  snow Feb. 22, 2015   Partial Right Hip replaced  . History of ITP   . Hx of colonic polyps   . Hyperlipemia   . Osteopenia    ?  Marland Kitchen Osteoporosis    dexa 06, declines further dexa or tx   . Smoker     PAST SURGICAL HISTORY: Past  Surgical History:  Procedure Laterality Date  . BONE MARROW BIOPSY  05/2003  . CARDIOVERSION N/A 03/22/2017   Procedure: CARDIOVERSION;  Surgeon: Acie Fredrickson Wonda Cheng, MD;  Location: [place];  Service: Cardiovascular;  Laterality: N/A;  . CATARACT EXTRACTION    . Dexa     per patient ? osteopenia  . HEMORRHOID SURGERY    . JOINT REPLACEMENT Right Feb. 22, 2015   Partial Hip  . SPLENECTOMY    . TONSILLECTOMY AND ADENOIDECTOMY Bilateral age 27 per patient    FAMILY HISTORY: Family History  Problem Relation Age of Onset  . Uterine cancer Mother   . Heart disease Sister        some heart trouble    SOCIAL HISTORY: Social History   Social History  . Marital status: Married    Spouse name: N/A  . Number of children: 6  . Years of education: N/A   Occupational History  . [employer] Self-Employed   Social History Main Topics  . Smoking status: Current Every Day Smoker    Packs/day: 0.50    Types: Cigarettes  . Smokeless tobacco: Never Used  . Alcohol use No  . Drug use: No  . Sexual activity: No   Other Topics Concern  . Not on file   Social History Narrative  . No narrative on file     PHYSICAL EXAM  Vitals:   03/29/17 1359  BP: 116/74  Pulse: 84  Weight: 127 lb 9.6 oz (57.9 kg)  Height: 5' 7.5" (1.715 m)   Body mass index is 19.69 kg/m.  Generalized: Well developed, in no acute distress teeth in poor repair Head: normocephalic and atraumatic,. Oropharynx benign  Neck: Supple, no carotid bruits  Cardiac: Regular rate rhythm, no murmur  Musculoskeletal: No deformity   Neurological examination   Mentation: Alert oriented to time, place, history taking. Fund of knowledge was assessed and impaired   Follows all commands speech and language fluent.   Cranial nerve II-XII: Pupils were equal round reactive to light extraocular movements were full, visual field were full on confrontational test. Facial sensation and strength were normal. hearing was  intact to finger rubbing bilaterally. Uvula tongue midline. head turning and shoulder shrug were normal and symmetric.Tongue protrusion into cheek strength was normal. Motor: normal bulk and tone, full strength in the BUE, BLE, fine finger movements normal, no pronator drift. Sensory: normal and symmetric to light touch, pinprick, and  Vibration, in the upper and lower extremities  Coordination: finger-nose-finger, heel-to-shin bilaterally, no dysmetria Reflexes: 1+ upper lower and symmetric, plantar responses were flexor bilaterally. Gait and Station: Rising up from seated position without assistance, normal stance,  moderate stride, good arm swing, smooth turning, able to perform tiptoe, and heel walking without difficulty. Tandem gait is unsteady  DIAGNOSTIC DATA (LABS, IMAGING, TESTING) - I reviewed patient records, labs, notes, testing and imaging myself where available.  Lab Results  Component Value Date   WBC 7.9 03/21/2017   HGB 12.3 (L) 03/21/2017   HCT 35.3 (L) 03/21/2017   MCV 87 03/21/2017   PLT 204 03/21/2017      Component Value Date/Time  NA 142 03/21/2017 1058   K 4.4 03/21/2017 1058   CL 106 03/21/2017 1058   CO2 28 03/21/2017 1058   GLUCOSE 104 (H) 03/21/2017 1058   GLUCOSE 92 02/24/2017 1306   BUN 20 03/21/2017 1058   CREATININE 0.87 03/21/2017 1058   CALCIUM 9.7 03/21/2017 1058   PROT 7.4 02/06/2017 0947   ALBUMIN 3.7 02/06/2017 0947   AST 29 02/06/2017 0947   ALT 17 02/06/2017 0947   ALKPHOS 52 02/06/2017 0947   BILITOT 1.0 02/06/2017 0947   GFRNONAA 80 03/21/2017 1058   GFRAA 92 03/21/2017 1058   Lab Results  Component Value Date   CHOL 148 02/06/2017   HDL 29 (L) 02/06/2017   LDLCALC 96 02/06/2017   TRIG 116 02/06/2017   CHOLHDL 5.1 02/06/2017   Lab Results  Component Value Date   HGBA1C 6.2 (H) 02/06/2017    Lab Results  Component Value Date   TSH 0.722 02/06/2017      ASSESSMENT AND PLAN  83 y.o. year old male  with history  presenting with newly diagnosed atrial fibrillation vocal cord cancer tobacco abuse COPD hyperlipidemia who presented to the emergency room for evaluation of new onset dizziness and incoordination symptoms also included difficulty speaking headache nausea and vomiting.MRI brain right cerebellar PICA infarct, no hemorrhage. MRA of the head right ICA chronic occlusion. 2-D echo EF 60-65% no source of embolus. LDL 96. Hemoglobin A1c 6.2. Had cardioversion in 10/24/ 2018 for atrial fibrillation  Stressed the importance of management of risk factors to prevent further stroke ContinueEliquis for secondary stroke prevention Maintain strict control of hypertension with blood pressure goal below 130/90, today's reading116/74  Control of diabetes with hemoglobin A1c below 6.5 followed by primary care most recent hemoglobin A1c6.2 Cholesterol with LDL cholesterol less than 70, followed by primary care,  most recent 96 continue crestor Exercise by walking, slowly increase ,  eat healthy diet with whole grains,  fresh fruits and vegetables Right ICA chronic occlusion to be followed with [facility] as outpt.  F/U in 6 months Discussed risk for recurrent stroke/ TIA and answered additional questions This was a visit requiring 25 minutes and medical decision making of high complexity with extensive review of history, hospital chart, counseling and answering questions Dennie Bible, [facility], [facility], APRN  [facility] [address], [place], [place] [address] [phone]

## 2017-03-29 ENCOUNTER — Encounter: Payer: Self-pay | Admitting: Nurse Practitioner

## 2017-03-29 ENCOUNTER — Ambulatory Visit (INDEPENDENT_AMBULATORY_CARE_PROVIDER_SITE_OTHER): Payer: Medicare HMO | Admitting: Nurse Practitioner

## 2017-03-29 VITALS — BP 116/74 | HR 84 | Ht 67.5 in | Wt 127.6 lb

## 2017-03-29 DIAGNOSIS — F17200 Nicotine dependence, unspecified, uncomplicated: Secondary | ICD-10-CM

## 2017-03-29 DIAGNOSIS — E782 Mixed hyperlipidemia: Secondary | ICD-10-CM | POA: Diagnosis not present

## 2017-03-29 DIAGNOSIS — R69 Illness, unspecified: Secondary | ICD-10-CM | POA: Diagnosis not present

## 2017-03-29 DIAGNOSIS — I6309 Cerebral infarction due to thrombosis of other precerebral artery: Secondary | ICD-10-CM

## 2017-03-29 NOTE — Patient Instructions (Signed)
Stressed the importance of management of risk factors to prevent further stroke ContinueEliquis for secondary stroke prevention Maintain strict control of hypertension with blood pressure goal below 130/90, today's reading116/74  Control of diabetes with hemoglobin A1c below 6.5 followed by primary care most recent hemoglobin A1c6.2 Cholesterol with LDL cholesterol less than 70, followed by primary care,  most recent 96 continue crestor Exercise by walking, slowly increase ,  eat healthy diet with whole grains,  fresh fruits and vegetables F/U in 6 months

## 2017-03-30 NOTE — Progress Notes (Signed)
I reviewed above note and agree with the assessment and plan.   Rosalin Hawking, MD PhD Stroke Neurology 03/30/2017 3:02 PM

## 2017-04-05 ENCOUNTER — Encounter: Payer: Self-pay | Admitting: Cardiology

## 2017-04-05 ENCOUNTER — Ambulatory Visit: Payer: Medicare HMO | Admitting: Cardiology

## 2017-04-05 VITALS — BP 106/74 | HR 81 | Ht 67.5 in | Wt 127.1 lb

## 2017-04-05 DIAGNOSIS — I4891 Unspecified atrial fibrillation: Secondary | ICD-10-CM | POA: Diagnosis not present

## 2017-04-05 DIAGNOSIS — I6309 Cerebral infarction due to thrombosis of other precerebral artery: Secondary | ICD-10-CM

## 2017-04-05 DIAGNOSIS — E782 Mixed hyperlipidemia: Secondary | ICD-10-CM | POA: Diagnosis not present

## 2017-04-05 NOTE — Progress Notes (Deleted)
Cardiology Office Note:    Date:  04/05/2017   ID:  Daniel Chapman, DOB 01/25/1932, MRN [id number]  PCP:  Abner Greenspan, MD  Cardiologist:  Dr. Meda Coffee  Referring MD: Abner Greenspan, MD   No chief complaint on file. ***  History of Present Illness:    Daniel Chapman is a 83 y.o. male with a past medical history significant for HLD, chronic dyspnea form COPD/ongoing tobacco use, vocal cord cancer, ITP (S/p splenectomy) Atrial fibrillation, and CVA 02/07/1831 newly diagnosed atrial fibrillation.  The patient was admitted to the hospital and 01/2017 with CVA thought to be related to new onset atrial fibrillation.  Echo showed normal LV function and normal LA size.  Patient was initially started on Plavix but then switched to Eliquis 2.5 mg twice a day given age greater than 41 and weight less than 60 kg.  He was placed on statin therapy with Crestor.  He was given 4 weeks of anticoagulation and underwent successful electrical cardioversion on 03/22/17.  He was never placed on rate control medications due to low blood pressure.  Symptoms of his troke included speaking, headache, nausea and vomiting, dizziness and incoordination.  He was seen in follow-up by neurology on 10 31/18.  He continues on Eliquis 2.5 mg twice daily with reduced dose related to his age of greater than 65 for secondary stroke prevention.  Patient was without new neurologic symptoms.  Risk factor modification was stressed including smoking cessation, which the patient has, strict control of blood pressure with goal of<130/90, and control of diabetes with goal of A1c <6.5, most recently A1c was 6.2.   The patient continues to smoke half pack per day and has no intentions of quitting.  He is here today with***  Past Medical History:  Diagnosis Date  . Carcinoma of vocal cord [facility]) 2008   radiation therapy  . Carotid artery occlusion   . COPD (chronic obstructive pulmonary disease) ([place])   . Fall due to ice or snow Feb.  22, 2015   Partial Right Hip replaced  . History of ITP   . Hx of colonic polyps   . Hyperlipemia   . Osteopenia    ?  Marland Kitchen Osteoporosis    dexa 06, declines further dexa or tx   . Smoker     Past Surgical History:  Procedure Laterality Date  . BONE MARROW BIOPSY  05/2003  . CATARACT EXTRACTION    . Dexa     per patient ? osteopenia  . HEMORRHOID SURGERY    . JOINT REPLACEMENT Right Feb. 22, 2015   Partial Hip  . SPLENECTOMY    . TONSILLECTOMY AND ADENOIDECTOMY Bilateral age 77 per patient    Current Medications: No outpatient medications have been marked as taking for the 04/05/17 encounter (Appointment) with Consuelo Pandy, PA-C.     Allergies:   Patient has no known allergies.   Social History   Socioeconomic History  . Marital status: Married    Spouse name: Not on file  . Number of children: 6  . Years of education: Not on file  . Highest education level: Not on file  Social Needs  . Financial resource strain: Not on file  . Food insecurity - worry: Not on file  . Food insecurity - inability: Not on file  . Transportation needs - medical: Not on file  . Transportation needs - non-medical: Not on file  Occupational History  . Occupation: [employer]  Employer: SELF-EMPLOYED  Tobacco Use  . Smoking status: Current Every Day Smoker    Packs/day: 0.50    Types: Cigarettes  . Smokeless tobacco: Never Used  Substance and Sexual Activity  . Alcohol use: No    Alcohol/week: 0.0 oz  . Drug use: No  . Sexual activity: No  Other Topics Concern  . Not on file  Social History Narrative  . Not on file     Family History: The patient's ***family history includes Heart disease in his sister; Uterine cancer in his mother. ROS:   Please see the history of present illness.    *** All other systems reviewed and are negative.  EKGs/Labs/Other Studies Reviewed:    The following studies were reviewed today:  Echocardiogram 02/06/2017 Study  Conclusions  - Left ventricle: The cavity size was normal. Wall thickness was   normal. Systolic function was vigorous. The estimated ejection   fraction was in the range of 65% to 70%. - Aortic valve: Moderately calcified annulus. Mildly thickened   leaflets. - Mitral valve: There was mild regurgitation.   EKG:  EKG is *** ordered today.  The ekg ordered today demonstrates ***  Recent Labs: 02/06/2017: ALT 17; TSH 0.722 03/21/2017: BUN 20; Creatinine, Ser 0.87; Hemoglobin 12.3; Platelets 204; Potassium 4.4; Sodium 142   Recent Lipid Panel    Component Value Date/Time   CHOL 148 02/06/2017 0947   TRIG 116 02/06/2017 0947   HDL 29 (L) 02/06/2017 0947   CHOLHDL 5.1 02/06/2017 0947   VLDL 23 02/06/2017 0947   LDLCALC 96 02/06/2017 0947    Physical Exam:    VS:  There were no vitals taken for this visit.    Wt Readings from Last 3 Encounters:  03/29/17 127 lb 9.6 oz (57.9 kg)  03/21/17 128 lb 8 oz (58.3 kg)  03/03/17 125 lb (56.7 kg)     Physical Exam***   ASSESSMENT:    No diagnosis found. PLAN:    In order of problems listed above:  1. ***   Right ICA chronic occlusion to be followed by [facility] as outpatient. Pt has carotid duplex on schedule for 09/2017  Medication Adjustments/Labs and Tests Ordered: Current medicines are reviewed at length with the patient today.  Concerns regarding medicines are outlined above. Labs and tests ordered and medication changes are outlined in the patient instructions below:  There are no Patient Instructions on file for this visit.   Signed, Daune Perch, NP  04/05/2017 7:54 AM    [facility]

## 2017-04-05 NOTE — Patient Instructions (Signed)
Medication Instructions: Your physician recommends that you continue on your current medications as directed. Please refer to the Current Medication list given to you today.  Labwork: Your physician recommends that you return for lab work: FASTING Lipid and Hepatic function test  Procedures/Testing: None Ordered  Follow-Up: Your physician recommends that you keep your scheduled follow-up appointment on June 29, 2017 with Dr. Johann Capers have been referred to the [facility].  If you need a refill on your cardiac medications before your next appointment, please call your pharmacy.

## 2017-04-10 ENCOUNTER — Other Ambulatory Visit: Payer: Medicare HMO | Admitting: *Deleted

## 2017-04-10 ENCOUNTER — Encounter (HOSPITAL_COMMUNITY): Payer: Self-pay | Admitting: Nurse Practitioner

## 2017-04-10 ENCOUNTER — Ambulatory Visit (HOSPITAL_COMMUNITY)
Admission: RE | Admit: 2017-04-10 | Discharge: 2017-04-10 | Disposition: A | Discharge status: Home / Self Care | Payer: Medicare HMO | Source: Ambulatory Visit | Attending: Nurse Practitioner | Admitting: Nurse Practitioner

## 2017-04-10 VITALS — BP 108/66 | HR 90 | Ht 67.5 in | Wt 126.6 lb

## 2017-04-10 DIAGNOSIS — J449 Chronic obstructive pulmonary disease, unspecified: Secondary | ICD-10-CM | POA: Insufficient documentation

## 2017-04-10 DIAGNOSIS — D693 Immune thrombocytopenic purpura: Secondary | ICD-10-CM | POA: Diagnosis not present

## 2017-04-10 DIAGNOSIS — I6529 Occlusion and stenosis of unspecified carotid artery: Secondary | ICD-10-CM | POA: Diagnosis not present

## 2017-04-10 DIAGNOSIS — Z7901 Long term (current) use of anticoagulants: Secondary | ICD-10-CM | POA: Diagnosis not present

## 2017-04-10 DIAGNOSIS — Z96641 Presence of right artificial hip joint: Secondary | ICD-10-CM | POA: Insufficient documentation

## 2017-04-10 DIAGNOSIS — F17210 Nicotine dependence, cigarettes, uncomplicated: Secondary | ICD-10-CM | POA: Diagnosis not present

## 2017-04-10 DIAGNOSIS — Z79899 Other long term (current) drug therapy: Secondary | ICD-10-CM | POA: Diagnosis not present

## 2017-04-10 DIAGNOSIS — E782 Mixed hyperlipidemia: Secondary | ICD-10-CM | POA: Diagnosis not present

## 2017-04-10 DIAGNOSIS — I481 Persistent atrial fibrillation: Secondary | ICD-10-CM | POA: Insufficient documentation

## 2017-04-10 DIAGNOSIS — Z8673 Personal history of transient ischemic attack (TIA), and cerebral infarction without residual deficits: Secondary | ICD-10-CM | POA: Diagnosis not present

## 2017-04-10 DIAGNOSIS — Z9081 Acquired absence of spleen: Secondary | ICD-10-CM | POA: Diagnosis not present

## 2017-04-10 DIAGNOSIS — E785 Hyperlipidemia, unspecified: Secondary | ICD-10-CM | POA: Diagnosis not present

## 2017-04-10 DIAGNOSIS — I6309 Cerebral infarction due to thrombosis of other precerebral artery: Secondary | ICD-10-CM | POA: Diagnosis not present

## 2017-04-10 DIAGNOSIS — I4819 Other persistent atrial fibrillation: Secondary | ICD-10-CM

## 2017-04-10 DIAGNOSIS — Z8521 Personal history of malignant neoplasm of larynx: Secondary | ICD-10-CM | POA: Diagnosis not present

## 2017-04-10 DIAGNOSIS — R69 Illness, unspecified: Secondary | ICD-10-CM | POA: Diagnosis not present

## 2017-04-10 DIAGNOSIS — I4891 Unspecified atrial fibrillation: Secondary | ICD-10-CM | POA: Diagnosis present

## 2017-04-10 LAB — LIPID PANEL
CHOLESTEROL TOTAL: 108 mg/dL (ref 100–199)
Chol/HDL Ratio: 3.4 ratio (ref 0.0–5.0)
HDL: 32 mg/dL — AB (ref >39)
LDL Calculated: 57 mg/dL (ref 0–99)
TRIGLYCERIDES: 94 mg/dL (ref 0–149)
VLDL CHOLESTEROL CAL: 19 mg/dL (ref 5–40)

## 2017-04-10 LAB — HEPATIC FUNCTION PANEL
ALT: 10 IU/L (ref 0–44)
AST: 18 IU/L (ref 0–40)
Albumin: 4 g/dL (ref 3.5–4.7)
Alkaline Phosphatase: 64 IU/L (ref 39–117)
Bilirubin Total: 0.3 mg/dL (ref 0.0–1.2)
Bilirubin, Direct: 0.11 mg/dL (ref 0.00–0.40)
TOTAL PROTEIN: 7.2 g/dL (ref 6.0–8.5)

## 2017-04-10 NOTE — Progress Notes (Signed)
Primary Care Physician: Tower, [name], MD Referring Physician: Gurman, [place] is a 83 y.o. male with a h/o medical history significant for COPD not on home oxygen, tobacco use, vocal cord cancer, ITP, hyperlipidemia, carotid artery occlusion presented  toemergency department, 9/14, with complaint dizziness/ fall the day before. 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 No intracranial hemorrhage, with minimal local mass effect secondary to edema.    Patient was unable to articulate factly what happened.Marland Kitchen He said that he became dizzy and fell down. He then got himself up to the chair. He did not remember vomiting but wife says when she came home from the grocery store she found him sitting in the chair with emesis on his shirt. In addition she found the house" disarray".  He was started on eliquis 2.5 mg bid and Plavix was stopped.He was set up for cardioversion after 3 weeks of Eliquis, did convert to SR but had ERAF. He is rate controlled. He is being seen in the afib clinic for persistent afib.   He states that he is not symptomatic with his afib.He is unaware that he is in afib. His energy is good and he denies any shortness of breath. He smokes 1/2 pack a day and drinks large amounts caffeine. No alcohol.He is staying rate controlled without  rate control drugs. He is taking eliquis correctly. He is adamant that he does not  want to have another cardioversion nor new meds. The issues he is most disturbed about is balance issues since the stroke. He was recently seen in Neuro but this was not discussed with the provider. The wife states that she thought he was to get Physical therapy in the home but this has not happened. Echo showed good EF and he is not retaining fluid.  Today, he denies symptoms of palpitations, chest pain, shortness of  breath, orthopnea, PND, lower extremity edema, dizziness, presyncope, syncope, or neurologic sequela. The patient is tolerating medications without difficulties and is otherwise without complaint today.   Past Medical History:  Diagnosis Date  . Carcinoma of vocal cord [facility]) 2008   radiation therapy  . Carotid artery occlusion   . COPD (chronic obstructive pulmonary disease) ([place])   . Fall due to ice or snow Feb. 22, 2015   Partial Right Hip replaced  . History of ITP   . Hx of colonic polyps   . Hyperlipemia   . Osteopenia    ?  Marland Kitchen Osteoporosis    dexa 06, declines further dexa or tx   . Smoker    Past Surgical History:  Procedure Laterality Date  . BONE MARROW BIOPSY  05/2003  . CATARACT EXTRACTION    . Dexa     per patient ? osteopenia  . HEMORRHOID SURGERY    . JOINT REPLACEMENT Right Feb. 22, 2015   Partial Hip  . SPLENECTOMY    . TONSILLECTOMY AND ADENOIDECTOMY Bilateral age 57 per patient    Current Outpatient Medications  Medication Sig Dispense Refill  . calcium carbonate (TUMS EX) 750 MG chewable tablet Chew 1 tablet by mouth daily. Reported on 09/15/2015    . cholecalciferol (VITAMIN D) 1000 UNITS tablet Take 1,000 Units by mouth daily. Reported on 09/15/2015    . ELIQUIS 2.5 MG TABS tablet Take 1 tablet (2.5 mg total) by mouth 2 (two) times daily. 60 tablet 3  .  rosuvastatin (CRESTOR) 10 MG tablet Take 1 tablet (10 mg total) by mouth daily at 6 PM. 30 tablet 3   No current facility-administered medications for this encounter.     No Known Allergies  Social History   Socioeconomic History  . Marital status: Married    Spouse name: Not on file  . Number of children: 6  . Years of education: Not on file  . Highest education level: Not on file  Social Needs  . Financial resource strain: Not on file  . Food insecurity - worry: Not on file  . Food insecurity - inability: Not on file  . Transportation needs - medical: Not on file  . Transportation needs -  non-medical: Not on file  Occupational History  . Occupation: Biochemist, clinical: SELF-EMPLOYED  Tobacco Use  . Smoking status: Current Every Day Smoker    Packs/day: 0.50    Types: Cigarettes  . Smokeless tobacco: Never Used  Substance and Sexual Activity  . Alcohol use: No    Alcohol/week: 0.0 oz  . Drug use: No  . Sexual activity: No  Other Topics Concern  . Not on file  Social History Narrative  . Not on file    Family History  Problem Relation Age of Onset  . Uterine cancer Mother   . Heart disease Sister        some heart trouble    ROS- All systems are reviewed and negative except as per the HPI above  Physical Exam: Vitals:   04/10/17 0834  BP: 108/66  Pulse: 90  Weight: 126 lb 9.6 oz (57.4 kg)  Height: 5' 7.5" (1.715 m)   Wt Readings from Last 3 Encounters:  04/10/17 126 lb 9.6 oz (57.4 kg)  04/05/17 127 lb 1.9 oz (57.7 kg)  03/29/17 127 lb 9.6 oz (57.9 kg)    Labs: Lab Results  Component Value Date   NA 142 03/21/2017   K 4.4 03/21/2017   CL 106 03/21/2017   CO2 28 03/21/2017   GLUCOSE 104 (H) 03/21/2017   BUN 20 03/21/2017   CREATININE 0.87 03/21/2017   CALCIUM 9.7 03/21/2017   PHOS 3.2 05/12/2010   Lab Results  Component Value Date   INR 1.03 02/06/2017   Lab Results  Component Value Date   CHOL 148 02/06/2017   HDL 29 (L) 02/06/2017   LDLCALC 96 02/06/2017   TRIG 116 02/06/2017     GEN- The patient is well appearing, alert and oriented x 3 today.   Head- normocephalic, atraumatic Eyes-  Sclera clear, conjunctiva pink Ears- hearing intact Oropharynx- clear Neck- supple, no JVP Lymph- no cervical lymphadenopathy Lungs- Clear to ausculation bilaterally, normal work of breathing Heart-irregular rate and rhythm, no murmurs, rubs or gallops, PMI not laterally displaced GI- soft, NT, ND, + BS Extremities- no clubbing, cyanosis, or edema MS- no significant deformity or atrophy Skin- no rash or lesion Psych- euthymic mood,  full affect Neuro- strength and sensation are intact  EKG-NSR at 99 bpm, pr int 152 ms, qrs int 80 ms, qtc 451 ms Epic records reviewed Echo-Study Conclusions  - Left ventricle: The cavity size was normal. Wall thickness was   normal. Systolic function was vigorous. The estimated ejection   fraction was in the range of 65% to 70%. - Aortic valve: Moderately calcified annulus. Mildly thickened   leaflets. - Mitral valve: There was mild regurgitation.     Assessment and Plan: 1. New onset afib 01/2017 associated with presentation  of CVA Successful cardioversion 10/24 but early return afib On eliquis 2.5 mg bid for a chadsvasc score of at least 5 Currently he is rate controlled and not on rate control meds He states that he is not symptomatic and he is clear that he does not want another cardioversion or drugs to restore SR as he does not feel bad So will continue with rate control strategy  for now  2. CVA He is concerned re balance issues Will message Neuro to see if anything else, ie PT, will be helpful.  F/u with Dr. Meda Coffee as planned in January Have asked wife to let me know if his HR does not stay controlled, if this happens may need rate control added, she states when checked always below 100 bpm.  Geroge Baseman. Emary Zalar, [facility] [address] [place], [place] [address] [phone]

## 2017-04-11 NOTE — Progress Notes (Signed)
Cardiology Office Note:    Date:  04/05/2017  ID:  [name], DOB Jul 18, 1931, MRN [id number]  PCP:  Abner Greenspan, MD  Cardiologist:  Dr. Meda Coffee  Referring MD: Abner Greenspan, MD   Chief complaint: Follow up atrial fib  History of Present Illness:    Daniel Chapman is a 83 y.o. male with a past medical history significant for HLD, chronic dyspnea form COPD/ongoing tobacco use, vocal cord cancer, ITP (S/p splenectomy),  Atrial fibrillation, and CVA 02/07/1831 newly diagnosed atrial fibrillation.  The patient was admitted to the hospital and 01/2017 with CVA thought to be related to new onset atrial fibrillation.  Echo showed normal LV function and normal LA size.  Patient was initially started on Plavix but then switched to Eliquis 2.5 mg twice a day given age greater than 51 and weight less than 60 kg.  He was placed on statin therapy with Crestor.  He was given 4 weeks of anticoagulation and underwent successful electrical cardioversion on 03/22/17.  He was never placed on rate control medications due to low blood pressure.  He was seen in follow-up by neurology on 10 31/18.  He continues on Eliquis 2.5 mg twice daily with reduced dose related to his age of greater than 66 for secondary stroke prevention.  Patient was without new neurologic symptoms.  Risk factor modification was stressed including smoking cessation, which the patient has, strict control of blood pressure with goal of<130/90, and control of diabetes with goal of A1c <6.5, most recently A1c was 6.2.   The patient continues to smoke half pack per day and has no intentions of quitting.  He is here today with his wife of 9 years.   Past Medical History:  Diagnosis Date  . Carcinoma of vocal cord [facility]) 2008   radiation therapy  . Carotid artery occlusion   . COPD (chronic obstructive pulmonary disease) ([place])   . Fall due to ice or snow Feb. 22, 2015   Partial Right Hip replaced  . History of ITP   . Hx of colonic  polyps   . Hyperlipemia   . Osteopenia    ?  Marland Kitchen Osteoporosis    dexa 06, declines further dexa or tx   . Smoker     Past Surgical History:  Procedure Laterality Date  . BONE MARROW BIOPSY  05/2003  . CATARACT EXTRACTION    . Dexa     per patient ? osteopenia  . HEMORRHOID SURGERY    . JOINT REPLACEMENT Right Feb. 22, 2015   Partial Hip  . SPLENECTOMY    . TONSILLECTOMY AND ADENOIDECTOMY Bilateral age 74 per patient    Current Medications: Current Meds  Medication Sig  . calcium carbonate (TUMS EX) 750 MG chewable tablet Chew 1 tablet by mouth daily. Reported on 09/15/2015  . cholecalciferol (VITAMIN D) 1000 UNITS tablet Take 1,000 Units by mouth daily. Reported on 09/15/2015  . ELIQUIS 2.5 MG TABS tablet Take 1 tablet (2.5 mg total) by mouth 2 (two) times daily.  . rosuvastatin (CRESTOR) 10 MG tablet Take 1 tablet (10 mg total) by mouth daily at 6 PM.     Allergies:   Patient has no known allergies.   Social History   Socioeconomic History  . Marital status: Married    Spouse name: None  . Number of children: 6  . Years of education: None  . Highest education level: None  Social Needs  . Financial resource strain: None  .  Food insecurity - worry: None  . Food insecurity - inability: None  . Transportation needs - medical: None  . Transportation needs - non-medical: None  Occupational History  . Occupation: Biochemist, clinical: SELF-EMPLOYED  Tobacco Use  . Smoking status: Current Every Day Smoker    Packs/day: 0.50    Types: Cigarettes  . Smokeless tobacco: Never Used  Substance and Sexual Activity  . Alcohol use: No    Alcohol/week: 0.0 oz  . Drug use: No  . Sexual activity: No  Other Topics Concern  . None  Social History Narrative  . None     Family History: The patient's family history includes Heart disease in his sister; Uterine cancer in his mother. ROS:   Please see the history of present illness.     All other systems reviewed and are  negative.  EKGs/Labs/Other Studies Reviewed:    The following studies were reviewed today: Echocardiogram 02/06/2017 Study Conclusions - Left ventricle: The cavity size was normal. Wall thickness was   normal. Systolic function was vigorous. The estimated ejection   fraction was in the range of 65% to 70%. - Aortic valve: Moderately calcified annulus. Mildly thickened   leaflets. - Mitral valve: There was mild regurgitation. Normal left atrium  EKG:  EKG is ordered today.  The ekg ordered today demonstrates atrial fibrillation at 81 bpm.  Recent Labs: 02/06/2017: TSH 0.722 03/21/2017: BUN 20; Creatinine, Ser 0.87; Hemoglobin 12.3; Platelets 204; Potassium 4.4; Sodium 142 04/10/2017: ALT 10   Recent Lipid Panel    Component Value Date/Time   CHOL 108 04/10/2017 0811   TRIG 94 04/10/2017 0811   HDL 32 (L) 04/10/2017 0811   CHOLHDL 3.4 04/10/2017 0811   CHOLHDL 5.1 02/06/2017 0947   VLDL 23 02/06/2017 0947   LDLCALC 57 04/10/2017 0811    Physical Exam:    VS:  BP 106/74   Pulse 81   Ht 5' 7.5" (1.715 m)   Wt 127 lb 1.9 oz (57.7 kg)   SpO2 98%   BMI 19.62 kg/m     Wt Readings from Last 3 Encounters:  04/10/17 126 lb 9.6 oz (57.4 kg)  04/05/17 127 lb 1.9 oz (57.7 kg)  03/29/17 127 lb 9.6 oz (57.9 kg)     Physical ExaM: Constitutional: He is oriented to person, place, and time. He appears well-developed. No distress.  Thin elderly male.    HENT:   Head: Normocephalic and atraumatic.  Poor dentition with many missing teeth.   Neck: Normal range of motion. Neck supple. No JVD present. No thyromegaly present.   Cardiovascular: An irregularly irregular rhythm present. Exam reveals no gallop and no friction rub.   No murmur heard.  Pulmonary/Chest: Effort normal.  Clear with good air exchanges except for Faint end expiratory wheezes   Abdominal: Soft. Bowel sounds are normal. He exhibits no distension. There is no tenderness.  Musculoskeletal: Normal range of  motion. He exhibits no edema.  Neurological: He is alert and oriented to person, place, and time.   Skin: Skin is warm and dry.  Psychiatric: He has a normal mood and affect. His behavior is normal.  Pt is loud and jokes alot      ASSESSMENT:    1. Atrial fibrillation, new onset ([place])   2. Mixed hyperlipidemia   3. Cerebrovascular accident (CVA) due to thrombosis of other precerebral artery ([place])    PLAN:    In order of problems listed above:  1.  Atrial  fibrillation: Patient found to be in atrial fibrillation when he presented with a stroke in September.  He was placed on Eliquis 2.5 mg twice daily, on reduced dose due to age and weight.  He has not been on any rate control medications due to soft blood pressures.  He had a successful electrocardioversion on 10/24 with easy conversion to sinus rhythm, however he presents today in atrial fibrillation with controlled rate.  He seems to be asymptomatic.  I have discussed the case with Dr. Radford Pax, DOD and we have decided to refer the patient to the A. fib clinic to discuss whether to pursue a rate control strategy, non-edicated, or add an antiarrhythmic.  He may do well with amiodarone and could possibly use a low dose.  This is been discussed with the patient and his wife and they are in agreement.  I have discussed signs and symptoms of his being intolerant to the A. fib for the patient to watch for including shortness of breath, fatigue, dizziness, activity intolerance and that he should report these right away.  2.  Hyperlipidemia: In the setting of stroke the patient was started on rosuvastatin.  He is having no adverse effects.  His LDL was 96 on 02/06/17 at the initiation of statin.  Will check LFTs and lipids when he is fasting.    3. Right ICA chronic occlusion to be followed by [facility] as outpatient: Pt has carotid duplex on schedule for 09/2017   Medication Adjustments/Labs and Tests Ordered: Current medicines are reviewed at length  with the patient today.  Concerns regarding medicines are outlined above. Labs and tests ordered and medication changes are outlined in the patient instructions below:  Patient Instructions  Medication Instructions: Your physician recommends that you continue on your current medications as directed. Please refer to the Current Medication list given to you today.  Labwork: Your physician recommends that you return for lab work: FASTING Lipid and Hepatic function test  Procedures/Testing: None Ordered  Follow-Up: Your physician recommends that you keep your scheduled follow-up appointment on June 29, 2017 with Dr. Johann Capers have been referred to the [facility].  If you need a refill on your cardiac medications before your next appointment, please call your pharmacy.     (The day this note was completed the server shut down and this note could not be access except in read only. Note was reentered at a later date)  Signed, Daune Perch, NP  04/11/2017 2:03 PM    [place]

## 2017-04-18 ENCOUNTER — Telehealth: Payer: Self-pay | Admitting: Nurse Practitioner

## 2017-04-18 NOTE — Telephone Encounter (Signed)
Received message from cardiology Ceasar Lund the patient complained of balance issues on visit to their office and was thought he was to get physical therapy after hospitalization.  According to discharge note from the hospital he was only to get speech therapy.  We can order some physical therapy short-term for balance issues if patient desires. If so, place order for PT evaluation and treat  OP would be better than [facility]Thanks

## 2017-04-18 NOTE — Telephone Encounter (Signed)
I spoke to pt and asked if he would like be referred to outpt PT for his balance issues.  He stated that he would like to hold off for now.  I relayed that if he changes his mind to give [place] a call back.  He verbalized understanding.

## 2017-04-24 ENCOUNTER — Other Ambulatory Visit: Payer: Self-pay | Admitting: Family Medicine

## 2017-06-16 ENCOUNTER — Other Ambulatory Visit: Payer: Self-pay | Admitting: Physician Assistant

## 2017-06-29 ENCOUNTER — Ambulatory Visit: Payer: Medicare HMO | Admitting: Cardiology

## 2017-06-29 VITALS — BP 108/68 | HR 85 | Resp 16 | Wt 130.4 lb

## 2017-06-29 DIAGNOSIS — E782 Mixed hyperlipidemia: Secondary | ICD-10-CM | POA: Diagnosis not present

## 2017-06-29 DIAGNOSIS — I482 Chronic atrial fibrillation, unspecified: Secondary | ICD-10-CM

## 2017-06-29 DIAGNOSIS — Z7901 Long term (current) use of anticoagulants: Secondary | ICD-10-CM

## 2017-06-29 DIAGNOSIS — I6309 Cerebral infarction due to thrombosis of other precerebral artery: Secondary | ICD-10-CM | POA: Diagnosis not present

## 2017-06-29 MED ORDER — ROSUVASTATIN CALCIUM 5 MG PO TABS: 5.0000 mg | ORAL_TABLET | ORAL | 2 refills | Status: DC

## 2017-06-29 MED ORDER — ROSUVASTATIN CALCIUM 10 MG PO TABS: 10.0000 mg | ORAL_TABLET | ORAL | 2 refills | Status: DC

## 2017-06-29 NOTE — Progress Notes (Signed)
Cardiology Office Note:    Date:  04/05/2017  ID:  Daniel Chapman, DOB February 19, 1932, MRN [id number]  PCP:  Abner Greenspan, MD  Cardiologist:  Dr. Meda Coffee  Referring MD: Abner Greenspan, MD   Chief complaint: Follow up atrial fib  History of Present Illness:    Daniel Chapman is a 84 y.o. male with a past medical history significant for HLD, chronic dyspnea form COPD/ongoing tobacco use, vocal cord cancer, ITP (S/p splenectomy),  Atrial fibrillation, and CVA 02/07/1831 newly diagnosed atrial fibrillation. The patient was admitted to the hospital and 01/2017 with CVA thought to be related to new onset atrial fibrillation.  Echo showed normal LV function and normal LA size.  Patient was initially started on Plavix but then switched to Eliquis 2.5 mg twice a day given age greater than 22 and weight less than 60 kg.  He was placed on statin therapy with Crestor.  He was given 4 weeks of anticoagulation and underwent successful electrical cardioversion on 03/22/17.  He was never placed on rate control medications due to low blood pressure. He was seen in follow-up by neurology on 10 31/18.  He continues on Eliquis 2.5 mg twice daily with reduced dose related to his age of greater than 54 for secondary stroke prevention.  Patient was without new neurologic symptoms.  Risk factor modification was stressed including smoking cessation, which the patient has, strict control of blood pressure with goal of<130/90, and control of diabetes with goal of A1c <6.5, most recently A1c was 6.2.  The patient continues to smoke half pack per day and has no intentions of quitting. He is here today with his wife of 80 years.   06/29/2017 - the patient is coming after 3 months,he was seen by a fib clinic, and it was decided that we will continue with rate control strategy only. He denies any bleeding, he has no desire to have another cardioversion. He is complaining of mild dizziness but no falls.his father is concerned about  personality changes and some memory impairment.  Past Medical History:  Diagnosis Date  . Carcinoma of vocal cord [facility]) 2008   radiation therapy  . Carotid artery occlusion   . COPD (chronic obstructive pulmonary disease) ([place])   . Fall due to ice or snow Feb. 22, 2015   Partial Right Hip replaced  . History of ITP   . Hx of colonic polyps   . Hyperlipemia   . Osteopenia    ?  Marland Kitchen Osteoporosis    dexa 06, declines further dexa or tx   . Smoker     Past Surgical History:  Procedure Laterality Date  . BONE MARROW BIOPSY  05/2003  . CARDIOVERSION N/A 03/22/2017   Procedure: CARDIOVERSION;  Surgeon: Acie Fredrickson Wonda Cheng, MD;  Location: [place];  Service: Cardiovascular;  Laterality: N/A;  . CATARACT EXTRACTION    . Dexa     per patient ? osteopenia  . HEMORRHOID SURGERY    . JOINT REPLACEMENT Right Feb. 22, 2015   Partial Hip  . SPLENECTOMY    . TONSILLECTOMY AND ADENOIDECTOMY Bilateral age 84 per patient    Current Medications: Current Meds  Medication Sig  . calcium carbonate (TUMS EX) 750 MG chewable tablet Chew 1 tablet by mouth daily. Reported on 09/15/2015  . cholecalciferol (VITAMIN D) 1000 UNITS tablet Take 1,000 Units by mouth daily. Reported on 09/15/2015  . ELIQUIS 2.5 MG TABS tablet Take 1 tablet (2.5 mg total) by mouth 2 (two)  times daily.  . sertraline (ZOLOFT) 100 MG tablet TAKE 1/2 TABLET DAILY FOR  DEPRESSION  . [DISCONTINUED] rosuvastatin (CRESTOR) 10 MG tablet TAKE 1 TABLET (10 MG TOTAL) BY MOUTH DAILY AT 6 PM.     Allergies:   Patient has no known allergies.   Social History   Socioeconomic History  . Marital status: Married    Spouse name: Not on file  . Number of children: 6  . Years of education: Not on file  . Highest education level: Not on file  Social Needs  . Financial resource strain: Not on file  . Food insecurity - worry: Not on file  . Food insecurity - inability: Not on file  . Transportation needs - medical: Not on file  .  Transportation needs - non-medical: Not on file  Occupational History  . Occupation: Biochemist, clinical: SELF-EMPLOYED  Tobacco Use  . Smoking status: Current Every Day Smoker    Packs/day: 0.50    Types: Cigarettes  . Smokeless tobacco: Never Used  Substance and Sexual Activity  . Alcohol use: No    Alcohol/week: 0.0 oz  . Drug use: No  . Sexual activity: No  Other Topics Concern  . Not on file  Social History Narrative  . Not on file     Family History: The patient's family history includes Chapman disease in his sister; Uterine cancer in his mother. ROS:   Please see the history of present illness.     All other systems reviewed and are negative.  EKGs/Labs/Other Studies Reviewed:    The following studies were reviewed today: Echocardiogram 02/06/2017 Study Conclusions - Left ventricle: The cavity size was normal. Wall thickness was   normal. Systolic function was vigorous. The estimated ejection   fraction was in the range of 65% to 70%. - Aortic valve: Moderately calcified annulus. Mildly thickened   leaflets. - Mitral valve: There was mild regurgitation. Normal left atrium  EKG:  EKG is ordered today.  The ekg ordered today demonstrates atrial fibrillation at 81 bpm.  Recent Labs: 02/06/2017: TSH 0.722 03/21/2017: BUN 20; Creatinine, Ser 0.87; Hemoglobin 12.3; Platelets 204; Potassium 4.4; Sodium 142 04/10/2017: ALT 10   Recent Lipid Panel    Component Value Date/Time   CHOL 108 04/10/2017 0811   TRIG 94 04/10/2017 0811   HDL 32 (L) 04/10/2017 0811   CHOLHDL 3.4 04/10/2017 0811   CHOLHDL 5.1 02/06/2017 0947   VLDL 23 02/06/2017 0947   LDLCALC 57 04/10/2017 0811    Physical Exam:    VS:  BP 108/68   Pulse 85   Resp 16   Wt 130 lb 6.4 oz (59.1 kg)   SpO2 97%   BMI 20.12 kg/m     Wt Readings from Last 3 Encounters:  06/29/17 130 lb 6.4 oz (59.1 kg)  04/10/17 126 lb 9.6 oz (57.4 kg)  04/05/17 127 lb 1.9 oz (57.7 kg)     Physical  ExaM: Constitutional: He is oriented to person, place, and time. He appears well-developed. No distress.  Thin elderly male.    HENT:   Head: Normocephalic and atraumatic.  Poor dentition with many missing teeth.   Neck: Normal range of motion. Neck supple. No JVD present. No thyromegaly present.   Cardiovascular: An irregularly irregular rhythm present. Exam reveals no gallop and no friction rub.   No murmur heard.  Pulmonary/Chest: Effort normal.  Clear with good air exchanges except for Faint end expiratory wheezes   Abdominal: Soft.  Bowel sounds are normal. He exhibits no distension. There is no tenderness.  Musculoskeletal: Normal range of motion. He exhibits no edema.  Neurological: He is alert and oriented to person, place, and time.   Skin: Skin is warm and dry.  Psychiatric: He has a normal mood and affect. His behavior is normal.  Pt is loud and jokes alot   EKG performed today 06/29/2017 shows atrial fibrillation Q waves in anterior leads, unchanged from prior.   ASSESSMENT:    1. Chronic a-fib ([place])   2. Mixed hyperlipidemia   3. Chronic anticoagulation   4. Cerebrovascular accident (CVA) due to thrombosis of other precerebral artery ([place])    PLAN:    In order of problems listed above:  1.  Atrial fibrillation: Patient found to be in atrial fibrillation when he presented with a stroke in September.  He was placed on Eliquis 2.5 mg twice daily, on reduced dose due to age and weight.  He has not been on any rate control medications due to soft blood pressures.  He had a successful electrocardioversion on 10/24 with easy conversion to sinus rhythm, however went back to atrial fibrillation with controlled rate.  It was decided to continue with rate controlled only, he has no bleeding with [facility] list, will continue.  2.  Hyperlipidemia: In the setting of stroke the patient was started on rosuvastatin.  Lipids were all at [facility] November 2018, patient's wife is  concerned about some of the memory problems and personality issues, will decrease to 5 mg 3 times a week.  3. Right ICA chronic occlusion to be followed by [facility] as outpatient: Pt has carotid duplex on schedule for 09/2017.  4. Dizziness, the patient is advised to increase his fluid intake.  Medication Adjustments/Labs and Tests Ordered: Current medicines are reviewed at length with the patient today.  Concerns regarding medicines are outlined above. Labs and tests ordered and medication changes are outlined in the patient instructions below:  Patient Instructions  Medication Instructions:   DECREASE YOUR ROSUVASTATIN TO 10 MG THREE TIMES WEEKLY--TAKE ON Monday, Wednesday, AND FRIDAYS.    Follow-Up:  Your physician wants you to follow-up in: [place] will receive a reminder letter in the mail two months in advance. If you don't receive a letter, please call our office to schedule the follow-up appointment.        If you need a refill on your cardiac medications before your next appointment, please call your pharmacy.     (The day this note was completed the server shut down and this note could not be access except in read only. Note was reentered at a later date)  Signed, Ena Dawley, MD  06/29/2017 10:52 AM    [place]

## 2017-06-29 NOTE — Patient Instructions (Addendum)
Medication Instructions:   DECREASE YOUR ROSUVASTATIN TO 5 MG THREE TIMES WEEKLY--TAKE ON Monday, Wednesday, AND FRIDAYS.    Follow-Up:  Your physician wants you to follow-up in: [place] will receive a reminder letter in the mail two months in advance. If you don't receive a letter, please call our office to schedule the follow-up appointment.        If you need a refill on your cardiac medications before your next appointment, please call your pharmacy.

## 2017-06-30 NOTE — Addendum Note (Signed)
Addended by: Valere Dross on: 06/30/2017 08:57 AM   Modules accepted: Orders

## 2017-07-04 DIAGNOSIS — R1314 Dysphagia, pharyngoesophageal phase: Secondary | ICD-10-CM | POA: Diagnosis not present

## 2017-07-04 DIAGNOSIS — R490 Dysphonia: Secondary | ICD-10-CM | POA: Diagnosis not present

## 2017-07-14 ENCOUNTER — Other Ambulatory Visit: Payer: Self-pay | Admitting: Physician Assistant

## 2017-09-26 NOTE — Progress Notes (Signed)
[facility]  PATIENT: Daniel Chapman DOB: June 08, 1931   REASON FOR VISIT: follow-up for stroke Sept 2018 HISTORY FROM: Patient and wife    HISTORY OF PRESENT ILLNESS:HISTORY OF PRESENT ILLNESS (per record) Daniel Chapman an 84 y.o.male with a history of newly-diagnosed atrial fibrillation, vocal cord cancer, tobacco abuse (current smoker), COPD, ITP (s/p splenectomy), and hyperlipidemia, who presented to [facility] 02/06/2017 for evaluation of new onset dizziness and incoordination. Symptoms also included difficulty speaking, headache, nausea and vomiting. The patient was knocking things onto the floor as a result of the incoordination. Per ED triage note:"His wife who came home yesterday afternoon and found the pt laying on the floor beside vomit and a coffee pot he knocked down. Initially pt was unable to speak, but eventually he was able to state that he was hit with "a bout of dizziness"."He has vomited three times since Sunday.  CT head in the ED revealed a large right inferior cerebellar hemisphere ischemic infarction. Increased density overlapping the vertebrobasilar junction was felt by Radiology to possibly represent thrombosis and a CTA was recommended.   EKG in the ED reveals atrial fibrillation. He has no prior history of such.  Patient was not administered IV t-PA secondary to arriving outside of the treatment window. He was admitted to General Neurology for further evaluation and treatment. SUBJECTIVE (INTERVAL HISTORY) No family is in the room.  The patient is awake, alert, and follows all commands appropriately. No complains. No acute event overnight. EKG showed afib. Pt denies any palpitation.  UPDATE 03/29/2017 CM Daniel Chapman, 84 year old male returns for hospital follow-up for right cerebellar PICA infarct, no hemorrhage. MRA of the head right ICA chronic occlusion. 2-D echo EF 60-65% no source of embolus. LDL 96. Hemoglobin A1c 6.2. Newly diagnosed with  atrial fibrillation and on Eliquis 2.5 mg twice daily due to a greater than 80. He has not had recurrent stroke or TIA symptoms. He has minimal bruising and no bleeding. Is also on Crestor for hyperlipidemia without myalgias. Blood pressure in the office today 116/74. He had cardioversion on 03/22/2017 by Dr.Nahser for his atrial fibrillation. Patient continues to smoke half pack a day and has no intention of quitting. He returns for reevaluation UPDATE 5/1/2019CM Daniel Chapman, 84 year old male returns for follow-up with his wife with history of right cerebellar PICA infarct in September 2018, no hemorrhage MRA of the head right ICA chronic occlusion.  Newly diagnosed atrial fibrillation and on Eliquis.  He denies further stroke or TIA symptoms.  He has minimal bruising and no bleeding.  He remains on Crestor for hyperlipidemia without myalgias.  Patient continues to smoke with no intention of quitting.  Blood pressure in the office today 114/73.  His carotid Dopplers followed by vascular surgery.  He has an appointment next week.  He continues to complain of some balance issues since his stroke however he has not had any falls and he does not use an assistive device.  Physical therapy was offered to him in November of last year however he declined.  He performs all activities of daily living.  He returns for reevaluation  REVIEW OF SYSTEMS: Full 14 system review of systems performed and notable only for those listed, all others are neg:  Constitutional: neg  Cardiovascular: neg Ear/Nose/Throat: Hearing loss Skin: neg Eyes: neg Respiratory: COPD Gastroitestinal: neg  Genitourinary urinary frequency Hematology/Lymphatic:  easy bruising   Endocrine: Intolerance to cold Musculoskeletal:neg Allergy/Immunology: neg Neurological: dizziness Psychiatric: neg Sleep : neg  ALLERGIES: No Known Allergies  HOME MEDICATIONS: Outpatient Medications Prior to Visit  Medication Sig Dispense Refill  . calcium  carbonate (TUMS EX) 750 MG chewable tablet Chew 1 tablet by mouth daily. Reported on 09/15/2015    . cholecalciferol (VITAMIN D) 1000 UNITS tablet Take 1,000 Units by mouth daily. Reported on 09/15/2015    . ELIQUIS 2.5 MG TABS tablet TAKE 1 TABLET BY MOUTH TWICE A DAY 60 tablet 5  . rosuvastatin (CRESTOR) 5 MG tablet Take 1 tablet (5 mg total) by mouth 3 (three) times a week. Take on Monday, Wednesday, and Fridays. 45 tablet 2  . sertraline (ZOLOFT) 100 MG tablet TAKE 1/2 TABLET DAILY FOR  DEPRESSION (Patient not taking: Reported on 09/27/2017) 45 tablet 1   No facility-administered medications prior to visit.     PAST MEDICAL HISTORY: Past Medical History:  Diagnosis Date  . Carcinoma of vocal cord [facility]) 2008   radiation therapy  . Carotid artery occlusion   . COPD (chronic obstructive pulmonary disease) ([place])   . Fall due to ice or snow Feb. 22, 2015   Partial Right Hip replaced  . History of ITP   . Hx of colonic polyps   . Hyperlipemia   . Osteopenia    ?  Marland Kitchen Osteoporosis    dexa 06, declines further dexa or tx   . Smoker     PAST SURGICAL HISTORY: Past Surgical History:  Procedure Laterality Date  . BONE MARROW BIOPSY  05/2003  . CARDIOVERSION N/A 03/22/2017   Procedure: CARDIOVERSION;  Surgeon: Acie Fredrickson Wonda Cheng, MD;  Location: [place];  Service: Cardiovascular;  Laterality: N/A;  . CATARACT EXTRACTION    . Dexa     per patient ? osteopenia  . HEMORRHOID SURGERY    . JOINT REPLACEMENT Right Feb. 22, 2015   Partial Hip  . SPLENECTOMY    . TONSILLECTOMY AND ADENOIDECTOMY Bilateral age 54 per patient    FAMILY HISTORY: Family History  Problem Relation Age of Onset  . Uterine cancer Mother   . Heart disease Sister        some heart trouble    SOCIAL HISTORY: Social History   Socioeconomic History  . Marital status: Married    Spouse name: Not on file  . Number of children: 6  . Years of education: Not on file  . Highest education level: Not on file    Occupational History  . Occupation: Biochemist, clinical: SELF-EMPLOYED  Social Needs  . Financial resource strain: Not on file  . Food insecurity:    Worry: Not on file    Inability: Not on file  . Transportation needs:    Medical: Not on file    Non-medical: Not on file  Tobacco Use  . Smoking status: Current Every Day Smoker    Packs/day: 0.50    Types: Cigarettes  . Smokeless tobacco: Never Used  Substance and Sexual Activity  . Alcohol use: No    Alcohol/week: 0.0 oz  . Drug use: No  . Sexual activity: Never  Lifestyle  . Physical activity:    Days per week: Not on file    Minutes per session: Not on file  . Stress: Not on file  Relationships  . Social connections:    Talks on phone: Not on file    Gets together: Not on file    Attends religious service: Not on file    Active member of club or organization: Not on file  Attends meetings of clubs or organizations: Not on file    Relationship status: Not on file  . Intimate partner violence:    Fear of current or ex partner: Not on file    Emotionally abused: Not on file    Physically abused: Not on file    Forced sexual activity: Not on file  Other Topics Concern  . Not on file  Social History Narrative  . Not on file     PHYSICAL EXAM  Vitals:   09/27/17 0917  BP: 114/73  Pulse: 91  Weight: 123 lb 9.6 oz (56.1 kg)  Height: 5' 7.5" (1.715 m)   Body mass index is 19.07 kg/m.  Generalized: Well developed, in no acute distress teeth in poor repair Head: normocephalic and atraumatic,. Oropharynx benign  Neck: Supple, no carotid bruits  Cardiac: Regular rate rhythm, no murmur  Musculoskeletal: No deformity   Neurological examination   Mentation: Alert oriented to time, place, history taking. Fund of knowledge was assessed and impaired   Follows all commands speech and language fluent.  Patient is loud and jokes a lot  Cranial nerve II-XII: Pupils were equal round reactive to light extraocular  movements were full, visual field were full on confrontational test. Facial sensation and strength were normal. HOH Uvula tongue midline. head turning and shoulder shrug were normal and symmetric.Tongue protrusion into cheek strength was normal. Motor: normal bulk and tone, full strength in the BUE, BLE, fine finger movements normal, no pronator drift. Sensory: normal and symmetric to light touch,  Coordination: finger-nose-finger, heel-to-shin bilaterally, no dysmetria Reflexes: 1+ upper lower and symmetric, plantar responses were flexor bilaterally. Gait and Station: Rising up from seated position without assistance, normal stance,  moderate stride, good arm swing, smooth turning, able to perform tiptoe, and heel walking without difficulty. Tandem gait is unsteady, no assistive device  DIAGNOSTIC DATA (LABS, IMAGING, TESTING) - I reviewed patient records, labs, notes, testing and imaging myself where available.  Lab Results  Component Value Date   WBC 7.9 03/21/2017   HGB 12.3 (L) 03/21/2017   HCT 35.3 (L) 03/21/2017   MCV 87 03/21/2017   PLT 204 03/21/2017      Component Value Date/Time   NA 142 03/21/2017 1058   K 4.4 03/21/2017 1058   CL 106 03/21/2017 1058   CO2 28 03/21/2017 1058   GLUCOSE 104 (H) 03/21/2017 1058   GLUCOSE 92 02/24/2017 1306   BUN 20 03/21/2017 1058   CREATININE 0.87 03/21/2017 1058   CALCIUM 9.7 03/21/2017 1058   PROT 7.2 04/10/2017 0811   ALBUMIN 4.0 04/10/2017 0811   AST 18 04/10/2017 0811   ALT 10 04/10/2017 0811   ALKPHOS 64 04/10/2017 0811   BILITOT 0.3 04/10/2017 0811   GFRNONAA 80 03/21/2017 1058   GFRAA 92 03/21/2017 1058   Lab Results  Component Value Date   CHOL 108 04/10/2017   HDL 32 (L) 04/10/2017   LDLCALC 57 04/10/2017   TRIG 94 04/10/2017   CHOLHDL 3.4 04/10/2017   Lab Results  Component Value Date   HGBA1C 6.2 (H) 02/06/2017    Lab Results  Component Value Date   TSH 0.722 02/06/2017      ASSESSMENT AND PLAN  84  y.o. year old male  with history presenting with newly diagnosed atrial fibrillation vocal cord cancer tobacco abuse COPD hyperlipidemia who presented to the emergency room for evaluation of new onset dizziness and incoordination symptoms also included difficulty speaking headache nausea and vomiting.MRI brain right cerebellar  PICA infarct, no hemorrhage. MRA of the head right ICA chronic occlusion. 2-D echo EF 60-65% no source of embolus. LDL 96. Hemoglobin A1c 6.2. Had cardioversion in 10/24/ 2018 for atrial fibrillation.  Stressed the importance of management of risk factors to prevent further stroke Continue Eliquis for secondary stroke prevention Maintain strict control of hypertension with blood pressure goal below 130/90, today's reading114/73  Stay well-hydrated Control of diabetes with hemoglobin A1c below 6.5 followed by primary care  Cholesterol with LDL cholesterol less than 70, followed by primary care,   continue crestor Exercise by walking,   eat healthy diet with whole grains,  fresh fruits and vegetables Right ICA chronic occlusion to be followed with [facility] as outpt. Has appt next week Patient continues to smoke and has no intention of quitting. Discharge from stroke clinic Discussed risk for recurrent stroke/ TIA and answered additional questions I spent 20 minutes in total face to face time with the patient more than 50% of which was spent counseling and coordination of care, reviewing test results reviewing medications and discussing and reviewing the diagnosis of stroke and management of risk factors and importance of continued follow-up with primary care for stroke risk factor modification.patient was given additional written information on stroke prevention  Dennie Bible, [facility], [facility], [facility] [address] [place], [place] [address] [phone]

## 2017-09-27 ENCOUNTER — Encounter: Payer: Self-pay | Admitting: Nurse Practitioner

## 2017-09-27 ENCOUNTER — Ambulatory Visit: Payer: Medicare HMO | Admitting: Nurse Practitioner

## 2017-09-27 VITALS — BP 114/73 | HR 91 | Ht 67.5 in | Wt 123.6 lb

## 2017-09-27 DIAGNOSIS — I6522 Occlusion and stenosis of left carotid artery: Secondary | ICD-10-CM

## 2017-09-27 DIAGNOSIS — F17200 Nicotine dependence, unspecified, uncomplicated: Secondary | ICD-10-CM | POA: Diagnosis not present

## 2017-09-27 DIAGNOSIS — E782 Mixed hyperlipidemia: Secondary | ICD-10-CM

## 2017-09-27 DIAGNOSIS — I6309 Cerebral infarction due to thrombosis of other precerebral artery: Secondary | ICD-10-CM | POA: Diagnosis not present

## 2017-09-27 DIAGNOSIS — R69 Illness, unspecified: Secondary | ICD-10-CM | POA: Diagnosis not present

## 2017-09-27 NOTE — Patient Instructions (Addendum)
Stressed the importance of management of risk factors to prevent further stroke ContinueEliquis for secondary stroke prevention Maintain strict control of hypertension with blood pressure goal below 130/90, today's reading114/73  Control of diabetes with hemoglobin A1c below 6.5 followed by primary care  Cholesterol with LDL cholesterol less than 70, followed by primary care,   continue crestor Exercise by walking,   eat healthy diet with whole grains,  fresh fruits and vegetables Right ICA chronic occlusion to be followed with [facility] as outpt. Has appt next week Discharge from stroke clinic  Stroke Prevention Some health problems and behaviors may make it more likely for you to have a stroke. Below are ways to lessen your risk of having a stroke.  Be active for at least 30 minutes on most or all days.  Do not smoke. Try not to be around others who smoke.  Do not drink too much alcohol. ? Do not have more than 2 drinks a day if you are a man. ? Do not have more than 1 drink a day if you are a woman and are not pregnant.  Eat healthy foods, such as fruits and vegetables. If you were put on a specific diet, follow the diet as told.  Keep your cholesterol levels under control through diet and medicines. Look for foods that are low in saturated fat, trans fat, cholesterol, and are high in fiber.  If you have diabetes, follow all diet plans and take your medicine as told.  Ask your doctor if you need treatment to lower your blood pressure. If you have high blood pressure (hypertension), follow all diet plans and take your medicine as told by your doctor.  If you are 15-30 years old, have your blood pressure checked every 3-5 years. If you are age 21 or older, have your blood pressure checked every year.  Keep a healthy weight. Eat foods that are low in calories, salt, saturated fat, trans fat, and cholesterol.  Do not take drugs.  Avoid birth control pills, if this applies. Talk to your  doctor about the risks of taking birth control pills.  Talk to your doctor if you have sleep problems (sleep apnea).  Take all medicine as told by your doctor. ? You may be told to take aspirin or blood thinner medicine. Take this medicine as told by your doctor. ? Understand your medicine instructions.  Make sure any other conditions you have are being taken care of.  Get help right away if:  You suddenly lose feeling (you feel numb) or have weakness in your face, arm, or leg.  Your face or eyelid hangs down to one side.  You suddenly feel confused.  You have trouble talking (aphasia) or understanding what people are saying.  You suddenly have trouble seeing in one or both eyes.  You suddenly have trouble walking.  You are dizzy.  You lose your balance or your movements are clumsy (uncoordinated).  You suddenly have a very bad headache and you do not know the cause.  You have new chest pain.  Your heart feels like it is fluttering or skipping a beat (irregular heartbeat). Do not wait to see if the symptoms above go away. Get help right away. Call your local emergency services (911 in [place] Do not drive yourself to the hospital. This information is not intended to replace advice given to you by your health care provider. Make sure you discuss any questions you have with your health care provider. Document Released: 11/15/2011  Document Revised: 10/22/2015 Document Reviewed: 11/16/2012 Elsevier Interactive Patient Education  [employer].

## 2017-09-29 NOTE — Progress Notes (Signed)
I reviewed above note and agree with the assessment and plan.   Rosalin Hawking, MD PhD Stroke Neurology 09/29/2017 5:54 PM

## 2017-10-03 ENCOUNTER — Ambulatory Visit (HOSPITAL_COMMUNITY)
Admission: RE | Admit: 2017-10-03 | Discharge: 2017-10-03 | Disposition: A | Discharge status: Home / Self Care | Payer: Medicare HMO | Source: Ambulatory Visit | Attending: Family | Admitting: Family

## 2017-10-03 ENCOUNTER — Ambulatory Visit: Payer: Medicare HMO | Admitting: Family

## 2017-10-03 ENCOUNTER — Encounter: Payer: Self-pay | Admitting: Family

## 2017-10-03 ENCOUNTER — Other Ambulatory Visit: Payer: Self-pay

## 2017-10-03 VITALS — BP 136/78 | HR 89 | Resp 20 | Ht 67.5 in | Wt 123.0 lb

## 2017-10-03 DIAGNOSIS — E785 Hyperlipidemia, unspecified: Secondary | ICD-10-CM | POA: Diagnosis not present

## 2017-10-03 DIAGNOSIS — I6522 Occlusion and stenosis of left carotid artery: Secondary | ICD-10-CM | POA: Diagnosis not present

## 2017-10-03 DIAGNOSIS — I6521 Occlusion and stenosis of right carotid artery: Secondary | ICD-10-CM

## 2017-10-03 DIAGNOSIS — R69 Illness, unspecified: Secondary | ICD-10-CM | POA: Diagnosis not present

## 2017-10-03 DIAGNOSIS — F17200 Nicotine dependence, unspecified, uncomplicated: Secondary | ICD-10-CM

## 2017-10-03 DIAGNOSIS — I6523 Occlusion and stenosis of bilateral carotid arteries: Secondary | ICD-10-CM | POA: Diagnosis not present

## 2017-10-03 NOTE — Patient Instructions (Signed)
Steps to Quit Smoking Smoking tobacco can be bad for your health. It can also affect almost every organ in your body. Smoking puts you and people around you at risk for many serious long-lasting (chronic) diseases. Quitting smoking is hard, but it is one of the best things that you can do for your health. It is never too late to quit. What are the benefits of quitting smoking? When you quit smoking, you lower your risk for getting serious diseases and conditions. They can include:  Lung cancer or lung disease.  Heart disease.  Stroke.  Heart attack.  Not being able to have children (infertility).  Weak bones (osteoporosis) and broken bones (fractures).  If you have coughing, wheezing, and shortness of breath, those symptoms may get better when you quit. You may also get sick less often. If you are pregnant, quitting smoking can help to lower your chances of having a baby of low birth weight. What can I do to help me quit smoking? Talk with your doctor about what can help you quit smoking. Some things you can do (strategies) include:  Quitting smoking totally, instead of slowly cutting back how much you smoke over a period of time.  Going to in-person counseling. You are more likely to quit if you go to many counseling sessions.  Using resources and support systems, such as: ? Online chats with a counselor. ? Phone quitlines. ? Printed self-help materials. ? Support groups or group counseling. ? Text messaging programs. ? Mobile phone apps or applications.  Taking medicines. Some of these medicines may have nicotine in them. If you are pregnant or breastfeeding, do not take any medicines to quit smoking unless your doctor says it is okay. Talk with your doctor about counseling or other things that can help you.  Talk with your doctor about using more than one strategy at the same time, such as taking medicines while you are also going to in-person counseling. This can help make  quitting easier. What things can I do to make it easier to quit? Quitting smoking might feel very hard at first, but there is a lot that you can do to make it easier. Take these steps:  Talk to your family and friends. Ask them to support and encourage you.  Call phone quitlines, reach out to support groups, or work with a counselor.  Ask people who smoke to not smoke around you.  Avoid places that make you want (trigger) to smoke, such as: ? Bars. ? Parties. ? Smoke-break areas at work.  Spend time with people who do not smoke.  Lower the stress in your life. Stress can make you want to smoke. Try these things to help your stress: ? Getting regular exercise. ? Deep-breathing exercises. ? Yoga. ? Meditating. ? Doing a body scan. To do this, close your eyes, focus on one area of your body at a time from head to toe, and notice which parts of your body are tense. Try to relax the muscles in those areas.  Download or buy apps on your mobile phone or tablet that can help you stick to your quit plan. There are many free apps, such as QuitGuide from the CDC (Centers for Disease Control and Prevention). You can find more support from smokefree.gov and other websites.  This information is not intended to replace advice given to you by your health care provider. Make sure you discuss any questions you have with your health care provider. Document Released: 03/12/2009 Document   Revised: 01/12/2016 Document Reviewed: 09/30/2014 Elsevier Interactive Patient Education  2018 [employer]     Stroke Prevention Some health problems and behaviors may make it more likely for you to have a stroke. Below are ways to lessen your risk of having a stroke.  Be active for at least 30 minutes on most or all days.  Do not smoke. Try not to be around others who smoke.  Do not drink too much alcohol. ? Do not have more than 2 drinks a day if you are a man. ? Do not have more than 1 drink a day if you  are a woman and are not pregnant.  Eat healthy foods, such as fruits and vegetables. If you were put on a specific diet, follow the diet as told.  Keep your cholesterol levels under control through diet and medicines. Look for foods that are low in saturated fat, trans fat, cholesterol, and are high in fiber.  If you have diabetes, follow all diet plans and take your medicine as told.  Ask your doctor if you need treatment to lower your blood pressure. If you have high blood pressure (hypertension), follow all diet plans and take your medicine as told by your doctor.  If you are 18-39 years old, have your blood pressure checked every 3-5 years. If you are age 40 or older, have your blood pressure checked every year.  Keep a healthy weight. Eat foods that are low in calories, salt, saturated fat, trans fat, and cholesterol.  Do not take drugs.  Avoid birth control pills, if this applies. Talk to your doctor about the risks of taking birth control pills.  Talk to your doctor if you have sleep problems (sleep apnea).  Take all medicine as told by your doctor. ? You may be told to take aspirin or blood thinner medicine. Take this medicine as told by your doctor. ? Understand your medicine instructions.  Make sure any other conditions you have are being taken care of.  Get help right away if:  You suddenly lose feeling (you feel numb) or have weakness in your face, arm, or leg.  Your face or eyelid hangs down to one side.  You suddenly feel confused.  You have trouble talking (aphasia) or understanding what people are saying.  You suddenly have trouble seeing in one or both eyes.  You suddenly have trouble walking.  You are dizzy.  You lose your balance or your movements are clumsy (uncoordinated).  You suddenly have a very bad headache and you do not know the cause.  You have new chest pain.  Your heart feels like it is fluttering or skipping a beat (irregular  heartbeat). Do not wait to see if the symptoms above go away. Get help right away. Call your local emergency services (911 in [place] Do not drive yourself to the hospital. This information is not intended to replace advice given to you by your health care provider. Make sure you discuss any questions you have with your health care provider. Document Released: 11/15/2011 Document Revised: 10/22/2015 Document Reviewed: 11/16/2012 Elsevier Interactive Patient Education  2018 Elsevier Inc.  

## 2017-10-03 NOTE — Progress Notes (Signed)
Chief Complaint: Follow up Extracranial Carotid Artery Stenosis   History of Present Illness  Daniel Chapman is a 84 y.o. male who was referred to Dr. Donnetta Hutching, and who was found to have a carotid bruit and underwent a duplex for further evaluation of this. Dr. Donnetta Hutching reviewed his [place] from 08/10/2012 which was done at [facility] heart care. 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  Patient has not had previous carotid artery intervention.  He is left-handed. He specifically denies any history of hemiparesis, amaurosis fugax, or any aphasia. He does have a long history of cigarette smoking.  He had vocal cord cancer with radiation tx in 2008.  He had a strongly palpable aortic pulse at his visit on 09/03/2013; aortic Duplex later in April of 2015 revealed no aneurysm of the abdominal aorta. He denies any known personal or family history of aneurysms. He denies back or abdominal pain.  He fell and fractured his right hip on 07/21/2013, had a partial right hip replacement.   He denies claudication symptoms in his legs with walking.   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 MRA of the head right ICA showed chronic occlusion. 2-D echo EF 60-65% no source of embolus. LDL 96. Hemoglobin A1c 6.2. Had cardioversion in 10/24/ 2018 for atrial fibrillation.  Wife states she was told that the stroke was likely caused by small blood clots formed in his heart from the atrial fib.  Wife states he has not had any subsequent strokes or TIA's, has no residual neurological deficit, but she states he does seem to be more forgetful since the stroke, pt states he has balance issues since the stroke, but has not fallen.      Diabetic: A1C was 6.2on 02-06-17 Tobacco HFS:FSELTRV 1/2 ppd  Pt meds include:  Statin : yes ASA: No: he has a history of ITP.  Other anticoagulants/antiplatelets: Eliquis since stroke in September 2018    Past Medical History:  Diagnosis Date  . Carcinoma of vocal cord [facility]) 2008   radiation therapy  . Carotid artery occlusion   . COPD (chronic obstructive pulmonary disease) ([place])   . Fall due to ice or snow Feb. 22, 2015   Partial Right Hip replaced  . History of ITP   . Hx of colonic polyps   . Hyperlipemia   . Osteopenia    ?  Marland Kitchen Osteoporosis    dexa 06, declines further dexa or tx   . Smoker   . Stroke [facility])     Social History Social History   Tobacco Use  . Smoking status: Current Every Day Smoker    Packs/day: 0.50    Types: Cigarettes  . Smokeless tobacco: Never Used  Substance Use Topics  . Alcohol use: No    Alcohol/week: 0.0 oz  . Drug use: No    Family History Family History  Problem Relation Age of Onset  . Uterine cancer Mother   . Heart disease Sister        some heart trouble    Surgical History Past Surgical History:  Procedure Laterality Date  . BONE MARROW BIOPSY  05/2003  . CARDIOVERSION N/A 03/22/2017   Procedure: CARDIOVERSION;  Surgeon: Acie Fredrickson Wonda Cheng, MD;  Location: [facility];  Service: Cardiovascular;  Laterality: N/A;  . CATARACT EXTRACTION    . Dexa     per patient ? osteopenia  . HEMORRHOID SURGERY    . JOINT REPLACEMENT Right Feb. 22, 2015   Partial Hip  . SPLENECTOMY    . TONSILLECTOMY AND ADENOIDECTOMY Bilateral age 40 per patient    No Known Allergies  Current Outpatient Medications  Medication Sig Dispense Refill  . calcium carbonate (TUMS EX) 750 MG chewable tablet Chew 1 tablet by mouth daily. Reported on 09/15/2015    . cholecalciferol (VITAMIN D) 1000 UNITS tablet Take 1,000 Units by mouth daily. Reported on 09/15/2015    . ELIQUIS 2.5 MG TABS tablet TAKE 1 TABLET BY MOUTH TWICE A DAY 60 tablet 5   . rosuvastatin (CRESTOR) 5 MG tablet Take 1 tablet (5 mg total) by mouth 3 (three) times a week. Take on Monday, Wednesday, and Fridays. 45 tablet 2  . sertraline (ZOLOFT) 100 MG tablet TAKE 1/2 TABLET DAILY FOR  DEPRESSION (Patient not taking: Reported on 09/27/2017) 45 tablet 1   No current facility-administered medications for this visit.     Review of Systems : See HPI for pertinent positives and negatives.  Physical Examination  Vitals:   10/03/17 1453 10/03/17 1454  BP: (!) 129/91 136/78  Pulse: 89   Resp: 20   SpO2: 98%   Weight: 123 lb (55.8 kg)   Height: 5' 7.5" (1.715 m)    Body mass index is 18.98 kg/m.  General: WDWN thin, pleasant, animated male in NAD. GAIT:normal   HENT: few remaining teeth in advanced state of decay.  Eyes: PERRLA Pulmonary: Respirations are non-labored, CTAB Cardiac: regular rhythm, no detected murmur.   VASCULAR EXAM Carotid Bruits Left Right   Negative  positive    Abdominal aortic pulse is not palpable.  Radial pulses are 2+ palpable and equal.   LE Pulses  LEFT  RIGHT   FEMORAL  palpable palpable   POPLITEAL  not palpable  not palpable  POSTERIOR TIBIAL  not palpable  not palpable   DORSALIS PEDIS ANTERIOR TIBIAL  Faintly palpable  Faintly palpable    Gastrointestinal: soft, nontender, BS WNL, no r/g, no palpable masses.  Musculoskeletal: No muscle atrophy/wasting. M/S 5/5 throughout, Extremities without ischemic changes.  Skin: No rashes, no ulcers, no cellulitis.   Neurologic:  A&O X 3; appropriate affect, sensation is normal; speech is normal, CN 2-12 intact except has some hearing loss, pain and light touch intact in extremities, motor exam as listed above.  Psychiatric: Normal thought content, mood appropriate to clinical situation.    Assessment: Daniel Chapman is a 84 y.o. male who has a known occlusion of his right internal carotid artery and minimal  stenosis of the left internal carotid artery.  He had no known stroke until September 2018, thought to be of cardiac origin with new onset atrial fib, started on Eliquis at that time. He has no residual neurological deficits other than balance and memory issues. He has not had any known subsequent stroke or TIA.   He is taking a statin.    He does have a long history of cigarette smoking, currently 1/2 ppd.  DATA  Carotid Duplex (10/03/17): Right ICA: confirmed occlusion Left ICA: 1-39% stenosis The left proximal ECA appears occluded Bilateral vertebral artery flow is antegrade.  Bilateral subclavian artery waveforms are normal.  No significant change compared to the exams on 09-15-15 and 09-27-16.     Plan: Follow-up in 1year with Carotid Duplex scan.  Over 3 minutes was spent counseling patient re smoking cessation, and patient was given several free resources re smoking cessation.   I discussed in depth with the patient the nature of atherosclerosis, and emphasized the importance of maximal medical management including strict control of blood pressure, blood glucose, and lipid levels, obtaining regular exercise, and cessation of smoking.  The patient is aware that without maximal medical management the underlying atherosclerotic disease process will progress, limiting the benefit of any interventions. The patient was given information about stroke prevention and what symptoms should prompt the patient to seek immediate medical care. Thank you for allowing [place] to participate in this patient's care.  Clemon Chambers, RN, MSN, FNP-C [facility] Office: [facility] Physician: Early  10/03/17 3:17 PM

## 2017-10-11 ENCOUNTER — Ambulatory Visit (INDEPENDENT_AMBULATORY_CARE_PROVIDER_SITE_OTHER): Payer: Medicare HMO | Admitting: Family Medicine

## 2017-10-11 ENCOUNTER — Encounter: Payer: Self-pay | Admitting: Family Medicine

## 2017-10-11 DIAGNOSIS — R350 Frequency of micturition: Secondary | ICD-10-CM | POA: Diagnosis not present

## 2017-10-11 DIAGNOSIS — N401 Enlarged prostate with lower urinary tract symptoms: Secondary | ICD-10-CM

## 2017-10-11 DIAGNOSIS — N400 Enlarged prostate without lower urinary tract symptoms: Secondary | ICD-10-CM | POA: Insufficient documentation

## 2017-10-11 LAB — POC URINALSYSI DIPSTICK (AUTOMATED)
Bilirubin, UA: NEGATIVE
Blood, UA: NEGATIVE
Glucose, UA: NEGATIVE
Ketones, UA: NEGATIVE
Leukocytes, UA: NEGATIVE
NITRITE UA: NEGATIVE
UROBILINOGEN UA: 0.2 U/dL
pH, UA: 5.5 (ref 5.0–8.0)

## 2017-10-11 MED ORDER — TAMSULOSIN HCL 0.4 MG PO CAPS: 0.4000 mg | ORAL_CAPSULE | Freq: Every day | ORAL | 11 refills | Status: DC

## 2017-10-11 NOTE — Patient Instructions (Addendum)
Let's get a urine sample- if it looks like there is infection we will treat with an antibiotic  If it looks normal - we can try a medicine to help urination with enlarged prostate   Drink more water for kidney and bladder health   If you change your mind about physical therapy for balance - let me know

## 2017-10-11 NOTE — Progress Notes (Signed)
Subjective:    Patient ID: Daniel Chapman, male    DOB: 06/17/1931, 84 y.o.   MRN: [id number]  HPI Here for symptom of urinary frequency   Wt Readings from Last 3 Encounters:  10/11/17 122 lb 12 oz (55.7 kg)  10/03/17 123 lb (55.8 kg)  09/27/17 123 lb 9.6 oz (56.1 kg)   Eating is fair 18.94 kg/m   Urinary frequency  Much worse lately - up to every 30 minutes (worse at night)  He even urinates in the car in a cup  No incontinence   No dysuria  Unsure if he has a prostate problem  Stream is not as strong  No blood in urine  Stream is slow however - takes longer to empty   Urine looks a little darker than usual  He does not drink enough water - afraid it will make him urinate more   Lab Results  Component Value Date   CREATININE 0.87 03/21/2017   BUN 20 03/21/2017   NA 142 03/21/2017   K 4.4 03/21/2017   CL 106 03/21/2017   CO2 28 03/21/2017      Lab Results  Component Value Date   PSA 0.36 12/10/2014   PSA 0.48 07/11/2011   PSA 0.41 05/12/2010   he has had prostatitis in the past   Results for orders placed or performed in visit on 10/11/17  POCT Urinalysis Dipstick (Automated)  Result Value Ref Range   Color, UA Yellow    Clarity, UA Clear    Glucose, UA Negative    Bilirubin, UA Negative    Ketones, UA Negative    Spec Grav, UA >=1.030 (A) 1.010 - 1.025   Blood, UA Negative    pH, UA 5.5 5.0 - 8.0   Protein, UA 15 mg/dL    Urobilinogen, UA 0.2 0.2 or 1.0 E.U./dL   Nitrite, UA Negative    Leukocytes, UA Negative Negative     Urine is concentrated but otherwise clear   Patient Active Problem List   Diagnosis Date Noted  . Urinary frequency 10/11/2017  . BPH (benign prostatic hyperplasia) 10/11/2017  . Chronic anticoagulation 06/29/2017  . Carotid occlusion, right   . Stroke ([place]) 02/06/2017  . Atrial fibrillation, new onset ([place]) 02/06/2017  . Sebaceous cyst 01/03/2017  . Encounter for Medicare annual wellness exam 12/17/2014  . Special  screening for malignant neoplasms, colon 12/17/2014  . Routine general medical examination at a health care facility 12/09/2014  . Aneurysm of abdominal vessel ([place]) 09/11/2013  . S/P total hip arthroplasty 08/02/2013  . H/O splenectomy 08/02/2013  . History of hip fracture 07/20/2013  . Occlusion and stenosis of carotid artery without mention of cerebral infarction 08/28/2012  . Carotid stenosis 08/14/2012  . Bruit of left carotid artery 08/01/2012  . Anxiety 01/27/2012  . Osteopenia 07/19/2011  . Prostate cancer screening 07/10/2011  . HOARSENESS, CHRONIC 02/05/2007  . 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 Hyperlipidemia 11/06/2006  . TOBACCO ABUSE 11/06/2006  . COPD (chronic obstructive pulmonary disease) ([place]) 11/06/2006  . COLONIC POLYPS, HX OF 11/06/2006  . PROSTATITIS, HX OF 11/06/2006  . 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 Carotid artery occlusion   . COPD (chronic obstructive pulmonary disease) ([place])   . Fall due to ice or snow Feb. 22, 2015   Partial Right Hip replaced  . History of ITP   .  Hx of colonic polyps   . Hyperlipemia   . Osteopenia    ?  Marland Kitchen Osteoporosis    dexa 06, declines further dexa or tx   . Smoker   . Stroke [facility])    Past Surgical History:  Procedure Laterality Date  . BONE MARROW BIOPSY  05/2003  . CARDIOVERSION N/A 03/22/2017   Procedure: CARDIOVERSION;  Surgeon: Acie Fredrickson Wonda Cheng, MD;  Location: [place];  Service: Cardiovascular;  Laterality: N/A;  . CATARACT EXTRACTION    . Dexa     per patient ? osteopenia  . HEMORRHOID SURGERY    . JOINT REPLACEMENT Right Feb. 22, 2015   Partial Hip  . SPLENECTOMY    . TONSILLECTOMY AND ADENOIDECTOMY Bilateral age 88 per patient   Social History   Tobacco Use  . Smoking status: Current Every Day Smoker    Packs/day: 0.50    Types: Cigarettes  . Smokeless tobacco: Never Used    Substance Use Topics  . Alcohol use: No    Alcohol/week: 0.0 oz  . Drug use: No   Family History  Problem Relation Age of Onset  . Uterine cancer Mother   . Heart disease Sister        some heart trouble   No Known Allergies Current Outpatient Medications on File Prior to Visit  Medication Sig Dispense Refill  . calcium carbonate (TUMS EX) 750 MG chewable tablet Chew 1 tablet by mouth daily. Reported on 09/15/2015    . cholecalciferol (VITAMIN D) 1000 UNITS tablet Take 1,000 Units by mouth daily. Reported on 09/15/2015    . ELIQUIS 2.5 MG TABS tablet TAKE 1 TABLET BY MOUTH TWICE A DAY 60 tablet 5  . rosuvastatin (CRESTOR) 5 MG tablet Take 1 tablet (5 mg total) by mouth 3 (three) times a week. Take on Monday, Wednesday, and Fridays. 45 tablet 2   No current facility-administered medications on file prior to visit.     Review of Systems  Constitutional: Negative for activity change, appetite change, fatigue, fever and unexpected weight change.  HENT: Negative for congestion, rhinorrhea, sore throat and trouble swallowing.   Eyes: Negative for pain, redness, itching and visual disturbance.  Respiratory: Negative for cough, chest tightness, shortness of breath and wheezing.   Cardiovascular: Negative for chest pain and palpitations.  Gastrointestinal: Negative for abdominal pain, blood in stool, constipation, diarrhea and nausea.  Endocrine: Negative for cold intolerance, heat intolerance, polydipsia and polyuria.  Genitourinary: Positive for frequency. Negative for difficulty urinating, dysuria and urgency.  Musculoskeletal: Negative for arthralgias, joint swelling and myalgias.  Skin: Negative for pallor and rash.  Neurological: Negative for dizziness, tremors, weakness, numbness and headaches.       Worse balance since stroke  Declines PT for this yet   Hematological: Negative for adenopathy. Does not bruise/bleed easily.  Psychiatric/Behavioral: Negative for decreased  concentration and dysphoric mood. The patient is not nervous/anxious.        Objective:   Physical Exam  Constitutional: He appears well-developed and well-nourished. No distress.  Slim and well app  HENT:  Head: Normocephalic and atraumatic.  Eyes: Pupils are equal, round, and reactive to light. Conjunctivae and EOM are normal.  Neck: Normal range of motion. Neck supple.  Cardiovascular: Normal rate and normal heart sounds.  Pulmonary/Chest: Effort normal and breath sounds normal. No respiratory distress. He has no wheezes.  Diffusely distant bs   Abdominal: Soft. Bowel sounds are normal. He exhibits no distension. There is no tenderness.  No suprapubic  tenderness or fullness   No cva tenderness   Genitourinary: Rectal exam shows no mass, no tenderness and anal tone normal. Prostate is enlarged.  Genitourinary Comments: Prostate is enlarged Firm/symmetric with no nodules  Non tender     Lymphadenopathy:    He has no cervical adenopathy.  Neurological: He is alert. He displays normal reflexes.  Skin: Skin is warm and dry. No rash noted.  Psychiatric:  Baseline boisterous personality Mood is good today          Assessment & Plan:   Problem List Items Addressed This Visit      Genitourinary   BPH (benign prostatic hyperplasia)    With more urinary frequency that is bothersome  Lab Results  Component Value Date   PSA 0.36 12/10/2014   PSA 0.48 07/11/2011   PSA 0.41 05/12/2010    Will try flomax 0.4 mg daily  Disc poss side eff incl low bp-watch closely for this  Keep updated       Relevant Medications   tamsulosin (FLOMAX) 0.4 MG CAPS capsule     Other   Urinary frequency    Clear ua (concentrated so enc inc fluid intake) Will try flomax 0.4 mg daily - for BPH Rev poss side eff  Consider urol ref if no imp      Relevant Orders   POCT Urinalysis Dipstick (Automated) (Completed)

## 2017-10-13 ENCOUNTER — Telehealth: Payer: Self-pay | Admitting: Family Medicine

## 2017-10-13 NOTE — Telephone Encounter (Signed)
I addressed pt's wife's concerns and she will keep an eye on pt's BP if it becomes to low she will call us asap

## 2017-10-13 NOTE — Telephone Encounter (Signed)
Attempted to contact pt; unable to leave vm 

## 2017-10-13 NOTE — Telephone Encounter (Signed)
Copied from [place] [id number]. Topic: Quick Communication - See Telephone Encounter >> Oct 13, 2017 10:38 AM Clack, Laban Emperor wrote: CRM for notification. See Telephone encounter for: 10/13/17.  Pt wife [name] would like a nurse to give her a call. States she has a question about the pt tamsulosin (FLOMAX) 0.4 MG CAPS capsule [[id number]]. One of the side effects low BP.

## 2017-10-15 NOTE — Assessment & Plan Note (Signed)
Clear ua (concentrated so enc inc fluid intake) Will try flomax 0.4 mg daily - for BPH Rev poss side eff  Consider urol ref if no imp

## 2017-10-15 NOTE — Assessment & Plan Note (Signed)
With more urinary frequency that is bothersome  Lab Results  Component Value Date   PSA 0.36 12/10/2014   PSA 0.48 07/11/2011   PSA 0.41 05/12/2010    Will try flomax 0.4 mg daily  Disc poss side eff incl low bp-watch closely for this  Keep updated

## 2017-11-22 DIAGNOSIS — I4891 Unspecified atrial fibrillation: Secondary | ICD-10-CM | POA: Diagnosis not present

## 2017-11-22 DIAGNOSIS — J449 Chronic obstructive pulmonary disease, unspecified: Secondary | ICD-10-CM | POA: Diagnosis not present

## 2017-11-22 DIAGNOSIS — N400 Enlarged prostate without lower urinary tract symptoms: Secondary | ICD-10-CM | POA: Diagnosis not present

## 2017-11-22 DIAGNOSIS — Z8521 Personal history of malignant neoplasm of larynx: Secondary | ICD-10-CM | POA: Diagnosis not present

## 2017-11-22 DIAGNOSIS — Z8249 Family history of ischemic heart disease and other diseases of the circulatory system: Secondary | ICD-10-CM | POA: Diagnosis not present

## 2017-11-22 DIAGNOSIS — Z7901 Long term (current) use of anticoagulants: Secondary | ICD-10-CM | POA: Diagnosis not present

## 2017-11-22 DIAGNOSIS — R69 Illness, unspecified: Secondary | ICD-10-CM | POA: Diagnosis not present

## 2017-11-22 DIAGNOSIS — E785 Hyperlipidemia, unspecified: Secondary | ICD-10-CM | POA: Diagnosis not present

## 2017-11-22 DIAGNOSIS — I739 Peripheral vascular disease, unspecified: Secondary | ICD-10-CM | POA: Diagnosis not present

## 2018-01-05 ENCOUNTER — Ambulatory Visit (INDEPENDENT_AMBULATORY_CARE_PROVIDER_SITE_OTHER): Payer: Medicare HMO

## 2018-01-05 VITALS — BP 112/80 | HR 82 | Temp 97.4°F | Ht 67.5 in | Wt 123.0 lb

## 2018-01-05 DIAGNOSIS — Z0000 Encounter for general adult medical examination without abnormal findings: Secondary | ICD-10-CM | POA: Diagnosis not present

## 2018-01-05 DIAGNOSIS — N400 Enlarged prostate without lower urinary tract symptoms: Secondary | ICD-10-CM

## 2018-01-05 DIAGNOSIS — E785 Hyperlipidemia, unspecified: Secondary | ICD-10-CM

## 2018-01-05 LAB — COMPREHENSIVE METABOLIC PANEL
ALBUMIN: 3.7 g/dL (ref 3.5–5.2)
ALT: 7 U/L (ref 0–53)
AST: 14 U/L (ref 0–37)
Alkaline Phosphatase: 52 U/L (ref 39–117)
BILIRUBIN TOTAL: 0.6 mg/dL (ref 0.2–1.2)
BUN: 18 mg/dL (ref 6–23)
CALCIUM: 9.5 mg/dL (ref 8.4–10.5)
CO2: 29 meq/L (ref 19–32)
CREATININE: 0.87 mg/dL (ref 0.40–1.50)
Chloride: 106 mEq/L (ref 96–112)
GFR: 88.79 mL/min (ref >60.00)
Glucose, Bld: 100 mg/dL — ABNORMAL HIGH (ref 70–99)
Potassium: 4.8 mEq/L (ref 3.5–5.1)
Sodium: 140 mEq/L (ref 135–145)
TOTAL PROTEIN: 7 g/dL (ref 6.0–8.3)

## 2018-01-05 LAB — CBC WITH DIFFERENTIAL/PLATELET
BASOS ABS: 0.1 10*3/uL (ref 0.0–0.1)
Basophils Relative: 0.8 % (ref 0.0–3.0)
EOS ABS: 0.2 10*3/uL (ref 0.0–0.7)
Eosinophils Relative: 2.7 % (ref 0.0–5.0)
HEMATOCRIT: 40.1 % (ref 39.0–52.0)
Hemoglobin: 13 g/dL (ref 13.0–17.0)
LYMPHS PCT: 34.6 % (ref 12.0–46.0)
Lymphs Abs: 2.5 10*3/uL (ref 0.7–4.0)
MCHC: 32.4 g/dL (ref 30.0–36.0)
MCV: 91.4 fl (ref 78.0–100.0)
Monocytes Absolute: 0.9 10*3/uL (ref 0.1–1.0)
Monocytes Relative: 12.1 % — ABNORMAL HIGH (ref 3.0–12.0)
NEUTROS ABS: 3.6 10*3/uL (ref 1.4–7.7)
Neutrophils Relative %: 49.8 % (ref 43.0–77.0)
PLATELETS: 212 10*3/uL (ref 150.0–400.0)
RBC: 4.38 Mil/uL (ref 4.22–5.81)
RDW: 16.4 % — ABNORMAL HIGH (ref 11.5–15.5)
WBC: 7.3 10*3/uL (ref 4.0–10.5)

## 2018-01-05 LAB — LIPID PANEL
CHOLESTEROL: 100 mg/dL (ref 0–200)
HDL: 28.5 mg/dL — ABNORMAL LOW (ref >39.00)
LDL Cholesterol: 53 mg/dL (ref 0–99)
NonHDL: 71.3
TRIGLYCERIDES: 94 mg/dL (ref 0.0–149.0)
Total CHOL/HDL Ratio: 4
VLDL: 18.8 mg/dL (ref 0.0–40.0)

## 2018-01-05 LAB — PSA, MEDICARE: PSA: 0.54 ng/ml (ref 0.10–4.00)

## 2018-01-05 LAB — TSH: TSH: 1.22 u[IU]/mL (ref 0.35–4.50)

## 2018-01-05 NOTE — Patient Instructions (Addendum)
Daniel Chapman , Thank you for taking time to come for your Medicare Wellness Visit. I appreciate your ongoing commitment to your health goals. Please review the following plan we discussed and let me know if I can assist you in the future.   These are the goals we discussed: Goals    . Patient Stated     Starting 01/05/2018, I will continue to take medications as prescribed.        This is a list of the screening recommended for you and due dates:  Health Maintenance  Topic Date Due  . Flu Shot  08/28/2018*  . Tetanus Vaccine  04/07/2022  . Pneumonia vaccines  Completed  *Topic was postponed. The date shown is not the original due date.   Preventive Care for Adults  A healthy lifestyle and preventive care can promote health and wellness. Preventive health guidelines for adults include the following key practices.  . A routine yearly physical is a good way to check with your health care provider about your health and preventive screening. It is a chance to share any concerns and updates on your health and to receive a thorough exam.  . Visit your dentist for a routine exam and preventive care every 6 months. Brush your teeth twice a day and floss once a day. Good oral hygiene prevents tooth decay and gum disease.  . The frequency of eye exams is based on your age, health, family medical history, use  of contact lenses, and other factors. Follow your health care provider's recommendations for frequency of eye exams.  . Eat a healthy diet. Foods like vegetables, fruits, whole grains, low-fat dairy products, and lean protein foods contain the nutrients you need without too many calories. Decrease your intake of foods high in solid fats, added sugars, and salt. Eat the right amount of calories for you. Get information about a proper diet from your health care provider, if necessary.  . Regular physical exercise is one of the most important things you can do for your health. Most adults should  get at least 150 minutes of moderate-intensity exercise (any activity that increases your heart rate and causes you to sweat) each week. In addition, most adults need muscle-strengthening exercises on 2 or more days a week.  Silver Sneakers may be a benefit available to you. To determine eligibility, you may visit the website: www.silversneakers.com or contact program at 867-560-0018 Mon-Fri between 8AM-8PM.   . Maintain a healthy weight. The body mass index (BMI) is a screening tool to identify possible weight problems. It provides an estimate of body fat based on height and weight. Your health care provider can find your BMI and can help you achieve or maintain a healthy weight.   For adults 20 years and older: ? A BMI below 18.5 is considered underweight. ? A BMI of 18.5 to 24.9 is normal. ? A BMI of 25 to 29.9 is considered overweight. ? A BMI of 30 and above is considered obese.   . Maintain normal blood lipids and cholesterol levels by exercising and minimizing your intake of saturated fat. Eat a balanced diet with plenty of fruit and vegetables. Blood tests for lipids and cholesterol should begin at age 64 and be repeated every 5 years. If your lipid or cholesterol levels are high, you are over 50, or you are at high risk for heart disease, you may need your cholesterol levels checked more frequently. Ongoing high lipid and cholesterol levels should be treated with medicines  if diet and exercise are not working.  . If you smoke, find out from your health care provider how to quit. If you do not use tobacco, please do not start.  . If you choose to drink alcohol, please do not consume more than 2 drinks per day. One drink is considered to be 12 ounces (355 mL) of beer, 5 ounces (148 mL) of wine, or 1.5 ounces (44 mL) of liquor.  . If you are 78-66 years old, ask your health care provider if you should take aspirin to prevent strokes.  . Use sunscreen. Apply sunscreen liberally and  repeatedly throughout the day. You should seek shade when your shadow is shorter than you. Protect yourself by wearing long sleeves, pants, a wide-brimmed hat, and sunglasses year round, whenever you are outdoors.  . Once a month, do a whole body skin exam, using a mirror to look at the skin on your back. Tell your health care provider of new moles, moles that have irregular borders, moles that are larger than a pencil eraser, or moles that have changed in shape or color.

## 2018-01-12 ENCOUNTER — Encounter: Payer: Self-pay | Admitting: Family Medicine

## 2018-01-12 ENCOUNTER — Ambulatory Visit (INDEPENDENT_AMBULATORY_CARE_PROVIDER_SITE_OTHER): Payer: Medicare HMO | Admitting: Family Medicine

## 2018-01-12 VITALS — BP 118/70 | HR 85 | Temp 98.4°F | Ht 67.5 in | Wt 121.5 lb

## 2018-01-12 DIAGNOSIS — N401 Enlarged prostate with lower urinary tract symptoms: Secondary | ICD-10-CM | POA: Diagnosis not present

## 2018-01-12 DIAGNOSIS — F17200 Nicotine dependence, unspecified, uncomplicated: Secondary | ICD-10-CM | POA: Diagnosis not present

## 2018-01-12 DIAGNOSIS — C321 Malignant neoplasm of supraglottis: Secondary | ICD-10-CM

## 2018-01-12 DIAGNOSIS — M8580 Other specified disorders of bone density and structure, unspecified site: Secondary | ICD-10-CM | POA: Diagnosis not present

## 2018-01-12 DIAGNOSIS — C320 Malignant neoplasm of glottis: Secondary | ICD-10-CM | POA: Diagnosis not present

## 2018-01-12 DIAGNOSIS — I714 Abdominal aortic aneurysm, without rupture, unspecified: Secondary | ICD-10-CM

## 2018-01-12 DIAGNOSIS — J449 Chronic obstructive pulmonary disease, unspecified: Secondary | ICD-10-CM | POA: Diagnosis not present

## 2018-01-12 DIAGNOSIS — Z0000 Encounter for general adult medical examination without abnormal findings: Secondary | ICD-10-CM

## 2018-01-12 DIAGNOSIS — R351 Nocturia: Secondary | ICD-10-CM

## 2018-01-12 DIAGNOSIS — E7800 Pure hypercholesterolemia, unspecified: Secondary | ICD-10-CM

## 2018-01-12 DIAGNOSIS — R69 Illness, unspecified: Secondary | ICD-10-CM | POA: Diagnosis not present

## 2018-01-12 NOTE — Patient Instructions (Addendum)
Make sure to get your flu shot this fall   Take care of yourself  Weight is down- please don't skip meals (ensure or protein bars are good if you have to miss a meal)  Eat snacks whenever you can   Think about quitting smoking  Labs are stable

## 2018-01-12 NOTE — Progress Notes (Signed)
Subjective:    Patient ID: Daniel Chapman, male    DOB: 1931/07/09, 84 y.o.   MRN: [id number]  HPI Here for health maintenance exam and to review chronic medical problems    Has felt fair/doing ok  Working and busy-enjoys that   [facility] Readings from Last 3 Encounters:  01/12/18 121 lb 8 oz (55.1 kg)  01/05/18 123 lb (55.8 kg)  10/11/17 122 lb 12 oz (55.7 kg)  he "eats like a horse"  Anything he wants - high metabolism all his life  18.75 kg/m   Had amw on 8/9  Gets flu shots in the fall    Prostate health Took flomax for BPH  Doing fine w/o it now  He urinates frequently - up at night twice (he regulates by intake at night)  Lab Results  Component Value Date   PSA 0.54 01/05/2018   PSA 0.36 12/10/2014   PSA 0.48 07/11/2011    Had zostavax 10/15   dexa 2/06 osteopenia Declined further eval or tx fx hip in 2015 No recent fractures and no falls  Taking ca and D    Smoking status -no change  Copd - is stable / not bothered by the heat and doing ok overall  No medicines currently   Colonoscopy 1/10 with polyp  Hemorrhoids in the past -not now No blood or change in bowel habits   Past hx of vocal cord carcinoma -doing well / pt thinks oncologist signed off   Depression -takes sertaline  He is doing well / he is no longer "down in the dumps"   Has hx of AAA and carotid stenosis- followed by vascular No clinical changes  A fib  eliquis  crestor three times per week   BP BP Readings from Last 3 Encounters:  01/12/18 118/70  01/05/18 112/80  10/11/17 112/64    Hyperlipidemia Lab Results  Component Value Date   CHOL 100 01/05/2018   CHOL 108 04/10/2017   CHOL 148 02/06/2017   Lab Results  Component Value Date   HDL 28.50 (L) 01/05/2018   HDL 32 (L) 04/10/2017   HDL 29 (L) 02/06/2017   Lab Results  Component Value Date   LDLCALC 53 01/05/2018   LDLCALC 57 04/10/2017   LDLCALC 96 02/06/2017   Lab Results  Component Value Date   TRIG 94.0  01/05/2018   TRIG 94 04/10/2017   TRIG 116 02/06/2017   Lab Results  Component Value Date   CHOLHDL 4 01/05/2018   CHOLHDL 3.4 04/10/2017   CHOLHDL 5.1 02/06/2017  tolerates crestor three times per week    No results found for: LDLDIRECT Chronic low HDL   Glucose was 100 this check   Other labs Lab Results  Component Value Date   CREATININE 0.87 01/05/2018   BUN 18 01/05/2018   NA 140 01/05/2018   K 4.8 01/05/2018   CL 106 01/05/2018   CO2 29 01/05/2018   Lab Results  Component Value Date   ALT 7 01/05/2018   AST 14 01/05/2018   ALKPHOS 52 01/05/2018   BILITOT 0.6 01/05/2018   Lab Results  Component Value Date   WBC 7.3 01/05/2018   HGB 13.0 01/05/2018   HCT 40.1 01/05/2018   MCV 91.4 01/05/2018   PLT 212.0 01/05/2018   Lab Results  Component Value Date   TSH 1.22 01/05/2018     Patient Active Problem List   Diagnosis Date Noted  . Urinary frequency 10/11/2017  . BPH (benign prostatic  hyperplasia) 10/11/2017  . Chronic anticoagulation 06/29/2017  . Carotid occlusion, right   . H/O: stroke 02/06/2017  . Atrial fibrillation, new onset ([place]) 02/06/2017  . Encounter for Medicare annual wellness exam 12/17/2014  . Special screening for malignant neoplasms, colon 12/17/2014  . Routine general medical examination at a health care facility 12/09/2014  . Aneurysm of abdominal vessel ([place]) 09/11/2013  . S/P total hip arthroplasty 08/02/2013  . H/O splenectomy 08/02/2013  . History of hip fracture 07/20/2013  . Occlusion and stenosis of carotid artery without mention of cerebral infarction 08/28/2012  . Carotid stenosis 08/14/2012  . Bruit of left carotid artery 08/01/2012  . Anxiety 01/27/2012  . Osteopenia 07/19/2011  . HOARSENESS, CHRONIC 02/05/2007  . 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 Hyperlipidemia 11/06/2006  . TOBACCO ABUSE 11/06/2006  . COPD (chronic obstructive pulmonary disease) ([place]) 11/06/2006  . COLONIC POLYPS, HX OF 11/06/2006    . PROSTATITIS, HX OF 11/06/2006   Past Medical History:  Diagnosis Date  . Carcinoma of vocal cord [facility]) 2008   radiation therapy  . Carotid artery occlusion   . COPD (chronic obstructive pulmonary disease) ([place])   . Fall due to ice or snow Feb. 22, 2015   Partial Right Hip replaced  . History of ITP   . Hx of colonic polyps   . Hyperlipemia   . Osteopenia    ?  Marland Kitchen Osteoporosis    dexa 06, declines further dexa or tx   . Smoker   . Stroke [facility])    Past Surgical History:  Procedure Laterality Date  . BONE MARROW BIOPSY  05/2003  . CARDIOVERSION N/A 03/22/2017   Procedure: CARDIOVERSION;  Surgeon: Acie Fredrickson Wonda Cheng, MD;  Location: [place];  Service: Cardiovascular;  Laterality: N/A;  . CATARACT EXTRACTION    . Dexa     per patient ? osteopenia  . HEMORRHOID SURGERY    . JOINT REPLACEMENT Right Feb. 22, 2015   Partial Hip  . SPLENECTOMY    . TONSILLECTOMY AND ADENOIDECTOMY Bilateral age 62 per patient   Social History   Tobacco Use  . Smoking status: Current Every Day Smoker    Packs/day: 0.50    Types: Cigarettes  . Smokeless tobacco: Never Used  Substance Use Topics  . Alcohol use: No    Alcohol/week: 0.0 standard drinks  . Drug use: No   Family History  Problem Relation Age of Onset  . Uterine cancer Mother   . Heart disease Sister        some heart trouble   No Known Allergies Current Outpatient Medications on File Prior to Visit  Medication Sig Dispense Refill  . calcium carbonate (TUMS EX) 750 MG chewable tablet Chew 1 tablet by mouth daily. Reported on 09/15/2015    . cholecalciferol (VITAMIN D) 1000 UNITS tablet Take 1,000 Units by mouth daily. Reported on 09/15/2015    . ELIQUIS 2.5 MG TABS tablet TAKE 1 TABLET BY MOUTH TWICE A DAY 60 tablet 5  . rosuvastatin (CRESTOR) 5 MG tablet Take 1 tablet (5 mg total) by mouth 3 (three) times a week. Take on Monday, Wednesday, and Fridays. 45 tablet 2   No current facility-administered medications on file  prior to visit.     Review of Systems  Constitutional: Positive for fatigue. Negative for activity change, appetite change, fever and unexpected weight change.       Tired at times from busy schedule   HENT: Negative for congestion, rhinorrhea, sore throat and trouble  swallowing.   Eyes: Negative for pain, redness, itching and visual disturbance.  Respiratory: Negative for cough, chest tightness, shortness of breath and wheezing.   Cardiovascular: Negative for chest pain and palpitations.  Gastrointestinal: Negative for abdominal pain, blood in stool, constipation, diarrhea and nausea.  Endocrine: Negative for cold intolerance, heat intolerance, polydipsia and polyuria.  Genitourinary: Positive for frequency. Negative for difficulty urinating, dysuria and urgency.  Musculoskeletal: Negative for arthralgias, joint swelling and myalgias.       Occ R knee stiffness  Skin: Negative for pallor and rash.  Neurological: Negative for dizziness, tremors, weakness, numbness and headaches.  Hematological: Negative for adenopathy. Does not bruise/bleed easily.  Psychiatric/Behavioral: Negative for decreased concentration and dysphoric mood. The patient is not nervous/anxious.        Mood is good lately        Objective:   Physical Exam  Constitutional: He appears well-developed. No distress.  Elderly male-underweight but not frail appearing   HENT:  Head: Normocephalic and atraumatic.  Right Ear: External ear normal.  Left Ear: External ear normal.  Nose: Nose normal.  Mouth/Throat: Oropharynx is clear and moist.  Poor dentition- enc him to get teeth pulled   Eyes: Pupils are equal, round, and reactive to light. Conjunctivae and EOM are normal. Right eye exhibits no discharge. Left eye exhibits no discharge. No scleral icterus.  Neck: Normal range of motion. Neck supple. No JVD present. Carotid bruit is present. No thyromegaly present.  Right carotid bruit   Cardiovascular: Normal rate,  regular rhythm, normal heart sounds and intact distal pulses. Exam reveals no gallop.  Pulmonary/Chest: Effort normal and breath sounds normal. No stridor. No respiratory distress. He has no wheezes. He has no rales. He exhibits no tenderness.  Diffusely distant bs Good air exch today w/o wheezing   Abdominal: Soft. Bowel sounds are normal. He exhibits no distension, no abdominal bruit, no pulsatile midline mass and no mass. There is no tenderness. There is no guarding.  Musculoskeletal: He exhibits no edema or tenderness.  Lymphadenopathy:    He has no cervical adenopathy.  Neurological: He is alert. He has normal reflexes. He displays normal reflexes. No cranial nerve deficit. He exhibits normal muscle tone. Coordination normal.  Skin: Skin is warm and dry. No rash noted. No erythema. No pallor.  Few lentigines   Psychiatric: He has a normal mood and affect.  His usual hyperactive affect           Assessment & Plan:   Problem List Items Addressed This Visit      Cardiovascular and Mediastinum   Aneurysm of abdominal vessel ([place])    Continues cardiology/vascular f/u for this and carotid dz and afib with h/o cva He refuses to quit smoking        Respiratory   RESOLVED: Carcinoma of vocal cord ([facility])    Pt has no complaints  No longer sees oncology       COPD (chronic obstructive pulmonary disease) ([place])    Doing well overall  Still smokes - he is certain he will never quit  No breathing problems lately -also does well with the heat Will continue to follow       MALIGNANT NEOPLASM OF SUPRAGLOTTIS    Doing well /no complaints  He no longer sees oncology Refuses to quit smoking        Musculoskeletal and Integument   Osteopenia    No new falls or fx  Rev last dexa Not interested in further eval/tx  On ca and vit D  Active         Genitourinary   BPH (benign prostatic hyperplasia)    Stable  Nocturia times 2 , occ 3  Tolerating well Did not like  flomax Declines further tx  Lab Results  Component Value Date   PSA 0.54 01/05/2018   PSA 0.36 12/10/2014   PSA 0.48 07/11/2011           Other   Hyperlipidemia    Disc goals for lipids and reasons to control them Rev last labs with pt Rev low sat fat diet in detail LDL is very well controlled  HDL chronically very low - enc omega 3  Continue crestor three times weekly for vascular dz       Routine general medical examination at a health care facility - Primary    Reviewed health habits including diet and exercise and skin cancer prevention Reviewed appropriate screening tests for age  Also reviewed health mt list, fam hx and immunization status , as well as social and family history   See HPI Labs rev  amw rev  Disc wt loss- very busy working-needs to make an effort to get more protein calories Marcelina Morel snacks Enc smoking cessation-he does not want to quit at this time (copd stable)  Enc him to get a flu shot in the fall  Also to continue vitamin D - he declines further eval/tx of low bone density       TOBACCO ABUSE    Disc in detail risks of smoking and possible outcomes including copd, vascular/ heart disease, cancer , respiratory and sinus infections  Pt voices understanding  Unable to quit in the past Not interested in cessation at this time  Copd is stable H/o vasc dz -stable also        Other Visit Diagnoses    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

## 2018-01-12 NOTE — Assessment & Plan Note (Signed)
Stable  Nocturia times 2 , occ 3  Tolerating well Did not like flomax Declines further tx  Lab Results  Component Value Date   PSA 0.54 01/05/2018   PSA 0.36 12/10/2014   PSA 0.48 07/11/2011

## 2018-01-12 NOTE — Assessment & Plan Note (Addendum)
Doing well /no complaints  He no longer sees oncology Refuses to quit smoking

## 2018-01-12 NOTE — Assessment & Plan Note (Signed)
Pt has no complaints  No longer sees oncology

## 2018-01-12 NOTE — Assessment & Plan Note (Signed)
Doing well overall  Still smokes - he is certain he will never quit  No breathing problems lately -also does well with the heat Will continue to follow

## 2018-01-12 NOTE — Assessment & Plan Note (Signed)
Continues cardiology/vascular f/u for this and carotid dz and afib with h/o cva He refuses to quit smoking

## 2018-01-12 NOTE — Assessment & Plan Note (Signed)
Disc in detail risks of smoking and possible outcomes including copd, vascular/ heart disease, cancer , respiratory and sinus infections  Pt voices understanding  Unable to quit in the past Not interested in cessation at this time  Copd is stable H/o vasc dz -stable also

## 2018-01-12 NOTE — Assessment & Plan Note (Signed)
Disc goals for lipids and reasons to control them Rev last labs with pt Rev low sat fat diet in detail LDL is very well controlled  HDL chronically very low - enc omega 3  Continue crestor three times weekly for vascular dz

## 2018-01-12 NOTE — Assessment & Plan Note (Signed)
Reviewed health habits including diet and exercise and skin cancer prevention Reviewed appropriate screening tests for age  Also reviewed health mt list, fam hx and immunization status , as well as social and family history   See HPI Labs rev  amw rev  Disc wt loss- very busy working-needs to make an effort to get more protein calories Marcelina Morel snacks Enc smoking cessation-he does not want to quit at this time (copd stable)  Enc him to get a flu shot in the fall  Also to continue vitamin D - he declines further eval/tx of low bone density

## 2018-01-12 NOTE — Assessment & Plan Note (Signed)
No new falls or fx  Rev last dexa Not interested in further eval/tx  On ca and vit D  Active

## 2018-01-22 ENCOUNTER — Other Ambulatory Visit: Payer: Self-pay | Admitting: Physician Assistant

## 2018-01-23 ENCOUNTER — Telehealth: Payer: Self-pay

## 2018-01-23 ENCOUNTER — Other Ambulatory Visit: Payer: Self-pay

## 2018-01-23 MED ORDER — ROSUVASTATIN CALCIUM 5 MG PO TABS: 5.0000 mg | ORAL_TABLET | ORAL | 1 refills | Status: DC

## 2018-01-23 NOTE — Telephone Encounter (Signed)
Pt requested script be sent to [facility] home delivery please address thank you.  ELIQUIS 2.5 mg

## 2018-01-26 ENCOUNTER — Other Ambulatory Visit: Payer: Self-pay | Admitting: Cardiology

## 2018-01-26 MED ORDER — ELIQUIS 2.5 MG PO TABS: 2.5000 mg | ORAL_TABLET | Freq: Two times a day (BID) | ORAL | 1 refills | Status: DC

## 2018-01-26 NOTE — Telephone Encounter (Signed)
Rx sent to [facility] as requested. Pt's wife notified. She states that pt only has a few days left. Advised I could send a short supply to [facility] in case with holiday weekend. She declines. Advised to call on call should they run out over weekend. She states understanding and appreciation.

## 2018-01-26 NOTE — Telephone Encounter (Signed)
. °*  STAT* If patient is at the pharmacy, call can be transferred to refill team.   1. Which medications need to be refilled? (please list name of each medication and dose if known) Eliquis-please call it in today and please let pt know when you do this please  2. Which pharmacy/location (including street and city if local pharmacy) is medication to be sent to?[facility]  3. Do they need a 30 day or 90 day supply? 180 and refills

## 2018-02-02 ENCOUNTER — Telehealth: Payer: Self-pay | Admitting: Gastroenterology

## 2018-02-02 NOTE — Telephone Encounter (Signed)
Patient came in today stating he's got "on fire hemorrhoids" and wants to be seen. Recall in system with Dr.Armbruster but last seen by Dr.Kaplan. Patient wanting advice since first ov is in Oct. Please advise.

## 2018-02-02 NOTE — Telephone Encounter (Signed)
Left message for patient to call back, will see about getting him in to see an APP sooner.

## 2018-02-05 ENCOUNTER — Telehealth: Payer: Self-pay

## 2018-02-05 NOTE — Telephone Encounter (Signed)
Patient advised of appointment with APP on 9/16.

## 2018-02-12 ENCOUNTER — Ambulatory Visit: Payer: Medicare HMO | Admitting: Nurse Practitioner

## 2018-02-12 ENCOUNTER — Encounter: Payer: Self-pay | Admitting: Nurse Practitioner

## 2018-02-12 VITALS — BP 106/68 | HR 104 | Ht 68.0 in | Wt 121.0 lb

## 2018-02-12 DIAGNOSIS — K648 Other hemorrhoids: Secondary | ICD-10-CM

## 2018-02-12 NOTE — Progress Notes (Signed)
GI Provider: Previously Dr. Deatra Ina           Chief Complaint: Hemorrhoids  Referring Provider:     Self-referred   ASSESSMENT AND PLAN;   84 year old male here with anorectal burning.  He is already feeling better after starting stool softeners for constipation a few days ago. No rectal bleeding. Inflamed internal hemorrhoids on anoscopy.  -Continue stool softeners.  Contact us for recurrent constipation as this will certainly aggravate the hemorrhoids again -He has been using Preparation H but applying it externally which is not helping the internal hemorrhoids.  Advised to continue Preparation H but to apply inside the rectum using the applicator tip.  He should do this for the next 7 nights.  Hopefully between the Preparation H and stool softeners patient will have no further hemorrhoid burning -If hemorrhoidal problems persist/get worse we could entertain banding but Eliquis would need to be held.    HPI:     Patient is an 84 year old male with COPD and ongoing smoking, BPH, hx of CVA, and chronic AFIB on Eliquis.  He comes in today with complaints of hemorrhoidal burning.  He has been constipated but since starting stool softeners a few days ago constipation has resolved.  He has not had any rectal bleeding and the burning is already better after treatment of constipation . He uses Preparation H as needed but only externally.  Overall his weight is down approximately 5 pounds since last November but he has remained around 121 pounds for the last several months.   Colonoscopy in 2010 - No recall due to age 96 mm sessile "polyp" removed from the sigmoid colon. Bx c/w with food debris internal hemorrhoids Sigmoid diverticulosis.   Past Medical History:  Diagnosis Date  . Carcinoma of vocal cord [facility]) 2008   radiation therapy  . Carotid artery occlusion   . COPD (chronic obstructive pulmonary disease) ([place])   . Fall due to ice or snow Feb. 22, [name], declines further dexa or tx   . Smoker   . Stroke [facility])      Past Surgical History:  Procedure Laterality Date  . BONE MARROW BIOPSY  05/2003  . CARDIOVERSION N/A 03/22/2017   Procedure: CARDIOVERSION;  Surgeon: Acie Fredrickson Wonda Cheng, MD;  Location: [place];  Service: Cardiovascular;  Laterality: N/A;  . CATARACT EXTRACTION    . Dexa     per patient ? osteopenia  . HEMORRHOID SURGERY    . JOINT REPLACEMENT Right Feb. 22, 2015   Partial Hip  . SPLENECTOMY    . TONSILLECTOMY AND ADENOIDECTOMY Bilateral age 89 per patient   Family History  Problem Relation Age of Onset  . Uterine cancer Mother   . Heart disease Sister        some heart trouble  . Colon cancer Neg Hx    Social History   Tobacco Use  . Smoking status: Current Every Day Smoker    Packs/day: 0.50    Types: Cigarettes  . Smokeless tobacco: Never Used  Substance Use Topics  . Alcohol use: No    Alcohol/week: 0.0 standard drinks  . Drug use: No   Current Outpatient Medications  Medication Sig Dispense Refill  . calcium carbonate (TUMS EX) 750 MG chewable tablet Chew 1  tablet by mouth daily. Reported on 09/15/2015    . cholecalciferol (VITAMIN D) 1000 UNITS tablet Take 1,000 Units by mouth daily. Reported on 09/15/2015    . ELIQUIS 2.5 MG TABS tablet Take 1 tablet (2.5 mg total) by mouth 2 (two) times daily. 180 tablet 1  . rosuvastatin (CRESTOR) 5 MG tablet Take 1 tablet (5 mg total) by mouth 3 (three) times a week. Take on Monday, Wednesday, and Fridays. 45 tablet 1   No current facility-administered medications for this visit.    No Known Allergies   Review of Systems: All systems reviewed and negative except where noted in HPI.   Creatinine clearance cannot be calculated (Patient's most recent lab result is older than the maximum 21 days allowed.)   Physical Exam:    Wt Readings from  Last 3 Encounters:  02/12/18 121 lb (54.9 kg)  01/12/18 121 lb 8 oz (55.1 kg)  01/05/18 123 lb (55.8 kg)    BP 106/68   Pulse (!) 104   Ht '5\' 8"'  (1.727 m)   Wt 121 lb (54.9 kg)   BMI 18.40 kg/m   Constitutional:  Pleasant thin male in no acute distress. Psychiatric: Normal mood and affect. Behavior is normal. EENT: Pupils normal.  Conjunctivae are normal. No scleral icterus. Neck supple.  Cardiovascular: Normal rate, regular rhythm. No edema Pulmonary/chest: Effort normal and breath sounds normal.  Abdominal: Soft, nondistended, nontender. Bowel sounds active throughout. There are no masses palpable. No hepatomegaly. Rectal: two small protruding internal hemorrhoids. On anoscopy there was a large, inflamed internal hemorrhoid Neurological: Alert and oriented to person place and time. Skin: Skin is warm and dry. No rashes noted.  Tye Savoy, NP  02/12/2018, 3:38 PM

## 2018-02-12 NOTE — Progress Notes (Signed)
Thank you for sending this case to me. I have reviewed the entire note, and the outlined plan seems appropriate.   Henry Danis, MD  

## 2018-02-12 NOTE — Patient Instructions (Addendum)
If you are age 82 or older, your body mass index should be between 23-30. Your Body mass index is 18.4 kg/m. If this is out of the aforementioned range listed, please consider follow up with your Primary Care Provider.  If you are age 91 or younger, your body mass index should be between 19-25. Your Body mass index is 18.4 kg/m. If this is out of the aformentioned range listed, please consider follow up with your Primary Care Provider.   Use Preparation H - use with applicator tip - insert inside rectum.  Use at bedtime for seven days.  Continue Stool Softener.    Call if continued problems.  Thank you for choosing me and [facility].   Tye Savoy, NP

## 2018-04-04 ENCOUNTER — Encounter: Payer: Self-pay | Admitting: Physician Assistant

## 2018-04-04 NOTE — Progress Notes (Signed)
Cardiology Office Note    Date:  04/05/2018  ID:  [name], DOB 1931-08-14, MRN [id number] PCP:  Abner Greenspan, MD  Cardiologist:  Ena Dawley, MD   Chief Complaint: f/u afib  History of Present Illness:  Daniel Chapman is a 84 y.o. male with history of chronic atrial fibrillation, HLD, chronic dyspnea from COPD/ongoing tobacco use, vocal cord cancer, ITP (S/p splenectomy), pulm nodules, CVA, carotid artery disease (occ RICA, 1-39% L, occ prox LECA followed by [facility]) who presents for overdue follow-up. He was diagnosed with atrial fib in 01/2017 after admission for stroke. He was placed on [place] and underwent DCCV 4 weeks later. However, atrial fib has since recurred and managed with rate-control strategy. 2D echo 01/2017 showed EF 65-70%, mild MR. Statin was previously decreased due to memory issues. Last labs 12/2017 LDL 53 (PCP), TSH wnl, Hgb 13.0, K 4.8, Cr 0.87, LFTs OK, glucose 100. He last saw Dr. Meda Coffee in 05/2017.  He presents back for follow-up with his wife. 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 He jokes that he suffers from "CRS" (a euphemism for can't remember junk). He often forgets what he came into a room for but otherwise is A+Ox3. Wife assists with his care. He is totally unaware of his afib. No CP. Chronic DOE unchanged. Wife is concerned because over the last 2 months he's been sleeping more overall, and sometimes skips lunch.    Past Medical History:  Diagnosis Date  . Carcinoma of vocal cord [facility]) 2008   radiation therapy  . Carotid artery occlusion    a. 09/2017 occ RICA, 1-39% L, occ prox LECA followed by [facility].  . Chronic atrial fibrillation   . COPD (chronic obstructive pulmonary disease) ([place])   . Fall due to ice or snow Feb. 22, 2015   Partial Right Hip replaced  . History of ITP   . Hx of colonic polyps   . Hyperlipemia   . Osteopenia    ?  Marland Kitchen Osteoporosis    dexa 06, declines further dexa or tx   . Smoker   . Stroke  [facility])     Past Surgical History:  Procedure Laterality Date  . BONE MARROW BIOPSY  05/2003  . CARDIOVERSION N/A 03/22/2017   Procedure: CARDIOVERSION;  Surgeon: Acie Fredrickson Wonda Cheng, MD;  Location: [place];  Service: Cardiovascular;  Laterality: N/A;  . CATARACT EXTRACTION    . Dexa     per patient ? osteopenia  . HEMORRHOID SURGERY    . JOINT REPLACEMENT Right Feb. 22, 2015   Partial Hip  . SPLENECTOMY    . TONSILLECTOMY AND ADENOIDECTOMY Bilateral age 70 per patient    Current Medications: Current Meds  Medication Sig  . calcium carbonate (TUMS EX) 750 MG chewable tablet Chew 1 tablet by mouth daily. Reported on 09/15/2015  . cholecalciferol (VITAMIN D) 1000 UNITS tablet Take 1,000 Units by mouth daily. Reported on 09/15/2015  . ELIQUIS 2.5 MG TABS tablet Take 1 tablet (2.5 mg total) by mouth 2 (two) times daily.  . rosuvastatin (CRESTOR) 5 MG tablet Take 1 tablet (5 mg total) by mouth 3 (three) times a week. Take on Monday, Wednesday, and Fridays.      Allergies:   Patient has no known allergies.   Social History   Socioeconomic History  . Marital status: Married    Spouse name: Not on file  . Number of children: 6  . Years of education:  Not on file  . Highest education level: Not on file  Occupational History  . Occupation: Biochemist, clinical: SELF-EMPLOYED  Social Needs  . Financial resource strain: Not on file  . Food insecurity:    Worry: Not on file    Inability: Not on file  . Transportation needs:    Medical: Not on file    Non-medical: Not on file  Tobacco Use  . Smoking status: Current Every Day Smoker    Packs/day: 0.50    Types: Cigarettes  . Smokeless tobacco: Never Used  Substance and Sexual Activity  . Alcohol use: No    Alcohol/week: 0.0 standard drinks  . Drug use: No  . Sexual activity: Not Currently  Lifestyle  . Physical activity:    Days per week: Not on file    Minutes per session: Not on file  . Stress: Not on file    Relationships  . Social connections:    Talks on phone: Not on file    Gets together: Not on file    Attends religious service: Not on file    Active member of club or organization: Not on file    Attends meetings of clubs or organizations: Not on file    Relationship status: Not on file  Other Topics Concern  . Not on file  Social History Narrative  . Not on file     Family History:  The patient's family history includes Heart disease in his sister; Uterine cancer in his mother. There is no history of Colon cancer.  ROS:   Please see the history of present illness.  All other systems are reviewed and otherwise negative.    PHYSICAL EXAM:   VS:  BP 120/60   Pulse 78   Ht '5\' 7"'  (1.702 m)   Wt 121 lb 12.8 oz (55.2 kg)   SpO2 99%   BMI 19.08 kg/m   BMI: Body mass index is 19.08 kg/m. GEN: Well nourished, well developed WM, in no acute distress HEENT: normocephalic, atraumatic, poor dentition Neck: no JVD, carotid bruits, or masses Cardiac: irregularly irregular, rate controlled; no murmurs, rubs, or gallops, no edema  Respiratory:  clear to auscultation bilaterally, normal work of breathing GI: soft, nontender, nondistended, + BS MS: no deformity or atrophy Skin: warm and dry, no rash Neuro:  Alert and Oriented x 3, Strength and sensation are intact, follows commands Psych: euthymic mood, full affect  Wt Readings from Last 3 Encounters:  04/05/18 121 lb 12.8 oz (55.2 kg)  02/12/18 121 lb (54.9 kg)  01/12/18 121 lb 8 oz (55.1 kg)      Studies/Labs Reviewed:   EKG:  EKG was ordered today and personally reviewed by me and demonstrates atrial fibrillation, rate controlled, 68bpm, no acute changes.  Recent Labs: 01/05/2018: ALT 7; BUN 18; Creatinine, Ser 0.87; Hemoglobin 13.0; Platelets 212.0; Potassium 4.8; Sodium 140; TSH 1.22   Lipid Panel    Component Value Date/Time   CHOL 100 01/05/2018 0937   CHOL 108 04/10/2017 0811   TRIG 94.0 01/05/2018 0937   HDL 28.50  (L) 01/05/2018 0937   HDL 32 (L) 04/10/2017 0811   CHOLHDL 4 01/05/2018 0937   VLDL 18.8 01/05/2018 0937   LDLCALC 53 01/05/2018 0937   LDLCALC 57 04/10/2017 0811    Additional studies/ records that were reviewed today include: Summarized above.  ASSESSMENT & PLAN:   1. Chronic atrial fibrillation - rate controlled without any AVN blocking agents. No bleeding reported  on Eliquis. Continue current dose. Wife states their mail order was supposed to auto-refill but did not. She spoke with them yesterday to get back on track but he currently has 4 days left. Samples were provided to bridge the next few days until this arrives. 2. Hyperlipidemia - followed by PCP, recent LDL this year was great. 3. Carotid artery disease - followed by vascular surgery. 4. Tobacco abuse - patient has absolutely no desire to quit at this point despite counseling on cessation. 5. Fatigue - I wonder if this is sequelae of his memory issues with short term memory loss, decreasing appetite, and sleeping more. O2 and BP are normal. Will update labs to exclude obvious metabolic abnormality. He does not snore and wife denies any witnessed apneas. If labs unremarkable would advise he discuss further with primary care.  Disposition: F/u with Dr. Meda Coffee in 6 months.   Medication Adjustments/Labs and Tests Ordered: Current medicines are reviewed at length with the patient today.  Concerns regarding medicines are outlined above. Medication changes, Labs and Tests ordered today are summarized above and listed in the Patient Instructions accessible in Encounters.   Signed, Charlie Pitter, PA-C  04/05/2018 9:31 AM    [address], [place], [place]  [address] Phone: [phone]; Fax: [phone]

## 2018-04-05 ENCOUNTER — Encounter: Payer: Self-pay | Admitting: Physician Assistant

## 2018-04-05 ENCOUNTER — Ambulatory Visit: Payer: Medicare HMO | Admitting: Physician Assistant

## 2018-04-05 VITALS — BP 120/60 | HR 78 | Ht 67.0 in | Wt 121.8 lb

## 2018-04-05 DIAGNOSIS — Z720 Tobacco use: Secondary | ICD-10-CM

## 2018-04-05 DIAGNOSIS — I482 Chronic atrial fibrillation, unspecified: Secondary | ICD-10-CM | POA: Diagnosis not present

## 2018-04-05 DIAGNOSIS — R5383 Other fatigue: Secondary | ICD-10-CM

## 2018-04-05 DIAGNOSIS — I6521 Occlusion and stenosis of right carotid artery: Secondary | ICD-10-CM

## 2018-04-05 DIAGNOSIS — E785 Hyperlipidemia, unspecified: Secondary | ICD-10-CM

## 2018-04-05 LAB — TSH: TSH: 1.41 u[IU]/mL (ref 0.450–4.500)

## 2018-04-05 LAB — BASIC METABOLIC PANEL
BUN/Creatinine Ratio: 18 (ref 10–24)
BUN: 15 mg/dL (ref 8–27)
CO2: 23 mmol/L (ref 20–29)
Calcium: 9 mg/dL (ref 8.6–10.2)
Chloride: 104 mmol/L (ref 96–106)
Creatinine, Ser: 0.82 mg/dL (ref 0.76–1.27)
GFR, EST AFRICAN AMERICAN: 94 mL/min/{1.73_m2} (ref >59)
GFR, EST NON AFRICAN AMERICAN: 81 mL/min/{1.73_m2} (ref >59)
Glucose: 89 mg/dL (ref 65–99)
POTASSIUM: 4.3 mmol/L (ref 3.5–5.2)
SODIUM: 143 mmol/L (ref 134–144)

## 2018-04-05 LAB — CBC
HEMATOCRIT: 37.2 % — AB (ref 37.5–51.0)
Hemoglobin: 12.4 g/dL — ABNORMAL LOW (ref 13.0–17.7)
MCH: 30.4 pg (ref 26.6–33.0)
MCHC: 33.3 g/dL (ref 31.5–35.7)
MCV: 91 fL (ref 79–97)
Platelets: 211 10*3/uL (ref 150–450)
RBC: 4.08 x10E6/uL — ABNORMAL LOW (ref 4.14–5.80)
RDW: 13.9 % (ref 12.3–15.4)
WBC: 8 10*3/uL (ref 3.4–10.8)

## 2018-04-05 NOTE — Patient Instructions (Addendum)
Medication Instructions:  Your physician recommends that you continue on your current medications as directed. Please refer to the Current Medication list given to you today.  If you need a refill on your cardiac medications before your next appointment, please call your pharmacy.   Lab work: TODAY:  BMET, CBC, & TSH  If you have labs (blood work) drawn today and your tests are completely normal, you will receive your results only by: Marland Kitchen MyChart Message (if you have MyChart) OR . A paper copy in the mail If you have any lab test that is abnormal or we need to change your treatment, we will call you to review the results.  Testing/Procedures: None ordered  Follow-Up: At [facility], you and your health needs are our priority.  As part of our continuing mission to provide you with exceptional heart care, we have created designated Provider Care Teams.  These Care Teams include your primary Cardiologist (physician) and Advanced Practice Providers (APPs -  Physician Assistants and Nurse Practitioners) who all work together to provide you with the care you need, when you need it. You will need a follow up appointment in 6 months.  Please call our office 2 months in advance to schedule this appointment.  You may see Ena Dawley, MD or one of the following Advanced Practice Providers on your designated Care Team:   [employer], PA-C Melina Copa, PA-C . Ermalinda Barrios, PA-C  Any Other Special Instructions Will Be Listed Below (If Applicable).

## 2018-04-23 ENCOUNTER — Ambulatory Visit (INDEPENDENT_AMBULATORY_CARE_PROVIDER_SITE_OTHER): Payer: Medicare HMO | Admitting: Family Medicine

## 2018-04-23 ENCOUNTER — Encounter: Payer: Self-pay | Admitting: Family Medicine

## 2018-04-23 VITALS — BP 100/62 | HR 78 | Temp 97.5°F | Ht 67.0 in | Wt 121.8 lb

## 2018-04-23 DIAGNOSIS — R69 Illness, unspecified: Secondary | ICD-10-CM | POA: Diagnosis not present

## 2018-04-23 DIAGNOSIS — F0390 Unspecified dementia without behavioral disturbance: Secondary | ICD-10-CM | POA: Insufficient documentation

## 2018-04-23 DIAGNOSIS — F17200 Nicotine dependence, unspecified, uncomplicated: Secondary | ICD-10-CM | POA: Diagnosis not present

## 2018-04-23 DIAGNOSIS — I4891 Unspecified atrial fibrillation: Secondary | ICD-10-CM | POA: Diagnosis not present

## 2018-04-23 DIAGNOSIS — Z23 Encounter for immunization: Secondary | ICD-10-CM | POA: Diagnosis not present

## 2018-04-23 DIAGNOSIS — G3184 Mild cognitive impairment, so stated: Secondary | ICD-10-CM

## 2018-04-23 DIAGNOSIS — Z7689 Persons encountering health services in other specified circumstances: Secondary | ICD-10-CM | POA: Insufficient documentation

## 2018-04-23 MED ORDER — DONEPEZIL HCL 5 MG PO TBDP: 5.0000 mg | ORAL_TABLET | Freq: Every day | ORAL | 5 refills | Status: DC

## 2018-04-23 NOTE — Patient Instructions (Addendum)
We will work on getting you established with a new cardiologist  Someone from this office will call you   For memory (to slow down memory loss) - try aricept 5 mg each bedtime If any side effects- stop it and call us  If doing well -we can advance dose later to 10 mg   If irritability or mood get worse and you want to try medicine for that please let me know   I think you are sleeping more due to multiple factors including age/ copd and smoking/ mood change (depression/anxiety)

## 2018-04-23 NOTE — Assessment & Plan Note (Signed)
Sleeping more-taking frequent naps during the day  Pt sleeps in short periods at night (does have to get up to urinate)  Denies snoring or sleep apnea symptoms Suspect this is multifactorial  Age/copd/pm sleep habits may play a role  Also depression /anxiety (he adamantly refuses tx of any kind)  Also notes some cognitive slowing  Disc in detail with pt and wife

## 2018-04-23 NOTE — Progress Notes (Signed)
Subjective:    Patient ID: Daniel Chapman, male    DOB: [birth year], 84 y.o.   MRN: [id number]  HPI Here with sleeping issues/ follow up from cardiology   Wt Readings from Last 3 Encounters:  04/23/18 121 lb 12 oz (55.2 kg)  04/05/18 121 lb 12.8 oz (55.2 kg)  02/12/18 121 lb (54.9 kg)   19.07 kg/m   Had cardiology visit 11/7 Has hx of a fib and CVA and carotid dz  afib is rate controlled and chronic   Noted he has short term memory loss/ decreasing appetite and sleeping more  02 and BP were ok at his visit there  Denied snoring or witnessed apnea when sleeping  Takes several naps and goes back to bed after breakfast  Goes to bed around 9:30  Gets up at least twice  Then gets up 2-4 am    And reads until around 4:20  Then gets up around 6-7:00 Never has slept very much   Mood - much more irritable (agitated)  Impatient and short fuse  Gets flustered easily - does not think as clearly since his CVA No sadness or tearfulness     Labs done most recently: Lab Results  Component Value Date   CREATININE 0.82 04/05/2018   BUN 15 04/05/2018   NA 143 04/05/2018   K 4.3 04/05/2018   CL 104 04/05/2018   CO2 23 04/05/2018   Lab Results  Component Value Date   WBC 8.0 04/05/2018   HGB 12.4 (L) 04/05/2018   HCT 37.2 (L) 04/05/2018   MCV 91 04/05/2018   PLT 211 04/05/2018   (Hb was down from 13)   Lab Results  Component Value Date   ALT 7 01/05/2018   AST 14 01/05/2018   ALKPHOS 52 01/05/2018   BILITOT 0.6 01/05/2018   glucose 89 Lab Results  Component Value Date   TSH 1.410 04/05/2018    Lab Results  Component Value Date   PSA 0.54 01/05/2018   PSA 0.36 12/10/2014   PSA 0.48 07/11/2011    Blood pressure BP Readings from Last 3 Encounters:  04/23/18 100/62  04/05/18 120/60  02/12/18 106/68   Pulse Readings from Last 3 Encounters:  04/23/18 78  04/05/18 78  02/12/18 (!) 104   Pulse ox 100 % RA today   Wt Readings from Last 3 Encounters:  04/23/18  121 lb 12 oz (55.2 kg)  04/05/18 121 lb 12.8 oz (55.2 kg)  02/12/18 121 lb (54.9 kg)   19.07 kg/m   Last chest CT 9/18  IMPRESSION: 1. Extensive scarring in the lung apices, similar to prior studies. 2. There are multiple tiny pulmonary nodules scattered throughout the lungs bilaterally, generally similar to prior study from 2011, considered benign. There are few exceptions which are new, measuring 5 mm or less in size. These are nonspecific, but likely benign. No follow-up needed if patient is low-risk (and has no known or suspected primary neoplasm). Non-contrast chest CT can be considered in 12 months if patient is high-risk. This recommendation follows the consensus statement: Guidelines for Management of Incidental Pulmonary Nodules Detected on CT Images: From the Fleischner Society 2017; Radiology 2017; 284:228-24. 3. Aortic atherosclerosis. 4. Diffuse bronchial wall thickening with moderate to severe centrilobular and paraseptal emphysema; imaging findings suggestive of underlying COPD.  Aortic Atherosclerosis (ICD10-I70.0) and Emphysema (ICD10-J43.9).  Patient Active Problem List   Diagnosis Date Noted  . MCI (mild cognitive impairment) 04/23/2018  . Sleep concern 04/23/2018  . Urinary  frequency 10/11/2017  . BPH (benign prostatic hyperplasia) 10/11/2017  . Chronic anticoagulation 06/29/2017  . Carotid occlusion, right   . H/O: stroke 02/06/2017  . Atrial fibrillation, new onset ([place]) 02/06/2017  . Encounter for Medicare annual wellness exam 12/17/2014  . Special screening for malignant neoplasms, colon 12/17/2014  . Routine general medical examination at a health care facility 12/09/2014  . Aneurysm of abdominal vessel ([place]) 09/11/2013  . S/P total hip arthroplasty 08/02/2013  . H/O splenectomy 08/02/2013  . History of hip fracture 07/20/2013  . Occlusion and stenosis of carotid artery without mention of cerebral infarction 08/28/2012  . Carotid stenosis  08/14/2012  . Bruit of left carotid artery 08/01/2012  . Anxiety 01/27/2012  . Osteopenia 07/19/2011  . HOARSENESS, CHRONIC 02/05/2007  . 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 Hyperlipidemia 11/06/2006  . TOBACCO ABUSE 11/06/2006  . COPD (chronic obstructive pulmonary disease) ([place]) 11/06/2006  . COLONIC POLYPS, HX OF 11/06/2006  . PROSTATITIS, HX OF 11/06/2006   Past Medical History:  Diagnosis Date  . Carcinoma of vocal cord [facility]) 2008   radiation therapy  . Carotid artery occlusion    a. 09/2017 occ RICA, 1-39% L, occ prox LECA followed by [facility].  . Chronic atrial fibrillation   . COPD (chronic obstructive pulmonary disease) ([place])   . Fall due to ice or snow Feb. 22, 2015   Partial Right Hip replaced  . History of ITP   . Hx of colonic polyps   . Hyperlipemia   . Osteopenia    ?  Marland Kitchen Osteoporosis    dexa 06, declines further dexa or tx   . Smoker   . Stroke [facility])    Past Surgical History:  Procedure Laterality Date  . BONE MARROW BIOPSY  05/2003  . CARDIOVERSION N/A 03/22/2017   Procedure: CARDIOVERSION;  Surgeon: Acie Fredrickson Wonda Cheng, MD;  Location: [place];  Service: Cardiovascular;  Laterality: N/A;  . CATARACT EXTRACTION    . Dexa     per patient ? osteopenia  . HEMORRHOID SURGERY    . JOINT REPLACEMENT Right Feb. 22, 2015   Partial Hip  . SPLENECTOMY    . TONSILLECTOMY AND ADENOIDECTOMY Bilateral age 33 per patient   Social History   Tobacco Use  . Smoking status: Current Every Day Smoker    Packs/day: 0.50    Types: Cigarettes  . Smokeless tobacco: Never Used  Substance Use Topics  . Alcohol use: No    Alcohol/week: 0.0 standard drinks  . Drug use: No   Family History  Problem Relation Age of Onset  . Uterine cancer Mother   . Heart disease Sister        some heart trouble  . Colon cancer Neg Hx    No Known Allergies Current Outpatient Medications on File Prior to Visit  Medication Sig Dispense Refill  . calcium carbonate (TUMS EX)  750 MG chewable tablet Chew 1 tablet by mouth daily. Reported on 09/15/2015    . cholecalciferol (VITAMIN D) 1000 UNITS tablet Take 1,000 Units by mouth daily. Reported on 09/15/2015    . ELIQUIS 2.5 MG TABS tablet Take 1 tablet (2.5 mg total) by mouth 2 (two) times daily. 180 tablet 1  . rosuvastatin (CRESTOR) 5 MG tablet Take 1 tablet (5 mg total) by mouth 3 (three) times a week. Take on Monday, Wednesday, and Fridays. 45 tablet 1   No current facility-administered medications on file prior to visit.      Review of Systems  Constitutional: Negative for activity change, appetite change, fatigue, fever and unexpected weight change.  HENT: Negative for congestion, rhinorrhea, sore throat and trouble swallowing.   Eyes: Negative for pain, redness, itching and visual disturbance.  Respiratory: Negative for cough, chest tightness, shortness of breath and wheezing.   Cardiovascular: Negative for chest pain and palpitations.  Gastrointestinal: Negative for abdominal pain, blood in stool, constipation, diarrhea and nausea.  Endocrine: Negative for cold intolerance, heat intolerance, polydipsia and polyuria.  Genitourinary: Negative for difficulty urinating, dysuria, frequency and urgency.  Musculoskeletal: Negative for arthralgias, joint swelling and myalgias.  Skin: Negative for pallor and rash.  Neurological: Negative for dizziness, tremors, weakness, numbness and headaches.  Hematological: Negative for adenopathy. Does not bruise/bleed easily.  Psychiatric/Behavioral: Positive for confusion, dysphoric mood and sleep disturbance. Negative for decreased concentration, hallucinations, self-injury and suicidal ideas. The patient is nervous/anxious.        Objective:   Physical Exam  Constitutional: He appears well-developed and well-nourished. No distress.  Slim and well appearing   HENT:  Head: Normocephalic and atraumatic.  Mouth/Throat: Oropharynx is clear and moist.  Missing many teeth    Eyes: Pupils are equal, round, and reactive to light. Conjunctivae and EOM are normal. No scleral icterus.  Neck: Normal range of motion. Neck supple. No JVD present. Carotid bruit is not present. No tracheal deviation present. No thyromegaly present.  Cardiovascular: Normal rate, normal heart sounds and intact distal pulses. Exam reveals no gallop.  irreg rhythm - a fib  Pulmonary/Chest: Effort normal and breath sounds normal. No respiratory distress. He has no wheezes. He has no rales. He exhibits no tenderness.  No crackles  Diffusely distant bs No wheezing today  Abdominal: Soft. Bowel sounds are normal. He exhibits no distension, no abdominal bruit and no mass. There is no tenderness.  Musculoskeletal: He exhibits no edema or deformity.  Lymphadenopathy:    He has no cervical adenopathy.  Neurological: He is alert. He has normal reflexes. He displays normal reflexes. No cranial nerve deficit. He exhibits normal muscle tone. Coordination normal.  No tremor  Nl gait  Skin: Skin is warm and dry. No rash noted.  Psychiatric: He has a normal mood and affect.  General boisterous self today  Answers questions appropriately  Agrees that he is more irritable since CVA Supportive wife here today           Assessment & Plan:   Problem List Items Addressed This Visit      Cardiovascular and Mediastinum   Atrial fibrillation, new onset ([place])    No clinical changes Wishes to change providers Ref done Rosaria Ferries out today so office will call him)  Not due for visit for 6 mo       Relevant Orders   Ambulatory referral to Cardiology     Other   TOBACCO ABUSE    Disc in detail risks of smoking and possible outcomes including copd, vascular/ heart disease, cancer , respiratory and sinus infections  Pt voices understanding He is not interested in quitting       Sleep concern    Sleeping more-taking frequent naps during the day  Pt sleeps in short periods at night (does have to get  up to urinate)  Denies snoring or sleep apnea symptoms Suspect this is multifactorial  Age/copd/pm sleep habits may play a role  Also depression /anxiety (he adamantly refuses tx of any kind)  Also notes some cognitive slowing  Disc in detail with pt and wife  MCI (mild cognitive impairment) - Primary    More short term memory issues (also more easily confused since his CVA)- in general cognitive slowing  No more stroke events Disc tx with aricept to slow down cog change-agreed to try  Px 5 mg aricept daily  Disc poss side eff incl nausea (handout given)  If tolerated will inc to 10 mg F/u in 6 mo        Other Visit Diagnoses    Need for influenza vaccination       Relevant Orders   Flu Vaccine QUAD 6+ mos PF IM (Fluarix Quad PF) (Completed)

## 2018-04-23 NOTE — Assessment & Plan Note (Signed)
More short term memory issues (also more easily confused since his CVA)- in general cognitive slowing  No more stroke events Disc tx with aricept to slow down cog change-agreed to try  Px 5 mg aricept daily  Disc poss side eff incl nausea (handout given)  If tolerated will inc to 10 mg F/u in 6 mo

## 2018-04-23 NOTE — Assessment & Plan Note (Signed)
Disc in detail risks of smoking and possible outcomes including copd, vascular/ heart disease, cancer , respiratory and sinus infections  Pt voices understanding He is not interested in quitting

## 2018-04-23 NOTE — Assessment & Plan Note (Signed)
No clinical changes Wishes to change providers Ref done Rosaria Ferries out today so office will call him)  Not due for visit for 6 mo

## 2018-06-04 ENCOUNTER — Other Ambulatory Visit: Payer: Self-pay | Admitting: Cardiology

## 2018-06-04 MED ORDER — APIXABAN 2.5 MG PO TABS: 2.5000 mg | ORAL_TABLET | Freq: Two times a day (BID) | ORAL | 3 refills | Status: DC

## 2018-06-04 MED ORDER — ROSUVASTATIN CALCIUM 5 MG PO TABS: 5.0000 mg | ORAL_TABLET | ORAL | 3 refills | Status: DC

## 2018-06-04 NOTE — Telephone Encounter (Signed)
Pt is a 85 yr old male who saw PA on 04/05/18, SCr was 0.82. Last noted weight was 55.2Kg on 04/23/18. Will refill Eliquis 2.5mg  BID.

## 2018-08-10 DIAGNOSIS — H4323 Crystalline deposits in vitreous body, bilateral: Secondary | ICD-10-CM | POA: Diagnosis not present

## 2018-08-14 ENCOUNTER — Telehealth: Payer: Self-pay | Admitting: Cardiology

## 2018-08-14 NOTE — Telephone Encounter (Signed)
I will completely fine if he switches to anyone who is available first

## 2018-08-14 NOTE — Telephone Encounter (Signed)
Wife would like to switch providers from Dr. Meda Coffee to a provider with more availability. Wife called and said she called to start the process three weeks ago, but I do not see a note about it.

## 2018-08-14 NOTE — Telephone Encounter (Signed)
Dr. Meda Coffee, This patients wife would like to switch to the provider in office who has next availability. Please advise if switch is okay?

## 2018-08-14 NOTE — Telephone Encounter (Signed)
I am ok with that, however this is a tough situation with Covid 19, does he need to be see now? I can see him on Monday

## 2018-08-14 NOTE — Telephone Encounter (Signed)
Received call back from Ms. Kozma (pts spouse) who reports that pt has no new sx and does not want to be seen at the office at this time due to [place].  Pt's wife will call back in order to reschedule her husbands appt. Pt's wife requests a new provider as she "does not communicate well" with Dr. Meda Coffee.

## 2018-08-15 NOTE — Telephone Encounter (Signed)
AV, can you please call this pt or wife and help arrange an appt with any Provider (okayed by Dr Meda Coffee to switch), at the next available appt?

## 2018-08-15 NOTE — Telephone Encounter (Signed)
[place] with me Candee Furbish, MD

## 2018-08-15 NOTE — Telephone Encounter (Signed)
Follow up    Spoke with patient in reference to switching providers from Dr. Meda Coffee. Dr. Meda Coffee has given the okay to switch. Patient would like to switch to Dr. Marlou Porch if possible. Please advise.

## 2018-08-16 NOTE — Telephone Encounter (Signed)
Provider Switch  Received: Today  Message Contents  Lilli Few, LPN        I did get this patient moved from Dr. Meda Coffee to Dr. Marlou Porch.

## 2018-10-02 ENCOUNTER — Telehealth: Payer: Self-pay

## 2018-10-02 MED ORDER — DONEPEZIL HCL 10 MG PO TABS: 10.0000 mg | ORAL_TABLET | Freq: Every day | ORAL | 11 refills | Status: DC

## 2018-10-02 NOTE — Telephone Encounter (Signed)
Daniel Chapman said she had spoken with Dr Glori Bickers about herself and Mr Guadamuz in 08/2018 and wanted Mr Rawl donepezil increased to 10 mg;pt does not want disintegrating tablet to [facility] [address].

## 2018-10-02 NOTE — Telephone Encounter (Signed)
Sent. Thanks.   

## 2018-10-02 NOTE — Telephone Encounter (Signed)
Noted. Patient's wife pick up Rx.

## 2018-10-09 ENCOUNTER — Other Ambulatory Visit: Payer: Self-pay

## 2018-10-09 ENCOUNTER — Encounter: Payer: Self-pay | Admitting: Cardiology

## 2018-10-09 ENCOUNTER — Ambulatory Visit: Payer: Medicare Other | Admitting: Cardiology

## 2018-10-09 VITALS — BP 126/86 | HR 62 | Ht 67.0 in | Wt 122.2 lb

## 2018-10-09 DIAGNOSIS — I482 Chronic atrial fibrillation, unspecified: Secondary | ICD-10-CM

## 2018-10-09 DIAGNOSIS — Z7901 Long term (current) use of anticoagulants: Secondary | ICD-10-CM

## 2018-10-09 DIAGNOSIS — K649 Unspecified hemorrhoids: Secondary | ICD-10-CM | POA: Diagnosis not present

## 2018-10-09 DIAGNOSIS — Z720 Tobacco use: Secondary | ICD-10-CM | POA: Diagnosis not present

## 2018-10-09 NOTE — Progress Notes (Signed)
Cardiology Office Note:    Date:  10/09/2018   ID:  OLUWATOSIN HIGGINSON, DOB 04/22/1932, MRN [id number]  PCP:  Abner Greenspan, MD  Cardiologist:  Candee Furbish, MD  Electrophysiologist:  None   Referring MD: Abner Greenspan, MD   No chief complaint on file. Here for follow-up of atrial fibrillation  History of Present Illness:    Daniel Chapman is a 85 y.o. 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  He went on oral anticoagulant and had DC cardioversion however it returned and he was managed with rate control strategy.  EF 65 to 70%.  Previously statin was decreased because of memory issue.  He has been having a difficult time with his hemorrhoids.  In review of prior note from Melina C[place], [place], he considers himself quite a jokester.  His main complaint today once again is his hemorrhoids.  Otherwise, he is doing well no fevers chills nausea vomiting shortness of breath.  Continues to smoke.  His wife is very patient with him.  LDL 53 on 01/05/2018, hemoglobin A1c 6.2, creatinine 0.8, TSH 1.4    Past Medical History:  Diagnosis Date  . Carcinoma of vocal cord [facility]) 2008   radiation therapy  . Carotid artery occlusion    a. 09/2017 occ RICA, 1-39% L, occ prox LECA followed by [facility].  . Chronic atrial fibrillation   . COPD (chronic obstructive pulmonary disease) ([place])   . Fall due to ice or snow Feb. 22, 2015   Partial Right Hip replaced  . History of ITP   . Hx of colonic polyps   . Hyperlipemia   . Osteopenia    ?  Marland Kitchen Osteoporosis    dexa 06, declines further dexa or tx   . Smoker   . Stroke [facility])     Past Surgical History:  Procedure Laterality Date  . BONE MARROW BIOPSY  05/2003  . CARDIOVERSION N/A 03/22/2017   Procedure:  CARDIOVERSION;  Surgeon: Acie Fredrickson Wonda Cheng, MD;  Location: [place];  Service: Cardiovascular;  Laterality: N/A;  . CATARACT EXTRACTION    . Dexa     per patient ? osteopenia  . HEMORRHOID SURGERY    . JOINT REPLACEMENT Right Feb. 22, 2015   Partial Hip  . SPLENECTOMY    . TONSILLECTOMY AND ADENOIDECTOMY Bilateral age 59 per patient    Current Medications: Current Meds  Medication Sig  . apixaban (ELIQUIS) 2.5 MG TABS tablet Take 1 tablet (2.5 mg total) by mouth 2 (two) times daily.  . calcium carbonate (TUMS EX) 750 MG chewable tablet Chew 1 tablet by mouth daily. Reported on 09/15/2015  . cholecalciferol (VITAMIN D) 1000 UNITS tablet Take 1,000 Units by mouth daily. Reported on 09/15/2015  . donepezil (ARICEPT) 10 MG tablet Take 1 tablet (10 mg total) by mouth at bedtime.  . rosuvastatin (CRESTOR) 5 MG tablet Take 1 tablet (5 mg total) by mouth 3 (three) times a week. Take on Monday, Wednesday, and Fridays.     Allergies:   Patient has no known allergies.   Social History   Socioeconomic History  . Marital status: Married    Spouse name: Not on file  . Number of children: 6  . Years of education: Not on file  . Highest education level: Not on  file  Occupational History  . Occupation: Biochemist, clinical: SELF-EMPLOYED  Social Needs  . Financial resource strain: Not on file  . Food insecurity:    Worry: Not on file    Inability: Not on file  . Transportation needs:    Medical: Not on file    Non-medical: Not on file  Tobacco Use  . Smoking status: Current Every Day Smoker    Packs/day: 0.50    Types: Cigarettes  . Smokeless tobacco: Never Used  Substance and Sexual Activity  . Alcohol use: No    Alcohol/week: 0.0 standard drinks  . Drug use: No  . Sexual activity: Not Currently  Lifestyle  . Physical activity:    Days per week: Not on file    Minutes per session: Not on file  . Stress: Not on file  Relationships  . Social connections:    Talks on  phone: Not on file    Gets together: Not on file    Attends religious service: Not on file    Active member of club or organization: Not on file    Attends meetings of clubs or organizations: Not on file    Relationship status: Not on file  Other Topics Concern  . Not on file  Social History Narrative  . Not on file     Family History: The patient's family history includes Heart disease in his sister; Uterine cancer in his mother. There is no history of Colon cancer.  ROS:   Please see the history of present illness.     All other systems reviewed and are negative.  EKGs/Labs/Other Studies Reviewed:    The following studies were reviewed today:  Echocardiogram 02/06/2017:  - Left ventricle: The cavity size was normal. Wall thickness was   normal. Systolic function was vigorous. The estimated ejection   fraction was in the range of 65% to 70%. - Aortic valve: Moderately calcified annulus. Mildly thickened   leaflets. - Mitral valve: There was mild regurgitation.  EKG: Prior EKG shows A. fib 70 personally reviewed  Recent Labs: 01/05/2018: ALT 7 04/05/2018: BUN 15; Creatinine, Ser 0.82; Hemoglobin 12.4; Platelets 211; Potassium 4.3; Sodium 143; TSH 1.410  Recent Lipid Panel    Component Value Date/Time   CHOL 100 01/05/2018 0937   CHOL 108 04/10/2017 0811   TRIG 94.0 01/05/2018 0937   HDL 28.50 (L) 01/05/2018 0937   HDL 32 (L) 04/10/2017 0811   CHOLHDL 4 01/05/2018 0937   VLDL 18.8 01/05/2018 0937   LDLCALC 53 01/05/2018 0937   LDLCALC 57 04/10/2017 0811    Physical Exam:    VS:  BP 126/86   Pulse 62   Ht '5\' 7"'  (1.702 m)   Wt 122 lb 3.2 oz (55.4 kg)   SpO2 93%   BMI 19.14 kg/m     Wt Readings from Last 3 Encounters:  10/09/18 122 lb 3.2 oz (55.4 kg)  04/23/18 121 lb 12 oz (55.2 kg)  04/05/18 121 lb 12.8 oz (55.2 kg)     GEN: Elderly, comfortable, jovial, thin  in no acute distress HEENT: Normal NECK: No JVD; No carotid bruits LYMPHATICS: No  lymphadenopathy CARDIAC: Irregularly irregular, normal rate, no murmurs, rubs, gallops RESPIRATORY:  Clear to auscultation without rales, wheezing or rhonchi  ABDOMEN: Soft, non-tender, non-distended MUSCULOSKELETAL:  No edema; No deformity  SKIN: Warm and dry NEUROLOGIC:  Alert, dementia present PSYCHIATRIC:  Normal affect   ASSESSMENT:    1. Chronic atrial fibrillation  2. Hemorrhoids, unspecified hemorrhoid type   3. Chronic anticoagulation   4. Tobacco abuse    PLAN:    In order of problems listed above:  Permanent atrial fibrillation -Continue with rate control without any AV nodal blocking agents.  Chronic anticoagulation - Eliquis.  Still having some trouble with hemorrhoids.  Refer to GI for potential banding.  Hyperlipidemia -LDL 53 excellent followed by Dr. Glori Bickers.  Currently on Crestor 5 mg.  Tobacco use -No desire to quit.  Dementia - Seems to be progressive coronaries wife.  Hemorrhoids - We will refer him back to gastroenterology to help him.  If any Eliquis transient cessation needs to take place for banding etc., this is fine with me.     Medication Adjustments/Labs and Tests Ordered: Current medicines are reviewed at length with the patient today.  Concerns regarding medicines are outlined above.  Orders Placed This Encounter  Procedures  . Ambulatory referral to Gastroenterology   No orders of the defined types were placed in this encounter.   Patient Instructions  Medication Instructions:  The current medical regimen is effective;  continue present plan and medications.  If you need a refill on your cardiac medications before your next appointment, please call your pharmacy.   You are being referred to Gastroenterology for the evaluation and treatment of hemorrhoids.  Follow-Up: At [facility], you and your health needs are our priority.  As part of our continuing mission to provide you with exceptional heart care, we have created  designated Provider Care Teams.  These Care Teams include your primary Cardiologist (physician) and Advanced Practice Providers (APPs -  Physician Assistants and Nurse Practitioners) who all work together to provide you with the care you need, when you need it. You will need a follow up appointment in 6 months with Cecilie Kicks, NP and 1 year with Dr Marlou Porch.  Please call our office 2 months in advance to schedule this appointment.  You may see Candee Furbish, MD or one of the following Advanced Practice Providers on your designated Care Team:   Truitt Merle, NP Cecilie Kicks, NP . Kathyrn Drown, NP  Thank you for choosing [facility]!!         Signed, Candee Furbish, MD  10/09/2018 11:15 AM    [place]

## 2018-10-09 NOTE — Patient Instructions (Signed)
Medication Instructions:  The current medical regimen is effective;  continue present plan and medications.  If you need a refill on your cardiac medications before your next appointment, please call your pharmacy.   You are being referred to Gastroenterology for the evaluation and treatment of hemorrhoids.  Follow-Up: At [facility], you and your health needs are our priority.  As part of our continuing mission to provide you with exceptional heart care, we have created designated Provider Care Teams.  These Care Teams include your primary Cardiologist (physician) and Advanced Practice Providers (APPs -  Physician Assistants and Nurse Practitioners) who all work together to provide you with the care you need, when you need it. You will need a follow up appointment in 6 months with Cecilie Kicks, NP and 1 year with Dr Marlou Porch.  Please call our office 2 months in advance to schedule this appointment.  You may see Candee Furbish, MD or one of the following Advanced Practice Providers on your designated Care Team:   Truitt Merle, NP Cecilie Kicks, NP . Kathyrn Drown, NP  Thank you for choosing [facility]!!

## 2018-10-12 ENCOUNTER — Telehealth: Payer: Self-pay | Admitting: Gastroenterology

## 2018-10-12 NOTE — Telephone Encounter (Signed)
Pt was seen in Sept by Tye Savoy NP for hemorrhoids. Wife states he has continued to have issues since visit. Pt scheduled for phone visit 10/16/18@10 :30am. Wife aware of appt and knows for pt to continue Prep h.

## 2018-10-16 ENCOUNTER — Other Ambulatory Visit: Payer: Self-pay

## 2018-10-16 ENCOUNTER — Encounter: Payer: Self-pay | Admitting: Gastroenterology

## 2018-10-16 ENCOUNTER — Ambulatory Visit (INDEPENDENT_AMBULATORY_CARE_PROVIDER_SITE_OTHER): Payer: Medicare Other | Admitting: Gastroenterology

## 2018-10-16 VITALS — Ht 67.0 in | Wt 122.0 lb

## 2018-10-16 DIAGNOSIS — K648 Other hemorrhoids: Secondary | ICD-10-CM

## 2018-10-16 NOTE — Progress Notes (Signed)
This patient contacted our office requesting a physician telemedicine consultation regarding clinical questions and/or test results. Interactive audio and video telecommunications were attempted between this provider and the patient.  However, this technology failed due to the patient having technical difficulties OR they did not have access to video capabilities.  We continued and completed the visit with audio only.  Participants on the conference : myself and patient and wife  The patient consented to this consultation and was aware that a charge will be placed through their insurance.  I was in my office and the patient was at home   Encounter time:  Total time 18 minutes,all spent with patient on telephone   _____________________________________________________________________________________________               Daniel Chapman GI Progress Note  Chief Complaint: internal hemorrhoids  Subjective  History: Last seen by Tye Savoy, NP on 02/12/2018 for hemorrhoidal pain.  Grade 2 inflamed internal hemorrhoids found on exam, treated with Preparation H ointment.  Patient is on oral anticoagulation for A. Fib. Lately burning pain after BM's, moreso if has 2-3 BM's in a day. No bleeding. No abdominal pain.  Using prep H cream with every BM - works fairly well Occasional straining. Wife says he doesn't drink enough fluids.  Wife says suppository was tried but she cannot recall results.   ROS: Cardiovascular:  no chest pain Respiratory: no dyspnea Good appetite , weight stable  The patient's Past Medical, Family and Social History were reviewed and are on file in the EMR.  Objective:  Med list reviewed  Current Outpatient Medications:  .  apixaban (ELIQUIS) 2.5 MG TABS tablet, Take 1 tablet (2.5 mg total) by mouth 2 (two) times daily., Disp: 180 tablet, Rfl: 3 .  calcium carbonate (TUMS EX) 750 MG chewable tablet, Chew 1 tablet by mouth daily. Reported on 09/15/2015, Disp:  , Rfl:  .  cholecalciferol (VITAMIN D) 1000 UNITS tablet, Take 1,000 Units by mouth daily. Reported on 09/15/2015, Disp: , Rfl:  .  donepezil (ARICEPT) 10 MG tablet, Take 1 tablet (10 mg total) by mouth at bedtime., Disp: 30 tablet, Rfl: 11 .  rosuvastatin (CRESTOR) 5 MG tablet, Take 1 tablet (5 mg total) by mouth 3 (three) times a week. Take on Monday, Wednesday, and Fridays., Disp: 45 tablet, Rfl: 3    No exam - phone visit  No data for review  @ASSESSMENTPLANBEGIN @ Assessment: Encounter Diagnosis  Name Primary?  . Internal hemorrhoids Yes    Advised conservative treatment due to age and anti coagulation.  Plan: He will try fiber supplement and OTC prep H suppository instead of cream. Call as needed.  Nelida Meuse III

## 2018-11-30 NOTE — Progress Notes (Signed)
Subjective:   Daniel Chapman is a 85 y.o. male who presents for Medicare Annual/Subsequent preventive examination.  Review of Systems:  N/A Cardiac Risk Factors include: advanced age (>52mn, >>17women);male gender;smoking/ tobacco exposure;dyslipidemia     Objective:    Vitals: BP 112/80 (BP Location: Right Arm, Patient Position: Sitting, Cuff Size: Normal)   Pulse 82   Temp (!) 97.4 F (36.3 C) (Oral)   Ht 5' 7.5" (1.715 m) Comment: shoes  Wt 123 lb (55.8 kg)   SpO2 97%   BMI 18.98 kg/m   Body mass index is 18.98 kg/m.  Advanced Directives 01/05/2018 10/03/2017 02/06/2017 12/27/2016 09/27/2016 12/18/2015 09/15/2015  Does Patient Have a Medical Advance Directive? Yes Yes No Yes No Yes Yes  Type of AParamed[place]of [place] will [place] will - [place] - [place] will Living will;Healthcare Power of Attorney  Does patient want to make changes to medical advance directive? - - - No - Patient declined - No - Patient declined No - Patient declined  Copy of [place] Chart? No - copy requested No - copy requested - No - copy requested - No - copy requested No - copy requested  Would patient like information on creating a medical advance directive? - - No - Patient declined - - - -    Tobacco Social History   Tobacco Use  Smoking Status Current Every Day Smoker  . Packs/day: 0.50  . Types: Cigarettes  Smokeless Tobacco Never Used     Ready to quit: No Counseling given: No   Clinical Intake:  Pre-visit preparation completed: Yes  Pain : No/denies pain Pain Score: 0-No pain     Nutritional Status: BMI of 19-24  Normal Nutritional Risks: None Diabetes: No  How often do you need to have someone help you when you read instructions, pamphlets, or other written materials from your doctor or pharmacy?: 1 - Never What is the last grade level you  completed in school?: 12th grade + 2 yrs college  Interpreter Needed?: No  Comments: pt lives with spouse  Information entered by :: LPinson, LPN  Past Medical History:  Diagnosis Date  . Carcinoma of [facility] cord ([facility] 2008   radiation therapy  . Carotid artery occlusion    a. 09/2017 occ RICA, 1-39% L, occ prox LECA followed by [facility].  . Chronic atrial fibrillation   . [place]   . Fall due to ice or snow Feb. 22, 2015   Partial Right Hip replaced  . History of ITP   . Hx of colonic polyps   . Hyperlipemia   . Osteopenia    ?  .Marland KitchenOsteoporosis    dexa 06, declines further dexa or tx   . Smoker   . Stroke ([facility]    Past Surgical History:  Procedure Laterality Date  . BONE MARROW BIOPSY  05/2003  . CARDIOVERSION N/A 03/22/2017   Procedure: CARDIOVERSION;  Surgeon: NAcie FredricksonPWonda Cheng MD;  [place]  Service: Cardiovascular;  Laterality: N/A;  . CATARACT EXTRACTION    . Dexa     per patient ? osteopenia  . HEMORRHOID SURGERY    . JOINT REPLACEMENT Right Feb. 22, 2015   Partial Hip  . SPLENECTOMY    . TONSILLECTOMY AND ADENOIDECTOMY Bilateral age [age]per patient   Family History  Problem Relation Age of Onset  . Uterine cancer Mother   .  Heart disease Sister        some heart trouble  . Colon cancer Neg Hx   . Stomach cancer Neg Hx   . Pancreatic cancer Neg Hx   . Esophageal cancer Neg Hx    Social History   Socioeconomic History  . Marital status: Married    Spouse name: Not on file  . Number of children: 6  . Years of education: Not on file  . Highest education level: Not on file  Occupational History  . Occupation: Biochemist, clinical: SELF-EMPLOYED  Social Needs  . Financial resource strain: Not on file  . Food insecurity    Worry: Not on file    Inability: Not on file  . Transportation needs    Medical: Not on file    Non-medical: Not on file  Tobacco Use  . Smoking status: Current Every Day  Smoker    Packs/day: 0.50    Types: Cigarettes  . Smokeless tobacco: Never Used  Substance and Sexual Activity  . Alcohol use: No    Alcohol/week: 0.0 standard drinks  . Drug use: No  . Sexual activity: Not Currently    Partners: Female  Lifestyle  . Physical activity    Days per week: Not on file    Minutes per session: Not on file  . Stress: Not on file  Relationships  . Social Herbalist on phone: Not on file    Gets together: Not on file    Attends religious service: Not on file    Active member of club or organization: Not on file    Attends meetings of clubs or organizations: Not on file    Relationship status: Not on file  Other Topics Concern  . Not on file  Social History Narrative  . Not on file    Outpatient Encounter Medications as of 01/05/2018  Medication Sig  . calcium carbonate (TUMS EX) 750 MG chewable tablet Chew 1 tablet by mouth daily. Reported on 09/15/2015  . cholecalciferol (VITAMIN D) 1000 UNITS tablet Take 1,000 Units by mouth daily. Reported on 09/15/2015  . [DISCONTINUED] ELIQUIS 2.5 MG TABS tablet TAKE 1 TABLET BY MOUTH TWICE A DAY  . [DISCONTINUED] tamsulosin (FLOMAX) 0.4 MG CAPS capsule Take 1 capsule (0.4 mg total) by mouth daily.  . [DISCONTINUED] rosuvastatin (CRESTOR) 5 MG tablet Take 1 tablet (5 mg total) by mouth 3 (three) times a week. Take on Monday, Wednesday, and Fridays.   No facility-administered encounter medications on file as of 01/05/2018.     Activities of Daily Living In your present state of health, do you have any difficulty performing the following activities: 01/05/2018  Hearing? Y  Vision? N  Difficulty concentrating or making decisions? Y  Walking or climbing stairs? N  Dressing or bathing? N  Doing errands, shopping? N  Preparing Food and eating ? N  Using the Toilet? N  In the past six months, have you accidently leaked urine? Y  Do you have problems with loss of bowel control? N  Managing your Medications?  N  Managing your Finances? N  Housekeeping or managing your Housekeeping? N  Some recent data might be hidden    Patient Care Team: Tower, [name], MD as PCP - General Jerline Pain, MD as PCP - Cardiology (Cardiology) Nickel, Sharmon Leyden, NP as Nurse Practitioner (Vascular Surgery)   Assessment:   This is a routine wellness examination for [place].   Visual Acuity Screening  Right eye Left eye Both eyes  Without correction: 20/30-2 20/25-1 20/25-1  With correction:     Hearing Screening Comments: Bilateral hearing aids   Exercise Activities and Dietary recommendations Current Exercise Habits: The patient does not participate in regular exercise at present, Exercise limited by: None identified  Goals    . Patient Stated     Starting 01/05/2018, I will continue to take medications as prescribed.        Fall Risk Fall Risk  01/05/2018 12/27/2016 12/18/2015 12/17/2014 08/02/2012  Falls in the past year? No No No No Yes  Number falls in past yr: - - - - 1   IDepression Screen PHQ 2/9 Scores 01/05/2018 12/27/2016 12/18/2015 12/17/2014  PHQ - 2 Score 0 0 0 0  PHQ- 9 Score 0 - - -    Cognitive Function MMSE - Mini Mental State Exam 01/05/2018 12/27/2016 12/18/2015  Orientation to time _0 Orientation to Place _1 Registration _2 Attention/ Calculation 0 0 0  Recall _3 Language- name 2 objects 0 0 0  Language- repeat _4 Language- follow 3 step command _5 Language- read & follow direction 0 0 0  Write a sentence 0 0 0  Copy design 0 0 0  Total score _6 PLEASE NOTE: A Mini-Cog screen was completed. Maximum score is 17. A value of 0 denotes this part of Folstein MMSE was not completed or the patient failed this part of the Mini-Cog screening.   Mini-Cog Screening Orientation to Time - Max 5 pts Orientation to Place - Max 5 pts Registration - Max 3 pts Recall - Max 3 pts Language Repeat - Max 1 pts      Immunization History  Administered Date(s)  Administered  . Influenza Split 04/06/2011, 03/07/2012  . Influenza Whole 11/26/2004, 04/06/2007, 03/27/2008, 05/08/2009, 03/09/2010  . Influenza,inj,Quad PF,6+ Mos 03/14/2013, 03/19/2014, 04/21/2015, 02/24/2017, 04/23/2018  . Influenza-Unspecified 05/20/2016  . PPD Test 07/24/2013  . Pneumococcal Conjugate-13 12/17/2014  . Pneumococcal Polysaccharide-23 04/30/2003, 04/06/2007  . Td 09/28/2002  . Zoster 02/28/2014   Screening Tests Health Maintenance  Topic Date Due  . INFLUENZA VACCINE  12/29/2018  . TETANUS/TDAP  04/07/2022  . PNA vac Low Risk Adult  Completed     Plan:     I have personally reviewed, addressed, and noted the following in the patient's chart:  A. Medical and social history B. Use of alcohol, tobacco or illicit drugs  C. Current medications and supplements D. Functional ability and status E.  Nutritional status F.  Physical activity G. Advance directives H. List of other physicians I.  Hospitalizations, surgeries, and ER visits in previous 12 months J.  Vitals (unless it is a telemedicine encounter) K. Screenings to include cognitive, depression, hearing, vision (NOTE: hearing and vision screenings not completed in telemedicine encounter) L. Referrals and appointments   In addition, I have reviewed and discussed with patient certain preventive protocols, quality metrics, and best practice recommendations. A written personalized care plan for preventive services and recommendations were provided to patient.  Signed,   Lindell Noe, MHA, BS, LPN Health Coach

## 2018-11-30 NOTE — Progress Notes (Signed)
PCP notes:   Health maintenance:  No gaps identified  Abnormal screenings:   none  Patient concerns:   None  Nurse concerns:  None  Next PCP appt:   01/12/2018  I reviewed health advisor's note, was available for consultation, and agree with documentation and plan. Loura Pardon MD

## 2018-12-07 ENCOUNTER — Other Ambulatory Visit: Payer: Self-pay | Admitting: *Deleted

## 2018-12-07 MED ORDER — DONEPEZIL HCL 10 MG PO TABS: 10.0000 mg | ORAL_TABLET | Freq: Every day | ORAL | 1 refills | Status: DC

## 2019-01-10 ENCOUNTER — Telehealth: Payer: Self-pay | Admitting: Family Medicine

## 2019-01-10 DIAGNOSIS — E7800 Pure hypercholesterolemia, unspecified: Secondary | ICD-10-CM

## 2019-01-10 DIAGNOSIS — Z0000 Encounter for general adult medical examination without abnormal findings: Secondary | ICD-10-CM

## 2019-01-10 DIAGNOSIS — N401 Enlarged prostate with lower urinary tract symptoms: Secondary | ICD-10-CM

## 2019-01-10 DIAGNOSIS — R351 Nocturia: Secondary | ICD-10-CM

## 2019-01-10 NOTE — Telephone Encounter (Signed)
-----   Message from Ellamae Sia sent at 01/01/2019 11:06 AM EDT ----- Regarding: lab orders  Fridays, 8.14.20  AWV lab orders, please.

## 2019-01-11 ENCOUNTER — Other Ambulatory Visit (INDEPENDENT_AMBULATORY_CARE_PROVIDER_SITE_OTHER): Payer: Medicare Other

## 2019-01-11 ENCOUNTER — Ambulatory Visit (INDEPENDENT_AMBULATORY_CARE_PROVIDER_SITE_OTHER): Payer: Medicare Other

## 2019-01-11 ENCOUNTER — Ambulatory Visit: Payer: Medicare HMO

## 2019-01-11 VITALS — Ht 68.0 in | Wt 118.0 lb

## 2019-01-11 DIAGNOSIS — N401 Enlarged prostate with lower urinary tract symptoms: Secondary | ICD-10-CM | POA: Diagnosis not present

## 2019-01-11 DIAGNOSIS — R351 Nocturia: Secondary | ICD-10-CM

## 2019-01-11 DIAGNOSIS — Z0000 Encounter for general adult medical examination without abnormal findings: Secondary | ICD-10-CM | POA: Diagnosis not present

## 2019-01-11 DIAGNOSIS — E7800 Pure hypercholesterolemia, unspecified: Secondary | ICD-10-CM | POA: Diagnosis not present

## 2019-01-11 LAB — COMPREHENSIVE METABOLIC PANEL
ALT: 12 U/L (ref 0–53)
AST: 19 U/L (ref 0–37)
Albumin: 3.8 g/dL (ref 3.5–5.2)
Alkaline Phosphatase: 56 U/L (ref 39–117)
BUN: 19 mg/dL (ref 6–23)
CO2: 29 mEq/L (ref 19–32)
Calcium: 9.3 mg/dL (ref 8.4–10.5)
Chloride: 103 mEq/L (ref 96–112)
Creatinine, Ser: 0.84 mg/dL (ref 0.40–1.50)
GFR: 86.78 mL/min (ref >60.00)
Glucose, Bld: 99 mg/dL (ref 70–99)
Potassium: 4.5 mEq/L (ref 3.5–5.1)
Sodium: 138 mEq/L (ref 135–145)
Total Bilirubin: 0.5 mg/dL (ref 0.2–1.2)
Total Protein: 6.7 g/dL (ref 6.0–8.3)

## 2019-01-11 LAB — CBC WITH DIFFERENTIAL/PLATELET
Basophils Absolute: 0 10*3/uL (ref 0.0–0.1)
Basophils Relative: 0.6 % (ref 0.0–3.0)
Eosinophils Absolute: 0.1 10*3/uL (ref 0.0–0.7)
Eosinophils Relative: 2 % (ref 0.0–5.0)
HCT: 41.8 % (ref 39.0–52.0)
Hemoglobin: 13.6 g/dL (ref 13.0–17.0)
Lymphocytes Relative: 34.9 % (ref 12.0–46.0)
Lymphs Abs: 2.4 10*3/uL (ref 0.7–4.0)
MCHC: 32.5 g/dL (ref 30.0–36.0)
MCV: 95.8 fl (ref 78.0–100.0)
Monocytes Absolute: 0.9 10*3/uL (ref 0.1–1.0)
Monocytes Relative: 13.1 % — ABNORMAL HIGH (ref 3.0–12.0)
Neutro Abs: 3.4 10*3/uL (ref 1.4–7.7)
Neutrophils Relative %: 49.4 % (ref 43.0–77.0)
Platelets: 193 10*3/uL (ref 150.0–400.0)
RBC: 4.36 Mil/uL (ref 4.22–5.81)
RDW: 15.3 % (ref 11.5–15.5)
WBC: 6.9 10*3/uL (ref 4.0–10.5)

## 2019-01-11 LAB — LIPID PANEL
Cholesterol: 118 mg/dL (ref 0–200)
HDL: 34.4 mg/dL — ABNORMAL LOW (ref >39.00)
LDL Cholesterol: 61 mg/dL (ref 0–99)
NonHDL: 84.02
Total CHOL/HDL Ratio: 3
Triglycerides: 113 mg/dL (ref 0.0–149.0)
VLDL: 22.6 mg/dL (ref 0.0–40.0)

## 2019-01-11 LAB — TSH: TSH: 1.1 u[IU]/mL (ref 0.35–4.50)

## 2019-01-11 LAB — PSA, MEDICARE: PSA: 0.44 ng/ml (ref 0.10–4.00)

## 2019-01-11 NOTE — Progress Notes (Signed)
PCP notes:  Health Maintenance:  No gaps  Abnormal Screenings:  Refused mini-cog  Patient concerns:  None  Nurse concerns:  None  Next PCP appt.: 01/18/2019 at 10:45

## 2019-01-11 NOTE — Progress Notes (Signed)
Subjective:   Daniel Chapman is a 85 y.o. male who presents for Medicare Annual/Subsequent preventive examination.  This visit type was conducted due to national recommendations for restrictions regarding the COVID-19 Pandemic (e.g. social distancing). This format is felt to be most appropriate for this patient at this time. All issues noted in this document were discussed and addressed. No physical exam was performed (except for noted visual exam findings with Video Visits). This patient, Daniel Chapman, has given permission to perform this visit via telephone. Vital signs may be absent or patient reported.  Patient location:  At home  Nurse location:  At home    Review of Systems:  n/a Cardiac Risk Factors include: advanced age (>32mn, >>62women);dyslipidemia;male gender;smoking/ tobacco exposure     Objective:    Vitals: Ht '5\' 8"'  (1.727 m) Comment: per patient  Wt 118 lb (53.5 kg) Comment: per patient  BMI 17.94 kg/m   Body mass index is 17.94 kg/m.  Advanced Directives 01/11/2019 01/05/2018 10/03/2017 02/06/2017 12/27/2016 09/27/2016 12/18/2015  Does Patient Have a Medical Advance Directive? Yes Yes Yes No Yes No Yes  Type of AParamedicof [place] will [place] will [place] will - [place]Living will - [place]Living will  Does patient want to make changes to medical advance directive? - - - - No - Patient declined - No - Patient declined  Copy of HBrutusin Chart? No - copy requested No - copy requested No - copy requested - No - copy requested - No - copy requested  Would patient like information on creating a medical advance directive? - - - No - Patient declined - - -    Tobacco Social History   Tobacco Use  Smoking Status Current Every Day Smoker  . Packs/day: 0.50  . Types: Cigarettes  Smokeless Tobacco Never Used      Ready to quit: No Counseling given: Not Answered   Clinical Intake:  Pre-visit preparation completed: Yes  Pain : No/denies pain     Nutritional Status: BMI <19  Underweight Nutritional Risks: None Diabetes: No  How often do you need to have someone help you when you read instructions, pamphlets, or other written materials from your doctor or pharmacy?: 1 - Never What is the last grade level you completed in school?: some college  Interpreter Needed?: No  Information entered by :: NAllen LPN  Past Medical History:  Diagnosis Date  . 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 Carotid artery occlusion    a. 09/2017 occ RICA, 1-39% L, occ prox LECA followed by [facility].  . Chronic atrial fibrillation   . COPD (chronic obstructive pulmonary disease) ([place]   . Fall due to ice or snow Feb. 22, 2015   Partial Right Hip replaced  . History of ITP   . Hx of colonic polyps   . Hyperlipemia   . Osteopenia    ?  .Marland KitchenOsteoporosis    dexa 06, declines further dexa or tx   . Smoker   . Stroke ([facility]    Past Surgical History:  Procedure Laterality Date  . BONE MARROW BIOPSY  05/2003  . CARDIOVERSION N/A 03/22/2017   Procedure: CARDIOVERSION;  Surgeon: NAcie FredricksonPWonda Cheng MD;  [facility] [facility]  Service: Cardiovascular;  Laterality: N/A;  . CATARACT EXTRACTION    . Dexa     per patient ? osteopenia  .  HEMORRHOID SURGERY    . JOINT REPLACEMENT Right Feb. 22, 2015   Partial Hip  . SPLENECTOMY    . TONSILLECTOMY AND ADENOIDECTOMY Bilateral age 31 per patient   Family History  Problem Relation Age of Onset  . Uterine cancer Mother   . Heart disease Sister        some heart trouble  . Colon cancer Neg Hx   . Stomach cancer Neg Hx   . Pancreatic cancer Neg Hx   . Esophageal cancer Neg Hx    Social History   Socioeconomic History  . Marital status: Married    Spouse name: Not on file  . Number of children: 6  . Years of education: Not on file  . Highest  education level: Not on file  Occupational History  . Occupation: Biochemist, clinical: SELF-EMPLOYED  Social Needs  . Financial resource strain: Not hard at all  . Food insecurity    Worry: Never true    Inability: Never true  . Transportation needs    Medical: No    Non-medical: No  Tobacco Use  . Smoking status: Current Every Day Smoker    Packs/day: 0.50    Types: Cigarettes  . Smokeless tobacco: Never Used  Substance and Sexual Activity  . Alcohol use: No    Alcohol/week: 0.0 standard drinks  . Drug use: No  . Sexual activity: Not Currently    Partners: Female  Lifestyle  . Physical activity    Days per week: 0 days    Minutes per session: 0 min  . Stress: Not at all  Relationships  . Social Herbalist on phone: Not on file    Gets together: Not on file    Attends religious service: Not on file    Active member of club or organization: Not on file    Attends meetings of clubs or organizations: Not on file    Relationship status: Not on file  Other Topics Concern  . Not on file  Social History Narrative  . Not on file    Outpatient Encounter Medications as of 01/11/2019  Medication Sig  . apixaban (ELIQUIS) 2.5 MG TABS tablet Take 1 tablet (2.5 mg total) by mouth 2 (two) times daily.  . calcium carbonate (TUMS EX) 750 MG chewable tablet Chew 1 tablet by mouth daily. Reported on 09/15/2015  . cholecalciferol (VITAMIN D) 1000 UNITS tablet Take 1,000 Units by mouth daily. Reported on 09/15/2015  . donepezil (ARICEPT) 10 MG tablet Take 1 tablet (10 mg total) by mouth at bedtime.  . rosuvastatin (CRESTOR) 5 MG tablet Take 1 tablet (5 mg total) by mouth 3 (three) times a week. Take on Monday, Wednesday, and Fridays.   No facility-administered encounter medications on file as of 01/11/2019.     Activities of Daily Living In your present state of health, do you have any difficulty performing the following activities: 01/11/2019  Hearing? Y  Comment hearing  aides  Vision? Y  Difficulty concentrating or making decisions? Y  Walking or climbing stairs? Y  Comment very careful, wears him out  Dressing or bathing? N  Doing errands, shopping? N  Preparing Food and eating ? N  Using the Toilet? N  In the past six months, have you accidently leaked urine? Y  Comment dribbles some  Do you have problems with loss of bowel control? N  Managing your Medications? N  Comment wife makes sure he takes  Managing your  Finances? N  Housekeeping or managing your Housekeeping? N  Some recent data might be hidden    Patient Care Team: Tower, [name], MD as PCP - General Jerline Pain, MD as PCP - Cardiology (Cardiology) Nickel, Sharmon Leyden, NP as Nurse Practitioner (Vascular Surgery)   Assessment:   This is a routine wellness examination for Daniel Chapman.  Exercise Activities and Dietary recommendations Current Exercise Habits: The patient does not participate in regular exercise at present  Goals    . Patient Stated     Starting 01/05/2018, I will continue to take medications as prescribed.     . Patient Stated     01/11/2019, no goals       Fall Risk Fall Risk  01/11/2019 01/05/2018 12/27/2016 12/18/2015 12/17/2014  Falls in the past year? 1 No No No No  Comment tripped - - - -  Number falls in past yr: - - - - -  Injury with Fall? 0 - - - -  Risk for fall due to : History of fall(s) - - - -  Follow up Falls evaluation completed;Falls prevention discussed - - - -   Is the patient's home free of loose throw rugs in walkways, pet beds, electrical cords, etc?   yes      Grab bars in the bathroom? yes      Handrails on the stairs?   yes      Adequate lighting?   yes  Timed Get Up and Go Performed: n/a  Depression Screen PHQ 2/9 Scores 01/11/2019 01/05/2018 12/27/2016 12/18/2015  PHQ - 2 Score 0 0 0 0  PHQ- 9 Score 3 0 - -    Cognitive Function MMSE - Mini Mental State Exam 01/11/2019 01/05/2018 12/27/2016 12/18/2015  Not completed: Refused - - -   Orientation to time - '5 4 5  ' Orientation to Place - '5 5 5  ' Registration - '3 3 3  ' Attention/ Calculation - 0 0 0  Recall - '3 2 3  ' Language- name 2 objects - 0 0 0  Language- repeat - '1 1 1  ' Language- follow 3 step command - '3 3 3  ' Language- read & follow direction - 0 0 0  Write a sentence - 0 0 0  Copy design - 0 0 0  Total score - '20 18 20        ' Immunization History  Administered Date(s) Administered  . Influenza Split 04/06/2011, 03/07/2012  . Influenza Whole 11/26/2004, 04/06/2007, 03/27/2008, 05/08/2009, 03/09/2010  . Influenza,inj,Quad PF,6+ Mos 03/14/2013, 03/19/2014, 04/21/2015, 02/24/2017, 04/23/2018  . Influenza-Unspecified 05/20/2016  . PPD Test 07/24/2013  . Pneumococcal Conjugate-13 12/17/2014  . Pneumococcal Polysaccharide-23 04/30/2003, 04/06/2007  . Td 09/28/2002  . Zoster 02/28/2014    Qualifies for Shingles Vaccine? yes  Screening Tests Health Maintenance  Topic Date Due  . INFLUENZA VACCINE  12/29/2018  . TETANUS/TDAP  04/07/2022  . PNA vac Low Risk Adult  Completed   Cancer Screenings: Lung: Low Dose CT Chest recommended if Age 17-80 years, 30 pack-year currently smoking OR have quit w/in 15years. Patient does not qualify. Colorectal: not required  Additional Screenings:  Hepatitis C Screening:n/a      Plan:    Patient has no goals set.  I have personally reviewed and noted the following in the patient's chart:   . Medical and social history . Use of alcohol, tobacco or illicit drugs  . Current medications and supplements . Functional ability and status . Nutritional status . Physical activity .  Advanced directives . List of other physicians . Hospitalizations, surgeries, and ER visits in previous 12 months . Vitals . Screenings to include cognitive, depression, and falls . Referrals and appointments  In addition, I have reviewed and discussed with patient certain preventive protocols, quality metrics, and best practice  recommendations. A written personalized care plan for preventive services as well as general preventive health recommendations were provided to patient.     Kellie Simmering, LPN  [id number]

## 2019-01-11 NOTE — Patient Instructions (Signed)
[name] , Thank you for taking time to come for your Medicare Wellness Visit. I appreciate your ongoing commitment to your health goals. Please review the following plan we discussed and let me know if I can assist you in the future.   Screening recommendations/referrals: Colonoscopy: not required Recommended yearly ophthalmology/optometry visit for glaucoma screening and checkup Recommended yearly dental visit for hygiene and checkup  Vaccinations: Influenza vaccine: 03/2018 Pneumococcal vaccine: 11/2014 Tdap vaccine: 03/2012 Shingles vaccine: discussed    Advanced directives: Please bring a copy of your POA (Power of [place]) and/or Living Will to your next appointment.    Conditions/risks identified: underweight, smoking  Next appointment: 01/18/2019 at 10:45  Preventive Care 83 Years and Older, Male Preventive care refers to lifestyle choices and visits with your health care provider that can promote health and wellness. What does preventive care include?  A yearly physical exam. This is also called an annual well check.  Dental exams once or twice a year.  Routine eye exams. Ask your health care provider how often you should have your eyes checked.  Personal lifestyle choices, including:  Daily care of your teeth and gums.  Regular physical activity.  Eating a healthy diet.  Avoiding tobacco and drug use.  Limiting alcohol use.  Practicing safe sex.  Taking low doses of aspirin every day.  Taking vitamin and mineral supplements as recommended by your health care provider. What happens during an annual well check? The services and screenings done by your health care provider during your annual well check will depend on your age, overall health, lifestyle risk factors, and family history of disease. Counseling  Your health care provider may ask you questions about your:  Alcohol use.  Tobacco use.  Drug use.  Emotional well-being.  Home and  relationship well-being.  Sexual activity.  Eating habits.  History of falls.  Memory and ability to understand (cognition).  Work and work Statistician. Screening  You may have the following tests or measurements:  Height, weight, and BMI.  Blood pressure.  Lipid and cholesterol levels. These may be checked every 5 years, or more frequently if you are over 32 years old.  Skin check.  Lung cancer screening. You may have this screening every year starting at age 33 if you have a 30-pack-year history of smoking and currently smoke or have quit within the past 15 years.  Fecal occult blood test (FOBT) of the stool. You may have this test every year starting at age 96.  Flexible sigmoidoscopy or colonoscopy. You may have a sigmoidoscopy every 5 years or a colonoscopy every 10 years starting at age 26.  Prostate cancer screening. Recommendations will vary depending on your family history and other risks.  Hepatitis C blood test.  Hepatitis B blood test.  Sexually transmitted disease (STD) testing.  Diabetes screening. This is done by checking your blood sugar (glucose) after you have not eaten for a while (fasting). You may have this done every 1-3 years.  Abdominal aortic aneurysm (AAA) screening. You may need this if you are a current or former smoker.  Osteoporosis. You may be screened starting at age 30 if you are at high risk. Talk with your health care provider about your test results, treatment options, and if necessary, the need for more tests. Vaccines  Your health care provider may recommend certain vaccines, such as:  Influenza vaccine. This is recommended every year.  Tetanus, diphtheria, and acellular pertussis (Tdap, Td) vaccine. You may need a Td  booster every 10 years.  Zoster vaccine. You may need this after age 53.  Pneumococcal 13-valent conjugate (PCV13) vaccine. One dose is recommended after age 27.  Pneumococcal polysaccharide (PPSV23) vaccine. One  dose is recommended after age 21. Talk to your health care provider about which screenings and vaccines you need and how often you need them. This information is not intended to replace advice given to you by your health care provider. Make sure you discuss any questions you have with your health care provider. Document Released: 06/12/2015 Document Revised: 02/03/2016 Document Reviewed: 03/17/2015 Elsevier Interactive Patient Education  2017 [place] Prevention in the Home Falls can cause injuries. They can happen to people of all ages. There are many things you can do to make your home safe and to help prevent falls. What can I do on the outside of my home?  Regularly fix the edges of walkways and driveways and fix any cracks.  Remove anything that might make you trip as you walk through a door, such as a raised step or threshold.  Trim any bushes or trees on the path to your home.  Use bright outdoor lighting.  Clear any walking paths of anything that might make someone trip, such as rocks or tools.  Regularly check to see if handrails are loose or broken. Make sure that both sides of any steps have handrails.  Any raised decks and porches should have guardrails on the edges.  Have any leaves, snow, or ice cleared regularly.  Use sand or salt on walking paths during winter.  Clean up any spills in your garage right away. This includes oil or grease spills. What can I do in the bathroom?  Use night lights.  Install grab bars by the toilet and in the tub and shower. Do not use towel bars as grab bars.  Use non-skid mats or decals in the tub or shower.  If you need to sit down in the shower, use a plastic, non-slip stool.  Keep the floor dry. Clean up any water that spills on the floor as soon as it happens.  Remove soap buildup in the tub or shower regularly.  Attach bath mats securely with double-sided non-slip rug tape.  Do not have throw rugs and other  things on the floor that can make you trip. What can I do in the bedroom?  Use night lights.  Make sure that you have a light by your bed that is easy to reach.  Do not use any sheets or blankets that are too big for your bed. They should not hang down onto the floor.  Have a firm chair that has side arms. You can use this for support while you get dressed.  Do not have throw rugs and other things on the floor that can make you trip. What can I do in the kitchen?  Clean up any spills right away.  Avoid walking on wet floors.  Keep items that you use a lot in easy-to-reach places.  If you need to reach something above you, use a strong step stool that has a grab bar.  Keep electrical cords out of the way.  Do not use floor polish or wax that makes floors slippery. If you must use wax, use non-skid floor wax.  Do not have throw rugs and other things on the floor that can make you trip. What can I do with my stairs?  Do not leave any items on the stairs.  Make sure that there are handrails on both sides of the stairs and use them. Fix handrails that are broken or loose. Make sure that handrails are as long as the stairways.  Check any carpeting to make sure that it is firmly attached to the stairs. Fix any carpet that is loose or worn.  Avoid having throw rugs at the top or bottom of the stairs. If you do have throw rugs, attach them to the floor with carpet tape.  Make sure that you have a light switch at the top of the stairs and the bottom of the stairs. If you do not have them, ask someone to add them for you. What else can I do to help prevent falls?  Wear shoes that:  Do not have high heels.  Have rubber bottoms.  Are comfortable and fit you well.  Are closed at the toe. Do not wear sandals.  If you use a stepladder:  Make sure that it is fully opened. Do not climb a closed stepladder.  Make sure that both sides of the stepladder are locked into place.  Ask  someone to hold it for you, if possible.  Clearly mark and make sure that you can see:  Any grab bars or handrails.  First and last steps.  Where the edge of each step is.  Use tools that help you move around (mobility aids) if they are needed. These include:  Canes.  Walkers.  Scooters.  Crutches.  Turn on the lights when you go into a dark area. Replace any light bulbs as soon as they burn out.  Set up your furniture so you have a clear path. Avoid moving your furniture around.  If any of your floors are uneven, fix them.  If there are any pets around you, be aware of where they are.  Review your medicines with your doctor. Some medicines can make you feel dizzy. This can increase your chance of falling. Ask your doctor what other things that you can do to help prevent falls. This information is not intended to replace advice given to you by your health care provider. Make sure you discuss any questions you have with your health care provider. Document Released: 03/12/2009 Document Revised: 10/22/2015 Document Reviewed: 06/20/2014 Elsevier Interactive Patient Education  2017 [employer].

## 2019-01-18 ENCOUNTER — Encounter: Payer: Medicare Other | Admitting: Family Medicine

## 2019-01-21 ENCOUNTER — Telehealth: Payer: Self-pay | Admitting: *Deleted

## 2019-01-21 ENCOUNTER — Ambulatory Visit (INDEPENDENT_AMBULATORY_CARE_PROVIDER_SITE_OTHER): Payer: Medicare Other | Admitting: Family Medicine

## 2019-01-21 ENCOUNTER — Encounter: Payer: Self-pay | Admitting: Family Medicine

## 2019-01-21 ENCOUNTER — Other Ambulatory Visit: Payer: Self-pay

## 2019-01-21 VITALS — BP 122/80 | HR 84 | Temp 97.3°F | Ht 66.5 in | Wt 116.3 lb

## 2019-01-21 DIAGNOSIS — F17200 Nicotine dependence, unspecified, uncomplicated: Secondary | ICD-10-CM

## 2019-01-21 DIAGNOSIS — J449 Chronic obstructive pulmonary disease, unspecified: Secondary | ICD-10-CM

## 2019-01-21 DIAGNOSIS — G3184 Mild cognitive impairment, so stated: Secondary | ICD-10-CM

## 2019-01-21 DIAGNOSIS — I6521 Occlusion and stenosis of right carotid artery: Secondary | ICD-10-CM

## 2019-01-21 DIAGNOSIS — I714 Abdominal aortic aneurysm, without rupture, unspecified: Secondary | ICD-10-CM

## 2019-01-21 DIAGNOSIS — E7800 Pure hypercholesterolemia, unspecified: Secondary | ICD-10-CM

## 2019-01-21 DIAGNOSIS — I4891 Unspecified atrial fibrillation: Secondary | ICD-10-CM | POA: Diagnosis not present

## 2019-01-21 DIAGNOSIS — Z0000 Encounter for general adult medical examination without abnormal findings: Secondary | ICD-10-CM

## 2019-01-21 DIAGNOSIS — N401 Enlarged prostate with lower urinary tract symptoms: Secondary | ICD-10-CM

## 2019-01-21 DIAGNOSIS — M8580 Other specified disorders of bone density and structure, unspecified site: Secondary | ICD-10-CM

## 2019-01-21 DIAGNOSIS — C321 Malignant neoplasm of supraglottis: Secondary | ICD-10-CM

## 2019-01-21 DIAGNOSIS — R351 Nocturia: Secondary | ICD-10-CM

## 2019-01-21 NOTE — Assessment & Plan Note (Signed)
Pt's wife thinks this is progressing He refused cognitive screen at [facility] aricept Considering addn of namenda- info handout given to wife (considering and will call if he wants to start it)

## 2019-01-21 NOTE — Assessment & Plan Note (Signed)
Per pt no changes in breathing Nl pulse ox today  No medications Continues to smoke- not open to quitting unfortunately

## 2019-01-21 NOTE — Assessment & Plan Note (Signed)
Disc in detail risks of smoking and possible outcomes including copd, vascular/ heart disease, cancer , respiratory and sinus infections  Pt voices understanding Pt has copd and low bone mass He refuses to quit at this time

## 2019-01-21 NOTE — Patient Instructions (Addendum)
Encourage more calories for weight gain in the future  Add more beans and things with protein  Peanut butter crackers   Protein shakes are an option  Also milk shakes   Also increase fluids if you can  Water is best -but tea is ok if you hate tea   The staff up front will schedule dexa   Please try to get some exercise - maybe getting outdoors when the weather cools down   Here is some info on the generic for namenda- it is to slow down memory loss (cognitive decline)

## 2019-01-21 NOTE — Assessment & Plan Note (Signed)
Reviewed health habits including diet and exercise and skin cancer prevention Reviewed appropriate screening tests for age  Also reviewed health mt list, fam hx and immunization status , as well as social and family history   See HPI Labs rev  amw rev  Was planning flu shot today but pt refused to wear a mask during the injection and was quite belligerent (he was then asked to leave, explaining that he put staff and other pt at risk and he did so) Discussed his underweight status (suspect related to copd/smoking) suggested strongly he adds more protein to diet in the form of snacks /milk shakes  dexa ordered for osteopenia  Disc depression -he denies it (though wife wants him to re start zoloft he refuses)

## 2019-01-21 NOTE — Assessment & Plan Note (Signed)
Followed by vascular  On eliquis and crestor  No clinical changes

## 2019-01-21 NOTE — Assessment & Plan Note (Signed)
No longer sees oncology No clinical changes Not interested in smoking cessation

## 2019-01-21 NOTE — Assessment & Plan Note (Signed)
No clinical changes Followed by vascular surgery

## 2019-01-21 NOTE — Telephone Encounter (Signed)
Erin A. Requested that document what happened between the patient and myself. Pt had an appointment scheduled for a physical today when I called his name from the waiting room he was not wearing his mask it was in his hand. I asked the patient to please put his mask on before he comes in the back. Pt rolled his eyes jumped in my face where he was standing only 1-2 inches from my face and proceeded to put his mask on. I then got his weight, height and temp at the scales and then went in the exam room. As I was checking pt in he took off his mask again. I had just asked pt for a finger to get his pulse Ox as realized he took it off again. I asked pt again to please put his mask back on it's for his safety and mine. Pt then stuck his tongue out at me, stuck up his middle finger at me then changed it to his pointer finger where I then got his pulse ox. After checking pt in I made Dr. Glori Bickers aware of the situation that just happened.   After Dr. Glori Bickers was done seeing pt for his physical he and his wife needed a flu shot. After asking both of them the screening questions I asked who wanted to go 1st with their flu shot. Pt's wife said she would go 1st. As I was giving pt's wife her flu shot pt started coughing and I looked up at him and saw that he had again taken his mask completely off. I again asked pt to please put his mask back on. Pt became upset and yelled very loudly "I'll put the damn mask on when I'm ready". At this point given the previous interaction and also this 2nd interaction I didn't not feel comfortable or safe to stay in that room after the aggression from the pt. I did not give pt his flu shot I just exited the room. I let Junie Panning know what happened because at this point Dr. Glori Bickers was on a virtual phone call and I didn't want to disturbed her. Erin asked if another CMA would give the flu shot. I asked Emelia Salisbury, but given the interaction I had she didn't feel comfortable giving pt his flu shot either.  I let Dr. Glori Bickers know what happened and she went back in the room and spoke with pt. Pt left without getting his flu vaccine

## 2019-01-21 NOTE — Assessment & Plan Note (Signed)
No changes clinically  No help from flomax Does not desire tx Lab Results  Component Value Date   PSA 0.44 01/11/2019   PSA 0.54 01/05/2018   PSA 0.36 12/10/2014

## 2019-01-21 NOTE — Assessment & Plan Note (Signed)
Followed by cardiology  Focus on rate control  Taking eliquis

## 2019-01-21 NOTE — Progress Notes (Signed)
Subjective:    Patient ID: Daniel Chapman, male    DOB: Jan 13, 1932, 85 y.o.   MRN: [id number]  HPI Here for annual health mt exam/rev of chronic medical problems   Had amw visit 8/14 He declined cognitive exam   Forgot hearing aide today  Wife helps with history  Flu shot -wants today   Tetanus shot 11/13  Prostate health Lab Results  Component Value Date   PSA 0.44 01/11/2019   PSA 0.54 01/05/2018   PSA 0.36 12/10/2014   still frequent urination  flomax did not work for him   H/o carotid dz and a fib as well as AAA Followed by cardiology/vascular Taking eliquis and rosuvastatin   Blood pressure  BP Readings from Last 3 Encounters:  01/21/19 122/80  10/09/18 126/86  04/23/18 100/62   Pulse Readings from Last 3 Encounters:  01/21/19 84  10/09/18 62  04/23/18 78   Wt Readings from Last 3 Encounters:  01/21/19 116 lb 5 oz (52.8 kg)  01/11/19 118 lb (53.5 kg)  10/16/18 122 lb (55.3 kg)  appetite is not the best  Won't eat balanced meals  Just picks out what he likes  Does not like meat , no dairy  18.49 kg/m   Does not like protein drinks    COPD- about the same  Smoking status 1/2 ppd (wife thinks more)  He refuses to quit   Hx of supraglottic malignancy in the past  He has to "spit" all the time- this is baseline  Has problems swallowing some foods  Does not drink enough fluids   Osteopenia  Has declined dexa in the past -is open to it now  Prednisone in the past and current smoker  Balance is worse with age  Not doing any exercise  No recent falls  fx of hip in 2015     Hyperlipidemia Lab Results  Component Value Date   CHOL 118 01/11/2019   CHOL 100 01/05/2018   CHOL 108 04/10/2017   Lab Results  Component Value Date   HDL 34.40 (L) 01/11/2019   HDL 28.50 (L) 01/05/2018   HDL 32 (L) 04/10/2017   Lab Results  Component Value Date   LDLCALC 61 01/11/2019   LDLCALC 53 01/05/2018   LDLCALC 57 04/10/2017   Lab Results   Component Value Date   TRIG 113.0 01/11/2019   TRIG 94.0 01/05/2018   TRIG 94 04/10/2017   Lab Results  Component Value Date   CHOLHDL 3 01/11/2019   CHOLHDL 4 01/05/2018   CHOLHDL 3.4 04/10/2017   No results found for: LDLDIRECT Takes rosuvastatin 3 times weekly  LDL is very well controlled   Mild cog impairment  Taking aricept 10 mg daily  No side effects  Wife notices more confusion - is takes more concentration to get up to speed   Depression  Pt denies depression /feeling down Wife wonders about this   Hemorrhoid problems Given suppositories  Does not take daily   [place] today- 2 pages to scan into chart   Patient Active Problem List   Diagnosis Date Noted  . MCI (mild cognitive impairment) 04/23/2018  . Sleep concern 04/23/2018  . Urinary frequency 10/11/2017  . BPH (benign prostatic hyperplasia) 10/11/2017  . Chronic anticoagulation 06/29/2017  . Carotid occlusion, right   . H/O: stroke 02/06/2017  . Atrial fibrillation, new onset ([place]) 02/06/2017  . Encounter for Medicare annual wellness exam 12/17/2014  . Special screening for malignant neoplasms, colon 12/17/2014  .  Routine general medical examination at a health care facility 12/09/2014  . Aneurysm of abdominal vessel ([place]) 09/11/2013  . S/P total hip arthroplasty 08/02/2013  . H/O splenectomy 08/02/2013  . History of hip fracture 07/20/2013  . Occlusion and stenosis of carotid artery without mention of cerebral infarction 08/28/2012  . Carotid stenosis 08/14/2012  . Bruit of left carotid artery 08/01/2012  . Anxiety 01/27/2012  . Osteopenia 07/19/2011  . HOARSENESS, CHRONIC 02/05/2007  . 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 Hyperlipidemia 11/06/2006  . TOBACCO ABUSE 11/06/2006  . COPD (chronic obstructive pulmonary disease) ([place]) 11/06/2006  . COLONIC POLYPS, HX OF 11/06/2006  . PROSTATITIS, HX OF 11/06/2006   Past Medical History:  Diagnosis Date  . 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 Carotid artery occlusion    a. 09/2017 occ RICA, 1-39% L, occ prox LECA followed by [facility].  . Chronic atrial fibrillation   . COPD (chronic obstructive pulmonary disease) ([place])   . Fall due to ice or snow Feb. 22, 2015   Partial Right Hip replaced  . History of ITP   . Hx of colonic polyps   . Hyperlipemia   . Osteopenia    ?  Marland Kitchen Osteoporosis    dexa 06, declines further dexa or tx   . Smoker   . Stroke [facility])    Past Surgical History:  Procedure Laterality Date  . BONE MARROW BIOPSY  05/2003  . CARDIOVERSION N/A 03/22/2017   Procedure: CARDIOVERSION;  Surgeon: Acie Fredrickson Wonda Cheng, MD;  Location: [facility];  Service: Cardiovascular;  Laterality: N/A;  . CATARACT EXTRACTION    . Dexa     per patient ? osteopenia  . HEMORRHOID SURGERY    . JOINT REPLACEMENT Right Feb. 22, 2015   Partial Hip  . SPLENECTOMY    . TONSILLECTOMY AND ADENOIDECTOMY Bilateral age 45 per patient   Social History   Tobacco Use  . Smoking status: Current Every Day Smoker    Packs/day: 0.50    Types: Cigarettes  . Smokeless tobacco: Never Used  Substance Use Topics  . Alcohol use: No    Alcohol/week: 0.0 standard drinks  . Drug use: No   Family History  Problem Relation Age of Onset  . Uterine cancer Mother   . Heart disease Sister        some heart trouble  . Colon cancer Neg Hx   . Stomach cancer Neg Hx   . Pancreatic cancer Neg Hx   . Esophageal cancer Neg Hx    No Known Allergies Current Outpatient Medications on File Prior to Visit  Medication Sig Dispense Refill  . apixaban (ELIQUIS) 2.5 MG TABS tablet Take 1 tablet (2.5 mg total) by mouth 2 (two) times daily. 180 tablet 3  . calcium carbonate (TUMS EX) 750 MG chewable tablet Chew 1 tablet by mouth daily. Reported on 09/15/2015    . cholecalciferol (VITAMIN D) 1000 UNITS tablet Take 1,000 Units by mouth daily. Reported on 09/15/2015    . donepezil (ARICEPT) 10 MG tablet Take 1 tablet (10 mg  total) by mouth at bedtime. 90 tablet 1  . rosuvastatin (CRESTOR) 5 MG tablet Take 1 tablet (5 mg total) by mouth 3 (three) times a week. Take on Monday, Wednesday, and Fridays. 45 tablet 3   No current facility-administered medications on file prior to visit.      Review of Systems  Constitutional: Positive for fatigue. Negative for activity change, appetite change, fever  and unexpected weight change.       Sleeping more than he used to  Generally low appetite  HENT: Negative for congestion, rhinorrhea, sore throat and trouble swallowing.   Eyes: Negative for pain, redness, itching and visual disturbance.  Respiratory: Negative for cough, chest tightness, shortness of breath and wheezing.        Some smoker's cough and occ wheezing from smoking  Cardiovascular: Negative for chest pain, palpitations and leg swelling.  Gastrointestinal: Negative for abdominal pain, blood in stool, constipation, diarrhea and nausea.  Endocrine: Negative for cold intolerance, heat intolerance, polydipsia and polyuria.  Genitourinary: Positive for frequency. Negative for difficulty urinating, dysuria and urgency.       Baseline urinary frequency  Musculoskeletal: Negative for arthralgias, joint swelling and myalgias.  Skin: Negative for pallor and rash.  Neurological: Negative for dizziness, tremors, facial asymmetry, weakness, numbness and headaches.  Hematological: Negative for adenopathy. Does not bruise/bleed easily.  Psychiatric/Behavioral: Positive for dysphoric mood. Negative for decreased concentration. The patient is not nervous/anxious.        Short term memory continues to worsen Wife suspects depression -pt denies this         Objective:   Physical Exam Constitutional:      General: He is not in acute distress.    Appearance: He is well-developed. He is not ill-appearing or diaphoretic.     Comments: Under weight and frail appearing  HENT:     Head: Normocephalic and atraumatic.      Right Ear: Tympanic membrane, ear canal and external ear normal.     Left Ear: Tympanic membrane, ear canal and external ear normal.     Ears:     Comments: Hearing is poor Did not wear hearing aide today    Nose: Nose normal.     Mouth/Throat:     Mouth: Mucous membranes are moist.     Pharynx: Oropharynx is clear.  Eyes:     General: No scleral icterus.       Right eye: No discharge.        Left eye: No discharge.     Conjunctiva/sclera: Conjunctivae normal.     Pupils: Pupils are equal, round, and reactive to light.  Neck:     Musculoskeletal: Normal range of motion and neck supple. No neck rigidity or muscular tenderness.     Thyroid: No thyromegaly.     Vascular: No carotid bruit or JVD.  Cardiovascular:     Rate and Rhythm: Normal rate.     Pulses: Normal pulses.     Heart sounds: Normal heart sounds. No gallop.      Comments: A fib  Irregularly irreg rhythm Pulmonary:     Effort: Pulmonary effort is normal. No respiratory distress.     Breath sounds: No stridor. Wheezing present. No rhonchi or rales.     Comments: Diffusely distant bs Scant wheezes at bases Chest:     Chest wall: No tenderness.  Abdominal:     General: Bowel sounds are normal. There is no distension or abdominal bruit.     Palpations: Abdomen is soft. There is no mass.     Tenderness: There is no abdominal tenderness.     Hernia: No hernia is present.  Musculoskeletal:        General: No tenderness.     Right lower leg: No edema.     Left lower leg: No edema.  Lymphadenopathy:     Cervical: No cervical adenopathy.  Skin:  General: Skin is warm and dry.     Coloration: Skin is not pale.     Findings: No erythema or rash.     Comments: Solar lentigines diffusely/solar aging Also sks  Neurological:     Mental Status: He is alert.     Cranial Nerves: No cranial nerve deficit.     Motor: No abnormal muscle tone.     Coordination: Coordination normal.     Gait: Gait normal.     Deep Tendon  Reflexes: Reflexes are normal and symmetric. Reflexes normal.  Psychiatric:        Mood and Affect: Affect is inappropriate.        Behavior: Behavior is agitated.     Comments: Inappropriate affect (this is often his baseline but today is belligerent and had to be asked to leave before he could get a flu shot) Not confused   Cognition is slower            Assessment & Plan:   Problem List Items Addressed This Visit      Cardiovascular and Mediastinum   Carotid stenosis    Followed by vascular  On eliquis and crestor  No clinical changes      Aneurysm of abdominal vessel (H[facility])    No clinical changes Followed by vascular surgery      Atrial fibrillation, new onset ([place])    Followed by cardiology  Focus on rate control  Taking eliquis         Respiratory   MALIGNANT NEOPLASM OF SUPRAGLOTTIS    No longer sees oncology No clinical changes Not interested in smoking cessation       COPD (chronic obstructive pulmonary disease) ([place])    Per pt no changes in breathing Nl pulse ox today  No medications Continues to smoke- not open to quitting unfortunately         Musculoskeletal and Integument   Osteopenia    Pt is now open to getting dexa  H/o prednisone and long h/o smoking  Has fx hip in the past dexa ordered  Enc ca and D for bone health  Also exercise as tolerated      Relevant Orders   DG Bone Density     Genitourinary   BPH (benign prostatic hyperplasia)    No changes clinically  No help from flomax Does not desire tx Lab Results  Component Value Date   PSA 0.44 01/11/2019   PSA 0.54 01/05/2018   PSA 0.36 12/10/2014           Other   Hyperlipidemia    Disc goals for lipids and reasons to control them Rev last labs with pt Rev low sat fat diet in detail  Controlled well with crestor three times weekly       TOBACCO ABUSE    Disc in detail risks of smoking and possible outcomes including copd, vascular/ heart disease, cancer ,  respiratory and sinus infections  Pt voices understanding Pt has copd and low bone mass He refuses to quit at this time       Routine general medical examination at a health care facility - Primary    Reviewed health habits including diet and exercise and skin cancer prevention Reviewed appropriate screening tests for age  Also reviewed health mt list, fam hx and immunization status , as well as social and family history   See HPI Labs rev  amw rev  Was planning flu shot today but pt refused to wear  a mask during the injection and was quite belligerent (he was then asked to leave, explaining that he put staff and other pt at risk and he did so) Discussed his underweight status (suspect related to copd/smoking) suggested strongly he adds more protein to diet in the form of snacks /milk shakes  dexa ordered for osteopenia  Disc depression -he denies it (though wife wants him to re start zoloft he refuses)          MCI (mild cognitive impairment)    Pt's wife thinks this is progressing He refused cognitive screen at [facility] aricept Considering addn of namenda- info handout given to wife (considering and will call if he wants to start it)

## 2019-01-21 NOTE — Assessment & Plan Note (Signed)
Pt is now open to getting dexa  H/o prednisone and long h/o smoking  Has fx hip in the past dexa ordered  Enc ca and D for bone health  Also exercise as tolerated

## 2019-01-21 NOTE — Assessment & Plan Note (Signed)
Disc goals for lipids and reasons to control them Rev last labs with pt Rev low sat fat diet in detail  Controlled well with crestor three times weekly

## 2019-01-24 ENCOUNTER — Encounter: Payer: Self-pay | Admitting: Family Medicine

## 2019-01-25 ENCOUNTER — Encounter: Payer: Self-pay | Admitting: Family Medicine

## 2019-02-12 ENCOUNTER — Telehealth: Payer: Self-pay

## 2019-02-12 ENCOUNTER — Telehealth: Payer: Self-pay | Admitting: Family Medicine

## 2019-02-12 MED ORDER — MEMANTINE HCL 10 MG PO TABS: 10.0000 mg | ORAL_TABLET | Freq: Two times a day (BID) | ORAL | 11 refills | Status: DC

## 2019-02-12 MED ORDER — MEMANTINE HCL 28 X 5 MG & 21 X 10 MG PO TABS: ORAL_TABLET | ORAL | 0 refills | Status: DC

## 2019-02-12 NOTE — Telephone Encounter (Signed)
I spoke with the patient's wife  His dementia symptoms are worsening- he is confused more often and forgetting more  After discussion with family-he is agreeable to start namenda  I also offered a neuro ref in the future (he would not be open to that now) for more cognitive testing  He still refuses zoloft for depression unfortunately   I sent px for the titration pack for namenda After that can take 10 mg bid   inst to call if side effects or problems

## 2019-02-12 NOTE — Telephone Encounter (Signed)
namenda px

## 2019-02-12 NOTE — Telephone Encounter (Signed)
Daniel Chapman left v/m(DPR signed) that she request cb from Daniel Chapman.I spoke with Daniel Chapman and asked if I could send the note to Daniel Chapman with some idea of what she needed to talk about or could I help her. Daniel Chapman said she needs to talk with Daniel Chapman about the last visit and Daniel Chapman had spoken with Daniel Chapman about  Starting pt on another med and Daniel Chapman has read about this new med and now wants to talk with Daniel Chapman about the new med. Please advise.

## 2019-02-20 ENCOUNTER — Telehealth: Payer: Self-pay | Admitting: Family Medicine

## 2019-02-20 DIAGNOSIS — F0390 Unspecified dementia without behavioral disturbance: Secondary | ICD-10-CM

## 2019-02-20 NOTE — Telephone Encounter (Signed)
Best number [phone]  [name] (spouse) called stating dr tower mention something about referring pt to be evaluated for his memory loss.  She would like to have an appointment for this

## 2019-02-20 NOTE — Telephone Encounter (Signed)
Patient's wife notified as instructed by telephone and verbalized understanding. 

## 2019-02-20 NOTE — Telephone Encounter (Signed)
Referral done- let his wife know the office will call her to set up a neurology referral  Will route to [facility]

## 2019-02-22 ENCOUNTER — Other Ambulatory Visit: Payer: Medicare Other

## 2019-02-22 ENCOUNTER — Ambulatory Visit (INDEPENDENT_AMBULATORY_CARE_PROVIDER_SITE_OTHER): Payer: Medicare Other | Admitting: Neurology

## 2019-02-22 ENCOUNTER — Other Ambulatory Visit: Payer: Self-pay

## 2019-02-22 ENCOUNTER — Encounter: Payer: Self-pay | Admitting: Neurology

## 2019-02-22 VITALS — BP 113/77 | HR 72 | Ht 67.0 in | Wt 119.2 lb

## 2019-02-22 DIAGNOSIS — F0390 Unspecified dementia without behavioral disturbance: Secondary | ICD-10-CM | POA: Diagnosis not present

## 2019-02-22 DIAGNOSIS — F03A0 Unspecified dementia, mild, without behavioral disturbance, psychotic disturbance, mood disturbance, and anxiety: Secondary | ICD-10-CM

## 2019-02-22 NOTE — Progress Notes (Signed)
° °NEUROLOGY CONSULTATION NOTE ° °Daniel Chapman °MRN: [id number] °DOB: 11/28/1931 ° °Referring provider: Dr. Marne Tower °Primary care provider: Dr. Marne Tower ° °Reason for consult:  Memory loss ° °Dear Dr Tower: ° °Thank you for your kind referral of Daniel Chapman for consultation of the above symptoms. Although his history is well known to you, please allow me to reiterate it for the purpose of our medical record. The patient was accompanied to the clinic by his wife who also provides collateral information. Records and images were personally reviewed where available. ° ° °HISTORY OF PRESENT ILLNESS: °This is a pleasant 85 year old right-handed man with a history of hyperlipidemia, vocal cord carcinoma s/p radiation, right carotid artery occlusion, right cerebellar stroke, presenting for evaluation of memory loss. He is very jovial and makes several jokes during the visit, his wife reports this is his baseline, "his goal in life is to make people laugh." His wife noticed memory changes since his stroke in 2018, but over the past few weeks, she feels it had suddenly gotten worse. She states it is not so much memory as it is being able to comprehend what he is being told. She gives some examples, he took the dog out and forget about their electric fence, he said he could not get the dog in the car, she had to tell him 4 times that he had to move across the fence. She would have to tell things repeatedly and he has difficulty hearing, which irritates him. 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 He stopped doing it 6 months ago. His wife thinks he got lost one time in an unfamiliar road, he was fine with familiar roads. His wife sets out his medications and he remembers to take them. His wife manages finances. She has noticed he has been more irritable the past few months, no paranoia or  hallucinations. He reports his mood is happy. He states "I'm a little crazy." He denies any family history of dementia, his wife reminds him his sister and father had Alzheimer's disease.No history of significant head injuries, no alcohol use. He smokes 1/2 PPD, his wife thinks it is more than that.  ° °He denies any headaches, dizziness, diplopia, dysarthria, dysphagia, neck/back pain, focal numbness/tingling/weakness, bowel/bladder dysfunction. No anosmia, tremors, no falls. He has no physical residual deficits from the stroke, he felt dizzy and fell down, no focal weakness. He reports sleep is good. His wife states he sleeps too much and takes several naps during the day. He does not snore. His wife states he walks so slow, he used to be "full blast." He is taking Donepezil 10mg daily without side effects, he has a prescription for uptitration of Memantine which he has not started yet. ° °I personally reviewed MRI brain in 01/2017 which showed a late acute/early subacute infarct in the right inferior cerebellum, mild chronic microvascular disease and diffuse volume loss. Right ICA occlusion with reconstiation at the paraclinoid segment.  ° °Laboratory Data: °Lab Results  °Component Value Date  ° TSH 1.10 01/11/2019  ° °Lab Results  °Component Value Date  ° VITAMINB12 314 02/08/2008  ° ° ° °PAST MEDICAL HISTORY: °Past Medical History:  °Diagnosis Date  °• Carcinoma of vocal cord (HCC) 2008  ° radiation therapy  °• Carotid artery occlusion   ° a. 09/2017 occ RICA, 1-39% L, occ prox   prox LECA followed by [facility].   Chronic atrial fibrillation    COPD (chronic obstructive pulmonary disease) ([place])    Fall due to ice or snow Feb. 22, 2015   Partial Right Hip replaced   History of ITP    Hx of colonic polyps    Hyperlipemia    Osteopenia    ?   Osteoporosis    dexa 06, declines further dexa or tx    Smoker    Stroke [facility])     PAST SURGICAL HISTORY: Past Surgical History:  Procedure Laterality Date    BONE MARROW BIOPSY  05/2003   CARDIOVERSION N/A 03/22/2017   Procedure: CARDIOVERSION;  Surgeon: Acie Fredrickson Wonda Cheng, MD;  Location: [place];  Service: Cardiovascular;  Laterality: N/A;   CATARACT EXTRACTION     Dexa     per patient ? osteopenia   HEMORRHOID SURGERY     JOINT REPLACEMENT Right Feb. 22, 2015   Partial Hip   SPLENECTOMY     TONSILLECTOMY AND ADENOIDECTOMY Bilateral age 37 per patient    MEDICATIONS: Current Outpatient Medications on File Prior to Visit  Medication Sig Dispense Refill   apixaban (ELIQUIS) 2.5 MG TABS tablet Take 1 tablet (2.5 mg total) by mouth 2 (two) times daily. 180 tablet 3   calcium carbonate (TUMS EX) 750 MG chewable tablet Chew 1 tablet by mouth daily. Reported on 09/15/2015     cholecalciferol (VITAMIN D) 1000 UNITS tablet Take 1,000 Units by mouth daily. Reported on 09/15/2015     donepezil (ARICEPT) 10 MG tablet Take 1 tablet (10 mg total) by mouth at bedtime. 90 tablet 1   memantine (NAMENDA TITRATION PAK) tablet pack 5 mg/day for =1 week; 5 mg twice daily for =1 week; 15 mg/day given in 5 mg and 10 mg separated doses for =1 week; then 10 mg twice daily 49 tablet 0   memantine (NAMENDA) 10 MG tablet Take 1 tablet (10 mg total) by mouth 2 (two) times daily. To fill after finishing the titration pack 60 tablet 11   rosuvastatin (CRESTOR) 5 MG tablet Take 1 tablet (5 mg total) by mouth 3 (three) times a week. Take on Monday, Wednesday, and Fridays. 45 tablet 3   No current facility-administered medications on file prior to visit.     ALLERGIES: No Known Allergies  FAMILY HISTORY: Family History  Problem Relation Age of Onset   Uterine cancer Mother    Heart disease Sister        some heart trouble   Colon cancer Neg Hx    Stomach cancer Neg Hx    Pancreatic cancer Neg Hx    Esophageal cancer Neg Hx     SOCIAL HISTORY: Social History   Socioeconomic History   Marital status: Married    Spouse name: Not on file     Number of children: 6   Years of education: Not on file   Highest education level: Not on file  Occupational History   Occupation: Biochemist, clinical: SELF-EMPLOYED  Social Designer, fashion/clothing strain: Not hard at all   Food insecurity    Worry: Never true    Inability: Never true   Transportation needs    Medical: No    Non-medical: No  Tobacco Use   Smoking status: Current Every Day Smoker    Packs/day: 0.50    Types: Cigarettes   Smokeless tobacco: Never Used  Substance and Sexual Activity   Alcohol use: No  Alcohol/week: 0.0 standard drinks  °• Drug use: No  °• Sexual activity: Not Currently  °  Partners: Female  °Lifestyle  °• Physical activity  °  Days per week: 0 days  °  Minutes per session: 0 min  °• Stress: Not at all  °Relationships  °• Social connections  °  Talks on phone: Not on file  °  Gets together: Not on file  °  Attends religious service: Not on file  °  Active member of club or organization: Not on file  °  Attends meetings of clubs or organizations: Not on file  °  Relationship status: Not on file  °• Intimate partner violence  °  Fear of current or ex partner: No  °  Emotionally abused: No  °  Physically abused: No  °  Forced sexual activity: No  °Other Topics Concern  °• Not on file  °Social History Narrative  °• Not on file  ° ° °REVIEW OF SYSTEMS: °Constitutional: No fevers, chills, or sweats, no generalized fatigue, change in appetite °Eyes: No visual changes, double vision, eye pain °Ear, nose and throat: No hearing loss, ear pain, nasal congestion, sore throat °Cardiovascular: No chest pain, palpitations °Respiratory:  No shortness of breath at rest or with exertion, wheezes °GastrointestinaI: No nausea, vomiting, diarrhea, abdominal pain, fecal incontinence °Genitourinary:  No dysuria, urinary retention or frequency °Musculoskeletal:  No neck pain, back pain °Integumentary: No rash, pruritus, skin lesions °Neurological: as  above °Psychiatric: No depression, insomnia, anxiety °Endocrine: No palpitations, fatigue, diaphoresis, mood swings, change in appetite, change in weight, increased thirst °Hematologic/Lymphatic:  No anemia, purpura, petechiae. °Allergic/Immunologic: no itchy/runny eyes, nasal congestion, recent allergic reactions, rashes ° °PHYSICAL EXAM: °Vitals:  ° 02/22/19 1039  °BP: 113/77  °Pulse: 72  °SpO2: 99%  ° °General: No acute distress, very jovial, making jokes with most of his answers but answers appropriately °Skin/Extremities: No rash, no edema °Neurological Exam: °Mental status: alert and oriented to person, place, and time, no dysarthria or aphasia, Fund of knowledge is reduced.  Recent and remote memory are impaired.  Attention and concentration are reduced.    Able to name objects and repeat phrases.  °MMSE - Mini Mental State Exam 03/01/2019 01/11/2019 01/05/2018  °Not completed: - Refused -  °Orientation to time 5 - 5  °Orientation to Place 4 - 5  °Registration 0 - 3  °Attention/ Calculation 2 - 0  °Recall 2 - 3  °Language- name 2 objects 1 - 0  °Language- repeat 0 - 1  °Language- follow 3 step command 2 - 3  °Language- read & follow direction 1 - 0  °Write a sentence 1 - 0  °Copy design 0 - 0  °Total score 18 - 20  ° °Cranial nerves: °CN I: not tested °CN II: pupils equal, round and reactive to light, visual fields intact °CN III, IV, VI:  full range of motion, no nystagmus, no ptosis °CN V: facial sensation intact °CN VII: upper and lower face symmetric °CN VIII: hard of hearing °CN IX, X: gag intact, uvula midline °CN XI: sternocleidomastoid and trapezius muscles intact °CN XII: tongue midline °Bulk & Tone: normal, no fasciculations, no cogwheeling °Motor: 5/5 throughout with no pronator drift. °Sensation: intact to light touch, cold, pin, vibration and joint position sense.  No extinction to double simultaneous stimulation.  Romberg test negative °Deep Tendon Reflexes: +2 throughout, no ankle clonus °Plantar  responses: downgoing bilaterally °Cerebellar: no incoordination on finger to nose testing °Gait: narrow-based   and steady, able to tandem walk adequately. Tremor: none  IMPRESSION: This is a pleasant 85 year old right-handed man with a history of hyperlipidemia, vocal cord carcinoma s/p radiation, right carotid artery occlusion, right cerebellar stroke, presenting for evaluation of memory loss. His wife feels his memory has been worsening, but is more concerned about his increased difficulty comprehending things. His MMSE today is 18/30. Symptoms suggestive of mild to moderate dementia, likely vascular. With sudden changes noted, rule out UTI, UA will be done today. MRI brain without contrast will be ordered to assess for underlying structural abnormality. He was encouraged to proceed with starting Memantine in addition to Donepezil. Continue to monitor driving. We discussed the importance of control of vascular risk factors for secondary stroke prevention, physical exercise, and brain stimulation exercises for brain health. Follow-up in 6 months, they know to call for any changes.   Thank you for allowing me to participate in the care of this patient. Please do not hesitate to call for any questions or concerns.   Ellouise Newer, M.D.  CC: Dr. Glori Bickers

## 2019-02-22 NOTE — Patient Instructions (Addendum)
1. Schedule MRI brain without contrast. This will be performed at [facility]. They will contact you with an appointment date and time. You may call them if you like. [phone]. 2. We will do a urinalysis today. Go to the 2nd floor of the building to [address]. 3. Proceed with starting Memantine as prescribed. Continue Donepezil daily 4. Continue to monitor driving 5. Follow-up in 6 months, call for any changes  FALL PRECAUTIONS: Be cautious when walking. Scan the area for obstacles that may increase the risk of trips and falls. When getting up in the mornings, sit up at the edge of the bed for a few minutes before getting out of bed. Consider elevating the bed at the head end to avoid drop of blood pressure when getting up. Walk always in a well-lit room (use night lights in the walls). Avoid area rugs or power cords from appliances in the middle of the walkways. Use a walker or a cane if necessary and consider physical therapy for balance exercise. Get your eyesight checked regularly.  FINANCIAL OVERSIGHT: Supervision, especially oversight when making financial decisions or transactions is also recommended.  HOME SAFETY: Consider the safety of the kitchen when operating appliances like stoves, microwave oven, and blender. Consider having supervision and share cooking responsibilities until no longer able to participate in those. Accidents with firearms and other hazards in the house should be identified and addressed as well.  DRIVING: Regarding driving, in patients with progressive memory problems, driving will be impaired. We advise to have someone else do the driving if trouble finding directions or if minor accidents are reported. Independent driving assessment is available to determine safety of driving.  ABILITY TO BE LEFT ALONE: If patient is unable to contact 911 operator, consider using LifeLine, or when the need is there, arrange for someone to stay with patients. Smoking is a  fire hazard, consider supervision or cessation. Risk of wandering should be assessed by caregiver and if detected at any point, supervision and safe proof recommendations should be instituted.  MEDICATION SUPERVISION: Inability to self-administer medication needs to be constantly addressed. Implement a mechanism to ensure safe administration of the medications.  RECOMMENDATIONS FOR ALL PATIENTS WITH MEMORY PROBLEMS: 1. Continue to exercise (Recommend 30 minutes of walking everyday, or 3 hours every week) 2. Increase social interactions - continue going to [place] and enjoy social gatherings with friends and family 3. Eat healthy, avoid fried foods and eat more fruits and vegetables 4. Maintain adequate blood pressure, blood sugar, and blood cholesterol level. Reducing the risk of stroke and cardiovascular disease also helps promoting better memory. 5. Avoid stressful situations. Live a simple life and avoid aggravations. Organize your time and prepare for the next day in anticipation. 6. Sleep well, avoid any interruptions of sleep and avoid any distractions in the bedroom that may interfere with adequate sleep quality 7. Avoid sugar, avoid sweets as there is a strong link between excessive sugar intake, diabetes, and cognitive impairment The Mediterranean diet has been shown to help patients reduce the risk of progressive memory disorders and reduces cardiovascular risk. This includes eating fish, eat fruits and green leafy vegetables, nuts like almonds and hazelnuts, walnuts, and also use olive oil. Avoid fast foods and fried foods as much as possible. Avoid sweets and sugar as sugar use has been linked to worsening of memory function.  There is always a concern of gradual progression of memory problems. If this is the case, then we may need to adjust level  of care according to patient needs. Support, both to the patient and caregiver, should then be put into place.

## 2019-02-23 LAB — URINALYSIS
Bilirubin Urine: NEGATIVE
Glucose, UA: NEGATIVE
Hgb urine dipstick: NEGATIVE
Nitrite: NEGATIVE
Specific Gravity, Urine: 1.028 (ref 1.001–1.03)
pH: <=5 (ref 5.0–8.0)

## 2019-02-25 ENCOUNTER — Telehealth: Payer: Self-pay

## 2019-02-25 NOTE — Telephone Encounter (Signed)
-----   Message from Cameron Sprang, MD sent at 02/25/2019  8:35 AM EDT ----- Pls let wife know the urinalysis was negative for infection. Thanks

## 2019-02-25 NOTE — Telephone Encounter (Signed)
Left message informing that UA showed no signs of infection and to call back with any concerns.

## 2019-03-05 ENCOUNTER — Encounter (HOSPITAL_COMMUNITY): Payer: Medicare Other

## 2019-03-05 ENCOUNTER — Ambulatory Visit: Payer: Medicare Other | Admitting: Vascular Surgery

## 2019-03-12 ENCOUNTER — Other Ambulatory Visit: Payer: Medicare Other

## 2019-03-19 ENCOUNTER — Other Ambulatory Visit: Payer: Self-pay | Admitting: Family Medicine

## 2019-03-20 ENCOUNTER — Other Ambulatory Visit: Payer: Self-pay

## 2019-03-20 ENCOUNTER — Ambulatory Visit
Admission: RE | Admit: 2019-03-20 | Discharge: 2019-03-20 | Disposition: A | Discharge status: Home / Self Care | Payer: Medicare Other | Source: Ambulatory Visit | Attending: Neurology | Admitting: Neurology

## 2019-03-20 DIAGNOSIS — F03A0 Unspecified dementia, mild, without behavioral disturbance, psychotic disturbance, mood disturbance, and anxiety: Secondary | ICD-10-CM

## 2019-03-20 DIAGNOSIS — R410 Disorientation, unspecified: Secondary | ICD-10-CM | POA: Diagnosis not present

## 2019-03-20 DIAGNOSIS — F0390 Unspecified dementia without behavioral disturbance: Secondary | ICD-10-CM

## 2019-03-21 ENCOUNTER — Telehealth: Payer: Self-pay

## 2019-03-21 NOTE — Telephone Encounter (Signed)
Patient advised of MRI results.

## 2019-03-21 NOTE — Telephone Encounter (Signed)
-----   Message from Cameron Sprang, MD sent at 03/21/2019 10:50 AM EDT ----- Pls let wife know the MRI brain did not show any evidence of tumor, bleed, or new stroke. It showed age-related changes and hardening of the small blood vessels in the brain, similar to brain scan in 2018. Thanks

## 2019-04-12 ENCOUNTER — Encounter: Payer: Self-pay | Admitting: Family Medicine

## 2019-04-16 ENCOUNTER — Telehealth: Payer: Self-pay

## 2019-04-16 NOTE — Telephone Encounter (Signed)
Mrs Mulla [facility] signed) left v/m wanting to know if Dr Glori Bickers could get information for her about findings from Dr Delice Lesch. Mrs Romick said "Pt was seen by Dr Delice Lesch on 02/22/19 for mental condition and Mrs Jaggers did not get any report on what Dr Delice Lesch found". Per 03/21/19 phone note Dr Aquino's office notified pt of MRI results. Mrs Nuzum request cb after Dr Glori Bickers reviews note.

## 2019-04-16 NOTE — Telephone Encounter (Signed)
It looks like Dr Delice Lesch thinks this is mild to moderate dementia (vascular type) but wanted to rule out another cause (re: the MRI)  Recommended he take both the generic namenda and aricept and follow up with them in 6 months   I will forward this also to Dr Delice Lesch

## 2019-04-17 ENCOUNTER — Other Ambulatory Visit: Payer: Self-pay | Admitting: *Deleted

## 2019-04-17 MED ORDER — APIXABAN 2.5 MG PO TABS: 2.5000 mg | ORAL_TABLET | Freq: Two times a day (BID) | ORAL | 2 refills | Status: DC

## 2019-04-17 NOTE — Telephone Encounter (Signed)
Pt's wife notified of Dr. Marliss Coots comments and verbalized understanding. She said she was not aware pt had dementia. I did advise pt that phone note was also sent to Dr. Delice Lesch so his office may follow up with her as well to see if she had any other questions.

## 2019-04-17 NOTE — Telephone Encounter (Signed)
Eliquis 2.5mg  paper refill request received from [facility]. Pt is 85yrs old, weight-54.1kg, Crea-0.84 on 01/11/2019, Diagnosis-Afib, and last seen by Dr. Marlou Porch on 10/09/2018. Dose is appropriate based on dosing criteria. Will send in refill to requested pharmacy.

## 2019-04-29 ENCOUNTER — Telehealth: Payer: Self-pay | Admitting: *Deleted

## 2019-04-29 NOTE — Telephone Encounter (Signed)

## 2019-05-01 ENCOUNTER — Telehealth: Payer: Self-pay

## 2019-05-01 NOTE — Telephone Encounter (Signed)
Mrs Grap [facility] signed) said that pt has been out of memantine for 1 wk. Mrs Clemon said [facility] [address] has made several attempts to get refills on memantine but no response. I advised Mrs Compere I could see no refill request for memantine from [facility] and pt should have available refills that were sent electronically on 02/12/19 # 60 x 11 refills. I spoke with Jacalyn Lefevre at [facility] and they received the refill on memantine 02/12/19 # 60 and no refills. I asked Jacalyn Lefevre to correct and she did add # 10 refills and will be ready for pick up in 2 hrs. Mrs Morita notified and voiced understanding.

## 2019-05-02 ENCOUNTER — Other Ambulatory Visit: Payer: Self-pay

## 2019-05-02 ENCOUNTER — Telehealth (INDEPENDENT_AMBULATORY_CARE_PROVIDER_SITE_OTHER): Payer: Medicare Other | Admitting: Cardiology

## 2019-05-02 ENCOUNTER — Encounter: Payer: Self-pay | Admitting: Cardiology

## 2019-05-02 VITALS — BP 115/78 | HR 85 | Ht 67.0 in | Wt 119.2 lb

## 2019-05-02 DIAGNOSIS — I482 Chronic atrial fibrillation, unspecified: Secondary | ICD-10-CM

## 2019-05-02 DIAGNOSIS — R42 Dizziness and giddiness: Secondary | ICD-10-CM

## 2019-05-02 DIAGNOSIS — Z7901 Long term (current) use of anticoagulants: Secondary | ICD-10-CM

## 2019-05-02 DIAGNOSIS — E785 Hyperlipidemia, unspecified: Secondary | ICD-10-CM

## 2019-05-02 DIAGNOSIS — F0150 Vascular dementia without behavioral disturbance: Secondary | ICD-10-CM

## 2019-05-02 DIAGNOSIS — Z720 Tobacco use: Secondary | ICD-10-CM

## 2019-05-02 NOTE — Progress Notes (Signed)
Virtual Visit via Telephone Note   This visit type was conducted due to national recommendations for restrictions regarding the COVID-19 Pandemic (e.g. social distancing) in an effort to limit this patient's exposure and mitigate transmission in our community.  Due to his co-morbid illnesses, this patient is at least at moderate risk for complications without adequate follow up.  This format is felt to be most appropriate for this patient at this time.  The patient did not have access to video technology/had technical difficulties with video requiring transitioning to audio format only (telephone).  All issues noted in this document were discussed and addressed.  No physical exam could be performed with this format.  Please refer to the patient's chart for his  consent to telehealth for [facility].   Date:  05/02/2019   ID:  [name], DOB December 14, 1931, MRN [id number]  Patient Location: Home Provider Location: Office  PCP:  Abner Greenspan, MD  Cardiologist:  Candee Furbish, MD  Electrophysiologist:  None   Evaluation Performed:  Follow-Up Visit  Chief Complaint:  Atrial fib   History of Present Illness:    Daniel Chapman is a 85 y.o. male with atrial fib.   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  He went on oral anticoagulant and had DC cardioversion however it returned and he was managed with rate control strategy.  EF 65 to 70%.  Previously statin was decreased because of memory issue.  He has been having a difficult time with his hemorrhoids.  In review of prior note from Melina Copa, [place], he considers himself quite a jokester.  Last visit with Dr. Marlou Porch 10/09/18 and was stable except for his hemorrhoids.  Otherwise, he is  doing well no fevers chills nausea vomiting shortness of breath.  Continues to smoke.  His wife is very patient with him.  He has seen neurology and diagnosed with dementia.   MRI of brain and carotids reveals known Rt carotid stenosis and his Lt carotid is followed by vascular as above.     Today his wife was on phone with visit in addition to the pt.  He has some SOB "cause I smoke and have for so long"  No chest pain.  Smokes half a PPD no plans to stop.  He has some lightheadedness and trouble with balance.  occ dizziness.  Fell into the wall the other day.  He is not aware of reason.  concern for slower HR.    The patient does not have symptoms concerning for COVID-19 infection (fever, chills, cough, or new shortness of breath).    Past Medical History:  Diagnosis Date  . Carcinoma of vocal cord [facility]) 2008   radiation therapy  . Carotid artery occlusion    a. 09/2017 occ RICA, 1-39% L, occ prox LECA followed by VVS.  . Chronic atrial fibrillation ([name], 2015   Partial Right Hip replaced  . History of ITP   . Hx of colonic polyps   . Hyperlipemia   . Osteopenia    ?  Marland Kitchen Osteoporosis    dexa 06, [name]    Past Surgical History:  Procedure Laterality Date  . BONE MARROW BIOPSY  05/2003  . CARDIOVERSION N/A 03/22/2017   Procedure: CARDIOVERSION;  Surgeon: Acie Fredrickson Wonda Cheng, MD;  Location: [facility];  Service: Cardiovascular;  Laterality: N/A;  . CATARACT EXTRACTION    . Dexa     per patient ? osteopenia  . HEMORRHOID SURGERY    . JOINT REPLACEMENT Right Feb. 22, 2015   Partial Hip  . SPLENECTOMY    . TONSILLECTOMY AND ADENOIDECTOMY Bilateral age 60 per patient     Current Meds  Medication Sig  . apixaban (ELIQUIS) 2.5 MG TABS tablet Take 1 tablet (2.5 mg total) by mouth 2 (two) times daily.  . calcium carbonate (TUMS EX) 750 MG  chewable tablet Chew 1 tablet by mouth daily. Reported on 09/15/2015  . cholecalciferol (VITAMIN D) 1000 UNITS tablet Take 1,000 Units by mouth daily. Reported on 09/15/2015  . donepezil (ARICEPT) 10 MG tablet Take 1 tablet (10 mg total) by mouth at bedtime.  . memantine (NAMENDA) 10 MG tablet Take 1 tablet (10 mg total) by mouth 2 (two) times daily. To fill after finishing the titration pack  . rosuvastatin (CRESTOR) 5 MG tablet Take 1 tablet (5 mg total) by mouth 3 (three) times a week. Take on Monday, Wednesday, and Fridays.  . [DISCONTINUED] memantine (NAMENDA TITRATION PAK) tablet pack 5 mg/day for =1 week; 5 mg twice daily for =1 week; 15 mg/day given in 5 mg and 10 mg separated doses for =1 week; then 10 mg twice daily     Allergies:   Patient has no known allergies.   Social History   Tobacco Use  . Smoking status: Current Every Day Smoker    Packs/day: 0.50    Types: Cigarettes  . Smokeless tobacco: Never Used  Substance Use Topics  . Alcohol use: No    Alcohol/week: 0.0 standard drinks  . Drug use: No     Family Hx: The patient's family history includes Heart disease in his sister; Uterine cancer in his mother. There is no history of Colon cancer, Stomach cancer, Pancreatic cancer, or Esophageal cancer.  ROS:   Please see the history of present illness.    General:no colds or fevers, no weight changes Skin:no rashes or ulcers HEENT:no blurred vision, no congestion CV:see HPI PUL:see HPI GI:no diarrhea constipation or melena, no indigestion GU:no hematuria, no dysuria MS:no joint pain, no claudication Neuro:no syncope, no lightheadedness Endo:no diabetes, no thyroid disease  All other systems reviewed and are negative.   Prior CV studies:   The following studies were reviewed today:  Echo 02/06/17 Study Conclusions  - Left ventricle: The cavity size was normal. Wall thickness was   normal. Systolic function was vigorous. The estimated ejection   fraction was  in the range of 65% to 70%. - Aortic valve: Moderately calcified annulus. Mildly thickened   leaflets. - Mitral valve: There was mild regurgitation.   Labs/Other Tests and Data Reviewed:    EKG:  An ECG dated 04/06/19 was personally reviewed today and demonstrated:  a fib with rate control Ant Mi no changes on EKG  Recent Labs: 01/11/2019: ALT 12; BUN 19; Creatinine, Ser 0.84; Hemoglobin 13.6; Platelets 193.0; Potassium 4.5; Sodium 138; TSH 1.10   Recent Lipid Panel Lab Results  Component Value Date/Time   CHOL 118 01/11/2019 09:08 AM   CHOL 108 04/10/2017 08:11 AM   TRIG 113.0 01/11/2019 09:08 AM   HDL 34.40 (L) 01/11/2019 09:08  AM   HDL 32 (L) 04/10/2017 08:11 AM   CHOLHDL 3 01/11/2019 09:08 AM   LDLCALC 61 01/11/2019 09:08 AM   LDLCALC 57 04/10/2017 08:11 AM    Wt Readings from Last 3 Encounters:  05/02/19 119 lb 4 oz (54.1 kg)  02/22/19 119 lb 4 oz (54.1 kg)  01/21/19 116 lb 5 oz (52.8 kg)     Objective:    Vital Signs:  BP 115/78   Pulse 85   Ht _0  (1.702 m)   Wt 119 lb 4 oz (54.1 kg)   BMI 18.68 kg/m    VITAL SIGNS:  reviewed General NAD, making jokes Neuro, poor memory his wife helps with hx Pulmonary he can speak in complete sentences without SOB Psych:  Pleasant affect  ASSESSMENT & PLAN:    1. Permanent atrial fib rate controlled.  Now with dizziness is seeing neuro - concern for bradycardia - will have him wear 2 week zio patch, no meds to slow HR. 2. anticoagulation on Eliquis 3. Dementia per neurology 4. Tobacco use  Half PPD no plans to stop 5. HLD on crestor.   COVID-19 Education: The signs and symptoms of COVID-19 were discussed with the patient and how to seek care for testing (follow up with PCP or arrange E-visit).  The importance of social distancing was discussed today.  Time:   Today, I have spent 8 minutes with the patient with telehealth technology discussing the above problems.     Medication Adjustments/Labs and Tests Ordered:  Current medicines are reviewed at length with the patient today.  Concerns regarding medicines are outlined above.   Tests Ordered: No orders of the defined types were placed in this encounter.   Medication Changes: No orders of the defined types were placed in this encounter.   Follow Up:  In Person in 6 month(s)  Signed, Cecilie Kicks, NP  05/02/2019 3:45 PM    [place]

## 2019-05-02 NOTE — Patient Instructions (Signed)
Medication Instructions:  Your physician recommends that you continue on your current medications as directed. Please refer to the Current Medication list given to you today.  *If you need a refill on your cardiac medications before your next appointment, please call your pharmacy*  Lab Work: None ordered  If you have labs (blood work) drawn today and your tests are completely normal, you will receive your results only by: Marland Kitchen MyChart Message (if you have MyChart) OR . A paper copy in the mail If you have any lab test that is abnormal or we need to change your treatment, we will call you to review the results.  Testing/Procedures:  Your physician has recommended that you wear a heaert monitor. Heartr monitors are medical devices that record the heart's electrical activity. Doctors most often use these monitors to diagnose arrhythmias. Arrhythmias are problems with the speed or rhythm of the heartbeat. The monitor is a small, portable device. You can wear one while you do your normal daily activities. This is usually used to diagnose what is causing palpitations/syncope (passing out).  SEE INSTRUCTIONS BELOW:  ZIO XT- Long Term Monitor Instructions   Your physician has requested you wear your ZIO patch monitor 14 days.   This is a single patch monitor.  [employer] supplies one patch monitor per enrollment.  Additional stickers are not available.   Please do not apply patch if you will be having a Nuclear Stress Test, Echocardiogram, Cardiac CT, MRI, or Chest Xray during the time frame you would be wearing the monitor. The patch cannot be worn during these tests.  You cannot remove and re-apply the ZIO XT patch monitor.   Your ZIO patch monitor will be sent USPS Priority mail from [facility] directly to your home address. The monitor may also be mailed to a PO BOX if home delivery is not available.   It may take 3-5 days to receive your monitor after you have been enrolled.   Once you  have received you monitor, please review enclosed instructions.  Your monitor has already been registered assigning a specific monitor serial # to you.   Applying the monitor   Shave hair from upper left chest.   Hold abrader disc by orange tab.  Rub abrader in 40 strokes over left upper chest as indicated in your monitor instructions.   Clean area with 4 enclosed alcohol pads .  Use all pads to assure are is cleaned thoroughly.  Let dry.   Apply patch as indicated in monitor instructions.  Patch will be place under collarbone on left side of chest with arrow pointing upward.   Rub patch adhesive wings for 2 minutes.Remove white label marked "1".  Remove white label marked "2".  Rub patch adhesive wings for 2 additional minutes.   While looking in a mirror, press and release button in center of patch.  A small green light will flash 3-4 times .  This will be your only indicator the monitor has been turned on.     Do not shower for the first 24 hours.  You may shower after the first 24 hours.   Press button if you feel a symptom. You will hear a small click.  Record Date, Time and Symptom in the Patient Log Book.   When you are ready to remove patch, follow instructions on last 2 pages of Patient Log Book.  Stick patch monitor onto last page of Patient Log Book.   Place Patient Log Book in [place] and  White box.  Use locking tab on box and tape box closed securely.  The [employer] and [employer] has [employer] on it.  Please place in mailbox as soon as possible.  Your physician should have your test results approximately 7 days after the monitor has been mailed back to [facility].   Call [place] at [phone] if you have questions regarding your ZIO XT patch monitor.  Call them immediately if you see an orange light blinking on your monitor.   If your monitor falls off in less than 4 days contact our Monitor department at [phone].  If your monitor becomes  loose or falls off after 4 days call [employer] at [phone] for suggestions on securing your monitor.    Follow-Up: At [facility], you and your health needs are our priority.  As part of our continuing mission to provide you with exceptional heart care, we have created designated Provider Care Teams.  These Care Teams include your primary Cardiologist (physician) and Advanced Practice Providers (APPs -  Physician Assistants and Nurse Practitioners) who all work together to provide you with the care you need, when you need it.    Your next appointment:   6 month(s)  The format for your next appointment:   In Person  Provider:   You may see Candee Furbish, MD or one of the following Advanced Practice Providers on your designated Care Team:    Truitt Merle, NP  Cecilie Kicks, NP  Kathyrn Drown, NP  Other Instructions

## 2019-05-03 ENCOUNTER — Telehealth: Payer: Self-pay | Admitting: Radiology

## 2019-05-03 NOTE — Telephone Encounter (Signed)
Enrolled patient for a 14 day Zio monitor to be mailed to patients home.  

## 2019-05-13 ENCOUNTER — Other Ambulatory Visit (INDEPENDENT_AMBULATORY_CARE_PROVIDER_SITE_OTHER): Payer: Medicare Other

## 2019-05-13 DIAGNOSIS — I482 Chronic atrial fibrillation, unspecified: Secondary | ICD-10-CM

## 2019-05-13 DIAGNOSIS — R42 Dizziness and giddiness: Secondary | ICD-10-CM

## 2019-06-06 DIAGNOSIS — R42 Dizziness and giddiness: Secondary | ICD-10-CM | POA: Diagnosis not present

## 2019-06-06 DIAGNOSIS — I482 Chronic atrial fibrillation, unspecified: Secondary | ICD-10-CM | POA: Diagnosis not present

## 2019-07-01 ENCOUNTER — Telehealth: Payer: Self-pay | Admitting: Cardiology

## 2019-07-01 NOTE — Telephone Encounter (Signed)
Patient's wife, [name] is returning call requesting long term monitor results. Please call.

## 2019-07-03 NOTE — Telephone Encounter (Signed)
Returned wife's call, DPR on file.  She has been made aware of pt's monitor results.  See result note.

## 2019-07-11 ENCOUNTER — Other Ambulatory Visit: Payer: Self-pay | Admitting: Cardiology

## 2019-07-16 ENCOUNTER — Other Ambulatory Visit: Payer: Self-pay

## 2019-07-16 ENCOUNTER — Ambulatory Visit
Admission: RE | Admit: 2019-07-16 | Discharge: 2019-07-16 | Disposition: A | Discharge status: Home / Self Care | Payer: Medicare Other | Source: Ambulatory Visit | Attending: Family Medicine | Admitting: Family Medicine

## 2019-07-16 DIAGNOSIS — M8588 Other specified disorders of bone density and structure, other site: Secondary | ICD-10-CM | POA: Diagnosis not present

## 2019-07-16 DIAGNOSIS — M810 Age-related osteoporosis without current pathological fracture: Secondary | ICD-10-CM | POA: Diagnosis not present

## 2019-07-16 DIAGNOSIS — M8580 Other specified disorders of bone density and structure, unspecified site: Secondary | ICD-10-CM

## 2019-07-19 ENCOUNTER — Telehealth: Payer: Self-pay | Admitting: *Deleted

## 2019-07-19 NOTE — Telephone Encounter (Signed)
Left VM requesting pt to call the office back regarding DEXA scan

## 2019-07-22 NOTE — Telephone Encounter (Signed)
Address through result notes

## 2019-08-07 DIAGNOSIS — Z961 Presence of intraocular lens: Secondary | ICD-10-CM | POA: Diagnosis not present

## 2019-08-21 ENCOUNTER — Telehealth: Payer: Self-pay | Admitting: Family Medicine

## 2019-08-21 MED ORDER — MEMANTINE HCL 10 MG PO TABS: 10.0000 mg | ORAL_TABLET | Freq: Two times a day (BID) | ORAL | 1 refills | Status: DC

## 2019-08-21 NOTE — Telephone Encounter (Signed)
I haven't seen a refill request or fax paper refill, Rx sent to [facility] for pt

## 2019-08-21 NOTE — Telephone Encounter (Signed)
Patient's wife [name] called today She stated that [facility] advised them to call because they have tried to reach out to [place] twice and have not heard from anyone The patient is trying to have his medication sent through [facility]   memantine (NAMENDA) 10 MG tablet

## 2019-09-05 ENCOUNTER — Ambulatory Visit: Payer: Medicare Other | Admitting: Neurology

## 2019-09-05 ENCOUNTER — Encounter: Payer: Self-pay | Admitting: Neurology

## 2019-09-05 ENCOUNTER — Other Ambulatory Visit: Payer: Self-pay

## 2019-09-05 VITALS — BP 121/73 | HR 88 | Resp 18 | Ht 68.0 in | Wt 125.0 lb

## 2019-09-05 DIAGNOSIS — F0150 Vascular dementia without behavioral disturbance: Secondary | ICD-10-CM

## 2019-09-05 DIAGNOSIS — Z8521 Personal history of malignant neoplasm of larynx: Secondary | ICD-10-CM | POA: Insufficient documentation

## 2019-09-05 MED ORDER — DONEPEZIL HCL 10 MG PO TABS: 10.0000 mg | ORAL_TABLET | Freq: Every day | ORAL | 3 refills | Status: DC

## 2019-09-05 MED ORDER — MEMANTINE HCL 10 MG PO TABS: 10.0000 mg | ORAL_TABLET | Freq: Two times a day (BID) | ORAL | 3 refills | Status: DC

## 2019-09-05 NOTE — Progress Notes (Signed)
NEUROLOGY FOLLOW UP OFFICE NOTE  Daniel Chapman DG:[id number] 03-12-1932  HISTORY OF PRESENT ILLNESS: I had the pleasure of seeing Daniel Chapman in follow-up in the neurology clinic on 09/05/2019.  The patient was last seen 7 months ago for dementia. He is again accompanied by his wife who helps supplement the history today. MMSE 18/30 in 01/2019. Records and images were personally reviewed where available.  I personally reviewed MRI brain without contrast done 02/2019 which did not show any acute changes, there is moderate diffuse atrophy and chronic microvascular disease, remote right PICA territory infarct, chronic occlusion of right ICA. Memantine 10mg  BID was added to Donepezil 10mg  daily on his last visit. His wife states his memory is not too bad, but is comprehension is very bad at times. She reports he cannot process what you are telling him to do. He is hard of hearing and refuses to wear his hearing aids. He "used to [employer]," but now does not talk like he used to, "he just doesn't talk to people." She states he is "not as wild as he used to be." No paranoia or hallucinations. She is not sure if he is dizzy, he has to stand a few seconds, no falls.    History on Initial Assessment 02/17/2019: This is a pleasant 86 year old right-handed man with a history of hyperlipidemia, vocal cord carcinoma s/p radiation, right carotid artery occlusion, right cerebellar stroke, presenting for evaluation of memory loss. He is very jovial and makes several jokes during the visit, his wife reports this is his baseline, "his goal in life is to make people laugh." His wife noticed memory changes since his stroke in 2018, but over the past few weeks, she feels it had suddenly gotten worse. She states it is not so much memory as it is being able to comprehend what he is being told. She gives some examples, he took the dog out and forget about their electric fence, he said he could not get the dog in the car, she had to  tell him 4 times that he had to move across the fence. She would have to tell things repeatedly and he has difficulty hearing, which irritates him. 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 He stopped doing it 6 months ago. His wife thinks he got lost one time in an unfamiliar road, he was fine with familiar roads. His wife sets out his medications and he remembers to take them. His wife manages finances. She has noticed he has been more irritable the past few months, no paranoia or hallucinations. He reports his mood is happy. He states "I'm a little crazy." He denies any family history of dementia, his wife reminds him his sister and father had Alzheimer's disease.No history of significant head injuries, no alcohol use. He smokes 1/2 PPD, his wife thinks it is more than that.   He denies any headaches, dizziness, diplopia, dysarthria, dysphagia, neck/back pain, focal numbness/tingling/weakness, bowel/bladder dysfunction. No anosmia, tremors, no falls. He has no physical residual deficits from the stroke, he felt dizzy and fell down, no focal weakness. He reports sleep is good. His wife states he sleeps too much and takes several naps during the day. He does not snore. His wife states he walks so slow, he used to be "full blast." He is taking Donepezil 10mg  daily without side effects, he has a  prescription for uptitration of Memantine which he has not started yet.  I personally reviewed MRI brain in 01/2017 which showed a late acute/early subacute infarct in the right inferior cerebellum, mild chronic microvascular disease and diffuse volume loss. Right ICA occlusion with reconstiation at the paraclinoid segment.   PAST MEDICAL HISTORY: Past Medical History:  Diagnosis Date  . Carcinoma of vocal cord [facility]) 2008   radiation therapy  . Carotid artery occlusion    a. 09/2017 occ RICA, 1-39% L,  occ prox LECA followed by VVS.  . Chronic atrial fibrillation ([place])   . COPD (chronic obstructive pulmonary disease) ([facility])   . Fall due to ice or snow Feb. 22, 2015   Partial Right Hip replaced  . History of ITP   . Hx of colonic polyps   . Hyperlipemia   . Osteopenia    ?  Marland Kitchen Osteoporosis    dexa 06, declines further dexa or tx   . Smoker   . Stroke [facility])     MEDICATIONS: Current Outpatient Medications on File Prior to Visit  Medication Sig Dispense Refill  . apixaban (ELIQUIS) 2.5 MG TABS tablet Take 1 tablet (2.5 mg total) by mouth 2 (two) times daily. 180 tablet 2  . calcium carbonate (TUMS EX) 750 MG chewable tablet Chew 1 tablet by mouth daily. Reported on 09/15/2015    . cholecalciferol (VITAMIN D) 1000 UNITS tablet Take 1,000 Units by mouth daily. Reported on 09/15/2015    . rosuvastatin (CRESTOR) 5 MG tablet TAKE 1 TABLET BY MOUTH 3  TIMES A WEEK. TAKE ON  MONDAY, [place], AND  FRIDAYS. 45 tablet 2   No current facility-administered medications on file prior to visit.    ALLERGIES: No Known Allergies  FAMILY HISTORY: Family History  Problem Relation Age of Onset  . Uterine cancer Mother   . Heart disease Sister        some heart trouble  . Colon cancer Neg Hx   . Stomach cancer Neg Hx   . Pancreatic cancer Neg Hx   . Esophageal cancer Neg Hx     SOCIAL HISTORY: Social History   Socioeconomic History  . Marital status: Married    Spouse name: Not on file  . Number of children: 6  . Years of education: Not on file  . Highest education level: Not on file  Occupational History  . Occupation: Biochemist, clinical: SELF-EMPLOYED  Tobacco Use  . Smoking status: Current Every Day Smoker    Packs/day: 0.50    Types: Cigarettes  . Smokeless tobacco: Never Used  Substance and Sexual Activity  . Alcohol use: No    Alcohol/week: 0.0 standard drinks  . Drug use: No  . Sexual activity: Not Currently    Partners: Female  Other Topics Concern  . Not on  file  Social History Narrative   Lives with wife      Left handed      2 years of college      [employer]      Social Determinants of Health   Financial Resource Strain: Low Risk   . Difficulty of Paying Living Expenses: Not hard at all  Food Insecurity: No Food Insecurity  . Worried About Charity fundraiser in the Last Year: Never true  . Ran Out of Food in the Last Year: Never true  Transportation Needs: No Transportation Needs  . Lack of Transportation (Medical): No  . Lack of Transportation (Non-Medical): No  Physical  Activity: Inactive  . Days of Exercise per Week: 0 days  . Minutes of Exercise per Session: 0 min  Stress: No Stress Concern Present  . Feeling of Stress : Not at all  Social Connections:   . Frequency of Communication with Friends and Family:   . Frequency of Social Gatherings with Friends and Family:   . Attends Religious Services:   . Active Member of Clubs or Organizations:   . Attends Archivist Meetings:   Marland Kitchen Marital Status:   Intimate Partner Violence: Not At Risk  . Fear of Current or Ex-Partner: No  . Emotionally Abused: No  . Physically Abused: No  . Sexually Abused: No    REVIEW OF SYSTEMS: Constitutional: No fevers, chills, or sweats, no generalized fatigue, change in appetite Eyes: No visual changes, double vision, eye pain Ear, nose and throat: No hearing loss, ear pain, nasal congestion, sore throat Cardiovascular: No chest pain, palpitations Respiratory:  No shortness of breath at rest or with exertion, wheezes GastrointestinaI: No nausea, vomiting, diarrhea, abdominal pain, fecal incontinence Genitourinary:  No dysuria, urinary retention or frequency Musculoskeletal:  No neck pain, back pain Integumentary: No rash, pruritus, skin lesions Neurological: as above Psychiatric: No depression, insomnia, anxiety Endocrine: No palpitations, fatigue, diaphoresis, mood swings, change in appetite, change in weight, increased  thirst Hematologic/Lymphatic:  No anemia, purpura, petechiae. Allergic/Immunologic: no itchy/runny eyes, nasal congestion, recent allergic reactions, rashes  PHYSICAL EXAM: Vitals:   09/05/19 1436  BP: 121/73  Pulse: 88  Resp: 18  SpO2: 98%   General: No acute distress Head:  Normocephalic/atraumatic Skin/Extremities: No rash, no edema Neurological Exam: alert and oriented to person, place. Initially said it is November 02[birth year] 1931, then corrected to the correct date. No aphasia or dysarthria. Fund of knowledge is appropriate.  Recent and remote memory are intact. 3/3 delayed recall.  Attention and concentration are normal.   Cranial nerves: Pupils equal, round. Extraocular movements intact with no nystagmus. Visual fields full. No facial asymmetry. Motor: Bulk and tone normal, muscle strength 5/5 throughout with no pronator drift. Finger to nose testing intact.  Gait narrow-based and steady, no ataxia   IMPRESSION: This is a pleasant 86 yo RH man with a history of hyperlipidemia, vocal cord carcinoma s/p radiation, right carotid artery occlusion, right cerebellar stroke, with likely vascular dementia without behavioral disturbance. MMSE 18/30 in 01/2019. MRI brain showed moderate atrophy and chronic microvascular disease, remote right PICA territory infarct. He has more difficulties with executive functioning rather than working memory per wife. Continue Donepezil 10mg  daily and Memantine 10mg  BID. Follow-up in 6-8 months, they know to call for any changes.    Thank you for allowing me to participate in his care.  Please do not hesitate to call for any questions or concerns.   Ellouise Newer, M.D.   CC: Dr. Glori Bickers

## 2019-09-05 NOTE — Patient Instructions (Signed)
Great seeing you again! Continue all your medications. Follow-up in 6-8 months, call for any changes.  FALL PRECAUTIONS: Be cautious when walking. Scan the area for obstacles that may increase the risk of trips and falls. When getting up in the mornings, sit up at the edge of the bed for a few minutes before getting out of bed. Consider elevating the bed at the head end to avoid drop of blood pressure when getting up. Walk always in a well-lit room (use night lights in the walls). Avoid area rugs or power cords from appliances in the middle of the walkways. Use a walker or a cane if necessary and consider physical therapy for balance exercise. Get your eyesight checked regularly.   HOME SAFETY: Consider the safety of the kitchen when operating appliances like stoves, microwave oven, and blender. Consider having supervision and share cooking responsibilities until no longer able to participate in those. Accidents with firearms and other hazards in the house should be identified and addressed as well.   ABILITY TO BE LEFT ALONE: If patient is unable to contact 911 operator, consider using LifeLine, or when the need is there, arrange for someone to stay with patients. Smoking is a fire hazard, consider supervision or cessation. Risk of wandering should be assessed by caregiver and if detected at any point, supervision and safe proof recommendations should be instituted.  RECOMMENDATIONS FOR ALL PATIENTS WITH MEMORY PROBLEMS: 1. Continue to exercise (Recommend 30 minutes of walking everyday, or 3 hours every week) 2. Increase social interactions - continue going to [place] and enjoy social gatherings with friends and family 3. Eat healthy, avoid fried foods and eat more fruits and vegetables 4. Maintain adequate blood pressure, blood sugar, and blood cholesterol level. Reducing the risk of stroke and cardiovascular disease also helps promoting better memory. 5. Avoid stressful situations. Live a simple  life and avoid aggravations. Organize your time and prepare for the next day in anticipation. 6. Sleep well, avoid any interruptions of sleep and avoid any distractions in the bedroom that may interfere with adequate sleep quality 7. Avoid sugar, avoid sweets as there is a strong link between excessive sugar intake, diabetes, and cognitive impairment The Mediterranean diet has been shown to help patients reduce the risk of progressive memory disorders and reduces cardiovascular risk. This includes eating fish, eat fruits and green leafy vegetables, nuts like almonds and hazelnuts, walnuts, and also use olive oil. Avoid fast foods and fried foods as much as possible. Avoid sweets and sugar as sugar use has been linked to worsening of memory function.  There is always a concern of gradual progression of memory problems. If this is the case, then we may need to adjust level of care according to patient needs. Support, both to the patient and caregiver, should then be put into place.

## 2019-09-13 ENCOUNTER — Ambulatory Visit: Payer: Medicare Other | Admitting: Neurology

## 2019-09-18 ENCOUNTER — Telehealth: Payer: Self-pay | Admitting: Family Medicine

## 2019-09-18 NOTE — Chronic Care Management (AMB) (Signed)
  Chronic Care Management   Note  09/18/2019 Name: Daniel Chapman MRN: [id number] DOB: April 16, 1932  Daniel Chapman is a 86 y.o. year old male who is a primary care patient of Tower, [name], MD. I reached out to [name] by phone today in response to a referral sent by Daniel Chapman PCP, Tower, [name], MD.   [name] was given information about Chronic Care Management services today including:  1. CCM service includes personalized support from designated clinical staff supervised by his physician, including individualized plan of care and coordination with other care providers 2. 24/7 contact phone numbers for assistance for urgent and routine care needs. 3. Service will only be billed when office clinical staff spend 20 minutes or more in a month to coordinate care. 4. Only one practitioner may furnish and bill the service in a calendar month. 5. The patient may stop CCM services at any time (effective at the end of the month) by phone call to the office staff.   Patient agreed to services and verbal consent obtained.   Follow up plan:   Raynicia Dukes [employer] Scheduler

## 2019-09-23 ENCOUNTER — Other Ambulatory Visit: Payer: Self-pay

## 2019-09-23 ENCOUNTER — Telehealth: Payer: Self-pay

## 2019-09-23 ENCOUNTER — Ambulatory Visit (INDEPENDENT_AMBULATORY_CARE_PROVIDER_SITE_OTHER): Payer: Medicare Other | Admitting: Family Medicine

## 2019-09-23 ENCOUNTER — Encounter: Payer: Self-pay | Admitting: Family Medicine

## 2019-09-23 VITALS — BP 162/84 | HR 76 | Temp 97.2°F | Resp 24 | Ht 66.5 in | Wt 125.2 lb

## 2019-09-23 DIAGNOSIS — L03115 Cellulitis of right lower limb: Secondary | ICD-10-CM | POA: Diagnosis not present

## 2019-09-23 DIAGNOSIS — S90811A Abrasion, right foot, initial encounter: Secondary | ICD-10-CM

## 2019-09-23 MED ORDER — CEPHALEXIN 500 MG PO CAPS: 500.0000 mg | ORAL_CAPSULE | Freq: Two times a day (BID) | ORAL | 0 refills | Status: AC

## 2019-09-23 NOTE — Telephone Encounter (Signed)
See encounter from today.

## 2019-09-23 NOTE — Progress Notes (Signed)
   Subjective:     Daniel Chapman is a 86 y.o. male presenting for Foot Problem (cut right foot on 09/15/19. Red, swollen pus coming out? Was better but now getting worse.)     HPI   Wife provides history   #Right foot  - injury on 4/18 - hit the nightlight with his foot - had and abrasion injury - put triple antibiotic ointment and initially improved - started getting red everywhere  - put neosporin on the the area w/ some improvement - no fever/chills - soreness - no worse than when it   Review of Systems   Social History   Tobacco Use  Smoking Status Current Every Day Smoker  . Packs/day: 0.50  . Types: Cigarettes  Smokeless Tobacco Never Used        Objective:    BP Readings from Last 3 Encounters:  09/23/19 (!) 162/84  09/05/19 121/73  05/02/19 115/78   Wt Readings from Last 3 Encounters:  09/23/19 125 lb 4 oz (56.8 kg)  09/05/19 125 lb (56.7 kg)  05/02/19 119 lb 4 oz (54.1 kg)    BP (!) 162/84   Pulse 76   Temp (!) 97.2 F (36.2 C)   Resp (!) 24   Ht 5' 6.5" (1.689 m)   Wt 125 lb 4 oz (56.8 kg)   SpO2 97%   BMI 19.91 kg/m    Physical Exam Constitutional:      Appearance: Normal appearance. He is not ill-appearing or diaphoretic.  HENT:     Head:     Comments: Hard of hearing    Right Ear: External ear normal.     Left Ear: External ear normal.  Eyes:     General: No scleral icterus.    Extraocular Movements: Extraocular movements intact.     Conjunctiva/sclera: Conjunctivae normal.  Cardiovascular:     Rate and Rhythm: Normal rate.  Pulmonary:     Effort: Pulmonary effort is normal.  Musculoskeletal:     Cervical back: Neck supple.  Skin:    General: Skin is warm and dry.     Comments: Right dorsal surface of the foot with 2 cm abrasion injury with granulation tissue and surrounding erythema.   Neurological:     Mental Status: He is alert. Mental status is at baseline.  Psychiatric:        Mood and Affect: Mood normal.            Assessment & Plan:   Problem List Items Addressed This Visit    None    Visit Diagnoses    Cellulitis of right lower extremity    -  Primary   Relevant Medications   cephALEXin (KEFLEX) 500 MG capsule   Abrasion of skin of right foot       Relevant Medications   cephALEXin (KEFLEX) 500 MG capsule     As some improvement with neosporin advised continuing for another 24-48 if improving. If worsens or not improving, start abx.   Keep covered and return in 1-2 weeks if the wound is not healing or the redness is worsening  Return in about 1 week (around 09/30/2019), or if symptoms worsen or fail to improve.  Lesleigh Noe, MD

## 2019-09-23 NOTE — Telephone Encounter (Signed)
Pt's wife (DPR signed) said that pt cut his rt foot on a night light on 09/15/19; now area approx 3/4 " at cut looks red and an open wound with pus in wound. Pt has no covid symptoms, no travel and no known exposure to + covid. pts wife scheduled inoffice appt with Dr Einar Pheasant today at 11:20.per immunization list last TD 09/28/2002.

## 2019-09-23 NOTE — Patient Instructions (Signed)
#   Skin injury - Keep covered with bandage - Continue Neosporin for 2 more days - if it is improving, you can continue neosporin until the redness is gone - if not improving, then start the antibiotic - Keep covered until the skin heals - no need to use the neosporin once the redness is gone - return to see Dr. Glori Bickers next week if not improving

## 2019-10-02 ENCOUNTER — Other Ambulatory Visit: Payer: Self-pay

## 2019-10-02 ENCOUNTER — Encounter: Payer: Self-pay | Admitting: Family Medicine

## 2019-10-02 ENCOUNTER — Ambulatory Visit (INDEPENDENT_AMBULATORY_CARE_PROVIDER_SITE_OTHER): Payer: Medicare Other | Admitting: Family Medicine

## 2019-10-02 VITALS — BP 118/72 | HR 98 | Temp 98.6°F | Ht 66.5 in | Wt 123.9 lb

## 2019-10-02 DIAGNOSIS — I4891 Unspecified atrial fibrillation: Secondary | ICD-10-CM | POA: Diagnosis not present

## 2019-10-02 DIAGNOSIS — I714 Abdominal aortic aneurysm, without rupture, unspecified: Secondary | ICD-10-CM

## 2019-10-02 DIAGNOSIS — J449 Chronic obstructive pulmonary disease, unspecified: Secondary | ICD-10-CM | POA: Diagnosis not present

## 2019-10-02 DIAGNOSIS — F17200 Nicotine dependence, unspecified, uncomplicated: Secondary | ICD-10-CM

## 2019-10-02 DIAGNOSIS — L03116 Cellulitis of left lower limb: Secondary | ICD-10-CM

## 2019-10-02 DIAGNOSIS — S91301A Unspecified open wound, right foot, initial encounter: Secondary | ICD-10-CM

## 2019-10-02 DIAGNOSIS — F0390 Unspecified dementia without behavioral disturbance: Secondary | ICD-10-CM

## 2019-10-02 MED ORDER — CEPHALEXIN 500 MG PO CAPS: 500.0000 mg | ORAL_CAPSULE | Freq: Three times a day (TID) | ORAL | 0 refills | Status: DC

## 2019-10-02 NOTE — Progress Notes (Signed)
Subjective:    Patient ID: Daniel Chapman, male    DOB: [birth year], 86 y.o.   MRN: [id number]  This visit occurred during the SARS-CoV-2 public health emergency.  Safety protocols were in place, including screening questions prior to the visit, additional usage of staff PPE, and extensive cleaning of exam room while observing appropriate contact time as indicated for disinfecting solutions.    HPI Pt presents for re check of foot   He was seen by Dr cody on 4/26 for cellulitis of LLE H/o injuring R foot on 09/15/19 (hit a night lite with his foot causing abrasion that bled)  Using neosporin and covering it  Aches and itches  Not much drainage   Overall redness/swelling is improved  The wound itself is not getting smaller (or bigger)  Has h/o vascular dz (carotid/coronary) with smoking- high risk for PAD  Feet are not numb   Did well with keflex -tolerated   Patient Active Problem List   Diagnosis Date Noted  . Cellulitis of left lower extremity 10/02/2019  . Wound of right foot 10/02/2019  . 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 Dementia ([place]) 04/23/2018  . Sleep concern 04/23/2018  . Urinary frequency 10/11/2017  . BPH (benign prostatic hyperplasia) 10/11/2017  . Chronic anticoagulation 06/29/2017  . Carotid occlusion, right   . H/O: stroke 02/06/2017  . Atrial fibrillation, new onset ([place]) 02/06/2017  . Encounter for Medicare annual wellness exam 12/17/2014  . Special screening for malignant neoplasms, colon 12/17/2014  . Routine general medical examination at a health care facility 12/09/2014  . 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 S/P total hip arthroplasty 08/02/2013  . H/O splenectomy 08/02/2013  . History of hip fracture 07/20/2013  . Fall at home 07/20/2013  . Occlusion and stenosis of carotid artery without mention of cerebral infarction 08/28/2012  . Carotid stenosis 08/14/2012  . Bruit of left carotid artery 08/01/2012  . Anxiety 01/27/2012   . Osteopenia 07/19/2011  . Osteoporosis 07/19/2011  . HOARSENESS, CHRONIC 02/05/2007  . Chronic hoarseness 02/05/2007  . Hyperlipidemia 11/06/2006  . TOBACCO ABUSE 11/06/2006  . COPD (chronic obstructive pulmonary disease) ([place]) 11/06/2006  . COLONIC POLYPS, HX OF 11/06/2006  . PROSTATITIS, HX OF 11/06/2006  . Tobacco dependence 11/06/2006   Past Medical History:  Diagnosis Date  . 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 Carotid artery occlusion    a. 09/2017 occ RICA, 1-39% L, occ prox LECA followed by [facility].  . Chronic atrial fibrillation ([place])   . COPD (chronic obstructive pulmonary disease) ([place])   . Fall due to ice or snow Feb. 22, 2015   Partial Right Hip replaced  . History of ITP   . Hx of colonic polyps   . Hyperlipemia   . Osteopenia    ?  Marland Kitchen Osteoporosis    dexa 06, declines further dexa or tx   . Smoker   . Stroke [facility])    Past Surgical History:  Procedure Laterality Date  . BONE MARROW BIOPSY  05/2003  . CARDIOVERSION N/A 03/22/2017   Procedure: CARDIOVERSION;  Surgeon: Acie Fredrickson Wonda Cheng, MD;  Location: [place];  Service: Cardiovascular;  Laterality: N/A;  . CATARACT EXTRACTION    . Dexa     per patient ? osteopenia  . HEMORRHOID SURGERY    . JOINT REPLACEMENT Right Feb. 22, 2015   Partial Hip  . SPLENECTOMY    . TONSILLECTOMY AND ADENOIDECTOMY Bilateral age 39 per  patient   Social History   Tobacco Use  . Smoking status: Current Every Day Smoker    Packs/day: 0.50    Types: Cigarettes  . Smokeless tobacco: Never Used  Substance Use Topics  . Alcohol use: No    Alcohol/week: 0.0 standard drinks  . Drug use: No   Family History  Problem Relation Age of Onset  . Uterine cancer Mother   . Heart disease Sister        some heart trouble  . Colon cancer Neg Hx   . Stomach cancer Neg Hx   . Pancreatic cancer Neg Hx   . Esophageal cancer Neg Hx    No Known Allergies Current Outpatient Medications on File Prior to Visit    Medication Sig Dispense Refill  . apixaban (ELIQUIS) 2.5 MG TABS tablet Take 1 tablet (2.5 mg total) by mouth 2 (two) times daily. 180 tablet 2  . calcium carbonate (TUMS EX) 750 MG chewable tablet Chew 1 tablet by mouth daily. Reported on 09/15/2015    . cholecalciferol (VITAMIN D) 1000 UNITS tablet Take 1,000 Units by mouth daily. Reported on 09/15/2015    . donepezil (ARICEPT) 10 MG tablet Take 1 tablet (10 mg total) by mouth at bedtime. 90 tablet 3  . memantine (NAMENDA) 10 MG tablet Take 1 tablet (10 mg total) by mouth 2 (two) times daily. 180 tablet 3  . rosuvastatin (CRESTOR) 5 MG tablet TAKE 1 TABLET BY MOUTH 3  TIMES A WEEK. TAKE ON  MONDAY, [place], AND  FRIDAYS. 45 tablet 2   No current facility-administered medications on file prior to visit.     Review of Systems  Constitutional: Negative for activity change, appetite change, fatigue, fever and unexpected weight change.  HENT: Negative for congestion, rhinorrhea, sore throat and trouble swallowing.   Eyes: Negative for pain, redness, itching and visual disturbance.  Respiratory: Negative for cough, chest tightness, shortness of breath and wheezing.   Cardiovascular: Negative for chest pain and palpitations.  Gastrointestinal: Negative for abdominal pain, blood in stool, constipation, diarrhea and nausea.  Endocrine: Negative for cold intolerance, heat intolerance, polydipsia and polyuria.  Genitourinary: Negative for difficulty urinating, dysuria, frequency and urgency.  Musculoskeletal: Negative for arthralgias, joint swelling and myalgias.  Skin: Positive for wound. Negative for pallor and rash.  Neurological: Negative for dizziness, tremors, weakness, numbness and headaches.  Hematological: Negative for adenopathy. Does not bruise/bleed easily.  Psychiatric/Behavioral: Negative for decreased concentration and dysphoric mood. The patient is not nervous/anxious.        Poor memory       Objective:   Physical  Exam Constitutional:      Appearance: Normal appearance. He is normal weight.  HENT:     Head: Normocephalic and atraumatic.     Ears:     Comments: Hard of hearing     Mouth/Throat:     Comments: Baseline hoarse Eyes:     General: No scleral icterus.    Conjunctiva/sclera: Conjunctivae normal.     Pupils: Pupils are equal, round, and reactive to light.  Cardiovascular:     Rate and Rhythm: Normal rate and regular rhythm.     Heart sounds: Normal heart sounds.     Comments: Pulses L foot- plus one  R foot- less but detectable Pulmonary:     Effort: Pulmonary effort is normal.     Breath sounds: Normal breath sounds. No wheezing.     Comments: Diffusely distant bs  Musculoskeletal:  General: Tenderness and signs of injury present. No deformity.     Cervical back: Normal range of motion and neck supple.  Lymphadenopathy:     Cervical: No cervical adenopathy.  Skin:    General: Skin is warm and dry.     Findings: Erythema present.     Comments: Dorsal R foot- rectangle shaped superficial wound about 1 by 1.5 cm  Healing/yellow granulation tissue Dry/not draining  No fluctuance Mild tenderness if any  1 cm collar of redness  No bony tenderness     Neurological:     Mental Status: He is alert.     Sensory: No sensory deficit.  Psychiatric:        Mood and Affect: Mood normal.     Comments: Pt is calm today            Assessment & Plan:   Problem List Items Addressed This Visit      Cardiovascular and Mediastinum   Aneurysm of abdominal vessel ([place])    Followed by vascular  No clinical changes      Atrial fibrillation, new onset ([place])    Continues eliquis Rate controlled        Respiratory   COPD (chronic obstructive pulmonary disease) ([facility])    No acute changes No plans to quit smoking         Nervous and Auditory   Dementia ([place])    Continues neuro f/u Taking namenda and aricept        Other   Tobacco dependence    Continues to  smoke  Not ready to quit  Understands risks      Cellulitis of left lower extremity    From wound on top of foot  Overall redness/swelling is improved (wound is slow to heal however)  Ext keflex tid for 5 more days Disc dressings bid  Ref to wound cliniic      Wound of right foot - Primary    In pt at high risk of PAD (no former diagnosis) who smokes  Redness/infection symptoms have improved- will continue keflex for 5 more days Wound itself is not getting smaller  In light of risk factors for poor healing-will refer pt to wound care clinic inst to call if worse pain/redness/swelling or other change       Relevant Orders   Ambulatory referral to [facility]

## 2019-10-02 NOTE — Assessment & Plan Note (Signed)
Continues eliquis Rate controlled

## 2019-10-02 NOTE — Assessment & Plan Note (Signed)
Continues neuro f/u Taking namenda and aricept

## 2019-10-02 NOTE — Assessment & Plan Note (Signed)
Followed by vascular  No clinical changes

## 2019-10-02 NOTE — Patient Instructions (Signed)
Keep wound clean (soap and water)  No need to soak  Dry well- dress with non stick dressing / antibiotic ointment   I want you to take keflex for 5 more days   The office will call to set you up with wound clinic   If pain worsens  If redness worsens  Let me know right away

## 2019-10-02 NOTE — Assessment & Plan Note (Signed)
Continues to smoke  Not ready to quit  Understands risks

## 2019-10-02 NOTE — Assessment & Plan Note (Signed)
From wound on top of foot  Overall redness/swelling is improved (wound is slow to heal however)  Ext keflex tid for 5 more days Disc dressings bid  Ref to wound cliniic

## 2019-10-02 NOTE — Assessment & Plan Note (Signed)
In pt at high risk of PAD (no former diagnosis) who smokes  Redness/infection symptoms have improved- will continue keflex for 5 more days Wound itself is not getting smaller  In light of risk factors for poor healing-will refer pt to wound care clinic inst to call if worse pain/redness/swelling or other change

## 2019-10-02 NOTE — Assessment & Plan Note (Signed)
No acute changes No plans to quit smoking

## 2019-10-03 ENCOUNTER — Encounter (HOSPITAL_BASED_OUTPATIENT_CLINIC_OR_DEPARTMENT_OTHER): Payer: Medicare Other | Attending: Internal Medicine | Admitting: Internal Medicine

## 2019-10-03 DIAGNOSIS — E785 Hyperlipidemia, unspecified: Secondary | ICD-10-CM | POA: Diagnosis not present

## 2019-10-03 DIAGNOSIS — Z85818 Personal history of malignant neoplasm of other sites of lip, oral cavity, and pharynx: Secondary | ICD-10-CM | POA: Insufficient documentation

## 2019-10-03 DIAGNOSIS — F0150 Vascular dementia without behavioral disturbance: Secondary | ICD-10-CM | POA: Insufficient documentation

## 2019-10-03 DIAGNOSIS — I70235 Atherosclerosis of native arteries of right leg with ulceration of other part of foot: Secondary | ICD-10-CM | POA: Insufficient documentation

## 2019-10-03 DIAGNOSIS — I2510 Atherosclerotic heart disease of native coronary artery without angina pectoris: Secondary | ICD-10-CM | POA: Diagnosis not present

## 2019-10-03 DIAGNOSIS — W228XXD Striking against or struck by other objects, subsequent encounter: Secondary | ICD-10-CM | POA: Insufficient documentation

## 2019-10-03 DIAGNOSIS — S91311D Laceration without foreign body, right foot, subsequent encounter: Secondary | ICD-10-CM | POA: Insufficient documentation

## 2019-10-03 DIAGNOSIS — Z7901 Long term (current) use of anticoagulants: Secondary | ICD-10-CM | POA: Insufficient documentation

## 2019-10-03 DIAGNOSIS — I482 Chronic atrial fibrillation, unspecified: Secondary | ICD-10-CM | POA: Diagnosis not present

## 2019-10-03 DIAGNOSIS — J449 Chronic obstructive pulmonary disease, unspecified: Secondary | ICD-10-CM | POA: Diagnosis not present

## 2019-10-03 DIAGNOSIS — L97512 Non-pressure chronic ulcer of other part of right foot with fat layer exposed: Secondary | ICD-10-CM | POA: Diagnosis not present

## 2019-10-03 DIAGNOSIS — L03115 Cellulitis of right lower limb: Secondary | ICD-10-CM | POA: Insufficient documentation

## 2019-10-03 DIAGNOSIS — L97519 Non-pressure chronic ulcer of other part of right foot with unspecified severity: Secondary | ICD-10-CM | POA: Diagnosis not present

## 2019-10-03 NOTE — Progress Notes (Signed)
CANIO, CONTRERAZ ([id number]) Visit Report for 10/03/2019 Abuse/Suicide Risk Screen Details Patient Name: Date of Service: Daniel Chapman 10/03/2019 1:15 PM Medical Record Number: [id number] Patient Account Number: [id number] Date of Birth/Sex: Treating RN: 03/14/1932 (86 y.o. Daniel Chapman) Carlene Coria Primary Care Athanasios Heldman: [name] Other Clinician: Referring Amyre Segundo: Treating Cyndee Giammarco/Extender: Franki Cabot in Treatment: 0 Abuse/Suicide Risk Screen Items Answer ABUSE RISK SCREEN: Has anyone close to you tried to hurt or harm you recentlyo No Do you feel uncomfortable with anyone in your familyo No Has anyone forced you do things that you didnt want to doo No Electronic Signature(s) Signed: 10/03/2019 5:04:33 PM By: Carlene Coria RN Entered By: Carlene Coria on 10/03/2019 13:54:29 -------------------------------------------------------------------------------- Activities of Daily Living Details Patient Name: Date of Service: Daniel Chapman 10/03/2019 1:15 PM Medical Record Number: [id number] Patient Account Number: [id number] Date of Birth/Sex: Treating RN: Feb 21, 1932 (86 y.o. Daniel Chapman) Carlene Coria Primary Care Dazhane Villagomez: [name] Other Clinician: Referring Diara Chaudhari: Treating Lynkin Saini/Extender: Franki Cabot in Treatment: 0 Activities of Daily Living Items Answer Activities of Daily Living (Please select one for each item) Drive Automobile Not Able T Medications ake Need Assistance Use T elephone Need Assistance Care for Appearance Need Assistance Use T oilet Need Assistance Bath / Shower Need Assistance Dress Self Need Assistance Feed Self Need Assistance Walk Need Assistance Get In / Out Bed Need Assistance Housework Not Able Prepare Meals Not Able Handle Money Not Able Shop for Self Not Able Electronic Signature(s) Signed: 10/03/2019 5:04:33 PM By: Carlene Coria RN Entered By: Carlene Coria on 10/03/2019  13:55:18 -------------------------------------------------------------------------------- Education Screening Details Patient Name: Date of Service: [name], [name] 10/03/2019 1:15 PM Medical Record Number: [id number] Patient Account Number: [id number] Date of Birth/Sex: Treating RN: [birth year] (86 y.o. Daniel Chapman) Carlene Coria Primary Care Amarie Tarte: [name] Other Clinician: Referring Nobie Alleyne: Treating Idara Woodside/Extender: Franki Cabot in Treatment: 0 Learning Preferences/Education Level/Primary Language Learning Preference: Explanation Highest Education Level: College or Above Preferred Language: English Cognitive Barrier Language Barrier: No Translator Needed: No Memory Deficit: No Emotional Barrier: No Cultural/Religious Beliefs Affecting Medical Care: No Physical Barrier Impaired Vision: No Impaired Hearing: No Decreased Hand dexterity: No Knowledge/Comprehension Knowledge Level: Medium Comprehension Level: High Ability to understand written instructions: High Ability to understand verbal instructions: High Motivation Anxiety Level: Calm Cooperation: Cooperative Education Importance: Acknowledges Need Interest in Health Problems: Asks Questions Perception: Coherent Willingness to Engage in Self-Management High Activities: Readiness to Engage in Self-Management High Activities: Electronic Signature(s) Signed: 10/03/2019 5:04:33 PM By: Carlene Coria RN Entered By: Carlene Coria on 10/03/2019 13:55:49 -------------------------------------------------------------------------------- Fall Risk Assessment Details Patient Name: Date of Service: [name], [name] 10/03/2019 1:15 PM Medical Record Number: [id number] Patient Account Number: [id number] Date of Birth/Sex: Treating RN: [birth year] (86 y.o. Daniel Chapman) Carlene Coria Primary Care Joban Colledge: [name] Other Clinician: Referring Chandelle Harkey: Treating Breylin Dom/Extender: Franki Cabot in Treatment: 0 Fall Risk Assessment Items Have you had 2 or more falls in the last 12 monthso 0 No Have you had any fall that resulted in injury in the last 12 monthso 0 No FALLS RISK SCREEN History of falling - immediate or within 3 months 0 No Secondary diagnosis (Do you have 2 or more medical diagnoseso) 0 No Ambulatory aid None/bed rest/wheelchair/nurse 0 No Crutches/cane/walker 0 No Furniture 0 No Intravenous therapy Access/Saline/Heparin Lock 0 No Gait/Transferring Normal/ bed rest/ wheelchair 0 No Weak (  short steps with or without shuffle, stooped but able to lift head while walking, may seek 0 No support from furniture) Impaired (short steps with shuffle, may have difficulty arising from chair, head down, impaired 0 No balance) Mental Status Oriented to own ability 0 No Electronic Signature(s) Signed: 10/03/2019 5:04:33 PM By: Carlene Coria RN Entered By: Carlene Coria on 10/03/2019 13:56:17 -------------------------------------------------------------------------------- Foot Assessment Details Patient Name: Date of Service: [name], [name] 10/03/2019 1:15 PM Medical Record Number: [id number] Patient Account Number: [id number] Date of Birth/Sex: Treating RN: 04-Dec-1931 (86 y.o. Daniel Chapman) Carlene Coria Primary Care Jagdeep Ancheta: [name] Other Clinician: Referring Devany Aja: Treating Martrell Eguia/Extender: Benson Setting Weeks in Treatment: 0 Foot Assessment Items Site Locations + = Sensation present, - = Sensation absent, C = Callus, U = Ulcer R = Redness, W = Warmth, M = Maceration, PU = Pre-ulcerative lesion F = Fissure, S = Swelling, D = Dryness Assessment Right: Left: Other Deformity: No No Prior Foot Ulcer: No No Prior Amputation: No No Charcot Joint: No No Ambulatory Status: Ambulatory Without Help Gait: Steady Electronic Signature(s) Signed: 10/03/2019 5:04:33 PM By: Carlene Coria RN Entered By: Carlene Coria on 10/03/2019  14:00:42 -------------------------------------------------------------------------------- Nutrition Risk Screening Details Patient Name: Date of Service: [name], [name] 10/03/2019 1:15 PM Medical Record Number: [id number] Patient Account Number: [id number] Date of Birth/Sex: Treating RN: Jul 09, 1931 (86 y.o. Daniel Chapman) Carlene Coria Primary Care Naia Ruff: [name] Other Clinician: Referring Kelci Petrella: Treating Fowler Antos/Extender: Lisbeth Renshaw A Weeks in Treatment: 0 Height (in): 68 Weight (lbs): 123 Body Mass Index (BMI): 18.7 Nutrition Risk Screening Items Score Screening NUTRITION RISK SCREEN: I have an illness or condition that made me change the kind and/or amount of food I eat 0 No I eat fewer than two meals per day 0 No I eat few fruits and vegetables, or milk products 0 No I have three or more drinks of beer, liquor or wine almost every day 0 No I have tooth or mouth problems that make it hard for me to eat 0 No I don't always have enough money to buy the food I need 0 No I eat alone most of the time 0 No I take three or more different prescribed or over-the-counter drugs a day 1 Yes Without wanting to, I have lost or gained 10 pounds in the last six months 0 No I am not always physically able to shop, cook and/or feed myself 2 Yes Nutrition Protocols Good Risk Protocol Moderate Risk Protocol 0 Provide education on nutrition High Risk Proctocol Risk Level: Moderate Risk Score: 3 Electronic Signature(s) Signed: 10/03/2019 5:04:33 PM By: Carlene Coria RN Entered By: Carlene Coria on 10/03/2019 13:56:38

## 2019-10-03 NOTE — Progress Notes (Signed)
Daniel Chapman, Daniel Chapman ([id number]) Visit Report for 10/03/2019 Chief Complaint Document Details Patient Name: Date of Service: Daniel Chapman, [name] 10/03/2019 1:15 PM Medical Record Number: [id number] Patient Account Number: [id number] Date of Birth/Sex: Treating RN: [birth year] (86 y.o. Daniel Chapman Primary Care Provider: [name] Other Clinician: Referring Provider: Treating Provider/Extender: Franki Cabot in Treatment: 0 Information Obtained from: Patient Chief Complaint 10/03/2019; patient is here for a traumatic injury on the right dorsal foot Electronic Signature(s) Signed: 10/03/2019 5:30:42 PM By: Linton Ham MD Entered By: Linton Ham on 10/03/2019 14:56:51 -------------------------------------------------------------------------------- HPI Details Patient Name: Date of Service: Daniel Chapman, Daniel BERT D. 10/03/2019 1:15 PM Medical Record Number: [id number] Patient Account Number: [id number] Date of Birth/Sex: Treating RN: 08/06/1931 (86 y.o. Daniel Chapman Primary Care Provider: [name] Other Clinician: Referring Provider: Treating Provider/Extender: Franki Cabot in Treatment: 0 History of Present Illness HPI Description: ADMISSION 10/03/2019 This is a 86 year old man who traumatized his right foot when he got up in the middle of the night on a night light. This was in the middle of April. Came to attention of his primary physician on 4/26. The area was red and open. He was given a course of cephalexin with some improvement. He was seen yesterday in follow-up again by primary care he was given another round of Keflex although noted that he was considerably better. They have been using Neosporin and a Band-Aid on the wound area. The patient has multi-infarct dementia and is really not able to respond to questions all that much. He does not appear to be in a lot of discomfort. Past medical history chronic A. fib on Eliquis,  vascular dementia, hyperlipidemia, continued tobacco abuse, COPD, coronary artery disease and a history of oropharyngeal cancer for which she received radiation in 2012 ABI in our clinic was 0.41 Electronic Signature(s) Signed: 10/03/2019 5:30:42 PM By: Linton Ham MD Entered By: Linton Ham on 10/03/2019 14:59:26 -------------------------------------------------------------------------------- Physical Exam Details Patient Name: Date of Service: Daniel Chapman, Daniel BERT D. 10/03/2019 1:15 PM Medical Record Number: [id number] Patient Account Number: [id number] Date of Birth/Sex: Treating RN: November 28, 1931 (86 y.o. Daniel Chapman Primary Care Provider: [name] Other Clinician: Referring Provider: Treating Provider/Extender: Franki Cabot in Treatment: 0 Constitutional Patient is hypertensive.. Pulse regular and within target range for patient.Marland Kitchen Respirations regular, non-labored and within target range.. Temperature is normal and within the target range for the patient.Marland Kitchen Appears in no distress. Ears, Nose, Mouth, and Throat Patient is hard of hearing.. Cardiovascular Nonpalpable at the popliteal. There may be a faint femoral pulse although I had difficulty through his jeans. I did not feel pulses at his dorsalis pedis or posterior tibial. His foot is cool. Integumentary (Hair, Skin) Primary cutaneous issues are seen. There is certainly no erythema around the wound. Notes Wound exam; the patient has a quarter sized wound on the dorsal foot. The surface of this has some debris under illumination although I did not debride this today. Any degree of cellulitis he had was properly treated by the cephalexin prescribed by primary care. There was no palpable tenderness minimal erythema around the wound. As noted above his peripheral pulses are absent in the foot at both the dorsalis pedis and posterior tibial. I could not feel a popliteal pulse either Electronic  Signature(s) Signed: 10/03/2019 5:30:42 PM By: Linton Ham MD Entered By: Linton Ham on 10/03/2019 15:01:20 -------------------------------------------------------------------------------- Physician Orders Details Patient Name: Date  of Service: STA RR, Daniel BERT D. 10/03/2019 1:15 PM Medical Record Number: [id number] Patient Account Number: [id number] Date of Birth/Sex: Treating RN: [birth year] (86 y.o. Daniel Chapman Primary Care Provider: [name] Other Clinician: Referring Provider: Treating Provider/Extender: Franki Cabot in Treatment: 0 Verbal / Phone Orders: No Diagnosis Coding Follow-up Appointments Return Appointment in 2 weeks. Dressing Change Frequency Change Dressing every other day. Skin Barriers/Peri-Wound Care Skin Prep Wound Cleansing May shower and wash wound with soap and water. - with dressing changes only. Primary Wound Dressing Silver Collagen - moisten with hydrogel. Secondary Dressing Foam Border - bordered gauze. Edema Control Avoid standing for long periods of time Elevate legs to the level of the heart or above for 30 minutes daily and/or when sitting, a frequency of: - throughout the day. Exercise regularly Off-Loading Other: - try to ensure no pressure is on the wound on the foot. Patient Medications llergies: No Known Allergies A Notifications Medication Indication Start End lidocaine DOSE topical 4 % gel - gel topical applied only in clinic for debridement by MD. Electronic Signature(s) Signed: 10/03/2019 5:30:42 PM By: Linton Ham MD Signed: 10/03/2019 5:34:26 PM By: Deon Pilling Entered By: Deon Pilling on 10/03/2019 14:50:48 Prescription 10/03/2019 -------------------------------------------------------------------------------- Sarina Ser D. Linton Ham MD Patient Name: Provider: 06/11/1931 [id number] Date of Birth: NPI#: [name] [id number] Sex: DEA #: [phone] [id number] Phone #: License  #: [place] Patient Address: [facility] [place] [address] [place], [place] [address] [place],  [address] [phone] Allergies Name Reaction Severity No Known Allergies Medication Medication: Route: Strength: Form: lidocaine 4 % topical gel topical 4% gel Class: TOPICAL LOCAL ANESTHETICS Dose: Frequency / Time: Indication: gel topical applied only in clinic for debridement by MD. Number of Refills: Number of Units: 0 Generic Substitution: Start Date: End Date: One Time Use: Substitution Permitted No Note to Pharmacy: Hand Signature: Date(s): Electronic Signature(s) Signed: 10/03/2019 5:30:42 PM By: Linton Ham MD Signed: 10/03/2019 5:34:26 PM By: Deon Pilling Entered By: Deon Pilling on 10/03/2019 14:50:49 -------------------------------------------------------------------------------- Problem List Details Patient Name: Date of Service: [name], Daniel BERT D. 10/03/2019 1:15 PM Medical Record Number: [id number] Patient Account Number: [id number] Date of Birth/Sex: Treating RN: 1932/04/26 (86 y.o. 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 10/03/2019 1:15 PM Medical Record Number: [id number] Patient Account Number: [id number] Date of Birth/Sex: Treating RN: 09-Nov-1931 (86 y.o. Daniel Chapman Primary Care Provider: [name] Other Clinician: Referring Provider: Treating Provider/Extender: Franki Cabot in Treatment: 0 Subjective Chief Complaint Information obtained from Patient 10/03/2019; patient is here for a traumatic injury on the right dorsal foot History of Present Illness (HPI) ADMISSION 10/03/2019 This is a 86 year old man who traumatized his right foot when he got up in the middle of the night on a night light. This was in the middle of  April. Came to attention of his primary physician on 4/26. The area was red and open. He was given a course of cephalexin with some improvement. He was seen yesterday in follow-up again by primary care he was given another round of Keflex although noted that he was considerably better. They have been using Neosporin and a Band-Aid on the wound area. The patient has multi-infarct dementia and is really not able to respond to questions all that much. He does not appear to be in a lot of discomfort. Past medical history chronic A. fib on Eliquis, vascular dementia, hyperlipidemia, continued tobacco abuse, COPD, coronary artery disease and a history of oropharyngeal cancer for which she received radiation in 2012 ABI in our clinic was 0.41 Patient History Information obtained from Patient. Allergies No Known Allergies Family History Cancer - Mother, Heart Disease - Siblings, No family history of Diabetes, Hereditary Spherocytosis, Hypertension, Kidney Disease, Lung Disease, Seizures, Stroke, Thyroid Problems, Tuberculosis. Social History Current every day smoker, Marital Status - Married, Alcohol Use - Never, Drug Use - No History, Caffeine Use - Daily. Medical History Eyes Denies history of Cataracts, Glaucoma, Optic  Neuritis Ear/Nose/Mouth/Throat Denies history of Chronic sinus problems/congestion, Middle ear problems Hematologic/Lymphatic Denies history of Anemia, Hemophilia, Human Immunodeficiency Virus, Lymphedema, Sickle Cell Disease Respiratory Denies history of Aspiration, Asthma, Chronic Obstructive Pulmonary Disease (COPD), Pneumothorax, Sleep Apnea, Tuberculosis Cardiovascular Denies history of Angina, Arrhythmia, Congestive Heart Failure, Coronary Artery Disease, Deep Vein Thrombosis, Hypertension, Hypotension, Myocardial Infarction, Peripheral Arterial Disease, Peripheral Venous Disease, Phlebitis, Vasculitis Gastrointestinal Denies history of Cirrhosis , Colitis, Crohnoos, Hepatitis A, Hepatitis B, Hepatitis C Endocrine Denies history of Type I Diabetes, Type II Diabetes Genitourinary Denies history of End Stage Renal Disease Immunological Denies history of Lupus Erythematosus, Raynaudoos, Scleroderma Integumentary (Skin) Denies history of History of Burn Musculoskeletal Denies history of Gout, Rheumatoid Arthritis, Osteoarthritis, Osteomyelitis Neurologic Patient has history of Dementia Denies history of Neuropathy, Quadriplegia, Paraplegia, Seizure Disorder Oncologic Patient has history of Received Radiation - 2012 Psychiatric Denies history of Anorexia/bulimia, Confinement Anxiety Review of Systems (ROS) Constitutional Symptoms (General Health) Denies complaints or symptoms of Fatigue, Fever, Chills, Marked Weight Change. Eyes Complains or has symptoms of Glasses / Contacts. Denies complaints or symptoms of Dry Eyes, Vision Changes. Ear/Nose/Mouth/Throat Denies complaints or symptoms of Chronic sinus problems or rhinitis. Respiratory Denies complaints or symptoms of Chronic or frequent coughs, Shortness of Breath. Cardiovascular Denies complaints or symptoms of Chest pain. Gastrointestinal Denies complaints or symptoms of Frequent diarrhea, Nausea,  Vomiting. Endocrine Denies complaints or symptoms of Heat/cold intolerance. Genitourinary Denies complaints or symptoms of Frequent urination. Integumentary (Skin) Complains or has symptoms of Wounds. Musculoskeletal Denies complaints or symptoms of Muscle Pain, Muscle Weakness. Neurologic Denies complaints or symptoms of Numbness/parasthesias. Psychiatric Denies complaints or symptoms of Claustrophobia, Suicidal. Objective Constitutional Patient is hypertensive.. Pulse regular and within target range for patient.Marland Kitchen Respirations regular, non-labored and within target range.. Temperature is normal and within the target range for the patient.Marland Kitchen Appears in no distress. Vitals Time Taken: 1:45 PM, Height: 68 in, Source: Stated, Weight: 123 lbs, Source: Stated, BMI: 18.7, Temperature: 98.4 F, Pulse: 65 bpm, Respiratory Rate: 18 breaths/min, Blood Pressure: 148/84 mmHg. Ears, Nose, Mouth, and Throat Patient is hard of hearing.. Cardiovascular Nonpalpable at the popliteal. There may be a faint femoral pulse although I had difficulty through his jeans. I did not feel pulses at his dorsalis pedis or posterior tibial. His foot is cool. General Notes: Wound exam; the patient has a quarter sized wound  on the dorsal foot. The surface of this has some debris under illumination although I did not debride this today. Any degree of cellulitis he had was properly treated by the cephalexin prescribed by primary care. There was no palpable tenderness minimal erythema around the wound. As noted above his peripheral pulses are absent in the foot at both the dorsalis pedis and posterior tibial. I could not feel a popliteal pulse either Integumentary (Hair, Skin) Primary cutaneous issues are seen. There is certainly no erythema around the wound. Wound #1 status is Open. Original cause of wound was Trauma. The wound is located on the Right,Dorsal Foot. The wound measures 2.1cm length x 1.5cm width x 0.1cm  depth; 2.474cm^2 area and 0.247cm^3 volume. There is Fat Layer (Subcutaneous Tissue) Exposed exposed. There is no tunneling or undermining noted. There is a medium amount of serosanguineous drainage noted. There is medium (34-66%) granulation within the wound bed. There is a medium (34-66%) amount of necrotic tissue within the wound bed including Adherent Slough. Assessment Active Problems ICD-10 Laceration without foreign body, right foot, subsequent encounter Atherosclerosis of native arteries of right leg with ulceration of other part of foot Cellulitis of right lower limb Plan Follow-up Appointments: Return Appointment in 2 weeks. Dressing Change Frequency: Change Dressing every other day. Skin Barriers/Peri-Wound Care: Skin Prep Wound Cleansing: May shower and wash wound with soap and water. - with dressing changes only. Primary Wound Dressing: Silver Collagen - moisten with hydrogel. Secondary Dressing: Foam Border - bordered gauze. Edema Control: Avoid standing for long periods of time Elevate legs to the level of the heart or above for 30 minutes daily and/or when sitting, a frequency of: - throughout the day. Exercise regularly Off-Loading: Other: - try to ensure no pressure is on the wound on the foot. The following medication(s) was prescribed: lidocaine topical 4 % gel gel topical applied only in clinic for debridement by MD. was prescribed at facility 1. We will use silver collagen moistened with hydrogel and border foam this can be changed every second day 2. The major concern here is the blood flow issue and I tried to carefully explain this to his wife. Ordinarily I would go ahead and do standard noninvasive arterial tests including arterial Dopplers ABIs and TBI's either at vein and vascular or perhaps interventional cardiology however I am not sure we could get him through these tests and I elected to forego these as long as the wound progresses towards healing.  However if it worsens or expands these test may be necessary if we can get them done. 3. There is continued tobacco abuse here no doubt contributing to possible nonhealing PAD etc. I did not discuss this with his wife today. 4. The patient has been treated with Keflex indeed he is still on this. Apparently there is a marked improvement in the condition of the periwound after treatment with antibiotics. I did not alter these. Any degree of cellulitis he had seems adequately treated I spent 30 minutes in review of this patient's record, face-to-face evaluation and preparation of this record Electronic Signature(s) Signed: 10/03/2019 5:30:42 PM By: Linton Ham MD Entered By: Linton Ham on 10/03/2019 15:04:11 -------------------------------------------------------------------------------- HxROS Details Patient Name: Date of Service: Daniel Chapman, Daniel BERT D. 10/03/2019 1:15 PM Medical Record Number: [id number] Patient Account Number: [id number] Date of Birth/Sex: Treating RN: [birth year] (86 y.o. Daniel Chapman Primary Care Provider: [name] Other Clinician: Referring Provider: Treating Provider/Extender: Franki Cabot in Treatment: 0 Information  Obtained From Patient Constitutional Symptoms (General Health) Complaints and Symptoms: Negative for: Fatigue; Fever; Chills; Marked Weight Change Eyes Complaints and Symptoms: Positive for: Glasses / Contacts Negative for: Dry Eyes; Vision Changes Medical History: Negative for: Cataracts; Glaucoma; Optic Neuritis Ear/Nose/Mouth/Throat Complaints and Symptoms: Negative for: Chronic sinus problems or rhinitis Medical History: Negative for: Chronic sinus problems/congestion; Middle ear problems Respiratory Complaints and Symptoms: Negative for: Chronic or frequent coughs; Shortness of Breath Medical History: Negative for: Aspiration; Asthma; Chronic Obstructive Pulmonary Disease (COPD); Pneumothorax; Sleep  Apnea; Tuberculosis Cardiovascular Complaints and Symptoms: Negative for: Chest pain Medical History: Negative for: Angina; Arrhythmia; Congestive Heart Failure; Coronary Artery Disease; Deep Vein Thrombosis; Hypertension; Hypotension; Myocardial Infarction; Peripheral Arterial Disease; Peripheral Venous Disease; Phlebitis; Vasculitis Gastrointestinal Complaints and Symptoms: Negative for: Frequent diarrhea; Nausea; Vomiting Medical History: Negative for: Cirrhosis ; Colitis; Crohns; Hepatitis A; Hepatitis B; Hepatitis C Endocrine Complaints and Symptoms: Negative for: Heat/cold intolerance Medical History: Negative for: Type I Diabetes; Type II Diabetes Genitourinary Complaints and Symptoms: Negative for: Frequent urination Medical History: Negative for: End Stage Renal Disease Integumentary (Skin) Complaints and Symptoms: Positive for: Wounds Medical History: Negative for: History of Burn Musculoskeletal Complaints and Symptoms: Negative for: Muscle Pain; Muscle Weakness Medical History: Negative for: Gout; Rheumatoid Arthritis; Osteoarthritis; Osteomyelitis Neurologic Complaints and Symptoms: Negative for: Numbness/parasthesias Medical History: Positive for: Dementia Negative for: Neuropathy; Quadriplegia; Paraplegia; Seizure Disorder Psychiatric Complaints and Symptoms: Negative for: Claustrophobia; Suicidal Medical History: Negative for: Anorexia/bulimia; Confinement Anxiety Hematologic/Lymphatic Medical History: Negative for: Anemia; Hemophilia; Human Immunodeficiency Virus; Lymphedema; Sickle Cell Disease Immunological Medical History: Negative for: Lupus Erythematosus; Raynauds; Scleroderma Oncologic Medical History: Positive for: Received Radiation - 2012 Immunizations Pneumococcal Vaccine: Received Pneumococcal Vaccination: Yes Implantable Devices None Family and Social History Cancer: Yes - Mother; Diabetes: No; Heart Disease: Yes - Siblings;  Hereditary Spherocytosis: No; Hypertension: No; Kidney Disease: No; Lung Disease: No; Seizures: No; Stroke: No; Thyroid Problems: No; Tuberculosis: No; Current every day smoker; Marital Status - Married; Alcohol Use: Never; Drug Use: No History; Caffeine Use: Daily; Financial Concerns: No; Food, Clothing or Shelter Needs: No; Support System Lacking: No; Transportation Concerns: No Electronic Signature(s) Signed: 10/03/2019 5:04:33 PM By: Carlene Coria RN Signed: 10/03/2019 5:30:42 PM By: Linton Ham MD Entered By: Carlene Coria on 10/03/2019 13:54:06 -------------------------------------------------------------------------------- SuperBill Details Patient Name: Date of Service: Daniel Chapman, Daniel BERT D. 10/03/2019 Medical Record Number: [id number] Patient Account Number: [id number] Date of Birth/Sex: Treating RN: May 15, 1932 (86 y.o. Daniel Chapman Primary Care Provider: [name] Other Clinician: Referring Provider: Treating Provider/Extender: Franki Cabot in Treatment: 0 Diagnosis Coding ICD-10 Codes Code Description [phone] Laceration without foreign body, right foot, subsequent encounter I70.235 Atherosclerosis of native arteries of right leg with ulceration of other part of foot L03.115 Cellulitis of right lower limb Facility Procedures CPT4 Code: [id number] Description: 99214 - WOUND CARE VISIT-LEV 4 EST PT Modifier: Quantity: 1 Physician Procedures : CPT4 Code Description Modifier [id number] WC PHYS LEVEL 3 NEW PT ICD-10 Diagnosis Description [id number] Laceration without foreign body, right foot, subsequent encounter I70.235 Atherosclerosis of native arteries of right leg with ulceration of other part  of foot L03.115 Cellulitis of right lower limb Quantity: 1 Electronic Signature(s) Signed: 10/03/2019 5:30:42 PM By: Linton Ham MD Signed: 10/03/2019 5:34:26 PM By: Deon Pilling Entered By: Deon Pilling on 10/03/2019 15:38:56

## 2019-10-04 DIAGNOSIS — L97511 Non-pressure chronic ulcer of other part of right foot limited to breakdown of skin: Secondary | ICD-10-CM | POA: Diagnosis not present

## 2019-10-11 ENCOUNTER — Telehealth: Payer: Self-pay

## 2019-10-11 DIAGNOSIS — E7800 Pure hypercholesterolemia, unspecified: Secondary | ICD-10-CM

## 2019-10-11 DIAGNOSIS — J449 Chronic obstructive pulmonary disease, unspecified: Secondary | ICD-10-CM

## 2019-10-11 NOTE — Telephone Encounter (Signed)
Referral placed.

## 2019-10-11 NOTE — Telephone Encounter (Signed)
Per written referral from PCP, requesting referral in Epic for Daniel Chapman to chronic care management pharmacy services for the following conditions:   COPD [J44.9]  Hyperlipidemia [E78.5]  Debbora Dus, PharmD Clinical Pharmacist [facility] [phone]

## 2019-10-15 ENCOUNTER — Other Ambulatory Visit: Payer: Self-pay

## 2019-10-15 ENCOUNTER — Encounter (HOSPITAL_BASED_OUTPATIENT_CLINIC_OR_DEPARTMENT_OTHER): Payer: Medicare Other | Admitting: Internal Medicine

## 2019-10-15 DIAGNOSIS — L03115 Cellulitis of right lower limb: Secondary | ICD-10-CM | POA: Diagnosis not present

## 2019-10-15 DIAGNOSIS — Z7901 Long term (current) use of anticoagulants: Secondary | ICD-10-CM | POA: Diagnosis not present

## 2019-10-15 DIAGNOSIS — L97512 Non-pressure chronic ulcer of other part of right foot with fat layer exposed: Secondary | ICD-10-CM | POA: Diagnosis not present

## 2019-10-15 DIAGNOSIS — Z85818 Personal history of malignant neoplasm of other sites of lip, oral cavity, and pharynx: Secondary | ICD-10-CM | POA: Diagnosis not present

## 2019-10-15 DIAGNOSIS — L97519 Non-pressure chronic ulcer of other part of right foot with unspecified severity: Secondary | ICD-10-CM | POA: Diagnosis not present

## 2019-10-15 DIAGNOSIS — S91311D Laceration without foreign body, right foot, subsequent encounter: Secondary | ICD-10-CM | POA: Diagnosis not present

## 2019-10-15 DIAGNOSIS — I2510 Atherosclerotic heart disease of native coronary artery without angina pectoris: Secondary | ICD-10-CM | POA: Diagnosis not present

## 2019-10-15 DIAGNOSIS — I70235 Atherosclerosis of native arteries of right leg with ulceration of other part of foot: Secondary | ICD-10-CM | POA: Diagnosis not present

## 2019-10-15 DIAGNOSIS — E785 Hyperlipidemia, unspecified: Secondary | ICD-10-CM | POA: Diagnosis not present

## 2019-10-15 DIAGNOSIS — I482 Chronic atrial fibrillation, unspecified: Secondary | ICD-10-CM | POA: Diagnosis not present

## 2019-10-15 DIAGNOSIS — J449 Chronic obstructive pulmonary disease, unspecified: Secondary | ICD-10-CM | POA: Diagnosis not present

## 2019-10-17 ENCOUNTER — Other Ambulatory Visit: Payer: Self-pay

## 2019-10-17 ENCOUNTER — Ambulatory Visit: Payer: Medicare Other

## 2019-10-17 ENCOUNTER — Encounter (HOSPITAL_BASED_OUTPATIENT_CLINIC_OR_DEPARTMENT_OTHER): Payer: Medicare Other | Admitting: Internal Medicine

## 2019-10-17 DIAGNOSIS — I4891 Unspecified atrial fibrillation: Secondary | ICD-10-CM

## 2019-10-17 DIAGNOSIS — F0390 Unspecified dementia without behavioral disturbance: Secondary | ICD-10-CM

## 2019-10-17 NOTE — Progress Notes (Signed)
CHICK, FELGAR ([id number]) Visit Report for 10/15/2019 HPI Details Patient Name: Date of Service: [place], [name] 10/15/2019 3:30 PM Medical Record Number: [id number] Patient Account Number: [id number] Date of Birth/Sex: Treating RN: 08/31/1931 (86 y.o. Daniel Chapman) Daniel Chapman Primary Care Provider: [name] Other Clinician: Referring Provider: Treating Provider/Extender: Franki Cabot in Treatment: 1 History of Present Illness HPI Description: ADMISSION 10/03/2019 This is a 86 year old man who traumatized his right foot when he got up in the middle of the night on a night light. This was in the middle of April. Came to attention of his primary physician on 4/26. The area was red and open. He was given a course of cephalexin with some improvement. He was seen yesterday in follow-up again by primary care he was given another round of Keflex although noted that he was considerably better. They have been using Neosporin and a Band-Aid on the wound area. The patient has multi-infarct dementia and is really not able to respond to questions all that much. He does not appear to be in a lot of discomfort. Past medical history chronic A. fib on Eliquis, vascular dementia, hyperlipidemia, continued tobacco abuse, COPD, coronary artery disease and a history of oropharyngeal cancer for which she received radiation in 2012 ABI in our clinic was 0.41 5/18; wound on the right dorsal foot from trauma. He likely has severe PAD. He gets cramps at night according to his wife. We are using silver collagen. He is somewhat disinhibited and loud. It is difficult to see him getting through noninvasive arterial studies and certainly not an angiogram without significant anesthesia Electronic Signature(s) Signed: 10/17/2019 12:52:55 PM By: Linton Ham MD Entered By: Linton Ham on 10/15/2019  17:59:22 -------------------------------------------------------------------------------- Physical Exam Details Patient Name: Date of Service: Daniel Chapman, [name] 10/15/2019 3:30 PM Medical Record Number: [id number] Patient Account Number: [id number] Date of Birth/Sex: Treating RN: 1932/04/04 (86 y.o. Oval Linsey Primary Care Provider: [name] Other Clinician: Referring Provider: Treating Provider/Extender: Benson Setting Weeks in Treatment: 1 Constitutional Sitting or standing Blood Pressure is within target range for patient.. Pulse regular and within target range for patient.Marland Kitchen Respirations regular, non-labored and within target range.. Temperature is normal and within the target range for the patient.Marland Kitchen Appears in no distress. Cardiovascular I cannot feel a popliteal pulse on him and no femoral pulse through his jeans. Pedal pulses absent bilaterally.. Integumentary (Hair, Skin) No significant infection around the wound. Psychiatric Advanced dementia. Notes Wound exam; the patient has a smaller wound on the right dorsal foot. A lot of this looks healthier and I think there is been some decent healing here. No evidence of surrounding infection Electronic Signature(s) Signed: 10/17/2019 12:52:55 PM By: Linton Ham MD Entered By: Linton Ham on 10/15/2019 18:00:33 -------------------------------------------------------------------------------- Physician Orders Details Patient Name: Date of Service: Daniel Chapman, [name] 10/15/2019 3:30 PM Medical Record Number: [id number] Patient Account Number: [id number] Date of Birth/Sex: Treating RN: 1931/11/04 (86 y.o. Oval Linsey Primary Care Provider: [name] Other Clinician: Referring Provider: Treating Provider/Extender: Franki Cabot in Treatment: 1 Verbal / Phone Orders: No Diagnosis Coding ICD-10 Coding Code Description [id number] Laceration without foreign body, right  foot, subsequent encounter I70.235 Atherosclerosis of native arteries of right leg with ulceration of other part of foot L03.115 Cellulitis of right lower limb Follow-up Appointments Return Appointment in 2 weeks. Dressing Change Frequency Change Dressing every other day. Skin Barriers/Peri-Wound Care Skin  Prep Wound Cleansing May shower and wash wound with soap and water. - with dressing changes only. Primary Wound Dressing Silver Collagen - moisten with hydrogel. Secondary Dressing Foam Border - bordered gauze. Edema Control Avoid standing for long periods of time Elevate legs to the level of the heart or above for 30 minutes daily and/or when sitting, a frequency of: - throughout the day. Exercise regularly Off-Loading Other: - try to ensure no pressure is on the wound on the foot. Electronic Signature(s) Signed: 10/15/2019 5:13:38 PM By: Daniel Coria RN Signed: 10/17/2019 12:52:55 PM By: Linton Ham MD Entered By: Daniel Chapman on 10/15/2019 15:43:46 -------------------------------------------------------------------------------- Problem List Details Patient Name: Date of Service: Daniel Chapman, [name] 10/15/2019 3:30 PM Medical Record Number: [id number] Patient Account Number: [id number] Date of Birth/Sex: Treating RN: September 02, 1931 (86 y.o. Daniel Chapman) Daniel Chapman Primary Care Provider: Other Clinician: [place], [place] Referring Provider: Treating Provider/Extender: Franki Cabot in Treatment: 1 Active Problems ICD-10 Encounter Code Description Active Date MDM Diagnosis S91.311D Laceration without foreign body, right foot, subsequent encounter 10/03/2019 No Yes I70.235 Atherosclerosis of native arteries of right leg with ulceration of other part of 10/03/2019 No Yes foot L03.115 Cellulitis of right lower limb 10/03/2019 No Yes Inactive Problems Resolved Problems Electronic Signature(s) Signed: 10/17/2019 12:52:55 PM By: Linton Ham MD Previous Signature:  10/15/2019 5:13:38 PM Version By: Daniel Coria RN Entered By: Linton Ham on 10/15/2019 17:58:07 -------------------------------------------------------------------------------- Progress Note Details Patient Name: Date of Service: Daniel Chapman, [name] 10/15/2019 3:30 PM Medical Record Number: [id number] Patient Account Number: [id number] Date of Birth/Sex: Treating RN: Feb 25, 1932 (86 y.o. Oval Linsey Primary Care Provider: [name] Other Clinician: Referring Provider: Treating Provider/Extender: Franki Cabot in Treatment: 1 Subjective History of Present Illness (HPI) ADMISSION 10/03/2019 This is a 86 year old man who traumatized his right foot when he got up in the middle of the night on a night light. This was in the middle of April. Came to attention of his primary physician on 4/26. The area was red and open. He was given a course of cephalexin with some improvement. He was seen yesterday in follow-up again by primary care he was given another round of Keflex although noted that he was considerably better. They have been using Neosporin and a Band-Aid on the wound area. The patient has multi-infarct dementia and is really not able to respond to questions all that much. He does not appear to be in a lot of discomfort. Past medical history chronic A. fib on Eliquis, vascular dementia, hyperlipidemia, continued tobacco abuse, COPD, coronary artery disease and a history of oropharyngeal cancer for which she received radiation in 2012 ABI in our clinic was 0.41 5/18; wound on the right dorsal foot from trauma. He likely has severe PAD. He gets cramps at night according to his wife. We are using silver collagen. He is somewhat disinhibited and loud. It is difficult to see him getting through noninvasive arterial studies and certainly not an angiogram without significant anesthesia Objective Constitutional Sitting or standing Blood Pressure is within target  range for patient.. Pulse regular and within target range for patient.Marland Kitchen Respirations regular, non-labored and within target range.. Temperature is normal and within the target range for the patient.Marland Kitchen Appears in no distress. Vitals Time Taken: 4:19 PM, Height: 68 in, Weight: 123 lbs, BMI: 18.7, Temperature: 97.5 F, Pulse: 65 bpm, Respiratory Rate: 18 breaths/min, Blood Pressure: 123/70 mmHg. Cardiovascular I cannot feel a popliteal pulse on  him and no femoral pulse through his jeans. Pedal pulses absent bilaterally.Marland Kitchen Psychiatric Advanced dementia. General Notes: Wound exam; the patient has a smaller wound on the right dorsal foot. A lot of this looks healthier and I think there is been some decent healing here. No evidence of surrounding infection Integumentary (Hair, Skin) No significant infection around the wound. Wound #1 status is Open. Original cause of wound was Trauma. The wound is located on the Right,Dorsal Foot. The wound measures 1.2cm length x 1.1cm width x 0.1cm depth; 1.037cm^2 area and 0.104cm^3 volume. There is Fat Layer (Subcutaneous Tissue) Exposed exposed. There is no tunneling or undermining noted. There is a medium amount of serosanguineous drainage noted. The wound margin is flat and intact. There is large (67-100%) red, pink granulation within the wound bed. There is a small (1-33%) amount of necrotic tissue within the wound bed including Adherent Slough. Assessment Active Problems ICD-10 Laceration without foreign body, right foot, subsequent encounter Atherosclerosis of native arteries of right leg with ulceration of other part of foot Cellulitis of right lower limb Plan Follow-up Appointments: Return Appointment in 2 weeks. Dressing Change Frequency: Change Dressing every other day. Skin Barriers/Peri-Wound Care: Skin Prep Wound Cleansing: May shower and wash wound with soap and water. - with dressing changes only. Primary Wound Dressing: Silver Collagen -  moisten with hydrogel. Secondary Dressing: Foam Border - bordered gauze. Edema Control: Avoid standing for long periods of time Elevate legs to the level of the heart or above for 30 minutes daily and/or when sitting, a frequency of: - throughout the day. Exercise regularly Off-Loading: Other: - try to ensure no pressure is on the wound on the foot. 1. Continue silver collagen. There has been some improvement 2. It is difficult seeing this man get through noninvasive studies and certainly not an angiogram. I talked to his wife frankly about this today. Hopefully we can keep his wound going in the right direction and get this closed. At least for a period of time this would obviate any thought about aggressive management of likely severe PAD Electronic Signature(s) Signed: 10/17/2019 12:52:55 PM By: Linton Ham MD Entered By: Linton Ham on 10/15/2019 18:01:43 -------------------------------------------------------------------------------- SuperBill Details Patient Name: Date of Service: Daniel Chapman, [name] 10/15/2019 Medical Record Number: [id number] Patient Account Number: [id number] Date of Birth/Sex: Treating RN: [birth year] (86 y.o. Oval Linsey Primary Care Provider: [name] Other Clinician: Referring Provider: Treating Provider/Extender: Franki Cabot in Treatment: 1 Diagnosis Coding ICD-10 Codes Code Description [id number] Laceration without foreign body, right foot, subsequent encounter I70.235 Atherosclerosis of native arteries of right leg with ulceration of other part of foot L03.115 Cellulitis of right lower limb Facility Procedures CPT4 Code: [id number] Description: 99213 - WOUND CARE VISIT-LEV 3 EST PT Modifier: Quantity: 1 Physician Procedures : CPT4 Code Description Modifier [id number] - WC PHYS LEVEL 3 - EST PT ICD-10 Diagnosis Description [id number] Laceration without foreign body, right foot, subsequent encounter I70.235  Atherosclerosis of native arteries of right leg with ulceration of  other part of foot Quantity: 1 Electronic Signature(s) Signed: 10/17/2019 12:52:55 PM By: Linton Ham MD Entered By: Linton Ham on 10/15/2019 18:02:16

## 2019-10-17 NOTE — Patient Instructions (Signed)
Oct 17, 2019  Dear Daniel Chapman,  It was a pleasure meeting your wife during our initial appointment on Oct 17, 2019. Below is a summary of the goals we discussed and components of chronic care management. Please contact me anytime with questions or concerns.   Visit Information  Goals Addressed            This Visit's Progress   . Patient Stated   On track    Starting 01/05/2018, I will continue to take medications as prescribed.     . Pharmacy Care Plan       CARE PLAN ENTRY  Current Barriers:  . Chronic Disease Management support, education, and care coordination needs related to  Dementia and AFIB  Dementia . Pharmacist Clinical Goal(s): o Over the next 6 months, patient will work with PharmD and providers to ensure medications are effective and outweigh risk of side effects . Current regimen:   Donepezil 10 mg - 1 tablet daily (started 04/23/18)  Memantine 10 mg - 1 tablet twice daily (started 02/12/20) . Interventions: o Discussed symptoms: Per patient's wife, patient's mental status continues to decline, appetite is poor, unable to do many activities around the house o Reviewed risks: Donepezil may cause weight loss, nausea, constipation, and insomnia. Memantine is usually better tolerated but may still cause some abdominal discomfort.  . Patient self care activities - Over the next 6 months, patient will: o Consider discussion with neurologist about benefits versus potential risks and whether it is best to continue current therapy considering weight loss/poor appetite.   Atrial Fibrillation . Pharmacist Clinical Goal(s) o Over the next 6 months, patient will work with PharmD and providers to ensure safe use of anticoagulation . Current regimen:  o Apixaban 2.5 mg - 1 tablet twice daily . Interventions: o Assessed adherence and abnormal bruising/bleeding . Patient self care activities - Over the next 6 months, patient will: o Continue medication as prescribed twice  daily o Avoid missed doses to prevent blood clotting o Call with any abnormal bruising or bleeding  o Avoid over the counter non-steroidal anti-inflammatories (NSAID) including aspirin, Aleve, Advil, Goody's powder, Excedrin products, Alka-seltzer  Initial goal documentation      Mr. Supak was given information about Chronic Care Management services today including:  1. CCM service includes personalized support from designated clinical staff supervised by his physician, including individualized plan of care and coordination with other care providers 2. 24/7 contact phone numbers for assistance for urgent and routine care needs. 3. Standard insurance, coinsurance, copays and deductibles apply for chronic care management only during months in which we provide at least 20 minutes of these services. Most insurances cover these services at 100%, however patients may be responsible for any copay, coinsurance and/or deductible if applicable. This service may help you avoid the need for more expensive face-to-face services. 4. Only one practitioner may furnish and bill the service in a calendar month. 5. The patient may stop CCM services at any time (effective at the end of the month) by phone call to the office staff.  Patient agreed to services and verbal consent obtained.   The patient verbalized understanding of instructions provided today and agreed to receive a mailed copy of patient instruction and/or educational materials. Telephone follow up appointment with pharmacy team member scheduled for: 04/21/20 at 9:00 AM (telephone)  Debbora Dus, PharmD Clinical Pharmacist [facility] [phone]  Dementia Caregiver Guide Dementia is a term used to describe a  number of symptoms that affect memory and thinking. The most common symptoms include:  Memory loss.  Trouble with language and communication.  Trouble concentrating.  Poor judgment.  Problems with  reasoning.  Child-like behavior and language.  Extreme anxiety.  Angry outbursts.  Wandering from home or public places. Dementia usually gets worse slowly over time. In the early stages, people with dementia can stay independent and safe with some help. In later stages, they need help with daily tasks such as dressing, grooming, and using the bathroom. How to help the person with dementia cope Dementia can be frightening and confusing. Here are some tips to help the person with dementia cope with changes caused by the disease. General tips  Keep the person on track with his or her routine.  Try to identify areas where the person may need help.  Be supportive, patient, calm, and encouraging.  Gently remind the person that adjusting to changes takes time.  Help with the tasks that the person has asked for help with.  Keep the person involved in daily tasks and decisions as much as possible.  Encourage conversation, but try not to get frustrated or harried if the person struggles to find words or does not seem to appreciate your help. Communication tips  When the person is talking or seems frustrated, make eye contact and hold the person's hand.  Ask specific questions that need yes or no answers.  Use simple words, short sentences, and a calm voice. Only give one direction at a time.  When offering choices, limit them to just 1 or 2.  Avoid correcting the person in a negative way.  If the person is struggling to find the right words, gently try to help him or her. How to recognize symptoms of stress Symptoms of stress in caregivers include:  Feeling frustrated or angry with the person with dementia.  Denying that the person has dementia or that his or her symptoms will not improve.  Feeling hopeless and unappreciated.  Difficulty sleeping.  Difficulty concentrating.  Feeling anxious, irritable, or depressed.  Developing stress-related health problems.  Feeling  like you have too little time for your own life. Follow these instructions at home:   Make sure that you and the person you are caring for: ? Get regular sleep. ? Exercise regularly. ? Eat regular, nutritious meals. ? Drink enough fluid to keep your urine clear or pale yellow. ? Take over-the-counter and prescription medicines only as told by your health care providers. ? Attend all scheduled health care appointments.  Join a support group with others who are caregivers.  Ask about respite care resources so that you can have a regular break from the stress of caregiving.  Look for signs of stress in yourself and in the person you are caring for. If you notice signs of stress, take steps to manage it.  Consider any safety risks and take steps to avoid them.  Organize medications in a pill box for each day of the week.  Create a plan to handle any legal or financial matters. Get legal or financial advice if needed.  Keep a calendar in a central location to remind the person of appointments or other activities. Tips for reducing the risk of injury  Keep floors clear of clutter. Remove rugs, magazine racks, and floor lamps.  Keep hallways well lit, especially at night.  Put a handrail and nonslip mat in the bathtub or shower.  Put childproof locks on cabinets that contain dangerous  items, such as medicines, alcohol, guns, toxic cleaning items, sharp tools or utensils, matches, and lighters.  Put the locks in places where the person cannot see or reach them easily. This will help ensure that the person does not wander out of the house and get lost.  Be prepared for emergencies. Keep a list of emergency phone numbers and addresses in a convenient area.  Remove car keys and lock garage doors so that the person does not try to get in the car and drive.  Have the person wear a bracelet that tracks locations and identifies the person as having memory problems. This should be worn at  all times for safety. Where to find support: Many individuals and organizations offer support. These include:  Support groups for people with dementia and for caregivers.  Counselors or therapists.  Home health care services.  Adult day care centers. Where to find more information Alzheimer's Association: [employer] Contact a health care provider if:  The person's health is rapidly getting worse.  You are no longer able to care for the person.  Caring for the person is affecting your physical and emotional health.  The person threatens himself or herself, you, or anyone else. Summary  Dementia is a term used to describe a number of symptoms that affect memory and thinking.  Dementia usually gets worse slowly over time.  Take steps to reduce the person's risk of injury, and to plan for future care.  Caregivers need support, relief from caregiving, and time for their own lives. This information is not intended to replace advice given to you by your health care provider. Make sure you discuss any questions you have with your health care provider. Document Revised: 04/28/2017 Document Reviewed: 04/19/2016 Elsevier Patient Education  2020 [employer].

## 2019-10-17 NOTE — Progress Notes (Signed)
I have collaborated with the care management provider regarding care management and care coordination activities outlined in this encounter and have reviewed this encounter including documentation in the note and care plan. I am certifying that I agree with the content of this note and encounter as supervising physician. Emara Lichter MD  

## 2019-10-17 NOTE — Chronic Care Management (AMB) (Signed)
Chronic Care Management Pharmacy  Name: Daniel Chapman  MRN: [id number] DOB: 1931-07-23   Chief Complaint/ HPI  Daniel Chapman,  86 y.o., male presents for their Initial CCM visit with the clinical pharmacist via telephone. Visit completed with patient's wife, Daniel Chapman.   PCP : Abner Greenspan, MD  Their chronic conditions include: AFIB, COPD, dementia, osteoporosis, BPH, hyperlipidemia, tobacco use, anxiety  Patient/caregiver concerns: worried about possible stent placement due to blockage in leg (managed by Dr. Dellia Nims, wound specialist); wound is improving, applying hydrogel/banadge to the wound   Office Visits:  10/02/19: [facility] - right foot wound/cellulitits LLE, completed Keflex, wound is not worsening, some improvement in redness/swelling  01/21/19: [facility] - consider adding Namenda, cont current meds, increase fluids and protein  Consult Visit:  09/05/19: Dementia - memantine added to donepezil at last visit, stroke 2018, FH of alzheimer's dx, continues tobacco 1/2 PPD, hard of hearing and does not wear his hearing aids, cont current meds; MMSE - 18  05/02/19: Cardiology - AFIB rate controlled, dizziness, concern for braydcardia, no meds to slow FR, 2 week zio patch, cont current meds - no abnormal results  No Known Allergies  Medications: Outpatient Encounter Medications as of 10/17/2019  Medication Sig  . apixaban (ELIQUIS) 2.5 MG TABS tablet Take 1 tablet (2.5 mg total) by mouth 2 (two) times daily.  . calcium carbonate (TUMS EX) 750 MG chewable tablet Chew 1 tablet by mouth daily. Reported on 09/15/2015  . cholecalciferol (VITAMIN D) 1000 UNITS tablet Take 1,000 Units by mouth daily. Reported on 09/15/2015  . donepezil (ARICEPT) 10 MG tablet Take 1 tablet (10 mg total) by mouth at bedtime.  . memantine (NAMENDA) 10 MG tablet Take 1 tablet (10 mg total) by mouth 2 (two) times daily.  . rosuvastatin (CRESTOR) 5 MG tablet TAKE 1 TABLET BY MOUTH 3  TIMES A WEEK. TAKE ON  MONDAY,  [place], AND  FRIDAYS.  . cephALEXin (KEFLEX) 500 MG capsule Take 1 capsule (500 mg total) by mouth 3 (three) times daily. (Patient not taking: Reported on 10/17/2019)   No facility-administered encounter medications on file as of 10/17/2019.   Current Diagnosis/Assessment:   Emergency planning/management officer Strain: Low Risk   . Difficulty of Paying Living Expenses: Not hard at all   Goals Addressed            This Visit's Progress   . Patient Stated   On track    Starting 01/05/2018, I will continue to take medications as prescribed.     . Pharmacy Care Plan       CARE PLAN ENTRY  Current Barriers:  . Chronic Disease Management support, education, and care coordination needs related to  Dementia and AFIB  Dementia . Pharmacist Clinical Goal(s): o Over the next 6 months, patient will work with PharmD and providers to ensure medications are effective and outweigh risk of side effects . Current regimen:   Donepezil 10 mg - 1 tablet daily (started 04/23/18)  Memantine 10 mg - 1 tablet twice daily (started 02/12/20) . Interventions: o Discussed symptoms: Per patient's wife, patient's mental status continues to decline, appetite is poor, unable to do many activities around the house o Reviewed risks: Donepezil may cause weight loss, nausea, constipation, and insomnia. Memantine is usually better tolerated but may still cause some abdominal discomfort.  . Patient self care activities - Over the next 6 months, patient will: o Consider discussion with neurologist about benefits versus potential risks and whether it is  best to continue current therapy considering weight loss/poor appetite.   Atrial Fibrillation . Pharmacist Clinical Goal(s) o Over the next 6 months, patient will work with PharmD and providers to ensure safe use of anticoagulation . Current regimen:  o Apixaban 2.5 mg - 1 tablet twice daily . Interventions: o Assessed adherence and abnormal bruising/bleeding . Patient self care  activities - Over the next 6 months, patient will: o Continue medication as prescribed twice daily o Avoid missed doses to prevent blood clotting o Call with any abnormal bruising or bleeding  o Avoid over the counter non-steroidal anti-inflammatories (NSAID) including aspirin, Aleve, Advil, Goody's powder, Excedrin products, Alka-seltzer  Initial goal documentation      Hyperlipidemia   Lipid Panel     Component Value Date/Time   CHOL 118 01/11/2019 0908   CHOL 108 04/10/2017 0811   TRIG 113.0 01/11/2019 0908   HDL 34.40 (L) 01/11/2019 0908   HDL 32 (L) 04/10/2017 0811   CHOLHDL 3 01/11/2019 0908   VLDL 22.6 01/11/2019 0908   LDLCALC 61 01/11/2019 0908   LDLCALC 57 04/10/2017 0811   LABVLDL 19 04/10/2017 0811    LDL goal < 100 Patient has failed these meds in past: none reported Patient is currently controlled on the following medications:   Rosuvastatin 5 mg - 1 tablet on Monday, Wed, Friday  We discussed: confirmed adherence   Plan: Continue current medications  AFIB   CBC Latest Ref Rng & Units 01/11/2019 04/05/2018 01/05/2018  WBC 4.0 - 10.5 K/uL 6.9 8.0 7.3  Hemoglobin 13.0 - 17.0 g/dL 13.6 12.4(L) 13.0  Hematocrit 39.0 - 52.0 % 41.8 37.2(L) 40.1  Platelets 150.0 - 400.0 K/uL 193.0 211 212.0   Kidney Function Lab Results  Component Value Date/Time   CREATININE 0.84 01/11/2019 09:08 AM   CREATININE 0.82 04/05/2018 09:59 AM   GFR 86.78 01/11/2019 09:08 AM   GFRNONAA 81 04/05/2018 09:59 AM   GFRAA 94 04/05/2018 09:59 AM   Patient is currently rate controlled.  Patient has failed these meds in past: none  Patient is currently controlled on the following medications:   Eliquis 2.5 mg - 1 tablet BID  We discussed:  Denies abnormal bruising/bleeding, confirms adherence  Plan: Continue current medications  Osteoporosis    Patient has failed these meds in past: none Patient is currently controlled on the following medications:   Calcium carbonate 750 mg -  1 tablet daily  Vitamin D 1000 IU - 1 tablet daily  Plan: Continue current medications  Dementia   Followed by neurology Patient has failed these meds in past: none  Patient is currently uncontrolled on the following medications:   Donepezil 10 mg - 1 tablet daily (started 04/23/18)  Memantine 10 mg - 1 tablet twice daily (started 02/12/20)  We discussed: Per wife - Main problem is comprehension/unable to understand her and do normal activities he used to enjoy. Pt unable to do much around the house, poor appetite, not eating balanced meals. He is able to bathe and feed himself. No falls. She has not noticed any improvement with donepezil or memantine, only further decline.   Plan: Continue current medications; Consider discussion with neurologist about benefits versus potential risks and whether it is best to continue current therapy considering weight loss/poor appetite.   Medication Management  OTCs: discussed starting multivitamin but pt is unable to swallow large pills   Pharmacy/Benefits: [facility] + Mail order (maintenance)/[employer]  Adherence: pillbox (morning and bedtime)  Social support: wife is primary caregiver  Affordability: no cost concerns  CCM Follow Up: 04/21/20 at 9:00 AM (telephone)  Debbora Dus, PharmD Clinical Pharmacist [facility] [address] [phone]

## 2019-10-18 NOTE — Progress Notes (Signed)
Daniel Chapman, Daniel Chapman ([id number]) Visit Report for 10/15/2019 Arrival Information Details Patient Name: Date of Service: [place], [name] 10/15/2019 3:30 PM Medical Record Number: [id number] Patient Account Number: [id number] Date of Birth/Sex: Treating RN: 08/16/1931 (86 y.o. Daniel Chapman Primary Care Jobanny Mavis: [name] Other Clinician: Referring Neema Fluegge: Treating Adama Ivins/Extender: Franki Cabot in Treatment: 1 Visit Information History Since Last Visit Added or deleted any medications: No Patient Arrived: Ambulatory Any new allergies or adverse reactions: No Arrival Time: 16:16 Had a fall or experienced change in No Accompanied By: alone activities of daily living that may affect Transfer Assistance: None risk of falls: Patient Identification Verified: Yes Signs or symptoms of abuse/neglect since last visito No Secondary Verification Process Completed: Yes Hospitalized since last visit: No Patient Requires Transmission-Based Precautions: No Implantable device outside of the clinic excluding No Patient Has Alerts: Yes cellular tissue based products placed in the center Patient Alerts: Patient on Blood Thinner since last visit: Has Dressing in Place as Prescribed: Yes Pain Present Now: No Electronic Signature(s) Signed: 10/16/2019 12:46:48 PM By: Levan Hurst RN, BSN Entered By: Levan Hurst on 10/15/2019 16:17:07 -------------------------------------------------------------------------------- Clinic Level of Care Assessment Details Patient Name: Date of Service: 55, [name] 10/15/2019 3:30 PM Medical Record Number: [id number] Patient Account Number: [id number] Date of Birth/Sex: Treating RN: January 07, 1932 (86 y.o. Daniel Chapman) Carlene Coria Primary Care Manpreet Strey: [name] Other Clinician: Referring Brecklyn Galvis: Treating Benicio Manna/Extender: Franki Cabot in Treatment: 1 Clinic Level of Care Assessment Items TOOL 4  Quantity Score X- 1 0 Use when only an EandM is performed on FOLLOW-UP visit ASSESSMENTS - Nursing Assessment / Reassessment X- 1 10 Reassessment of Co-morbidities (includes updates in patient status) X- 1 5 Reassessment of Adherence to Treatment Plan ASSESSMENTS - Wound and Skin A ssessment / Reassessment X - Simple Wound Assessment / Reassessment - one wound 1 5 '[]'  - 0 Complex Wound Assessment / Reassessment - multiple wounds '[]'  - 0 Dermatologic / Skin Assessment (not related to wound area) ASSESSMENTS - Focused Assessment '[]'  - 0 Circumferential Edema Measurements - multi extremities '[]'  - 0 Nutritional Assessment / Counseling / Intervention '[]'  - 0 Lower Extremity Assessment (monofilament, tuning fork, pulses) '[]'  - 0 Peripheral Arterial Disease Assessment (using hand held doppler) ASSESSMENTS - Ostomy and/or Continence Assessment and Care '[]'  - 0 Incontinence Assessment and Management '[]'  - 0 Ostomy Care Assessment and Management (repouching, etc.) PROCESS - Coordination of Care X - Simple Patient / Family Education for ongoing care 1 15 '[]'  - 0 Complex (extensive) Patient / Family Education for ongoing care '[]'  - 0 Staff obtains Programmer, systems, Records, T Results / Process Orders est '[]'  - 0 Staff telephones HHA, Nursing Homes / Clarify orders / etc '[]'  - 0 Routine Transfer to another Facility (non-emergent condition) '[]'  - 0 Routine Hospital Admission (non-emergent condition) '[]'  - 0 New Admissions / Biomedical engineer / Ordering NPWT Apligraf, etc. , '[]'  - 0 Emergency Hospital Admission (emergent condition) X- 1 10 Simple Discharge Coordination '[]'  - 0 Complex (extensive) Discharge Coordination PROCESS - Special Needs '[]'  - 0 Pediatric / Minor Patient Management '[]'  - 0 Isolation Patient Management '[]'  - 0 Hearing / Language / Visual special needs '[]'  - 0 Assessment of Community assistance (transportation, D/C planning, etc.) '[]'  - 0 Additional assistance / Altered  mentation '[]'  - 0 Support Surface(s) Assessment (bed, cushion, seat, etc.) INTERVENTIONS - Wound Cleansing / Measurement X - Simple Wound Cleansing - one  wound 1 5 '[]'  - 0 Complex Wound Cleansing - multiple wounds X- 1 5 Wound Imaging (photographs - any number of wounds) '[]'  - 0 Wound Tracing (instead of photographs) X- 1 5 Simple Wound Measurement - one wound '[]'  - 0 Complex Wound Measurement - multiple wounds INTERVENTIONS - Wound Dressings X - Small Wound Dressing one or multiple wounds 1 10 '[]'  - 0 Medium Wound Dressing one or multiple wounds '[]'  - 0 Large Wound Dressing one or multiple wounds X- 1 5 Application of Medications - topical '[]'  - 0 Application of Medications - injection INTERVENTIONS - Miscellaneous '[]'  - 0 External ear exam '[]'  - 0 Specimen Collection (cultures, biopsies, blood, body fluids, etc.) '[]'  - 0 Specimen(s) / Culture(s) sent or taken to Lab for analysis '[]'  - 0 Patient Transfer (multiple staff / Civil Service fast streamer / Similar devices) '[]'  - 0 Simple Staple / Suture removal (25 or less) '[]'  - 0 Complex Staple / Suture removal (26 or more) '[]'  - 0 Hypo / Hyperglycemic Management (close monitor of Blood Glucose) '[]'  - 0 Ankle / Brachial Index (ABI) - do not check if billed separately X- 1 5 Vital Signs Has the patient been seen at the hospital within the last three years: Yes Total Score: 80 Level Of Care: New/Established - Level 3 Electronic Signature(s) Signed: 10/18/2019 9:36:08 AM By: Carlene Coria RN Entered By: Carlene Coria on 10/15/2019 17:16:59 -------------------------------------------------------------------------------- Encounter Discharge Information Details Patient Name: Date of Service: [name], [name] 10/15/2019 3:30 PM Medical Record Number: [id number] Patient Account Number: [id number] Date of Birth/Sex: Treating RN: 1932-03-04 (86 y.o. Daniel Chapman Primary Care Zanyah Lentsch: [name] Other Clinician: Referring Leelynd Maldonado: Treating  Joselynne Killam/Extender: Franki Cabot in Treatment: 1 Encounter Discharge Information Items Discharge Condition: Stable Ambulatory Status: Ambulatory Discharge Destination: Home Transportation: Private Auto Accompanied By: wife Schedule Follow-up Appointment: Yes Clinical Summary of Care: Patient Declined Electronic Signature(s) Signed: 10/15/2019 5:47:21 PM By: Kela Millin Entered By: Kela Millin on 10/15/2019 17:45:07 -------------------------------------------------------------------------------- Lower Extremity Assessment Details Patient Name: Date of Service: [name], [name] 10/15/2019 3:30 PM Medical Record Number: [id number] Patient Account Number: [id number] Date of Birth/Sex: Treating RN: [birth year] (86 y.o. Daniel Chapman Primary Care Rana Adorno: [name] Other Clinician: Referring Rutger Salton: Treating Rambo Sarafian/Extender: Franki Cabot in Treatment: 1 Edema Assessment Assessed: [Left: No] [Right: No] Edema: [Left: N] [Right: o] Calf Left: Right: Point of Measurement: 37 cm From Medial Instep cm 27 cm Ankle Left: Right: Point of Measurement: 10 cm From Medial Instep cm 18 cm Vascular Assessment Pulses: Dorsalis Pedis Palpable: [Right:Yes] Electronic Signature(s) Signed: 10/16/2019 12:46:48 PM By: Levan Hurst RN, BSN Entered By: Levan Hurst on 10/15/2019 16:22:54 -------------------------------------------------------------------------------- Multi Wound Chart Details Patient Name: Date of Service: [name], [name] 10/15/2019 3:30 PM Medical Record Number: [id number] Patient Account Number: [id number] Date of Birth/Sex: Treating RN: 06-14-1931 (86 y.o. Daniel Chapman) Carlene Coria Primary Care Janiel Derhammer: [name] Other Clinician: Referring Jerlene Rockers: Treating Brailyn Delman/Extender: Benson Setting Weeks in Treatment: 1 Vital Signs Height(in): 68 Pulse(bpm): 65 Weight(lbs): 123 Blood  Pressure(mmHg): 123/70 Body Mass Index(BMI): 19 Temperature(F): 97.5 Respiratory Rate(breaths/min): 18 Photos: [1:No Photos Right, Dorsal Foot] [N/A:N/A N/A] Wound Location: [1:Trauma] [N/A:N/A] Wounding Event: [1:Arterial Insufficiency Ulcer] [N/A:N/A] Primary Etiology: [1:Dementia, Received Radiation] [N/A:N/A] Comorbid History: [1:09/08/2019] [N/A:N/A] Date Acquired: [1:1] [N/A:N/A] Weeks of Treatment: [1:Open] [N/A:N/A] Wound Status: [1:1.2x1.1x0.1] [N/A:N/A] Measurements L x W x D (cm) [1:1.037] [N/A:N/A] A (cm) :  rea [1:0.104] [N/A:N/A] Volume (cm) : [1:58.10%] [N/A:N/A] % Reduction in A rea: [1:57.90%] [N/A:N/A] % Reduction in Volume: [1:Full Thickness Without Exposed] [N/A:N/A] Classification: [1:Support Structures Medium] [N/A:N/A] Exudate Amount: [1:Serosanguineous] [N/A:N/A] Exudate Type: [1:red, brown] [N/A:N/A] Exudate Color: [1:Flat and Intact] [N/A:N/A] Wound Margin: [1:Large (67-100%)] [N/A:N/A] Granulation Amount: [1:Red, Pink] [N/A:N/A] Granulation Quality: [1:Small (1-33%)] [N/A:N/A] Necrotic Amount: [1:Fat Layer (Subcutaneous Tissue)] [N/A:N/A] Exposed Structures: [1:Exposed: Yes Fascia: No Tendon: No Muscle: No Joint: No Bone: No Small (1-33%)] [N/A:N/A] Treatment Notes Wound #1 (Right, Dorsal Foot) 1. Cleanse With Wound Cleanser 2. Periwound Care Skin Prep 3. Primary Dressing Applied Collegen AG 4. Secondary Dressing Foam Border Dressing Electronic Signature(s) Signed: 10/17/2019 12:52:55 PM By: Linton Ham MD Signed: 10/18/2019 9:36:08 AM By: Carlene Coria RN Entered By: Linton Ham on 10/15/2019 17:58:14 -------------------------------------------------------------------------------- Multi-Disciplinary Care Plan Details Patient Name: Date of Service: [name], [name] 10/15/2019 3:30 PM Medical Record Number: [id number] Patient Account Number: [id number] Date of Birth/Sex: Treating RN: [birth year] (86 y.o. Daniel Chapman) Carlene Coria Primary Care  Nainika Newlun: [name] Other Clinician: Referring Rashi Giuliani: Treating Davionne Dowty/Extender: Franki Cabot in Treatment: 1 Active Inactive Orientation to the Wound Care Program Nursing Diagnoses: Knowledge deficit related to the wound healing center program Goals: Patient/caregiver will verbalize understanding of the [place] Program Date Initiated: 10/03/2019 Target Resolution Date: 11/01/2019 Goal Status: Active Interventions: Provide education on orientation to the wound center Notes: Pain, Acute or Chronic Nursing Diagnoses: Pain, acute or chronic: actual or potential Potential alteration in comfort, pain Goals: Patient will verbalize adequate pain control and receive pain control interventions during procedures as needed Date Initiated: 10/03/2019 Target Resolution Date: 10/31/2019 Goal Status: Active Patient/caregiver will verbalize comfort level met Date Initiated: 10/03/2019 Target Resolution Date: 11/01/2019 Goal Status: Active Interventions: Encourage patient to take pain medications as prescribed Provide education on pain management Reposition patient for comfort Treatment Activities: Administer pain control measures as ordered : 10/03/2019 Notes: Wound/Skin Impairment Nursing Diagnoses: Knowledge deficit related to ulceration/compromised skin integrity Goals: Patient/caregiver will verbalize understanding of skin care regimen Date Initiated: 10/03/2019 Target Resolution Date: 11/01/2019 Goal Status: Active Interventions: Assess patient/caregiver ability to obtain necessary supplies Assess patient/caregiver ability to perform ulcer/skin care regimen upon admission and as needed Assess ulceration(s) every visit Provide education on ulcer and skin care Treatment Activities: Skin care regimen initiated : 10/03/2019 Topical wound management initiated : 10/03/2019 Notes: Electronic Signature(s) Signed: 10/15/2019 5:13:38 PM By: Carlene Coria  RN Entered By: Carlene Coria on 10/15/2019 15:43:55 -------------------------------------------------------------------------------- Pain Assessment Details Patient Name: Date of Service: [name], [name] 10/15/2019 3:30 PM Medical Record Number: [id number] Patient Account Number: [id number] Date of Birth/Sex: Treating RN: 06/18/1931 (86 y.o. Daniel Chapman Primary Care Aldridge Krzyzanowski: [name] Other Clinician: Referring Grettell Ransdell: Treating Foy Vanduyne/Extender: Franki Cabot in Treatment: 1 Active Problems Location of Pain Severity and Description of Pain Patient Has Paino No Site Locations Pain Management and Medication Current Pain Management: Electronic Signature(s) Signed: 10/16/2019 12:46:48 PM By: Levan Hurst RN, BSN Entered By: Levan Hurst on 10/15/2019 16:19:35 -------------------------------------------------------------------------------- Patient/Caregiver Education Details Patient Name: Date of Service: [name], [name] 5/18/2021andnbsp3:30 PM Medical Record Number: [id number] Patient Account Number: [id number] Date of Birth/Gender: Treating RN: [birth year] (86 y.o. Daniel Chapman Primary Care Physician: [facility], [place] Other Clinician: Referring Physician: Treating Physician/Extender: Franki Cabot in Treatment: 1 Education Assessment Education Provided To: Patient Education Topics Provided Wound/Skin Impairment: Methods: Explain/Verbal Responses: State content  correctly Electronic Signature(s) Signed: 10/15/2019 5:13:38 PM By: Carlene Coria RN Entered By: Carlene Coria on 10/15/2019 15:44:10 -------------------------------------------------------------------------------- Wound Assessment Details Patient Name: Date of Service: [name], [name] 10/15/2019 3:30 PM Medical Record Number: [id number] Patient Account Number: [id number] Date of Birth/Sex: Treating RN: August 06, 1931 (86 y.o. Daniel Chapman Primary Care Malik Paar: [name] Other Clinician: Referring Geoffrey Mankin: Treating Wallace Gappa/Extender: Franki Cabot in Treatment: 1 Wound Status Wound Number: 1 Primary Etiology: Arterial Insufficiency Ulcer Wound Location: Right, Dorsal Foot Wound Status: Open Wounding Event: Trauma Comorbid History: Dementia, Received Radiation Date Acquired: 09/08/2019 Weeks Of Treatment: 1 Clustered Wound: No Wound Measurements Length: (cm) 1.2 Width: (cm) 1.1 Depth: (cm) 0.1 Area: (cm) 1.037 Volume: (cm) 0.104 % Reduction in Area: 58.1% % Reduction in Volume: 57.9% Epithelialization: Small (1-33%) Tunneling: No Undermining: No Wound Description Classification: Full Thickness Without Exposed Support Structures Wound Margin: Flat and Intact Exudate Amount: Medium Exudate Type: Serosanguineous Exudate Color: red, brown Foul Odor After Cleansing: No Slough/Fibrino Yes Wound Bed Granulation Amount: Large (67-100%) Exposed Structure Granulation Quality: Red, Pink Fascia Exposed: No Necrotic Amount: Small (1-33%) Fat Layer (Subcutaneous Tissue) Exposed: Yes Necrotic Quality: Adherent Slough Tendon Exposed: No Muscle Exposed: No Joint Exposed: No Bone Exposed: No Treatment Notes Wound #1 (Right, Dorsal Foot) 1. Cleanse With Wound Cleanser 2. Periwound Care Skin Prep 3. Primary Dressing Applied Collegen AG 4. Secondary Dressing Foam Border Dressing Electronic Signature(s) Signed: 10/16/2019 12:46:48 PM By: Levan Hurst RN, BSN Entered By: Levan Hurst on 10/15/2019 16:24:21 -------------------------------------------------------------------------------- Vitals Details Patient Name: Date of Service: [name], [name] 10/15/2019 3:30 PM Medical Record Number: [id number] Patient Account Number: [id number] Date of Birth/Sex: Treating RN: 1932/04/23 (86 y.o. Daniel Chapman Primary Care Mohsen Odenthal: [name] Other  Clinician: Referring Morganne Haile: Treating Ardit Danh/Extender: Franki Cabot in Treatment: 1 Vital Signs Time Taken: 16:19 Temperature (F): 97.5 Height (in): 68 Pulse (bpm): 65 Weight (lbs): 123 Respiratory Rate (breaths/min): 18 Body Mass Index (BMI): 18.7 Blood Pressure (mmHg): 123/70 Reference Range: 80 - 120 mg / dl Electronic Signature(s) Signed: 10/16/2019 12:46:48 PM By: Levan Hurst RN, BSN Entered By: Levan Hurst on 10/15/2019 16:19:30

## 2019-10-21 NOTE — Progress Notes (Signed)
UZIAH, SORTER ([id number]) Visit Report for 10/03/2019 Allergy List Details Patient Name: Date of Service: [place], [name] 10/03/2019 1:15 PM Medical Record Number: [id number] Patient Account Number: [id number] Date of Birth/Sex: Treating RN: 03-23-1932 (86 y.o. [name]) Carlene Coria Primary Care Braylynn Lewing: [name] Other Clinician: Referring Danelia Snodgrass: Treating Derreon Consalvo/Extender: Lisbeth Renshaw A Weeks in Treatment: 0 Allergies Active Allergies No Known Allergies Allergy Notes Electronic Signature(s) Signed: 10/03/2019 5:04:33 PM By: Carlene Coria RN Entered By: Carlene Coria on 10/03/2019 13:46:29 -------------------------------------------------------------------------------- [place] Details Patient Name: Date of Service: [name], [name] 10/03/2019 1:15 PM Medical Record Number: [id number] Patient Account Number: [id number] Date of Birth/Sex: Treating RN: [birth year] (86 y.o. Oval Linsey Primary Care Kasarah Sitts: [name] Other Clinician: Referring Noralee Dutko: Treating Anjoli Diemer/Extender: Franki Cabot in Treatment: 0 Visit Information Patient Arrived: Ambulatory Arrival Time: 13:42 Accompanied By: wife Transfer Assistance: None Patient Identification Verified: Yes Secondary Verification Process Completed: Yes Patient Requires Transmission-Based Precautions: No Patient Has Alerts: Yes Patient Alerts: Patient on Blood Thinner Electronic Signature(s) Signed: 10/03/2019 5:04:33 PM By: Carlene Coria RN Entered By: Carlene Coria on 10/03/2019 13:45:14 -------------------------------------------------------------------------------- Clinic Level of Care Assessment Details Patient Name: Date of Service: [name] 10/03/2019 1:15 PM Medical Record Number: [id number] Patient Account Number: [id number] Date of Birth/Sex: Treating RN: 1931-08-26 (86 y.o. [name] Primary Care Abiha Lukehart: [name] Other  Clinician: Referring Kaylany Tesoriero: Treating Shakiyla Kook/Extender: Franki Cabot in Treatment: 0 Clinic Level of Care Assessment Items TOOL 2 Quantity Score X- 1 0 Use when only an EandM is performed on the INITIAL visit ASSESSMENTS - Nursing Assessment / Reassessment X- 1 20 General Physical Exam (combine w/ comprehensive assessment (listed just below) when performed on new pt. evals) X- 1 25 Comprehensive Assessment (HX, ROS, Risk Assessments, Wounds Hx, etc.) ASSESSMENTS - Wound and Skin A ssessment / Reassessment X - Simple Wound Assessment / Reassessment - one wound 1 5 '[]'  - 0 Complex Wound Assessment / Reassessment - multiple wounds X- 1 10 Dermatologic / Skin Assessment (not related to wound area) ASSESSMENTS - Ostomy and/or Continence Assessment and Care '[]'  - 0 Incontinence Assessment and Management '[]'  - 0 Ostomy Care Assessment and Management (repouching, etc.) PROCESS - Coordination of Care X - Simple Patient / Family Education for ongoing care 1 15 '[]'  - 0 Complex (extensive) Patient / Family Education for ongoing care X- 1 10 Staff obtains Programmer, systems, Records, T Results / Process Orders est '[]'  - 0 Staff telephones HHA, Nursing Homes / Clarify orders / etc '[]'  - 0 Routine Transfer to another Facility (non-emergent condition) '[]'  - 0 Routine Hospital Admission (non-emergent condition) X- 1 15 New Admissions / Biomedical engineer / Ordering NPWT Apligraf, etc. , '[]'  - 0 Emergency Hospital Admission (emergent condition) X- 1 10 Simple Discharge Coordination '[]'  - 0 Complex (extensive) Discharge Coordination PROCESS - Special Needs '[]'  - 0 Pediatric / Minor Patient Management '[]'  - 0 Isolation Patient Management '[]'  - 0 Hearing / Language / Visual special needs '[]'  - 0 Assessment of Community assistance (transportation, D/C planning, etc.) '[]'  - 0 Additional assistance / Altered mentation '[]'  - 0 Support Surface(s) Assessment (bed, cushion,  seat, etc.) INTERVENTIONS - Wound Cleansing / Measurement X- 1 5 Wound Imaging (photographs - any number of wounds) '[]'  - 0 Wound Tracing (instead of photographs) X- 1 5 Simple Wound Measurement - one wound '[]'  - 0 Complex Wound Measurement - multiple  wounds X- 1 5 Simple Wound Cleansing - one wound '[]'  - 0 Complex Wound Cleansing - multiple wounds INTERVENTIONS - Wound Dressings X - Small Wound Dressing one or multiple wounds 1 10 '[]'  - 0 Medium Wound Dressing one or multiple wounds '[]'  - 0 Large Wound Dressing one or multiple wounds '[]'  - 0 Application of Medications - injection INTERVENTIONS - Miscellaneous '[]'  - 0 External ear exam '[]'  - 0 Specimen Collection (cultures, biopsies, blood, body fluids, etc.) '[]'  - 0 Specimen(s) / Culture(s) sent or taken to Lab for analysis '[]'  - 0 Patient Transfer (multiple staff / Harrel Lemon Lift / Similar devices) '[]'  - 0 Simple Staple / Suture removal (25 or less) '[]'  - 0 Complex Staple / Suture removal (26 or more) '[]'  - 0 Hypo / Hyperglycemic Management (close monitor of Blood Glucose) X- 1 15 Ankle / Brachial Index (ABI) - do not check if billed separately Has the patient been seen at the hospital within the last three years: Yes Total Score: 150 Level Of Care: New/Established - Level 4 Electronic Signature(s) Signed: 10/03/2019 5:34:26 PM By: Deon Pilling Entered By: Deon Pilling on 10/03/2019 14:47:47 -------------------------------------------------------------------------------- Lower Extremity Assessment Details Patient Name: Date of Service: [name], [name] 10/03/2019 1:15 PM Medical Record Number: [id number] Patient Account Number: [id number] Date of Birth/Sex: Treating RN: Feb 03, 1932 (86 y.o. [name]) Carlene Coria Primary Care Ellana Kawa: [name] Other Clinician: Referring Bradee Common: Treating Idan Prime/Extender: Benson Setting Weeks in Treatment: 0 Edema Assessment Assessed: [Left: No] [Right: No] [Left: Edema]  [Right: :] Calf Left: Right: Point of Measurement: 37 cm From Medial Instep cm 27 cm Ankle Left: Right: Point of Measurement: 10 cm From Medial Instep cm 18 cm Vascular Assessment Blood Pressure: Brachial: [Right:148] Ankle: [Right:Dorsalis Pedis: 60 0.41] Electronic Signature(s) Signed: 10/03/2019 5:04:33 PM By: Carlene Coria RN Entered By: Carlene Coria on 10/03/2019 14:15:38 -------------------------------------------------------------------------------- Multi Wound Chart Details Patient Name: Date of Service: [name], [name] 10/03/2019 1:15 PM Medical Record Number: [id number] Patient Account Number: [id number] Date of Birth/Sex: Treating RN: 09-05-1931 (86 y.o. [name] Primary Care Ved Martos: [name] Other Clinician: Referring Seanpaul Preece: Treating Jaylenne Hamelin/Extender: Franki Cabot in Treatment: 0 Vital Signs Height(in): 68 Pulse(bpm): 65 Weight(lbs): 123 Blood Pressure(mmHg): 148/84 Body Mass Index(BMI): 19 Temperature(F): 98.4 Respiratory Rate(breaths/min): 18 Photos: [1:No Photos Right, Dorsal Foot] [N/A:N/A N/A] Wound Location: [1:Trauma] [N/A:N/A] Wounding Event: [1:Arterial Insufficiency Ulcer] [N/A:N/A] Primary Etiology: [1:Dementia, Received Radiation] [N/A:N/A] Comorbid History: [1:09/08/2019] [N/A:N/A] Date Acquired: [1:0] [N/A:N/A] Weeks of Treatment: [1:Open] [N/A:N/A] Wound Status: [1:2.1x1.5x0.1] [N/A:N/A] Measurements L x W x D (cm) [1:2.474] [N/A:N/A] A (cm) : rea [1:0.247] [N/A:N/A] Volume (cm) : [1:Full Thickness Without Exposed] [N/A:N/A] Classification: [1:Support Structures Medium] [N/A:N/A] Exudate Amount: [1:Serosanguineous] [N/A:N/A] Exudate Type: [1:red, brown] [N/A:N/A] Exudate Color: [1:Medium (34-66%)] [N/A:N/A] Granulation Amount: [1:Medium (34-66%)] [N/A:N/A] Necrotic Amount: [1:Fat Layer (Subcutaneous Tissue)] [N/A:N/A] Exposed Structures: [1:Exposed: Yes Fascia: No Tendon: No Muscle: No  Joint: No Bone: No None] [N/A:N/A] Treatment Notes Electronic Signature(s) Signed: 10/03/2019 5:30:42 PM By: Linton Ham MD Signed: 10/21/2019 1:34:25 PM By: Deon Pilling Entered By: Linton Ham on 10/03/2019 14:54:55 -------------------------------------------------------------------------------- Multi-Disciplinary Care Plan Details Patient Name: Date of Service: [name], [name] 10/03/2019 1:15 PM Medical Record Number: [id number] Patient Account Number: [id number] Date of Birth/Sex: Treating RN: 17-Oct-1931 (86 y.o. [name] Primary Care Jonatan Wilsey: [name] Other Clinician: Referring Debbie Bellucci: Treating Arielis Leonhart/Extender: Franki Cabot in Treatment: 0 Active Inactive Orientation  to the Wound Care Program Nursing Diagnoses: Knowledge deficit related to the wound healing center program Goals: Patient/caregiver will verbalize understanding of the [place] Program Date Initiated: 10/03/2019 Target Resolution Date: 11/01/2019 Goal Status: Active Interventions: Provide education on orientation to the wound center Notes: Pain, Acute or Chronic Nursing Diagnoses: Pain, acute or chronic: actual or potential Potential alteration in comfort, pain Goals: Patient will verbalize adequate pain control and receive pain control interventions during procedures as needed Date Initiated: 10/03/2019 Target Resolution Date: 10/31/2019 Goal Status: Active Patient/caregiver will verbalize comfort level met Date Initiated: 10/03/2019 Target Resolution Date: 11/01/2019 Goal Status: Active Interventions: Encourage patient to take pain medications as prescribed Provide education on pain management Reposition patient for comfort Treatment Activities: Administer pain control measures as ordered : 10/03/2019 Notes: Wound/Skin Impairment Nursing Diagnoses: Knowledge deficit related to ulceration/compromised skin integrity Goals: Patient/caregiver will  verbalize understanding of skin care regimen Date Initiated: 10/03/2019 Target Resolution Date: 11/01/2019 Goal Status: Active Interventions: Assess patient/caregiver ability to obtain necessary supplies Assess patient/caregiver ability to perform ulcer/skin care regimen upon admission and as needed Assess ulceration(s) every visit Provide education on ulcer and skin care Treatment Activities: Skin care regimen initiated : 10/03/2019 Topical wound management initiated : 10/03/2019 Notes: Electronic Signature(s) Signed: 10/03/2019 5:34:26 PM By: Deon Pilling Signed: 10/21/2019 1:34:25 PM By: Deon Pilling Entered By: Deon Pilling on 10/03/2019 14:35:38 -------------------------------------------------------------------------------- Pain Assessment Details Patient Name: Date of Service: [name], [name] 10/03/2019 1:15 PM Medical Record Number: [id number] Patient Account Number: [id number] Date of Birth/Sex: Treating RN: 1931-09-06 (86 y.o. Oval Linsey Primary Care Proctor Carriker: [name] Other Clinician: Referring Princetta Uplinger: Treating Sherman Lipuma/Extender: Benson Setting Weeks in Treatment: 0 Active Problems Location of Pain Severity and Description of Pain Patient Has Paino No Site Locations Pain Management and Medication Current Pain Management: Electronic Signature(s) Signed: 10/03/2019 5:04:33 PM By: Carlene Coria RN Entered By: Carlene Coria on 10/03/2019 14:19:15 -------------------------------------------------------------------------------- Patient/Caregiver Education Details Patient Name: Date of Service: [name], [name] 5/6/2021andnbsp1:15 PM Medical Record Number: [id number] Patient Account Number: [id number] Date of Birth/Gender: Treating RN: Jan 27, 1932 (86 y.o. [name] Primary Care Physician: [place], [place] Other Clinician: Referring Physician: Treating Physician/Extender: Franki Cabot in Treatment: 0 Education  Assessment Education Provided To: Patient Education Topics Provided Welcome T [facility]: o Handouts: [place] o Methods: Explain/Verbal, Printed Responses: Reinforcements needed Wound/Skin Impairment: Handouts: Caring for Your Ulcer, Skin Care Do's and Dont's Methods: Explain/Verbal, Printed Responses: Reinforcements needed Electronic Signature(s) Signed: 10/03/2019 5:34:26 PM By: Deon Pilling Entered By: Deon Pilling on 10/03/2019 14:36:22 -------------------------------------------------------------------------------- Wound Assessment Details Patient Name: Date of Service: [name], [name] 10/03/2019 1:15 PM Medical Record Number: [id number] Patient Account Number: [id number] Date of Birth/Sex: Treating RN: Oct 18, 1931 (86 y.o. [name]) Carlene Coria Primary Care Tinisha Etzkorn: [name] Other Clinician: Referring Bev Drennen: Treating Kenniel Bergsma/Extender: Lisbeth Renshaw A Weeks in Treatment: 0 Wound Status Wound Number: 1 Primary Etiology: Arterial Insufficiency Ulcer Wound Location: Right, Dorsal Foot Wound Status: Open Wounding Event: Trauma Comorbid History: Dementia, Received Radiation Date Acquired: 09/08/2019 Weeks Of Treatment: 0 Clustered Wound: No Photos Photo Uploaded By: Mikeal Hawthorne on 10/04/2019 08:18:15 Wound Measurements Length: (cm) 2.1 Width: (cm) 1.5 Depth: (cm) 0.1 Area: (cm) 2.474 Volume: (cm) 0.247 % Reduction in Area: % Reduction in Volume: Epithelialization: None Tunneling: No Undermining: No Wound Description Classification: Full Thickness Without Exposed Support Structures Exudate Amount: Medium  Exudate Type: Serosanguineous Exudate Color: red, brown Foul Odor After Cleansing: No Slough/Fibrino Yes Wound Bed Granulation Amount: Medium (34-66%) Exposed Structure Necrotic Amount: Medium (34-66%) Fascia Exposed: No Necrotic Quality: Adherent Slough Fat Layer (Subcutaneous Tissue) Exposed:  Yes Tendon Exposed: No Muscle Exposed: No Joint Exposed: No Bone Exposed: No Electronic Signature(s) Signed: 10/03/2019 5:04:33 PM By: Carlene Coria RN Entered By: Carlene Coria on 10/03/2019 14:18:41 -------------------------------------------------------------------------------- Vitals Details Patient Name: Date of Service: [name], [name] 10/03/2019 1:15 PM Medical Record Number: [id number] Patient Account Number: [id number] Date of Birth/Sex: Treating RN: 1931-11-06 (86 y.o. [name]) Carlene Coria Primary Care Lilianna Case: [name] Other Clinician: Referring Nilah Belcourt: Treating Adorian Gwynne/Extender: Benson Setting Weeks in Treatment: 0 Vital Signs Time Taken: 13:45 Temperature (F): 98.4 Height (in): 68 Pulse (bpm): 65 Source: Stated Respiratory Rate (breaths/min): 18 Weight (lbs): 123 Blood Pressure (mmHg): 148/84 Source: Stated Reference Range: 80 - 120 mg / dl Body Mass Index (BMI): 18.7 Electronic Signature(s) Signed: 10/03/2019 5:04:33 PM By: Carlene Coria RN Entered By: Carlene Coria on 10/03/2019 13:46:12

## 2019-10-31 ENCOUNTER — Encounter (HOSPITAL_BASED_OUTPATIENT_CLINIC_OR_DEPARTMENT_OTHER): Payer: Medicare Other | Attending: Internal Medicine | Admitting: Internal Medicine

## 2019-10-31 DIAGNOSIS — S91311D Laceration without foreign body, right foot, subsequent encounter: Secondary | ICD-10-CM | POA: Diagnosis not present

## 2019-10-31 DIAGNOSIS — Z85819 Personal history of malignant neoplasm of unspecified site of lip, oral cavity, and pharynx: Secondary | ICD-10-CM | POA: Diagnosis not present

## 2019-10-31 DIAGNOSIS — W2209XD Striking against other stationary object, subsequent encounter: Secondary | ICD-10-CM | POA: Insufficient documentation

## 2019-10-31 DIAGNOSIS — Z923 Personal history of irradiation: Secondary | ICD-10-CM | POA: Insufficient documentation

## 2019-10-31 DIAGNOSIS — S91311A Laceration without foreign body, right foot, initial encounter: Secondary | ICD-10-CM | POA: Diagnosis not present

## 2019-10-31 DIAGNOSIS — I4891 Unspecified atrial fibrillation: Secondary | ICD-10-CM | POA: Insufficient documentation

## 2019-10-31 DIAGNOSIS — Z7901 Long term (current) use of anticoagulants: Secondary | ICD-10-CM | POA: Diagnosis not present

## 2019-10-31 DIAGNOSIS — I70235 Atherosclerosis of native arteries of right leg with ulceration of other part of foot: Secondary | ICD-10-CM | POA: Insufficient documentation

## 2019-10-31 DIAGNOSIS — F17200 Nicotine dependence, unspecified, uncomplicated: Secondary | ICD-10-CM | POA: Diagnosis not present

## 2019-10-31 DIAGNOSIS — L03115 Cellulitis of right lower limb: Secondary | ICD-10-CM | POA: Insufficient documentation

## 2019-10-31 DIAGNOSIS — F0150 Vascular dementia without behavioral disturbance: Secondary | ICD-10-CM | POA: Diagnosis not present

## 2019-11-01 NOTE — Progress Notes (Signed)
KUSH, FARABEE ([id number]) Visit Report for 10/31/2019 HPI Details Patient Name: Date of Service: Daniel Chapman, Daniel D. 10/31/2019 1:30 PM Medical Record Number: [id number] Patient Account Number: [id number] Date of Birth/Sex: Treating RN: [birth year] (86 y.o. [name] Primary Care Provider: [name] Other Clinician: Referring Provider: Treating Provider/Extender: Franki Cabot in Treatment: 4 History of Present Illness HPI Description: ADMISSION 10/03/2019 This is a 86 year old man who traumatized his right foot when he got up in the middle of the night on a night light. This was in the middle of April. Came to attention of his primary physician on 4/26. The area was red and open. He was given a course of cephalexin with some improvement. He was seen yesterday in follow-up again by primary care he was given another round of Keflex although noted that he was considerably better. They have been using Neosporin and a Band-Aid on the wound area. The patient has multi-infarct dementia and is really not able to respond to questions all that much. He does not appear to be in a lot of discomfort. Past medical history chronic A. fib on Eliquis, vascular dementia, hyperlipidemia, continued tobacco abuse, COPD, coronary artery disease and a history of oropharyngeal cancer for which she received radiation in 2012 ABI in our clinic was 0.41 5/18; wound on the right dorsal foot from trauma. He likely has severe PAD. He gets cramps at night according to his wife. We are using silver collagen. He is somewhat disinhibited and loud. It is difficult to see him getting through noninvasive arterial studies and certainly not an angiogram without significant anesthesia 6/3; patient's wound on the right dorsal foot which was initially traumatic continues to get smaller. He does not appear to have any pain however he likely has severe PAD. In spite of an ABI in our clinic of 0.41 the  wound is actually contracted. Again I had a long conversation about going forward with formal arterial studies in the right leg with somebody with severe dementia likely vascular per my notes. The issue would be whether or not to proceed with an angiogram or given his dementia would we be even able to do an angiogram. I spent some time talking to the patient's wife about this today. I do not think we are under any pressure to make a decision here because the wound is healing and as far as I can tell the patient is relatively asymptomatic Electronic Signature(s) Signed: 11/01/2019 8:05:25 AM By: Linton Ham MD Entered By: Linton Ham on 10/31/2019 14:40:24 -------------------------------------------------------------------------------- Physical Exam Details Patient Name: Date of Service: Daniel Chapman, Daniel BERT D. 10/31/2019 1:30 PM Medical Record Number: [id number] Patient Account Number: [id number] Date of Birth/Sex: Treating RN: 1931/11/17 (86 y.o. [name] Primary Care Provider: [name] Other Clinician: Referring Provider: Treating Provider/Extender: Franki Cabot in Treatment: 4 Constitutional Sitting or standing Blood Pressure is within target range for patient.. Pulse regular and within target range for patient.Marland Kitchen Respirations regular, non-labored and within target range.. Temperature is normal and within the target range for the patient.Marland Kitchen Appears in no distress. Cardiovascular Pedal pulses are absent on the right. Popliteal pulses absent. Integumentary (Hair, Skin) There is no infection around the wound. Notes Wound exam; the patient has a smaller wound on the right dorsal foot clean surface no debridement is necessary. Electronic Signature(s) Signed: 11/01/2019 8:05:25 AM By: Linton Ham MD Entered By: Linton Ham on 10/31/2019 14:41:08 -------------------------------------------------------------------------------- Physician Orders  Details Patient Name: Date of Service: Daniel [place], Daniel D. 10/31/2019 1:30 PM Medical Record Number: [id number] Patient Account Number: [id number] Date of Birth/Sex: Treating RN: 11/12/1931 (86 y.o. [name] Primary Care Provider: [name] Other Clinician: Referring Provider: Treating Provider/Extender: Franki Cabot in Treatment: 4 Verbal / Phone Orders: No Diagnosis Coding ICD-10 Coding Code Description D35.701X Laceration without foreign body, right foot, subsequent encounter I70.235 Atherosclerosis of native arteries of right leg with ulceration of other part of foot L03.115 Cellulitis of right lower limb Follow-up Appointments Return Appointment in 2 weeks. Dressing Change Frequency Change Dressing every other day. Skin Barriers/Peri-Wound Care Skin Prep Wound Cleansing May shower and wash wound with soap and water. - with dressing changes only. Primary Wound Dressing Silver Collagen - moisten with hydrogel. Secondary Dressing Foam Border - bordered gauze. Edema Control Avoid standing for long periods of time Elevate legs to the level of the heart or above for 30 minutes daily and/or when sitting, a frequency of: - throughout the day. Exercise regularly Off-Loading Other: - try to ensure no pressure is on the wound on the foot. Electronic Signature(s) Signed: 10/31/2019 6:01:01 PM By: Deon Pilling Signed: 11/01/2019 8:05:25 AM By: Linton Ham MD Entered By: Deon Pilling on 10/31/2019 14:16:21 -------------------------------------------------------------------------------- Problem List Details Patient Name: Date of Service: Daniel Chapman, Daniel BERT D. 10/31/2019 1:30 PM Medical Record Number: [id number] Patient Account Number: [id number] Date of Birth/Sex: Treating RN: 13-Aug-1931 (86 y.o. [name] Primary Care Provider: [name] Other Clinician: Referring Provider: Treating Provider/Extender: Franki Cabot in Treatment: 4 Active Problems ICD-10 Encounter Code Description Active Date MDM Diagnosis S91.311D Laceration without foreign body, right foot, subsequent encounter 10/03/2019 No Yes I70.235 Atherosclerosis of native arteries of right leg with ulceration of other part of 10/03/2019 No Yes foot L03.115 Cellulitis of right lower limb 10/03/2019 No Yes Inactive Problems Resolved Problems Electronic Signature(s) Signed: 11/01/2019 8:05:25 AM By: Linton Ham MD Entered By: Linton Ham on 10/31/2019 14:37:57 -------------------------------------------------------------------------------- Progress Note Details Patient Name: Date of Service: Daniel Chapman, Daniel BERT D. 10/31/2019 1:30 PM Medical Record Number: [id number] Patient Account Number: [id number] Date of Birth/Sex: Treating RN: [birth year] (86 y.o. [name] Primary Care Provider: [name] Other Clinician: Referring Provider: Treating Provider/Extender: Franki Cabot in Treatment: 4 Subjective History of Present Illness (HPI) ADMISSION 10/03/2019 This is a 86 year old man who traumatized his right foot when he got up in the middle of the night on a night light. This was in the middle of April. Came to attention of his primary physician on 4/26. The area was red and open. He was given a course of cephalexin with some improvement. He was seen yesterday in follow-up again by primary care he was given another round of Keflex although noted that he was considerably better. They have been using Neosporin and a Band-Aid on the wound area. The patient has multi-infarct dementia and is really not able to respond to questions all that much. He does not appear to be in a lot of discomfort. Past medical history chronic A. fib on Eliquis, vascular dementia, hyperlipidemia, continued tobacco abuse, COPD, coronary artery disease and a history of oropharyngeal cancer for which she received radiation in 2012 ABI  in our clinic was 0.41 5/18; wound on the right dorsal foot from trauma. He likely has severe PAD. He gets cramps at night according to his wife. We are using silver collagen.  He is somewhat disinhibited and loud. It is difficult to see him getting through noninvasive arterial studies and certainly not an angiogram without significant anesthesia 6/3; patient's wound on the right dorsal foot which was initially traumatic continues to get smaller. He does not appear to have any pain however he likely has severe PAD. In spite of an ABI in our clinic of 0.41 the wound is actually contracted. Again I had a long conversation about going forward with formal arterial studies in the right leg with somebody with severe dementia likely vascular per my notes. The issue would be whether or not to proceed with an angiogram or given his dementia would we be even able to do an angiogram. I spent some time talking to the patient's wife about this today. I do not think we are under any pressure to make a decision here because the wound is healing and as far as I can tell the patient is relatively asymptomatic Objective Constitutional Sitting or standing Blood Pressure is within target range for patient.. Pulse regular and within target range for patient.Marland Kitchen Respirations regular, non-labored and within target range.. Temperature is normal and within the target range for the patient.Marland Kitchen Appears in no distress. Vitals Time Taken: 1:59 PM, Height: 68 in, Weight: 123 lbs, BMI: 18.7, Temperature: 98.1 F, Pulse: 77 bpm, Respiratory Rate: 18 breaths/min, Blood Pressure: 122/69 mmHg. Cardiovascular Pedal pulses are absent on the right. Popliteal pulses absent. General Notes: Wound exam; the patient has a smaller wound on the right dorsal foot clean surface no debridement is necessary. Integumentary (Hair, Skin) There is no infection around the wound. Wound #1 status is Open. Original cause of wound was Trauma. The wound is  located on the Right,Dorsal Foot. The wound measures 0.3cm length x 0.2cm width x 0.1cm depth; 0.047cm^2 area and 0.005cm^3 volume. There is Fat Layer (Subcutaneous Tissue) Exposed exposed. There is no tunneling or undermining noted. There is a medium amount of serosanguineous drainage noted. The wound margin is flat and intact. There is large (67-100%) red, pink granulation within the wound bed. There is a small (1-33%) amount of necrotic tissue within the wound bed including Adherent Slough. Assessment Active Problems ICD-10 Laceration without foreign body, right foot, subsequent encounter Atherosclerosis of native arteries of right leg with ulceration of other part of foot Cellulitis of right lower limb Plan Follow-up Appointments: Return Appointment in 2 weeks. Dressing Change Frequency: Change Dressing every other day. Skin Barriers/Peri-Wound Care: Skin Prep Wound Cleansing: May shower and wash wound with soap and water. - with dressing changes only. Primary Wound Dressing: Silver Collagen - moisten with hydrogel. Secondary Dressing: Foam Border - bordered gauze. Edema Control: Avoid standing for long periods of time Elevate legs to the level of the heart or above for 30 minutes daily and/or when sitting, a frequency of: - throughout the day. Exercise regularly Off-Loading: Other: - try to ensure no pressure is on the wound on the foot. 1. Silver collagen with border foam to continue we are making good progress. 2. Extensive ethical discussion with the patient's wife again today. The issue is whether to proceed with further noninvasive studies and where that might lead to in terms of investigations that the wife would see beneficial to her husband. I.e. angiogram Electronic Signature(s) Signed: 11/01/2019 8:05:25 AM By: Linton Ham MD Entered By: Linton Ham on 10/31/2019  14:42:48 -------------------------------------------------------------------------------- SuperBill Details Patient Name: Date of Service: Daniel Chapman, Daniel BERT D. 10/31/2019 Medical Record Number: [id number] Patient Account Number: [id number] Date  of Birth/Sex: Treating RN: Oct 19, 1931 (86 y.o. [name] Primary Care Provider: [name] Other Clinician: Referring Provider: Treating Provider/Extender: Franki Cabot in Treatment: 4 Diagnosis Coding ICD-10 Codes Code Description [phone] Laceration without foreign body, right foot, subsequent encounter I70.235 Atherosclerosis of native arteries of right leg with ulceration of other part of foot L03.115 Cellulitis of right lower limb Facility Procedures CPT4 Code: [id number] Description: 99213 - WOUND CARE VISIT-LEV 3 EST PT Modifier: Quantity: 1 Physician Procedures : CPT4 Code Description Modifier [id number] - WC PHYS LEVEL 3 - EST PT ICD-10 Diagnosis Description T41.962I Laceration without foreign body, right foot, subsequent encounter I70.235 Atherosclerosis of native arteries of right leg with ulceration of  other part of foot Quantity: 1 Electronic Signature(s) Signed: 11/01/2019 8:05:25 AM By: Linton Ham MD Entered By: Linton Ham on 10/31/2019 14:43:07

## 2019-11-04 NOTE — Progress Notes (Signed)
LISTON, THUM ([id number]) Visit Report for 10/31/2019 Arrival Information Details Patient Name: Date of Service: [place], [name] 10/31/2019 1:30 PM Medical Record Number: [id number] Patient Account Number: [id number] Date of Birth/Sex: Treating RN: 14-Nov-1931 (86 y.o. [name] Primary Care Chamari Cutbirth: [name] Other Clinician: Referring Maribeth Jiles: Treating Teal Bontrager/Extender: Franki Cabot in Treatment: 4 Visit Information History Since Last Visit Added or deleted any medications: No Patient Arrived: Ambulatory Any new allergies or adverse reactions: No Arrival Time: 13:59 Had a fall or experienced change in No Accompanied By: wife activities of daily living that may affect Transfer Assistance: None risk of falls: Patient Identification Verified: Yes Signs or symptoms of abuse/neglect since last visito No Secondary Verification Process Completed: Yes Hospitalized since last visit: No Patient Requires Transmission-Based Precautions: No Implantable device outside of the clinic excluding No Patient Has Alerts: Yes cellular tissue based products placed in the center Patient Alerts: Patient on Blood Thinner since last visit: Has Dressing in Place as Prescribed: Yes Pain Present Now: No Electronic Signature(s) Signed: 11/04/2019 1:20:36 PM By: Sandre Kitty Entered By: Sandre Kitty on 10/31/2019 13:59:32 -------------------------------------------------------------------------------- Clinic Level of Care Assessment Details Patient Name: Date of Service: STA RR, [name] 10/31/2019 1:30 PM Medical Record Number: [id number] Patient Account Number: [id number] Date of Birth/Sex: Treating RN: January 18, 1932 (86 y.o. [name] Primary Care Mylin Gignac: [name] Other Clinician: Referring Tysheka Fanguy: Treating Sabre Leonetti/Extender: Franki Cabot in Treatment: 4 Clinic Level of Care Assessment Items TOOL 4 Quantity  Score X- 1 0 Use when only an EandM is performed on FOLLOW-UP visit ASSESSMENTS - Nursing Assessment / Reassessment X- 1 10 Reassessment of Co-morbidities (includes updates in patient status) X- 1 5 Reassessment of Adherence to Treatment Plan ASSESSMENTS - Wound and Skin A ssessment / Reassessment X - Simple Wound Assessment / Reassessment - one wound 1 5 _0  - 0 Complex Wound Assessment / Reassessment - multiple wounds X- 1 10 Dermatologic / Skin Assessment (not related to wound area) ASSESSMENTS - Focused Assessment X- 1 5 Circumferential Edema Measurements - multi extremities X- 1 10 Nutritional Assessment / Counseling / Intervention _1  - 0 Lower Extremity Assessment (monofilament, tuning fork, pulses) _2  - 0 Peripheral Arterial Disease Assessment (using hand held doppler) ASSESSMENTS - Ostomy and/or Continence Assessment and Care _3  - 0 Incontinence Assessment and Management _4  - 0 Ostomy Care Assessment and Management (repouching, etc.) PROCESS - Coordination of Care X - Simple Patient / Family Education for ongoing care 1 15 _5  - 0 Complex (extensive) Patient / Family Education for ongoing care X- 1 10 Staff obtains Programmer, systems, Records, T Results / Process Orders est _6  - 0 Staff telephones HHA, Nursing Homes / Clarify orders / etc _7  - 0 Routine Transfer to another Facility (non-emergent condition) _8  - 0 Routine Hospital Admission (non-emergent condition) _9  - 0 New Admissions / Biomedical engineer / Ordering NPWT Apligraf, etc. , _10  - 0 Emergency Hospital Admission (emergent condition) X- 1 10 Simple Discharge Coordination _11  - 0 Complex (extensive) Discharge Coordination PROCESS - Special Needs _12  - 0 Pediatric / Minor Patient Management _13  - 0 Isolation Patient Management _14  - 0 Hearing / Language / Visual special needs _15  - 0 Assessment of Community assistance (transportation, D/C planning, etc.) _16  - 0 Additional assistance / Altered  mentation _17  - 0 Support Surface(s) Assessment (bed, cushion, seat, etc.) INTERVENTIONS - Wound Cleansing / Measurement X - Simple Wound Cleansing - one wound 1  5 _0  - 0 Complex Wound Cleansing - multiple wounds X- 1 5 Wound Imaging (photographs - any number of wounds) _1  - 0 Wound Tracing (instead of photographs) X- 1 5 Simple Wound Measurement - one wound _2  - 0 Complex Wound Measurement - multiple wounds INTERVENTIONS - Wound Dressings X - Small Wound Dressing one or multiple wounds 1 10 _3  - 0 Medium Wound Dressing one or multiple wounds _4  - 0 Large Wound Dressing one or multiple wounds <[id number]>_7<\/CBULAGTXMIWOEHOZ>_2  - 0 Application of Medications - topical <YQMGNOIBBCWUGQBV>_6<\/XIHWTUUEKCMKLKJZ>_7  - 0 Application of Medications - injection INTERVENTIONS - Miscellaneous _7  - 0 External ear exam _8  - 0 Specimen Collection (cultures, biopsies, blood, body fluids, etc.) _9  - 0 Specimen(s) / Culture(s) sent or taken to Lab for analysis _10  - 0 Patient Transfer (multiple staff / Civil Service fast streamer / Similar devices) _11  - 0 Simple Staple / Suture removal (25 or less) _12  - 0 Complex Staple / Suture removal (26 or more) _13  - 0 Hypo / Hyperglycemic Management (close monitor of Blood Glucose) _14  - 0 Ankle / Brachial Index (ABI) - do not check if billed separately X- 1 5 Vital Signs Has the patient been seen at the hospital within the last three years: Yes Total Score: 110 Level Of Care: New/Established - Level 3 Electronic Signature(s) Signed: 10/31/2019 6:01:01 PM By: Deon Pilling Entered By: Deon Pilling on 10/31/2019 14:23:15 -------------------------------------------------------------------------------- Encounter Discharge Information Details Patient Name: Date of Service: [name], [name] 10/31/2019 1:30 PM Medical Record Number: [id number] Patient Account Number: [id number] Date of Birth/Sex: Treating RN: 1931-09-05 (86 y.o. Ernestene Mention Primary Care Kwesi Sangha: [name] Other Clinician: Referring Eulia Hatcher: Treating  Laurelle Skiver/Extender: Franki Cabot in Treatment: 4 Encounter Discharge Information Items Discharge Condition: Stable Ambulatory Status: Ambulatory Discharge Destination: Home Transportation: Private Auto Accompanied By: self Schedule Follow-up Appointment: Yes Clinical Summary of Care: Patient Declined Electronic Signature(s) Signed: 10/31/2019 5:32:26 PM By: Baruch Gouty RN, BSN Entered By: Baruch Gouty on 10/31/2019 14:34:35 -------------------------------------------------------------------------------- Lower Extremity Assessment Details Patient Name: Date of Service: [name], [name] 10/31/2019 1:30 PM Medical Record Number: [id number] Patient Account Number: [id number] Date of Birth/Sex: Treating RN: 10-Feb-1932 (86 y.o. Marvis Repress Primary Care Andrena Margerum: [name] Other Clinician: Referring Laramie Gelles: Treating Lis Savitt/Extender: Franki Cabot in Treatment: 4 Edema Assessment Assessed: [Left: No] [Right: No] Edema: [Left: N] [Right: o] Calf Left: Right: Point of Measurement: 37 cm From Medial Instep cm 27 cm Ankle Left: Right: Point of Measurement: 10 cm From Medial Instep cm 18 cm Vascular Assessment Pulses: Dorsalis Pedis Palpable: [Right:Yes] Electronic Signature(s) Signed: 10/31/2019 6:00:39 PM By: Kela Millin Entered By: Kela Millin on 10/31/2019 14:04:52 -------------------------------------------------------------------------------- Multi Wound Chart Details Patient Name: Date of Service: [name], [name] 10/31/2019 1:30 PM Medical Record Number: [id number] Patient Account Number: [id number] Date of Birth/Sex: Treating RN: Apr 12, 1932 (86 y.o. [name] Primary Care Dorean Daniello: [name] Other Clinician: Referring Keisi Eckford: Treating Sofhia Ulibarri/Extender: Franki Cabot in Treatment: 4 Vital Signs Height(in): 68 Pulse(bpm): 77 Weight(lbs): 123 Blood  Pressure(mmHg): 122/69 Body Mass Index(BMI): 19 Temperature(F): 98.1 Respiratory Rate(breaths/min): 18 Photos: [1:No Photos Right, Dorsal Foot] [N/A:N/A N/A] Wound Location: [1:Trauma] [N/A:N/A] Wounding Event: [1:Arterial Insufficiency Ulcer] [N/A:N/A] Primary Etiology: [1:Dementia, Received Radiation] [N/A:N/A] Comorbid History: [1:09/08/2019] [N/A:N/A] Date Acquired: [1:4] [N/A:N/A] Weeks of Treatment: [1:Open] [N/A:N/A] Wound Status: [1:0.3x0.2x0.1] [N/A:N/A] Measurements L x W x D (cm) [1:0.047] [N/A:N/A] A (cm) : rea [1:0.005] [N/A:N/A]  Volume (cm) : [1:98.10%] [N/A:N/A] % Reduction in A rea: [1:98.00%] [N/A:N/A] % Reduction in Volume: [1:Full Thickness Without Exposed] [N/A:N/A] Classification: [1:Support Structures Medium] [N/A:N/A] Exudate Amount: [1:Serosanguineous] [N/A:N/A] Exudate Type: [1:red, brown] [N/A:N/A] Exudate Color: [1:Flat and Intact] [N/A:N/A] Wound Margin: [1:Large (67-100%)] [N/A:N/A] Granulation Amount: [1:Red, Pink] [N/A:N/A] Granulation Quality: [1:Small (1-33%)] [N/A:N/A] Necrotic Amount: [1:Fat Layer (Subcutaneous Tissue)] [N/A:N/A] Exposed Structures: [1:Exposed: Yes Fascia: No Tendon: No Muscle: No Joint: No Bone: No Small (1-33%)] [N/A:N/A] Treatment Notes Wound #1 (Right, Dorsal Foot) 2. Periwound Care Skin Prep 3. Primary Dressing Applied Collegen AG 4. Secondary Dressing Foam Border Dressing Electronic Signature(s) Signed: 10/31/2019 6:01:01 PM By: Deon Pilling Signed: 11/01/2019 8:05:25 AM By: Linton Ham MD Entered By: Linton Ham on 10/31/2019 14:38:05 -------------------------------------------------------------------------------- Multi-Disciplinary Care Plan Details Patient Name: Date of Service: [name], [name] 10/31/2019 1:30 PM Medical Record Number: [id number] Patient Account Number: [id number] Date of Birth/Sex: Treating RN: 1931/10/06 (86 y.o. [name] Primary Care Dmarcus Decicco: [name] Other  Clinician: Referring Janesa Dockery: Treating Sabrena Gavitt/Extender: Franki Cabot in Treatment: 4 Active Inactive Pain, Acute or Chronic Nursing Diagnoses: Pain, acute or chronic: actual or potential Potential alteration in comfort, pain Goals: Patient will verbalize adequate pain control and receive pain control interventions during procedures as needed Date Initiated: 10/03/2019 Target Resolution Date: 10/31/2019 Goal Status: Active Patient/caregiver will verbalize comfort level met Date Initiated: 10/03/2019 Target Resolution Date: 11/01/2019 Goal Status: Active Interventions: Encourage patient to take pain medications as prescribed Provide education on pain management Reposition patient for comfort Treatment Activities: Administer pain control measures as ordered : 10/03/2019 Notes: Wound/Skin Impairment Nursing Diagnoses: Knowledge deficit related to ulceration/compromised skin integrity Goals: Patient/caregiver will verbalize understanding of skin care regimen Date Initiated: 10/03/2019 Target Resolution Date: 11/01/2019 Goal Status: Active Interventions: Assess patient/caregiver ability to obtain necessary supplies Assess patient/caregiver ability to perform ulcer/skin care regimen upon admission and as needed Assess ulceration(s) every visit Provide education on ulcer and skin care Treatment Activities: Skin care regimen initiated : 10/03/2019 Topical wound management initiated : 10/03/2019 Notes: Electronic Signature(s) Signed: 10/31/2019 6:01:01 PM By: Deon Pilling Signed: 10/31/2019 6:01:01 PM By: Deon Pilling Entered By: Deon Pilling on 10/31/2019 13:54:36 -------------------------------------------------------------------------------- Pain Assessment Details Patient Name: Date of Service: [name], [name] 10/31/2019 1:30 PM Medical Record Number: [id number] Patient Account Number: [id number] Date of Birth/Sex: Treating RN: 1932-04-08 (86 y.o. [name] Primary Care Harce Volden: [name] Other Clinician: Referring Garhett Bernhard: Treating Ji Feldner/Extender: Franki Cabot in Treatment: 4 Active Problems Location of Pain Severity and Description of Pain Patient Has Paino No Site Locations Pain Management and Medication Current Pain Management: Electronic Signature(s) Signed: 10/31/2019 6:01:01 PM By: Deon Pilling Signed: 11/04/2019 1:20:36 PM By: Sandre Kitty Entered By: Sandre Kitty on 10/31/2019 13:59:55 -------------------------------------------------------------------------------- Patient/Caregiver Education Details Patient Name: Date of Service: [name], [name] 6/3/2021andnbsp1:30 PM Medical Record Number: [id number] Patient Account Number: [id number] Date of Birth/Gender: Treating RN: 16-Jan-1932 (86 y.o. [name] Primary Care Physician: [place], [place] Other Clinician: Referring Physician: Treating Physician/Extender: Franki Cabot in Treatment: 4 Education Assessment Education Provided To: Patient Education Topics Provided Pain: Handouts: A Guide to Pain Control Methods: Explain/Verbal Responses: Reinforcements needed Electronic Signature(s) Signed: 10/31/2019 6:01:01 PM By: Deon Pilling Entered By: Deon Pilling on 10/31/2019 13:54:45 -------------------------------------------------------------------------------- Wound Assessment Details Patient Name: Date of Service: [name], [name] 10/31/2019 1:30 PM Medical Record Number: [id number] Patient Account Number: [id number]  Date of Birth/Sex: Treating RN: May 06, 1932 (86 y.o. [name] Primary Care Lylee Corrow: [name] Other Clinician: Referring Addisyn Leclaire: Treating Tama Grosz/Extender: Franki Cabot in Treatment: 4 Wound Status Wound Number: 1 Primary Etiology: Arterial Insufficiency Ulcer Wound Location: Right, Dorsal Foot Wound Status: Open Wounding Event:  Trauma Comorbid History: Dementia, Received Radiation Date Acquired: 09/08/2019 Weeks Of Treatment: 4 Clustered Wound: No Photos Photo Uploaded By: Mikeal Hawthorne on 11/04/2019 11:52:00 Wound Measurements Length: (cm) 0.3 Width: (cm) 0.2 Depth: (cm) 0.1 Area: (cm) 0.047 Volume: (cm) 0.005 % Reduction in Area: 98.1% % Reduction in Volume: 98% Epithelialization: Small (1-33%) Tunneling: No Undermining: No Wound Description Classification: Full Thickness Without Exposed Support Structures Wound Margin: Flat and Intact Exudate Amount: Medium Exudate Type: Serosanguineous Exudate Color: red, brown Foul Odor After Cleansing: No Slough/Fibrino Yes Wound Bed Granulation Amount: Large (67-100%) Exposed Structure Granulation Quality: Red, Pink Fascia Exposed: No Necrotic Amount: Small (1-33%) Fat Layer (Subcutaneous Tissue) Exposed: Yes Necrotic Quality: Adherent Slough Tendon Exposed: No Muscle Exposed: No Joint Exposed: No Bone Exposed: No Treatment Notes Wound #1 (Right, Dorsal Foot) 2. Periwound Care Skin Prep 3. Primary Dressing Applied Collegen AG 4. Secondary Dressing Foam Border Dressing Electronic Signature(s) Signed: 10/31/2019 6:00:39 PM By: Kela Millin Signed: 10/31/2019 6:01:01 PM By: Deon Pilling Entered By: Kela Millin on 10/31/2019 14:05:10 -------------------------------------------------------------------------------- Vitals Details Patient Name: Date of Service: [name], [name] 10/31/2019 1:30 PM Medical Record Number: [id number] Patient Account Number: [id number] Date of Birth/Sex: Treating RN: 11/17/1931 (86 y.o. [name] Primary Care Loda Bialas: [name] Other Clinician: Referring Kendi Defalco: Treating Myalynn Lingle/Extender: Franki Cabot in Treatment: 4 Vital Signs Time Taken: 13:59 Temperature (F): 98.1 Height (in): 68 Pulse (bpm): 77 Weight (lbs): 123 Respiratory Rate (breaths/min): 18 Body  Mass Index (BMI): 18.7 Blood Pressure (mmHg): 122/69 Reference Range: 80 - 120 mg / dl Electronic Signature(s) Signed: 11/04/2019 1:20:36 PM By: Sandre Kitty Entered By: Sandre Kitty on 10/31/2019 13:59:49

## 2019-11-13 DIAGNOSIS — I4819 Other persistent atrial fibrillation: Secondary | ICD-10-CM | POA: Diagnosis not present

## 2019-11-13 DIAGNOSIS — J449 Chronic obstructive pulmonary disease, unspecified: Secondary | ICD-10-CM | POA: Diagnosis not present

## 2019-11-13 DIAGNOSIS — F17200 Nicotine dependence, unspecified, uncomplicated: Secondary | ICD-10-CM | POA: Diagnosis not present

## 2019-11-13 DIAGNOSIS — I714 Abdominal aortic aneurysm, without rupture: Secondary | ICD-10-CM | POA: Diagnosis not present

## 2019-11-14 ENCOUNTER — Other Ambulatory Visit: Payer: Self-pay

## 2019-11-14 ENCOUNTER — Encounter (HOSPITAL_BASED_OUTPATIENT_CLINIC_OR_DEPARTMENT_OTHER): Payer: Medicare Other | Admitting: Internal Medicine

## 2019-11-14 DIAGNOSIS — Z85819 Personal history of malignant neoplasm of unspecified site of lip, oral cavity, and pharynx: Secondary | ICD-10-CM | POA: Diagnosis not present

## 2019-11-14 DIAGNOSIS — I70235 Atherosclerosis of native arteries of right leg with ulceration of other part of foot: Secondary | ICD-10-CM | POA: Diagnosis not present

## 2019-11-14 DIAGNOSIS — Z923 Personal history of irradiation: Secondary | ICD-10-CM | POA: Diagnosis not present

## 2019-11-14 DIAGNOSIS — Z7901 Long term (current) use of anticoagulants: Secondary | ICD-10-CM | POA: Diagnosis not present

## 2019-11-14 DIAGNOSIS — L03115 Cellulitis of right lower limb: Secondary | ICD-10-CM | POA: Diagnosis not present

## 2019-11-14 DIAGNOSIS — S91311A Laceration without foreign body, right foot, initial encounter: Secondary | ICD-10-CM | POA: Diagnosis not present

## 2019-11-14 DIAGNOSIS — I4891 Unspecified atrial fibrillation: Secondary | ICD-10-CM | POA: Diagnosis not present

## 2019-11-14 DIAGNOSIS — S91311D Laceration without foreign body, right foot, subsequent encounter: Secondary | ICD-10-CM | POA: Diagnosis not present

## 2019-11-15 NOTE — Progress Notes (Addendum)
Daniel Chapman, Daniel Chapman ([id number]) Visit Report for 11/14/2019 Arrival Information Details Patient Name: Date of Service: [name], Daniel D. 11/14/2019 1:30 PM Medical Chapman Number: [id number] Patient Account Number: [id number] Date of Birth/Sex: Treating RN: [birth year] (86 y.o. Daniel Chapman) Daniel Chapman Primary Care Daniel Chapman: Daniel Chapman Other Clinician: Referring Daniel Chapman: Treating Daniel Chapman in Treatment: 6 Visit Information History Since Last Visit All ordered tests and consults were completed: No Patient Arrived: Ambulatory Added or deleted any medications: No Arrival Time: 14:13 Any new allergies or adverse reactions: No Accompanied By: wife Had Chapman fall or experienced change in No Transfer Assistance: None activities of daily living that may affect Patient Identification Verified: Yes risk of falls: Secondary Verification Process Completed: Yes Signs or symptoms of abuse/neglect since last visito No Patient Requires Transmission-Based Precautions: No Hospitalized since last visit: No Patient Has Alerts: Yes Implantable device outside of [facility] clinic excluding No Patient Alerts: Patient on Blood Thinner cellular tissue based products placed in [facility] since last visit: Has Dressing in Place as Prescribed: Yes Pain Present Now: No Electronic Signature(s) Signed: 11/14/2019 4:10:00 PM By: Daniel Coria RN Entered By: Daniel Chapman on 11/14/2019 14:16:41 -------------------------------------------------------------------------------- Clinic Level of Care Assessment Details Patient Name: Date of Service: Daniel Chapman, Daniel D. 11/14/2019 1:30 PM Medical Chapman Number: [id number] Patient Account Number: [id number] Date of Birth/Sex: Treating RN: 1931/09/21 (86 y.o. Daniel Chapman Primary Care Daniel Chapman: Daniel Chapman Other Clinician: Referring Daniel Chapman: Treating Daniel Chapman/Extender: Daniel Chapman in Treatment: 6 Clinic Level  of Care Assessment Items TOOL 4 Quantity Score X- 1 0 Use when only an EandM is performed on FOLLOW-UP visit ASSESSMENTS - Nursing Assessment / Reassessment X- 1 10 Reassessment of Co-morbidities (includes updates in patient status) X- 1 5 Reassessment of Adherence to Treatment Plan ASSESSMENTS - Wound and Skin Chapman ssessment / Reassessment X - Simple Wound Assessment / Reassessment - one wound 1 5 []  - 0 Complex Wound Assessment / Reassessment - multiple wounds X- 1 10 Dermatologic / Skin Assessment (not related to wound area) ASSESSMENTS - Focused Assessment X- 1 5 Circumferential Edema Measurements - multi extremities X- 1 10 Nutritional Assessment / Counseling / Intervention []  - 0 Lower Extremity Assessment (monofilament, tuning fork, pulses) []  - 0 Peripheral Arterial Disease Assessment (using hand held doppler) ASSESSMENTS - Ostomy and/or Continence Assessment and Care []  - 0 Incontinence Assessment and Management []  - 0 Ostomy Care Assessment and Management (repouching, etc.) PROCESS - Coordination of Care X - Simple Patient / Family Education for ongoing care 1 15 []  - 0 Complex (extensive) Patient / Family Education for ongoing care X- 1 10 Staff obtains Programmer, systems, Records, T Results / Process Orders est []  - 0 Staff telephones HHA, Nursing Homes / Clarify orders / etc []  - 0 Routine Transfer to another Facility (non-emergent condition) []  - 0 Routine Hospital Admission (non-emergent condition) []  - 0 New Admissions / Biomedical engineer / Ordering NPWT Apligraf, etc. , []  - 0 Emergency Hospital Admission (emergent condition) X- 1 10 Simple Discharge Coordination []  - 0 Complex (extensive) Discharge Coordination PROCESS - Special Needs []  - 0 Pediatric / Minor Patient Management []  - 0 Isolation Patient Management []  - 0 Hearing / Language / Visual special needs []  - 0 Assessment of Community assistance (transportation, D/C planning, etc.) []  -  0 Additional assistance / Altered mentation []  - 0 Support Surface(s) Assessment (bed, cushion, seat, etc.) INTERVENTIONS - Wound Cleansing / Measurement  X - Simple Wound Cleansing - one wound 1 5 []  - 0 Complex Wound Cleansing - multiple wounds X- 1 5 Wound Imaging (photographs - any number of wounds) []  - 0 Wound Tracing (instead of photographs) X- 1 5 Simple Wound Measurement - one wound []  - 0 Complex Wound Measurement - multiple wounds INTERVENTIONS - Wound Dressings X - Small Wound Dressing one or multiple wounds 1 10 []  - 0 Medium Wound Dressing one or multiple wounds []  - 0 Large Wound Dressing one or multiple wounds []  - 0 Application of Medications - topical []  - 0 Application of Medications - injection INTERVENTIONS - Miscellaneous []  - 0 External ear exam []  - 0 Specimen Collection (cultures, biopsies, blood, body fluids, etc.) []  - 0 Specimen(s) / Culture(s) sent or taken to Lab for analysis []  - 0 Patient Transfer (multiple staff / Civil Service fast streamer / Similar devices) []  - 0 Simple Staple / Suture removal (25 or less) []  - 0 Complex Staple / Suture removal (26 or more) []  - 0 Hypo / Hyperglycemic Management (close monitor of Blood Glucose) []  - 0 Ankle / Brachial Index (ABI) - do not check if billed separately X- 1 5 Vital Signs Has Daniel patient been seen at [facility] hospital within Daniel last three years: Yes Total Score: 110 Level Of Care: New/Established - Level 3 Electronic Signature(s) Signed: 11/14/2019 5:45:21 PM By: Daniel Chapman Entered By: Daniel Chapman on 11/14/2019 14:59:20 -------------------------------------------------------------------------------- Encounter Discharge Information Details Patient Name: Date of Service: Daniel Chapman, Daniel BERT D. 11/14/2019 1:30 PM Medical Chapman Number: [id number] Patient Account Number: [id number] Date of Birth/Sex: Treating RN: 10-20-1931 (86 y.o. Daniel Chapman Primary Care Daniel Chapman: Daniel Chapman Other  Clinician: Referring Daniel Chapman: Treating Daniel Chapman/Extender: Daniel Chapman in Treatment: 6 Encounter Discharge Information Items Discharge Condition: Stable Ambulatory Status: Ambulatory Discharge Destination: Home Transportation: Private Auto Accompanied By: self Schedule Follow-up Appointment: No Clinical Summary of Care: Notes Foam border applied to right foot for protection to closed area. Electronic Signature(s) Signed: 11/14/2019 5:45:21 PM By: Daniel Chapman Entered By: Daniel Chapman on 11/14/2019 15:00:11 -------------------------------------------------------------------------------- Multi Wound Chart Details Patient Name: Date of Service: [name], Daniel BERT D. 11/14/2019 1:30 PM Medical Chapman Number: [id number] Patient Account Number: [id number] Date of Birth/Sex: Treating RN: June 08, 1931 (86 y.o. Daniel Chapman Primary Care Garey Alleva: Daniel Chapman Other Clinician: Referring Ayron Fillinger: Treating Javani Spratt/Extender: Daniel Chapman in Treatment: 6 Vital Signs Height(in): 68 Pulse(bpm): 69 Weight(lbs): 123 Blood Pressure(mmHg): 120/79 Body Mass Index(BMI): 19 Temperature(F): 97.9 Respiratory Rate(breaths/min): 18 Photos: [1:No Photos Right, Dorsal Foot] [N/Chapman:N/Chapman N/Chapman] Wound Location: [1:Trauma] [N/Chapman:N/Chapman] Wounding Event: [1:Arterial Insufficiency Ulcer] [N/Chapman:N/Chapman] Primary Etiology: [1:Dementia, Received Radiation] [N/Chapman:N/Chapman] Comorbid History: [1:09/08/2019] [N/Chapman:N/Chapman] Date Acquired: [1:6] [N/Chapman:N/Chapman] Weeks of Treatment: [1:Open] [N/Chapman:N/Chapman] Wound Status: [1:0x0x0] [N/Chapman:N/Chapman] Measurements L x W x D (cm) [1:0] [N/Chapman:N/Chapman] Chapman (cm) : rea [1:0] [N/Chapman:N/Chapman] Volume (cm) : [1:100.00%] [N/Chapman:N/Chapman] % Reduction in Chapman rea: [1:100.00%] [N/Chapman:N/Chapman] % Reduction in Volume: [1:Full Thickness Without Exposed] [N/Chapman:N/Chapman] Classification: [1:Support Structures None Present] [N/Chapman:N/Chapman] Exudate Amount: [1:Flat and Intact] [N/Chapman:N/Chapman] Wound Margin: [1:None Present  (0%)] [N/Chapman:N/Chapman] Granulation Amount: [1:None Present (0%)] [N/Chapman:N/Chapman] Necrotic Amount: [1:Fascia: No] [N/Chapman:N/Chapman] Exposed Structures: [1:Fat Layer (Subcutaneous Tissue) Exposed: No Tendon: No Muscle: No Joint: No Bone: No Large (67-100%)] [N/Chapman:N/Chapman] Treatment Notes Electronic Signature(s) Signed: 11/14/2019 5:45:21 PM By: Daniel Chapman Signed: 11/15/2019 7:45:08 AM By: Linton Ham MD Entered By: Linton Ham on 11/14/2019 15:27:32 -------------------------------------------------------------------------------- Multi-Disciplinary Care Plan Details Patient Name: Date of  Service: STA RR, Daniel BERT D. 11/14/2019 1:30 PM Medical Chapman Number: [id number] Patient Account Number: [id number] Date of Birth/Sex: Treating RN: Dec 11, 1931 (86 y.o. Daniel Chapman Primary Care Saidi Santacroce: Daniel Chapman Other Clinician: Referring Lorelle Macaluso: Treating Suleyma Wafer/Extender: Daniel Chapman in Treatment: 6 Active Inactive Electronic Signature(s) Signed: 11/14/2019 5:45:21 PM By: Daniel Chapman Entered By: Daniel Chapman on 11/14/2019 14:54:20 -------------------------------------------------------------------------------- Pain Assessment Details Patient Name: Date of Service: [name], Daniel BERT D. 11/14/2019 1:30 PM Medical Chapman Number: [id number] Patient Account Number: [id number] Date of Birth/Sex: Treating RN: 06/19/1931 (86 y.o. Oval Linsey Primary Care Junior Huezo: Daniel Chapman Other Clinician: Referring Starnisha Batrez: Treating Andy Allende/Extender: Daniel Chapman in Treatment: 6 Active Problems Location of Pain Severity and Description of Pain Patient Has Paino No Site Locations Pain Management and Medication Current Pain Management: Electronic Signature(s) Signed: 11/14/2019 4:10:00 PM By: Daniel Coria RN Entered By: Daniel Chapman on 11/14/2019 14:17:11 -------------------------------------------------------------------------------- Patient/Caregiver  Education Details Patient Name: Date of Service: Daniel Chapman, Daniel BERT D. 6/17/2021andnbsp1:30 PM Medical Chapman Number: [id number] Patient Account Number: [id number] Date of Birth/Gender: Treating RN: May 27, 1932 (86 y.o. Daniel Chapman Primary Care Physician: [place], [place] Other Clinician: Referring Physician: Treating Physician/Extender: Daniel Chapman in Treatment: 6 Education Assessment Education Provided To: Patient Education Topics Provided Pain: Handouts: Chapman Guide to Pain Control Methods: Explain/Verbal Responses: Reinforcements needed Electronic Signature(s) Signed: 11/14/2019 5:45:21 PM By: Daniel Chapman Entered By: Daniel Chapman on 11/14/2019 13:42:51 -------------------------------------------------------------------------------- Wound Assessment Details Patient Name: Date of Service: [name], Daniel BERT D. 11/14/2019 1:30 PM Medical Chapman Number: [id number] Patient Account Number: [id number] Date of Birth/Sex: Treating RN: [birth year] (86 y.o. Daniel Chapman) Daniel Chapman Primary Care Revan Gendron: Daniel Chapman Other Clinician: Referring Mattilyn Crites: Treating Sonna Lipsky/Extender: Benson Setting Weeks in Treatment: 6 Wound Status Wound Number: 1 Primary Etiology: Arterial Insufficiency Ulcer Wound Location: Right, Dorsal Foot Wound Status: Healed - Epithelialized Wounding Event: Trauma Comorbid History: Dementia, Received Radiation Date Acquired: 09/08/2019 Weeks Of Treatment: 6 Clustered Wound: No Photos Wound Measurements Length: (cm) Width: (cm) Depth: (cm) Area: (cm) Volume: (cm) 0 % Reduction in Area: 100% 0 % Reduction in Volume: 100% 0 Epithelialization: Large (67-100%) 0 Tunneling: No 0 Undermining: No Wound Description Classification: Full Thickness Without Exposed Support Structures Wound Margin: Flat and Intact Exudate Amount: None Present Foul Odor After Cleansing: No Slough/Fibrino No Wound Bed Granulation Amount: None  Present (0%) Exposed Structure Necrotic Amount: None Present (0%) Fascia Exposed: No Fat Layer (Subcutaneous Tissue) Exposed: No Tendon Exposed: No Muscle Exposed: No Joint Exposed: No Bone Exposed: No Electronic Signature(s) Signed: 11/15/2019 5:20:34 PM By: Daniel Coria RN Signed: 11/18/2019 5:11:39 PM By: Minerva Fester Previous Signature: 11/14/2019 4:10:00 PM Version By: Daniel Coria RN Entered By: Minerva Fester on 11/15/2019 14:07:14 -------------------------------------------------------------------------------- Vitals Details Patient Name: Date of Service: [name], Daniel BERT D. 11/14/2019 1:30 PM Medical Chapman Number: [id number] Patient Account Number: [id number] Date of Birth/Sex: Treating RN: 1931-12-11 (86 y.o. Daniel Chapman) Daniel Chapman Primary Care Jakwan Sally: Daniel Chapman Other Clinician: Referring Jerzy Roepke: Treating Gaddiel Cullens/Extender: Benson Setting Weeks in Treatment: 6 Vital Signs Time Taken: 14:14 Temperature (F): 97.9 Height (in): 68 Pulse (bpm): 69 Weight (lbs): 123 Respiratory Rate (breaths/min): 18 Body Mass Index (BMI): 18.7 Blood Pressure (mmHg): 120/79 Reference Range: 80 - 120 mg / dl Electronic Signature(s) Signed: 11/14/2019 4:10:00 PM By: Daniel Coria RN Entered By: Daniel Chapman on 11/14/2019 14:17:04

## 2019-11-15 NOTE — Progress Notes (Signed)
Daniel Chapman, Daniel Chapman ([id number]) Visit Report for 11/14/2019 HPI Details Patient Name: Date of Service: [place], [name] 11/14/2019 1:30 PM Medical Record Number: [id number] Patient Account Number: [id number] Date of Birth/Sex: Treating RN: 1932-02-24 (86 y.o. Daniel Chapman Primary Care Provider: [name] Other Clinician: Referring Provider: Treating Provider/Extender: Franki Cabot in Treatment: 6 History of Present Illness HPI Description: ADMISSION 10/03/2019 This is a 86 year old man who traumatized his right foot when he got up in the middle of the night on a night light. This was in the middle of April. Came to attention of his primary physician on 4/26. The area was red and open. He was given a course of cephalexin with some improvement. He was seen yesterday in follow-up again by primary care he was given another round of Keflex although noted that he was considerably better. They have been using Neosporin and a Band-Aid on the wound area. The patient has multi-infarct dementia and is really not able to respond to questions all that much. He does not appear to be in a lot of discomfort. Past medical history chronic A. fib on Eliquis, vascular dementia, hyperlipidemia, continued tobacco abuse, COPD, coronary artery disease and a history of oropharyngeal cancer for which she received radiation in 2012 ABI in our clinic was 0.41 5/18; wound on the right dorsal foot from trauma. He likely has severe PAD. He gets cramps at night according to his wife. We are using silver collagen. He is somewhat disinhibited and loud. It is difficult to see him getting through noninvasive arterial studies and certainly not an angiogram without significant anesthesia 6/3; patient's wound on the right dorsal foot which was initially traumatic continues to get smaller. He does not appear to have any pain however he likely has severe PAD. In spite of an ABI in our clinic of 0.41 the  wound is actually contracted. Again I had a long conversation about going forward with formal arterial studies in the right leg with somebody with severe dementia likely vascular per my notes. The issue would be whether or not to proceed with an angiogram or given his dementia would we be even able to do an angiogram. I spent some time talking to the patient's wife about this today. I do not think we are under any pressure to make a decision here because the wound is healing and as far as I can tell the patient is relatively asymptomatic 6/17; the patient's wound on the dorsal right foot which was initially trauma has closed. This was in spite of really significant PAD and some apical discussions I had about going forward with formal evaluations. Fortunately this was a traumatic wound and not necessarily a purely ischemic 1. We will watch this over time. Electronic Signature(s) Signed: 11/15/2019 7:45:08 AM By: Linton Ham MD Entered By: Linton Ham on 11/14/2019 15:28:38 -------------------------------------------------------------------------------- Physical Exam Details Patient Name: Date of Service: STA Daniel Chapman, [name] 11/14/2019 1:30 PM Medical Record Number: [id number] Patient Account Number: [id number] Date of Birth/Sex: Treating RN: 1931-12-27 (86 y.o. Daniel Chapman Primary Care Provider: [name] Other Clinician: Referring Provider: Treating Provider/Extender: Franki Cabot in Treatment: 6 Constitutional Sitting or standing Blood Pressure is within target range for patient.. Pulse regular and within target range for patient.Marland Kitchen Respirations regular, non-labored and within target range.. Temperature is normal and within the target range for the patient.Marland Kitchen Appears in no distress. Cardiovascular Pedal pulses are absent on the right  foot is cool. Notes Wound exam; small wound on the right dorsal foot is totally closed over. Electronic  Signature(s) Signed: 11/15/2019 7:45:08 AM By: Linton Ham MD Entered By: Linton Ham on 11/14/2019 15:29:13 -------------------------------------------------------------------------------- Physician Orders Details Patient Name: Date of Service: STA Daniel Chapman, [name] 11/14/2019 1:30 PM Medical Record Number: [id number] Patient Account Number: [id number] Date of Birth/Sex: Treating RN: [birth year] (86 y.o. Daniel Chapman Primary Care Provider: [name] Other Clinician: Referring Provider: Treating Provider/Extender: Franki Cabot in Treatment: 6 Verbal / Phone Orders: No Diagnosis Coding ICD-10 Coding Code Description N02.725D Laceration without foreign body, right foot, subsequent encounter I70.235 Atherosclerosis of native arteries of right leg with ulceration of other part of foot L03.115 Cellulitis of right lower limb Discharge From [facility] Discharge from [facility] - call if any future wound care needs. Secondary Dressing Foam Border - protect area with padding for couple of weeks for protection. Electronic Signature(s) Signed: 11/14/2019 5:45:21 PM By: Deon Pilling Signed: 11/15/2019 7:45:08 AM By: Linton Ham MD Entered By: Deon Pilling on 11/14/2019 14:55:09 -------------------------------------------------------------------------------- Problem List Details Patient Name: Date of Service: STA Daniel Chapman, [name] 11/14/2019 1:30 PM Medical Record Number: [id number] Patient Account Number: [id number] Date of Birth/Sex: Treating RN: [birth year] (86 y.o. Daniel Chapman Primary Care Provider: [name] Other Clinician: Referring Provider: Treating Provider/Extender: Franki Cabot in Treatment: 6 Active Problems ICD-10 Encounter Code Description Active Date MDM Diagnosis S91.311D Laceration without foreign body, right foot, subsequent encounter 10/03/2019 No Yes I70.235 Atherosclerosis of native  arteries of right leg with ulceration of other part of 10/03/2019 No Yes foot L03.115 Cellulitis of right lower limb 10/03/2019 No Yes Inactive Problems Resolved Problems Electronic Signature(s) Signed: 11/15/2019 7:45:08 AM By: Linton Ham MD Entered By: Linton Ham on 11/14/2019 15:27:26 -------------------------------------------------------------------------------- Progress Note Details Patient Name: Date of Service: [name], [name] 11/14/2019 1:30 PM Medical Record Number: [id number] Patient Account Number: [id number] Date of Birth/Sex: Treating RN: 1931/09/22 (86 y.o. Daniel Chapman Primary Care Provider: [name] Other Clinician: Referring Provider: Treating Provider/Extender: Franki Cabot in Treatment: 6 Subjective History of Present Illness (HPI) ADMISSION 10/03/2019 This is a 86 year old man who traumatized his right foot when he got up in the middle of the night on a night light. This was in the middle of April. Came to attention of his primary physician on 4/26. The area was red and open. He was given a course of cephalexin with some improvement. He was seen yesterday in follow-up again by primary care he was given another round of Keflex although noted that he was considerably better. They have been using Neosporin and a Band-Aid on the wound area. The patient has multi-infarct dementia and is really not able to respond to questions all that much. He does not appear to be in a lot of discomfort. Past medical history chronic A. fib on Eliquis, vascular dementia, hyperlipidemia, continued tobacco abuse, COPD, coronary artery disease and a history of oropharyngeal cancer for which she received radiation in 2012 ABI in our clinic was 0.41 5/18; wound on the right dorsal foot from trauma. He likely has severe PAD. He gets cramps at night according to his wife. We are using silver collagen. He is somewhat disinhibited and loud. It is difficult  to see him getting through noninvasive arterial studies and certainly not an angiogram without significant anesthesia 6/3; patient's wound on the right dorsal  foot which was initially traumatic continues to get smaller. He does not appear to have any pain however he likely has severe PAD. In spite of an ABI in our clinic of 0.41 the wound is actually contracted. Again I had a long conversation about going forward with formal arterial studies in the right leg with somebody with severe dementia likely vascular per my notes. The issue would be whether or not to proceed with an angiogram or given his dementia would we be even able to do an angiogram. I spent some time talking to the patient's wife about this today. I do not think we are under any pressure to make a decision here because the wound is healing and as far as I can tell the patient is relatively asymptomatic 6/17; the patient's wound on the dorsal right foot which was initially trauma has closed. This was in spite of really significant PAD and some apical discussions I had about going forward with formal evaluations. Fortunately this was a traumatic wound and not necessarily a purely ischemic 1. We will watch this over time. Objective Constitutional Sitting or standing Blood Pressure is within target range for patient.. Pulse regular and within target range for patient.Marland Kitchen Respirations regular, non-labored and within target range.. Temperature is normal and within the target range for the patient.Marland Kitchen Appears in no distress. Vitals Time Taken: 2:14 PM, Height: 68 in, Weight: 123 lbs, BMI: 18.7, Temperature: 97.9 F, Pulse: 69 bpm, Respiratory Rate: 18 breaths/min, Blood Pressure: 120/79 mmHg. Cardiovascular Pedal pulses are absent on the right foot is cool. General Notes: Wound exam; small wound on the right dorsal foot is totally closed over. Integumentary (Hair, Skin) Wound #1 status is Open. Original cause of wound was Trauma. The wound  is located on the Right,Dorsal Foot. The wound measures 0cm length x 0cm width x 0cm depth; 0cm^2 area and 0cm^3 volume. There is no tunneling or undermining noted. There is a none present amount of drainage noted. The wound margin is flat and intact. There is no granulation within the wound bed. There is no necrotic tissue within the wound bed. Assessment Active Problems ICD-10 Laceration without foreign body, right foot, subsequent encounter Atherosclerosis of native arteries of right leg with ulceration of other part of foot Cellulitis of right lower limb Plan Discharge From [facility]: Discharge from [facility] - call if any future wound care needs. Secondary Dressing: Foam Border - protect area with padding for couple of weeks for protection. 1. The patient can be discharged from the wound care center 2. I think he has significant PAD hopefully there will not be any more trauma issues that will cause refractory wounds. 3. I did have discussions with the patient's wife given his cognitive impairment. She elected not to go forward with noninvasive vascular or invasive vascular studies at this point. Hopefully in the future this will not be an Doctor, hospital) Signed: 11/15/2019 7:45:08 AM By: Linton Ham MD Entered By: Linton Ham on 11/14/2019 15:30:06 -------------------------------------------------------------------------------- SuperBill Details Patient Name: Date of Service: [name], [name] 11/14/2019 Medical Record Number: [id number] Patient Account Number: [id number] Date of Birth/Sex: Treating RN: August 05, 1931 (86 y.o. Daniel Chapman Primary Care Provider: [name] Other Clinician: Referring Provider: Treating Provider/Extender: Franki Cabot in Treatment: 6 Diagnosis Coding ICD-10 Codes Code Description [id number] Laceration without foreign body, right foot, subsequent encounter I70.235 Atherosclerosis of native  arteries of right leg with ulceration of other part of foot L03.115  Cellulitis of right lower limb Facility Procedures CPT4 Code: [id number] Description: 65784 - WOUND CARE VISIT-LEV 3 EST PT Modifier: Quantity: 1 Physician Procedures : CPT4 Code Description Modifier [id number] - WC PHYS LEVEL 3 - EST PT ICD-10 Diagnosis Description G40.102V Laceration without foreign body, right foot, subsequent encounter I70.235 Atherosclerosis of native arteries of right leg with ulceration of  other part of foot Quantity: 1 Electronic Signature(s) Signed: 11/15/2019 7:45:08 AM By: Linton Ham MD Entered By: Linton Ham on 11/14/2019 15:30:21

## 2020-01-07 ENCOUNTER — Encounter: Payer: Self-pay | Admitting: Cardiology

## 2020-01-07 ENCOUNTER — Ambulatory Visit: Payer: Medicare Other | Admitting: Cardiology

## 2020-01-07 ENCOUNTER — Other Ambulatory Visit: Payer: Self-pay

## 2020-01-07 VITALS — BP 130/80 | HR 67 | Ht 66.5 in | Wt 119.0 lb

## 2020-01-07 DIAGNOSIS — Z7901 Long term (current) use of anticoagulants: Secondary | ICD-10-CM

## 2020-01-07 DIAGNOSIS — Z720 Tobacco use: Secondary | ICD-10-CM | POA: Diagnosis not present

## 2020-01-07 DIAGNOSIS — I482 Chronic atrial fibrillation, unspecified: Secondary | ICD-10-CM | POA: Diagnosis not present

## 2020-01-07 NOTE — Patient Instructions (Signed)

## 2020-01-07 NOTE — Progress Notes (Signed)
Cardiology Office Note:    Date:  01/07/2020   ID:  Daniel Chapman, DOB July 15, 1931, MRN [id number]  PCP:  Johna Roles, PA  Cardiologist:  Candee Furbish, MD  Electrophysiologist:  None   Referring MD: Abner Greenspan, MD   No chief complaint on file. Here for follow-up of atrial fibrillation  History of Present Illness:    Daniel Chapman is a 86 y.o. 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  He went on oral anticoagulant and had DC cardioversion however it returned and he was managed with rate control strategy.  EF 65 to 70%.  Previously statin was decreased because of memory issue.  He has been having a difficult time with his hemorrhoids.  In review of prior note from [facility] [place], [place], he considers himself quite a jokester.  His main complaint today once again is his hemorrhoids.  Otherwise, he is doing well no fevers chills nausea vomiting shortness of breath.  Continues to smoke.  His wife is very patient with him.  LDL 53 on 01/05/2018, hemoglobin A1c 6.2, creatinine 0.8, TSH 1.4  01/07/2020-here for the follow-up of permanent atrial fibrillation.  Doing very well rate controlled.  Dimension noted.  No fevers chills nausea vomiting syncope.  Came down the hallway dancing.    Past Medical History:  Diagnosis Date  . Carcinoma of vocal cord [facility]) 2008   radiation therapy  . Carotid artery occlusion    a. 09/2017 occ RICA, 1-39% L, occ prox LECA followed by [facility].  . Chronic atrial fibrillation ([place])   . COPD (chronic obstructive pulmonary disease) ([place])   . Fall due to ice or snow Feb. 22, 2015   Partial Right Hip replaced  . History of ITP   . Hx of colonic polyps   . Hyperlipemia   . Osteopenia    ?  Marland Kitchen Osteoporosis     dexa 06, declines further dexa or tx   . Smoker   . Stroke [facility])     Past Surgical History:  Procedure Laterality Date  . BONE MARROW BIOPSY  05/2003  . CARDIOVERSION N/A 03/22/2017   Procedure: CARDIOVERSION;  Surgeon: Acie Fredrickson Wonda Cheng, MD;  Location: [place] [facility];  Service: Cardiovascular;  Laterality: N/A;  . CATARACT EXTRACTION    . Dexa     per patient ? osteopenia  . HEMORRHOID SURGERY    . JOINT REPLACEMENT Right Feb. 22, 2015   Partial Hip  . SPLENECTOMY    . TONSILLECTOMY AND ADENOIDECTOMY Bilateral age 40 per patient    Current Medications: Current Meds  Medication Sig  . apixaban (ELIQUIS) 2.5 MG TABS tablet Take 1 tablet (2.5 mg total) by mouth 2 (two) times daily.  . calcium carbonate (TUMS EX) 750 MG chewable tablet Chew 1 tablet by mouth daily. Reported on 09/15/2015  . cephALEXin (KEFLEX) 500 MG capsule Take 1 capsule (500 mg total) by mouth 3 (three) times daily.  . cholecalciferol (VITAMIN D) 1000 UNITS tablet Take 1,000 Units by mouth daily. Reported on 09/15/2015  . donepezil (ARICEPT) 10 MG tablet Take 1 tablet (10 mg total) by mouth at bedtime.  . memantine (NAMENDA) 10 MG tablet Take 1 tablet (10 mg total) by mouth 2 (two) times daily.  . rosuvastatin (CRESTOR) 5 MG tablet TAKE 1 TABLET  BY MOUTH 3  TIMES A WEEK. TAKE ON  MONDAY, [place], AND  FRIDAYS.     Allergies:   Patient has no known allergies.   Social History   Socioeconomic History  . Marital status: Married    Spouse name: Not on file  . Number of children: 6  . Years of education: Not on file  . Highest education level: Not on file  Occupational History  . Occupation: Biochemist, clinical: SELF-EMPLOYED  Tobacco Use  . Smoking status: Current Every Day Smoker    Packs/day: 0.50    Types: Cigarettes  . Smokeless tobacco: Never Used  Vaping Use  . Vaping Use: Never used  Substance and Sexual Activity  . Alcohol use: No    Alcohol/week: 0.0 standard drinks  . Drug use: No  .  Sexual activity: Not Currently    Partners: Female  Other Topics Concern  . Not on file  Social History Narrative   Lives with wife      Left handed      2 years of college      [employer]      Social Determinants of Health   Financial Resource Strain: Low Risk   . Difficulty of Paying Living Expenses: Not hard at all  Food Insecurity: No Food Insecurity  . Worried About Charity fundraiser in the Last Year: Never true  . Ran Out of Food in the Last Year: Never true  Transportation Needs: No Transportation Needs  . Lack of Transportation (Medical): No  . Lack of Transportation (Non-Medical): No  Physical Activity: Inactive  . Days of Exercise per Week: 0 days  . Minutes of Exercise per Session: 0 min  Stress: No Stress Concern Present  . Feeling of Stress : Not at all  Social Connections:   . Frequency of Communication with Friends and Family:   . Frequency of Social Gatherings with Friends and Family:   . Attends Religious Services:   . Active Member of Clubs or Organizations:   . Attends Archivist Meetings:   Marland Kitchen Marital Status:      Family History: The patient's family history includes Heart disease in his sister; Uterine cancer in his mother. There is no history of Colon cancer, Stomach cancer, Pancreatic cancer, or Esophageal cancer.  ROS:   Please see the history of present illness.     All other systems reviewed and are negative.  EKGs/Labs/Other Studies Reviewed:    The following studies were reviewed today:  Echocardiogram 02/06/2017:  - Left ventricle: The cavity size was normal. Wall thickness was   normal. Systolic function was vigorous. The estimated ejection   fraction was in the range of 65% to 70%. - Aortic valve: Moderately calcified annulus. Mildly thickened   leaflets. - Mitral valve: There was mild regurgitation.  EKG: Prior EKG shows A. fib 70 personally reviewed  Recent Labs: 01/11/2019: ALT 12; BUN 19; Creatinine, Ser 0.84;  Hemoglobin 13.6; Platelets 193.0; Potassium 4.5; Sodium 138; TSH 1.10  Recent Lipid Panel    Component Value Date/Time   CHOL 118 01/11/2019 0908   CHOL 108 04/10/2017 0811   TRIG 113.0 01/11/2019 0908   HDL 34.40 (L) 01/11/2019 0908   HDL 32 (L) 04/10/2017 0811   CHOLHDL 3 01/11/2019 0908   VLDL 22.6 01/11/2019 0908   LDLCALC 61 01/11/2019 0908   LDLCALC 57 04/10/2017 0811    Physical Exam:    VS:  BP 130/80   Pulse  67   Ht 5' 6.5" (1.689 m)   Wt 119 lb (54 kg)   SpO2 96%   BMI 18.92 kg/m     Wt Readings from Last 3 Encounters:  01/07/20 119 lb (54 kg)  10/02/19 123 lb 14.4 oz (56.2 kg)  09/23/19 125 lb 4 oz (56.8 kg)     GEN: Thin, in no acute distress  HEENT: normal  Neck: no JVD, carotid bruits, or masses Cardiac: RRR; no murmurs, rubs, or gallops,no edema  Respiratory:  clear to auscultation bilaterally, normal work of breathing GI: soft, nontender, nondistended, + BS MS: no deformity or atrophy  Skin: warm and dry, no rash Neuro:  Alert and Oriented x 3, Strength and sensation are intact Psych: euthymic mood, full affect   ASSESSMENT:    1. Chronic a-fib ([place])   2. Tobacco abuse   3. Chronic anticoagulation    PLAN:    In order of problems listed above:  Permanent atrial fibrillation -Continue with rate control without any AV nodal blocking agents.  No changes made.  Chronic anticoagulation - Eliquis.  No changes for now.  No bleeding.  Obviously if dementia progresses and gets worse, we could consider discontinuation of Eliquis especially if falls start to recur.  Hyperlipidemia -LDL 53 excellent followed by Dr. Glori Bickers.  Currently on Crestor 5 mg.  Go ahead and continue  Tobacco use -No desire to quit.  No changes  Dementia - Seems to be progressive.   Hemorrhoids -He was able to have a televisit with GI.  Gave him some advice on his hemorrhoids.     Medication Adjustments/Labs and Tests Ordered: Current medicines are reviewed at length  with the patient today.  Concerns regarding medicines are outlined above.  Orders Placed This Encounter  Procedures  . EKG 12-Lead   No orders of the defined types were placed in this encounter.   Patient Instructions  Medication Instructions:  The current medical regimen is effective;  continue present plan and medications.  *If you need a refill on your cardiac medications before your next appointment, please call your pharmacy*  Follow-Up: At [facility] you and your health needs are our priority.  As part of our continuing mission to provide you with exceptional heart care, we have created designated Provider Care Teams.  These Care Teams include your primary Cardiologist (physician) and Advanced Practice Providers (APPs -  Physician Assistants and Nurse Practitioners) who all work together to provide you with the care you need, when you need it.  We recommend signing up for the patient portal called "MyChart".  Sign up information is provided on this After Visit Summary.  MyChart is used to connect with patients for Virtual Visits (Telemedicine).  Patients are able to view lab/test results, encounter notes, upcoming appointments, etc.  Non-urgent messages can be sent to your provider as well.   To learn more about what you can do with MyChart, go to [email or web address].    Your next appointment:   12 month(s)  The format for your next appointment:   In Person  Provider:   Candee Furbish, MD         Signed, Candee Furbish, MD  01/07/2020 3:52 PM    [place]

## 2020-01-23 DIAGNOSIS — Z0000 Encounter for general adult medical examination without abnormal findings: Secondary | ICD-10-CM | POA: Diagnosis not present

## 2020-01-23 DIAGNOSIS — I4819 Other persistent atrial fibrillation: Secondary | ICD-10-CM | POA: Diagnosis not present

## 2020-01-23 DIAGNOSIS — Z23 Encounter for immunization: Secondary | ICD-10-CM | POA: Diagnosis not present

## 2020-01-23 DIAGNOSIS — I714 Abdominal aortic aneurysm, without rupture: Secondary | ICD-10-CM | POA: Diagnosis not present

## 2020-02-18 ENCOUNTER — Ambulatory Visit
Admission: RE | Admit: 2020-02-18 | Discharge: 2020-02-18 | Disposition: A | Discharge status: Home / Self Care | Payer: Medicare Other | Source: Ambulatory Visit | Attending: Physician Assistant | Admitting: Physician Assistant

## 2020-02-18 ENCOUNTER — Other Ambulatory Visit: Payer: Self-pay | Admitting: Physician Assistant

## 2020-02-18 DIAGNOSIS — M25551 Pain in right hip: Secondary | ICD-10-CM | POA: Diagnosis not present

## 2020-02-18 DIAGNOSIS — Z23 Encounter for immunization: Secondary | ICD-10-CM | POA: Diagnosis not present

## 2020-02-18 DIAGNOSIS — M1612 Unilateral primary osteoarthritis, left hip: Secondary | ICD-10-CM | POA: Diagnosis not present

## 2020-03-08 ENCOUNTER — Other Ambulatory Visit: Payer: Self-pay | Admitting: Cardiology

## 2020-03-09 NOTE — Telephone Encounter (Signed)
Pt last saw Dr Marlou Porch 01/07/20, last labs 01/23/20 Creat 0.76 at [facility] per [place], age 86, weight 54kg, based on specified criteria pt is on appropriate dosage of Eliquis 2.5mg  BID.  Will refill rx.

## 2020-03-16 DIAGNOSIS — D485 Neoplasm of uncertain behavior of skin: Secondary | ICD-10-CM | POA: Diagnosis not present

## 2020-03-16 DIAGNOSIS — L570 Actinic keratosis: Secondary | ICD-10-CM | POA: Diagnosis not present

## 2020-03-16 DIAGNOSIS — L989 Disorder of the skin and subcutaneous tissue, unspecified: Secondary | ICD-10-CM | POA: Diagnosis not present

## 2020-04-21 ENCOUNTER — Telehealth: Payer: Medicare Other

## 2020-05-11 ENCOUNTER — Other Ambulatory Visit: Payer: Self-pay

## 2020-05-11 ENCOUNTER — Encounter: Payer: Self-pay | Admitting: Neurology

## 2020-05-11 ENCOUNTER — Ambulatory Visit: Payer: Medicare Other | Admitting: Neurology

## 2020-05-11 VITALS — BP 92/60 | HR 65 | Resp 18 | Ht 67.0 in | Wt 126.0 lb

## 2020-05-11 DIAGNOSIS — F0150 Vascular dementia without behavioral disturbance: Secondary | ICD-10-CM

## 2020-05-11 MED ORDER — DONEPEZIL HCL 10 MG PO TABS: 10.0000 mg | ORAL_TABLET | Freq: Every day | ORAL | 3 refills | Status: DC

## 2020-05-11 MED ORDER — MEMANTINE HCL 10 MG PO TABS: 10.0000 mg | ORAL_TABLET | Freq: Two times a day (BID) | ORAL | 3 refills | Status: DC

## 2020-05-11 NOTE — Patient Instructions (Signed)
1. Continue all your medications  2. No further driving  3. Continue close supervision  4. Follow-up in 6-8 months, call for any changes   FALL PRECAUTIONS: Be cautious when walking. Scan the area for obstacles that may increase the risk of trips and falls. When getting up in the mornings, sit up at the edge of the bed for a few minutes before getting out of bed. Consider elevating the bed at the head end to avoid drop of blood pressure when getting up. Walk always in a well-lit room (use night lights in the walls). Avoid area rugs or power cords from appliances in the middle of the walkways. Use a walker or a cane if necessary and consider physical therapy for balance exercise. Get your eyesight checked regularly.  HOME SAFETY: Consider the safety of the kitchen when operating appliances like stoves, microwave oven, and blender. Consider having supervision and share cooking responsibilities until no longer able to participate in those. Accidents with firearms and other hazards in the house should be identified and addressed as well.  ABILITY TO BE LEFT ALONE: If patient is unable to contact 911 operator, consider using LifeLine, or when the need is there, arrange for someone to stay with patients. Smoking is a fire hazard, consider supervision or cessation. Risk of wandering should be assessed by caregiver and if detected at any point, supervision and safe proof recommendations should be instituted.  RECOMMENDATIONS FOR ALL PATIENTS WITH MEMORY PROBLEMS: 1. Continue to exercise (Recommend 30 minutes of walking everyday, or 3 hours every week) 2. Increase social interactions - continue going to [place] and enjoy social gatherings with friends and family 3. Eat healthy, avoid fried foods and eat more fruits and vegetables 4. Maintain adequate blood pressure, blood sugar, and blood cholesterol level. Reducing the risk of stroke and cardiovascular disease also helps promoting better memory. 5. Avoid  stressful situations. Live a simple life and avoid aggravations. Organize your time and prepare for the next day in anticipation. 6. Sleep well, avoid any interruptions of sleep and avoid any distractions in the bedroom that may interfere with adequate sleep quality 7. Avoid sugar, avoid sweets as there is a strong link between excessive sugar intake, diabetes, and cognitive impairment The Mediterranean diet has been shown to help patients reduce the risk of progressive memory disorders and reduces cardiovascular risk. This includes eating fish, eat fruits and green leafy vegetables, nuts like almonds and hazelnuts, walnuts, and also use olive oil. Avoid fast foods and fried foods as much as possible. Avoid sweets and sugar as sugar use has been linked to worsening of memory function.  There is always a concern of gradual progression of memory problems. If this is the case, then we may need to adjust level of care according to patient needs. Support, both to the patient and caregiver, should then be put into place.

## 2020-05-11 NOTE — Progress Notes (Signed)
NEUROLOGY FOLLOW UP OFFICE NOTE  Daniel Chapman [id number] [birth year]  HISTORY OF PRESENT ILLNESS: I had the pleasure of seeing Daniel Chapman in follow-up in the neurology clinic on 05/11/2020.  The patient was last seen 8 months ago for dementia. He is again accompanied by his wife who helps supplement the history today.  Records and images were personally reviewed where available.  MMSE 18/30 in 01/2019. He is on Donepezil 10mg  daily and Memantine 10mg  BID without side effects. Since his last visit, his wife reports continued progression with more confusion. He got lost driving a week ago. She was letting him drive only in their area, he was going to get a tree but [place]. She had to call the sheriff, he called home and she picked him up 4 hours later. His wife manages finances and medications. She has to nag him to bathe, but he is able to bathe and dress himself. She denies any personality changes, paranoia or hallucinations. He does not usually get upset unless she repeatedly nags him. She has a hard time getting him to eat, he never wants to eat a balanced meal and sometimes does not want to eat. He denies any headaches, dizziness, focal numbness/tingling/weakness. He makes jokes several times during the visit, he is hard of hearing and it is unclear if jokes are made to cover up for this. His wife reports a fall last October when he went to the bathroom, he knocked off the closet doors.    History on Initial Assessment 02/17/2019: This is a pleasant 86 year old right-handed man with a history of hyperlipidemia, vocal cord carcinoma s/p radiation, right carotid artery occlusion, right cerebellar stroke, presenting for evaluation of memory loss. He is very jovial and makes several jokes during the visit, his wife reports this is his baseline, "his goal in life is to make people laugh." His wife noticed memory changes since his stroke in 2018, but over the past few weeks, she  feels it had suddenly gotten worse. She states it is not so much memory as it is being able to comprehend what he is being told. She gives some examples, he took the dog out and forget about their electric fence, he said he could not get the dog in the car, she had to tell him 4 times that he had to move across the fence. She would have to tell things repeatedly and he has difficulty hearing, which irritates him. 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 He stopped doing it 6 months ago. His wife thinks he got lost one time in an unfamiliar road, he was fine with familiar roads. His wife sets out his medications and he remembers to take them. His wife manages finances. She has noticed he has been more irritable the past few months, no paranoia or hallucinations. He reports his mood is happy. He states "I'm a little crazy." He denies any family history of dementia, his wife reminds him his sister and father had Alzheimer's disease.No history of significant head injuries, no alcohol use. He smokes 1/2 PPD, his wife thinks it is more than that.   He denies any headaches, dizziness, diplopia, dysarthria, dysphagia, neck/back pain, focal numbness/tingling/weakness, bowel/bladder dysfunction. No anosmia, tremors, no falls. He has no physical residual deficits from the stroke, he felt dizzy and fell down, no  focal weakness. He reports sleep is good. His wife states he sleeps too much and takes several naps during the day. He does not snore. His wife states he walks so slow, he used to be "full blast." He is taking Donepezil 10mg  daily without side effects, he has a prescription for uptitration of Memantine which he has not started yet.  I personally reviewed MRI brain in 01/2017 which showed a late acute/early subacute infarct in the right inferior cerebellum, mild chronic microvascular disease and diffuse  volume loss. Right ICA occlusion with reconstiation at the paraclinoid segment.  MRI brain without contrast done 02/2019 did not show any acute changes, there is moderate diffuse atrophy and chronic microvascular disease, remote right PICA territory infarct, chronic occlusion of right ICA  PAST MEDICAL HISTORY: Past Medical History:  Diagnosis Date   Carcinoma of vocal cord ([place]) 2008   radiation therapy   Carotid artery occlusion    a. 09/2017 occ RICA, 1-39% L, occ prox LECA followed by [facility].   Chronic atrial fibrillation (HCC)    COPD (chronic obstructive pulmonary disease) ([place])    Fall due to ice or snow Feb. 22, 2015   Partial Right Hip replaced   History of ITP    Hx of colonic polyps    Hyperlipemia    Osteopenia    ?   Osteoporosis    dexa 06, declines further dexa or tx    Smoker    Stroke [facility])     MEDICATIONS: Current Outpatient Medications on File Prior to Visit  Medication Sig Dispense Refill   calcium carbonate (TUMS EX) 750 MG chewable tablet Chew 1 tablet by mouth daily. Reported on 09/15/2015     cephALEXin (KEFLEX) 500 MG capsule Take 1 capsule (500 mg total) by mouth 3 (three) times daily. 15 capsule 0   cholecalciferol (VITAMIN D) 1000 UNITS tablet Take 1,000 Units by mouth daily. Reported on 09/15/2015     donepezil (ARICEPT) 10 MG tablet Take 1 tablet (10 mg total) by mouth at bedtime. 90 tablet 3   ELIQUIS 2.5 MG TABS tablet TAKE 1 TABLET BY MOUTH  TWICE DAILY 180 tablet 1   memantine (NAMENDA) 10 MG tablet Take 1 tablet (10 mg total) by mouth 2 (two) times daily. 180 tablet 3   rosuvastatin (CRESTOR) 5 MG tablet TAKE 1 TABLET BY MOUTH 3  TIMES A WEEK. TAKE ON  MONDAY, [place], AND  FRIDAYS. 45 tablet 2   No current facility-administered medications on file prior to visit.    ALLERGIES: No Known Allergies  FAMILY HISTORY: Family History  Problem Relation Age of Onset   Uterine cancer Mother    Heart disease Sister        some  heart trouble   Colon cancer Neg Hx    Stomach cancer Neg Hx    Pancreatic cancer Neg Hx    Esophageal cancer Neg Hx     SOCIAL HISTORY: Social History   Socioeconomic History   Marital status: Married    Spouse name: Not on file   Number of children: 6   Years of education: Not on file   Highest education level: Not on file  Occupational History   Occupation: Biochemist, clinical: SELF-EMPLOYED  Tobacco Use   Smoking status: Current Every Day Smoker    Packs/day: 0.50    Types: Cigarettes   Smokeless tobacco: Never Used  Vaping Use   Vaping Use: Never used  Substance and Sexual Activity  Alcohol use: No    Alcohol/week: 0.0 standard drinks   Drug use: No   Sexual activity: Not Currently    Partners: Female  Other Topics Concern   Not on file  Social History Narrative   Lives with wife      Left handed      2 years of college      [employer]      Social Determinants of Health   Financial Resource Strain: Low Risk    Difficulty of Paying Living Expenses: Not hard at all  Food Insecurity: Not on file  Transportation Needs: Not on file  Physical Activity: Not on file  Stress: Not on file  Social Connections: Not on file  Intimate Partner Violence: Not on file     PHYSICAL EXAM: Vitals:   05/11/20 1545  BP: 92/60  Pulse: 65  Resp: 18  SpO2: 95%   General: No acute distress Head:  Normocephalic/atraumatic Skin/Extremities: No rash, no edema Neurological Exam: alert and oriented to person, day of week, state. He says year is 51/53, city is [place]. His wife reports he is making jokes when saying these. Fund of knowledge is reduced.  Recent and remote memory are impaired. Attention and concentration are reduced. MMSE 11/30.  MMSE - Mini Mental State Exam 05/11/2020 03/01/2019 01/11/2019  Not completed: - - Refused  Orientation to time 1 5 -  Orientation to Place 1 4 -  Registration 3 0 -  Attention/ Calculation 0 2 -  Recall 1 2 -   Language- name 2 objects 2 1 -  Language- repeat 0 0 -  Language- follow 3 step command 2 2 -  Language- read & follow direction 1 1 -  Write a sentence 0 1 -  Copy design 0 0 -  Total score 11 18 -   Cranial nerves: Pupils equal, round. Extraocular movements intact with no nystagmus. No facial asymmetry.  Motor: moves all extremities symmetrically. Gait narrow-based and steady, no ataxia   IMPRESSION: This is a pleasant 86 yo RH man with a history of hyperlipidemia, vocal cord carcinoma s/p radiation, right carotid artery occlusion, right cerebellar stroke, with likely vascular dementia without behavioral disturbance. MMSE today 11/30 (18/30 in 01/2019). His wife reports continued decline, we discussed diagnosis and prognosis in dementia. Continue Donepezil 10mg  daily and Memantine 10mg  BID. His wife denies any behavioral changes. Discussed no further driving. Continue close supervision. Follow-up in 6-8 months, they know to call for any changes.   Thank you for allowing me to participate in his care.  Please do not hesitate to call for any questions or concerns.   Ellouise Newer, M.D.   CC: Thayer Ohm, [place]

## 2020-05-14 ENCOUNTER — Other Ambulatory Visit: Payer: Self-pay

## 2020-05-14 ENCOUNTER — Telehealth: Payer: Self-pay | Admitting: Neurology

## 2020-05-14 MED ORDER — DONEPEZIL HCL 10 MG PO TABS: 10.0000 mg | ORAL_TABLET | Freq: Every day | ORAL | 3 refills | Status: DC

## 2020-05-14 NOTE — Telephone Encounter (Signed)
Patient's wife is requesting for patient's medication Donepezil be sent to [facility].

## 2020-05-14 NOTE — Telephone Encounter (Signed)
Medication [facility]

## 2020-05-21 DIAGNOSIS — Z20822 Contact with and (suspected) exposure to covid-19: Secondary | ICD-10-CM | POA: Diagnosis not present

## 2020-05-25 ENCOUNTER — Other Ambulatory Visit: Payer: Self-pay | Admitting: Cardiology

## 2020-06-25 ENCOUNTER — Telehealth: Payer: Self-pay

## 2020-06-25 NOTE — Chronic Care Management (AMB) (Signed)
Attempted to reschedule patient for CCM visit. Appears patient has changed primary care providers and is no longer eligable for CCM services through [facility].  Pharmacist Review  Debbora Dus, CPP notified  Margaretmary Dys, [place] Assistant [phone]

## 2020-07-22 DIAGNOSIS — F17200 Nicotine dependence, unspecified, uncomplicated: Secondary | ICD-10-CM | POA: Diagnosis not present

## 2020-07-22 DIAGNOSIS — J449 Chronic obstructive pulmonary disease, unspecified: Secondary | ICD-10-CM | POA: Diagnosis not present

## 2020-07-22 DIAGNOSIS — I4819 Other persistent atrial fibrillation: Secondary | ICD-10-CM | POA: Diagnosis not present

## 2020-07-22 DIAGNOSIS — I739 Peripheral vascular disease, unspecified: Secondary | ICD-10-CM | POA: Diagnosis not present

## 2020-09-19 ENCOUNTER — Other Ambulatory Visit: Payer: Self-pay | Admitting: Cardiology

## 2020-09-21 NOTE — Telephone Encounter (Signed)
Pt's age 87, wt 57.2 kg, SCr 0.76, CrCl 56.45, last ov w/ MS 01/07/20.

## 2020-10-25 ENCOUNTER — Other Ambulatory Visit: Payer: Self-pay | Admitting: Neurology

## 2020-11-10 DIAGNOSIS — H43811 Vitreous degeneration, right eye: Secondary | ICD-10-CM | POA: Diagnosis not present

## 2020-12-31 ENCOUNTER — Other Ambulatory Visit: Payer: Self-pay | Admitting: Neurology

## 2021-01-03 NOTE — Progress Notes (Signed)
Cardiology Office Note    Date:  01/04/2021   ID:  Daniel Chapman, DOB July 13, 1931, MRN [id number]  PCP:  Delma Officer, PA  Cardiologist:  Donato Schultz, MD  Electrophysiologist:  None   Chief Complaint: f/u atrial fib  History of Present Illness:   Daniel Chapman is a 87 y.o. male with history of chronic atrial fibrillation, HLD, chronic dyspnea from COPD/ongoing tobacco use, vocal cord cancer, ITP (S/p splenectomy), pulm nodules, CVA, carotid artery disease (occluded RICA, 1-39% L, occluded prox LECA previously followed by [facility]), dementia (follows with neurology) who presents for overdue follow-up. He was diagnosed with atrial fib in 01/2017 after admission for stroke. He was placed on OAC and underwent DCCV 4 weeks later. However, atrial fib has since recurred and managed with rate-control strategy. 2D echo 01/2017 showed EF 65-70%, mild MR. Statin was previously decreased due to memory issues. He is now seeing neurology for dementia. In previous visits he was quite jovial, considering himself a jokester, dancing down the hallway into visits. He wore a monitor for bradycardia 05/2019 showed AF 100% of the time, occasional PVCs, no significant pauses, minimum HR 53bpm.  He is seen back in follow-up today and is much more reserved during the conversation except if you ask him specific questions, when he answers with jokes. ("How do you feel?" "With my hands!") His wife of 68 years accompanies him today. She says she has a hard time getting him to eat regularly and he doesn't talk much during the day. He spends most of his time sleeping. He no longer drives. He still smokes - no desire to quit.Loney Laurence independently reviewed: KPN 12/2019 LDL 57, trig 260, Hgb 13.4, Cr 0.76, K 4.2, ALT 9 2020 CBC, CMET OK, LDL 61, TSH wnl  Past Medical History:  Diagnosis Date   Carcinoma of vocal cord (HCC) 2008   radiation therapy   Carotid artery occlusion    a. 09/2017 occ RICA, 1-39% L, occ prox LECA  followed by [facility].   Chronic atrial fibrillation (HCC)    COPD (chronic obstructive pulmonary disease) (HCC)    Dementia (HCC)    Fall due to ice or snow 07/21/2013   Partial Right Hip replaced   History of ITP    Hx of colonic polyps    Hyperlipemia    Osteopenia    ?   Osteoporosis    dexa 06, declines further dexa or tx    Smoker    Stroke [facility])     Past Surgical History:  Procedure Laterality Date   BONE MARROW BIOPSY  05/2003   CARDIOVERSION N/A 03/22/2017   Procedure: CARDIOVERSION;  Surgeon: Elease Hashimoto Deloris Ping, MD;  Location: [facility] ENDOSCOPY;  Service: Cardiovascular;  Laterality: N/A;   CATARACT EXTRACTION     Dexa     per patient ? osteopenia   HEMORRHOID SURGERY     JOINT REPLACEMENT Right Feb. 22, 2015   Partial Hip   SPLENECTOMY     TONSILLECTOMY AND ADENOIDECTOMY Bilateral age 13 per patient    Current Medications: Current Meds  Medication Sig   calcium carbonate (TUMS EX) 750 MG chewable tablet Chew 1 tablet by mouth daily. Reported on 09/15/2015   cholecalciferol (VITAMIN D) 1000 UNITS tablet Take 1,000 Units by mouth daily. Reported on 09/15/2015   donepezil (ARICEPT) 10 MG tablet Take 1 tablet (10 mg total) by mouth at bedtime.   ELIQUIS 2.5 MG TABS tablet TAKE 1 TABLET BY MOUTH  TWICE DAILY   memantine (NAMENDA) 10 MG tablet Take 1 tablet (10 mg total) by mouth 2 (two) times daily.   rosuvastatin (CRESTOR) 5 MG tablet TAKE 1 TABLET BY MOUTH 3  TIMES WEEKLY ON MONDAY,  WEDNESDAY, AND FRIDAY   [DISCONTINUED] memantine (NAMENDA) 10 MG tablet Take 1 tablet (10 mg total) by mouth 2 (two) times daily.      Allergies:   Patient has no known allergies.   Social History   Socioeconomic History   Marital status: Married    Spouse name: Not on file   Number of children: 6   Years of education: Not on file   Highest education level: Not on file  Occupational History   Occupation: Land: SELF-EMPLOYED  Tobacco Use   Smoking status: Every Day     Packs/day: 0.50    Types: Cigarettes   Smokeless tobacco: Never  Vaping Use   Vaping Use: Never used  Substance and Sexual Activity   Alcohol use: No    Alcohol/week: 0.0 standard drinks   Drug use: No   Sexual activity: Not Currently    Partners: Female  Other Topics Concern   Not on file  Social History Narrative   Lives with wife      Left handed      2 years of college      [employer]      Social Determinants of Health   Financial Resource Strain: Not on file  Food Insecurity: Not on file  Transportation Needs: Not on file  Physical Activity: Not on file  Stress: Not on file  Social Connections: Not on file     Family History:  The patient's family history includes Heart disease in his sister; Uterine cancer in his mother. There is no history of Colon cancer, Stomach cancer, Pancreatic cancer, or Esophageal cancer.  ROS:   Please see the history of present illness.  All other systems are reviewed and otherwise negative.    EKGs/Labs/Other Studies Reviewed:    Studies reviewed are outlined and summarized above. Reports included below if pertinent.  2D echo 12/2016 - Left ventricle: The cavity size was normal. Wall thickness was    normal. Systolic function was vigorous. The estimated ejection    fraction was in the range of 65% to 70%.  - Aortic valve: Moderately calcified annulus. Mildly thickened    leaflets.  - Mitral valve: There was mild regurgitation.   Monitor 05/2019 Persistent atrial fibrillation 100% of the time with average heart rate of 83 bpm. No significant pauses. Occasional tachycardia as high as 149 bpm. Transient. Occasional PVCs as well. Minimum heart rate 53 bpm-benign.   Overall reassuring monitor with no evidence of significant bradycardia or pauses. Donato Schultz, MD      EKG:  EKG is ordered today, personally reviewed, demonstrating atrial fib 79bpm, nonspecific STTW changes  Recent Labs: No results found for requested labs  within last 8760 hours.  Recent Lipid Panel    Component Value Date/Time   CHOL 118 01/11/2019 0908   CHOL 108 04/10/2017 0811   TRIG 113.0 01/11/2019 0908   HDL 34.40 (L) 01/11/2019 0908   HDL 32 (L) 04/10/2017 0811   CHOLHDL 3 01/11/2019 0908   VLDL 22.6 01/11/2019 0908   LDLCALC 61 01/11/2019 0908   LDLCALC 57 04/10/2017 0811    PHYSICAL EXAM:    VS:  BP 108/62   Pulse 79   Ht 5\' 7"  (1.702 m)  Wt 121 lb 6.4 oz (55.1 kg)   SpO2 97%   BMI 19.01 kg/m   BMI: Body mass index is 19.01 kg/m.  GEN: Well nourished, well developed male in no acute distress HEENT: normocephalic, atraumatic Neck: no JVD, carotid bruits, or masses Cardiac: irregularly irregular, rate controlled; no murmurs, rubs, or gallops, no edema  Respiratory: clear to auscultation bilaterally, normal work of breathing GI: soft, nontender, nondistended, + BS MS: no deformity or atrophy Skin: warm and dry, no rash Neuro:  Alert and Oriented to self, wife, but answers many questions with jokes, says he's in [place] (won't answer seriously), Strength and sensation are intact, follows commands Psych: euthymic mood, full affect  Wt Readings from Last 3 Encounters:  01/04/21 121 lb 6.4 oz (55.1 kg)  05/11/20 126 lb (57.2 kg)  01/07/20 119 lb (54 kg)     ASSESSMENT & PLAN:   1. Chronic atrial fibrillation - rate controlled, not requiring any AVN blocking agents. Continue Eliquis at present dose (for age and weight <60kg). He has not had any falls. We discussed use of assistive aid (I.e. cane) if they felt he would benefit from this but his wife does not think he presently needs one. We should continue to follow on a 6 month basis to be able to continually re-evaluate [facility] candidacy at each OV. Check surveillance labs today (getting CBC, also CMET in place of BMET for lipid management).  2. Hyperlipidemia - tolerating statin. Check lipid profile, CMET today.  3. Carotid artery disease - overdue for [facility] follow-up but  given his progressive decline his wife is disinclined to continue follow-up for this in the meantime. I think this is reasonable as I do not think he would be a good candidate for further procedures.  4. Tobacco abuse - not interested in quitting. We discussed safety measures of smoke detectors given his memory issues - his wife makes him smoke outside.  5. Vascular dementia - followed by neurology. As above, continue to re-evaluate candidacy for anticoagulation as this situation evolves. His wife seems to be doing a great job caring for him and she says they also have family nearby.  6. Mild mitral regurgitation - continue clinical surveillance. No indication to repeat echocardiogram at present time.  Disposition: F/u with Dr. Marlou Porch in 6 months.   Medication Adjustments/Labs and Tests Ordered: Current medicines are reviewed at length with the patient today.  Concerns regarding medicines are outlined above. Medication changes, Labs and Tests ordered today are summarized above and listed in the Patient Instructions accessible in Encounters.   Signed, Charlie Pitter, PA-C  01/04/2021 4:17 PM    [facility] [address], [place], [place]  [address] Phone: [phone]; Fax: [phone]

## 2021-01-04 ENCOUNTER — Other Ambulatory Visit: Payer: Self-pay

## 2021-01-04 ENCOUNTER — Other Ambulatory Visit: Payer: Self-pay | Admitting: Neurology

## 2021-01-04 ENCOUNTER — Telehealth: Payer: Self-pay | Admitting: Neurology

## 2021-01-04 ENCOUNTER — Encounter: Payer: Self-pay | Admitting: Physician Assistant

## 2021-01-04 ENCOUNTER — Ambulatory Visit: Payer: Medicare Other | Admitting: Physician Assistant

## 2021-01-04 VITALS — BP 108/62 | HR 79 | Ht 67.0 in | Wt 121.4 lb

## 2021-01-04 DIAGNOSIS — F0150 Vascular dementia without behavioral disturbance: Secondary | ICD-10-CM

## 2021-01-04 DIAGNOSIS — E785 Hyperlipidemia, unspecified: Secondary | ICD-10-CM | POA: Diagnosis not present

## 2021-01-04 DIAGNOSIS — Z720 Tobacco use: Secondary | ICD-10-CM | POA: Diagnosis not present

## 2021-01-04 DIAGNOSIS — I482 Chronic atrial fibrillation, unspecified: Secondary | ICD-10-CM | POA: Diagnosis not present

## 2021-01-04 DIAGNOSIS — I6523 Occlusion and stenosis of bilateral carotid arteries: Secondary | ICD-10-CM | POA: Diagnosis not present

## 2021-01-04 DIAGNOSIS — I340 Nonrheumatic mitral (valve) insufficiency: Secondary | ICD-10-CM

## 2021-01-04 MED ORDER — MEMANTINE HCL 10 MG PO TABS: 10.0000 mg | ORAL_TABLET | Freq: Two times a day (BID) | ORAL | 0 refills | Status: DC

## 2021-01-04 NOTE — Telephone Encounter (Signed)
Sent rx for 30 day supply for memantine.

## 2021-01-04 NOTE — Telephone Encounter (Signed)
Pt's wife called in stating they received a denial for the prescription refill and would like to find out why and what the pt should do since [facility] is out of stock.

## 2021-01-04 NOTE — Telephone Encounter (Signed)
I sent Rx to pharmacy for memantine for 30 day supply.

## 2021-01-04 NOTE — Patient Instructions (Signed)
Medication Instructions:  Your physician recommends that you continue on your current medications as directed. Please refer to the Current Medication list given to you today.   *If you need a refill on your cardiac medications before your next appointment, please call your pharmacy*   Lab Work: TODAY:  CMET, LIPID, & CBC If you have labs (blood work) drawn today and your tests are completely normal, you will receive your results only by: [place] (if you have MyChart) OR A paper copy in the mail If you have any lab test that is abnormal or we need to change your treatment, we will call you to review the results.   Testing/Procedures: None ordered     Follow-Up: At [facility], you and your health needs are our priority.  As part of our continuing mission to provide you with exceptional heart care, we have created designated Provider Care Teams.  These Care Teams include your primary Cardiologist (physician) and Advanced Practice Providers (APPs -  Physician Assistants and Nurse Practitioners) who all work together to provide you with the care you need, when you need it.  We recommend signing up for the patient portal called "MyChart".  Sign up information is provided on this After Visit Summary.  MyChart is used to connect with patients for Virtual Visits (Telemedicine).  Patients are able to view lab/test results, encounter notes, upcoming appointments, etc.  Non-urgent messages can be sent to your provider as well.   To learn more about what you can do with MyChart, go to [email or web address].    Your next appointment:    month(s)6  The format for your next appointment:   In Person  Provider:   You may see Candee Furbish, MD  or one of the following Advanced Practice Providers on your designated Care Team:   Cecilie Kicks, NP     Other Instructions

## 2021-01-04 NOTE — Telephone Encounter (Signed)
Pt's wife called in stating [facility] mail order was out of stock of the Memantine. She is wanting to see if they could get a month's supply at [facility] on [address]?

## 2021-01-05 ENCOUNTER — Telehealth: Payer: Self-pay | Admitting: Cardiology

## 2021-01-05 LAB — CBC
Hematocrit: 36.1 % — ABNORMAL LOW (ref 37.5–51.0)
Hemoglobin: 12.7 g/dL — ABNORMAL LOW (ref 13.0–17.7)
MCH: 35.5 pg — ABNORMAL HIGH (ref 26.6–33.0)
MCHC: 35.2 g/dL (ref 31.5–35.7)
MCV: 101 fL — ABNORMAL HIGH (ref 79–97)
NRBC: 1 % — ABNORMAL HIGH (ref 0–0)
Platelets: 168 10*3/uL (ref 150–450)
RBC: 3.58 x10E6/uL — ABNORMAL LOW (ref 4.14–5.80)
RDW: 18.2 % — ABNORMAL HIGH (ref 11.6–15.4)
WBC: 7.1 10*3/uL (ref 3.4–10.8)

## 2021-01-05 LAB — LIPID PANEL
Chol/HDL Ratio: 4.4 ratio (ref 0.0–5.0)
Cholesterol, Total: 123 mg/dL (ref 100–199)
HDL: 28 mg/dL — ABNORMAL LOW (ref >39)
LDL Chol Calc (NIH): 60 mg/dL (ref 0–99)
Triglycerides: 215 mg/dL — ABNORMAL HIGH (ref 0–149)
VLDL Cholesterol Cal: 35 mg/dL (ref 5–40)

## 2021-01-05 LAB — COMPREHENSIVE METABOLIC PANEL
ALT: 8 IU/L (ref 0–44)
AST: 15 IU/L (ref 0–40)
Albumin/Globulin Ratio: 1.4 (ref 1.2–2.2)
Albumin: 3.8 g/dL (ref 3.6–4.6)
Alkaline Phosphatase: 69 IU/L (ref 44–121)
BUN/Creatinine Ratio: 24 (ref 10–24)
BUN: 17 mg/dL (ref 8–27)
Bilirubin Total: 0.2 mg/dL (ref 0.0–1.2)
CO2: 25 mmol/L (ref 20–29)
Calcium: 8.9 mg/dL (ref 8.6–10.2)
Chloride: 103 mmol/L (ref 96–106)
Creatinine, Ser: 0.72 mg/dL — ABNORMAL LOW (ref 0.76–1.27)
Globulin, Total: 2.8 g/dL (ref 1.5–4.5)
Glucose: 102 mg/dL — ABNORMAL HIGH (ref 65–99)
Potassium: 4.1 mmol/L (ref 3.5–5.2)
Sodium: 138 mmol/L (ref 134–144)
Total Protein: 6.6 g/dL (ref 6.0–8.5)
eGFR: 88 mL/min/{1.73_m2} (ref >59)

## 2021-01-05 NOTE — Telephone Encounter (Signed)
Patient's wife is returning call to discuss lab results. 

## 2021-01-05 NOTE — Telephone Encounter (Signed)
Pt's wife aware of lab results and will forward copy to PCP and mail copy to pt per request .Adonis Housekeeper

## 2021-01-06 ENCOUNTER — Other Ambulatory Visit: Payer: Self-pay | Admitting: Neurology

## 2021-01-18 ENCOUNTER — Other Ambulatory Visit: Payer: Self-pay

## 2021-01-18 ENCOUNTER — Encounter: Payer: Self-pay | Admitting: Neurology

## 2021-01-18 ENCOUNTER — Ambulatory Visit: Payer: Medicare Other | Admitting: Neurology

## 2021-01-18 VITALS — BP 105/64 | HR 79 | Ht 67.0 in | Wt 121.6 lb

## 2021-01-18 DIAGNOSIS — F0150 Vascular dementia without behavioral disturbance: Secondary | ICD-10-CM

## 2021-01-18 MED ORDER — DONEPEZIL HCL 10 MG PO TABS: 10.0000 mg | ORAL_TABLET | Freq: Every day | ORAL | 0 refills | Status: DC

## 2021-01-18 MED ORDER — MEMANTINE HCL 10 MG PO TABS: 10.0000 mg | ORAL_TABLET | Freq: Two times a day (BID) | ORAL | 0 refills | Status: DC

## 2021-01-18 NOTE — Patient Instructions (Signed)
Good to see you! Continue Donepezil '10mg'$  daily and Memantine '10mg'$  twice a day. Further refills can be through your family doctor. Let us know if we can be of assistance in the future. Follow-up as needed, call for any changes   FALL PRECAUTIONS: Be cautious when walking. Scan the area for obstacles that may increase the risk of trips and falls. When getting up in the mornings, sit up at the edge of the bed for a few minutes before getting out of bed. Consider elevating the bed at the head end to avoid drop of blood pressure when getting up. Walk always in a well-lit room (use night lights in the walls). Avoid area rugs or power cords from appliances in the middle of the walkways. Use a walker or a cane if necessary and consider physical therapy for balance exercise. Get your eyesight checked regularly.   HOME SAFETY: Consider the safety of the kitchen when operating appliances like stoves, microwave oven, and blender. Consider having supervision and share cooking responsibilities until no longer able to participate in those. Accidents with firearms and other hazards in the house should be identified and addressed as well.   ABILITY TO BE LEFT ALONE: If patient is unable to contact 911 operator, consider using LifeLine, or when the need is there, arrange for someone to stay with patients. Smoking is a fire hazard, consider supervision or cessation. Risk of wandering should be assessed by caregiver and if detected at any point, supervision and safe proof recommendations should be instituted.   RECOMMENDATIONS FOR ALL PATIENTS WITH MEMORY PROBLEMS: 1. Continue to exercise (Recommend 30 minutes of walking everyday, or 3 hours every week) 2. Increase social interactions - continue going to [place] and enjoy social gatherings with friends and family 3. Eat healthy, avoid fried foods and eat more fruits and vegetables 4. Maintain adequate blood pressure, blood sugar, and blood cholesterol level. Reducing the  risk of stroke and cardiovascular disease also helps promoting better memory. 5. Avoid stressful situations. Live a simple life and avoid aggravations. Organize your time and prepare for the next day in anticipation. 6. Sleep well, avoid any interruptions of sleep and avoid any distractions in the bedroom that may interfere with adequate sleep quality 7. Avoid sugar, avoid sweets as there is a strong link between excessive sugar intake, diabetes, and cognitive impairment The Mediterranean diet has been shown to help patients reduce the risk of progressive memory disorders and reduces cardiovascular risk. This includes eating fish, eat fruits and green leafy vegetables, nuts like almonds and hazelnuts, walnuts, and also use olive oil. Avoid fast foods and fried foods as much as possible. Avoid sweets and sugar as sugar use has been linked to worsening of memory function.  There is always a concern of gradual progression of memory problems. If this is the case, then we may need to adjust level of care according to patient needs. Support, both to the patient and caregiver, should then be put into place.

## 2021-01-18 NOTE — Progress Notes (Signed)
NEUROLOGY FOLLOW UP OFFICE NOTE  [name] [id number] 11-Mar-1932  HISTORY OF PRESENT ILLNESS: I had the pleasure of seeing Daniel Chapman in follow-up in the neurology clinic on 01/18/2021.  The patient was last seen 8 months ago for vascular dementia. He is again accompanied by his wife who helps supplement the history today.  Records and images were personally reviewed where available.  MMSE 11/30 in 04/2020 (18/30 in 01/2019). He is on Donepezil '10mg'$  daily and Memantine '10mg'$  BID without side effects. His wife reports more short-term memory changes. He has stopped driving. His wife manages finances and medications. He needs assistance with dressing and bathing. His wife denies any difficulties following instructions, however he is noted to have some difficulties today, which may be due to hearing loss as well. He refuses to wear his hearing aids. His wife denies any significant behavioral changes, no paranoia or hallucinations. He again makes jokes in the office today and appears more disinhibited (touching and pulling at my shoe), however his wife states he is actually joking less now. When asked how he is feeling, he states "with my hands." He denies any headaches, dizziness, focal numbness/tingling/weakness. Sleep is good per wife, she notes he sleeps all the time, even during the day. No wandering behaviors. No falls.    History on Initial Assessment 02/17/2019: This is a pleasant 87 year old right-handed man with a history of hyperlipidemia, vocal cord carcinoma s/p radiation, right carotid artery occlusion, right cerebellar stroke, presenting for evaluation of memory loss. He is very jovial and makes several jokes during the visit, his wife reports this is his baseline, "his goal in life is to make people laugh." His wife noticed memory changes since his stroke in 2018, but over the past few weeks, she feels it had suddenly gotten worse. She states it is not so much memory as it is being able to  comprehend what he is being told. She gives some examples, he took the dog out and forget about their electric fence, he said he could not get the dog in the car, she had to tell him 4 times that he had to move across the fence. She would have to tell things repeatedly and he has difficulty hearing, which irritates him. 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 He stopped doing it 6 months ago. His wife thinks he got lost one time in an unfamiliar road, he was fine with familiar roads. His wife sets out his medications and he remembers to take them. His wife manages finances. She has noticed he has been more irritable the past few months, no paranoia or hallucinations. He reports his mood is happy. He states "I'm a little crazy." He denies any family history of dementia, his wife reminds him his sister and father had Alzheimer's disease.No history of significant head injuries, no alcohol use. He smokes 1/2 PPD, his wife thinks it is more than that.   He denies any headaches, dizziness, diplopia, dysarthria, dysphagia, neck/back pain, focal numbness/tingling/weakness, bowel/bladder dysfunction. No anosmia, tremors, no falls. He has no physical residual deficits from the stroke, he felt dizzy and fell down, no focal weakness. He reports sleep is good. His wife states he sleeps too much and takes several naps during the day. He does not snore. His wife states he walks so slow, he used to be "full blast." He  is taking Donepezil '10mg'$  daily without side effects, he has a prescription for uptitration of Memantine which he has not started yet.  I personally reviewed MRI brain in 01/2017 which showed a late acute/early subacute infarct in the right inferior cerebellum, mild chronic microvascular disease and diffuse volume loss. Right ICA occlusion with reconstiation at the paraclinoid segment.  MRI brain  without contrast done 02/2019 did not show any acute changes, there is moderate diffuse atrophy and chronic microvascular disease, remote right PICA territory infarct, chronic occlusion of right ICA  PAST MEDICAL HISTORY: Past Medical History:  Diagnosis Date   Carcinoma of vocal cord ([place]) 2008   radiation therapy   Carotid artery occlusion    a. 09/2017 occ RICA, 1-39% L, occ prox LECA followed by [facility].   Chronic atrial fibrillation (HCC)    COPD (chronic obstructive pulmonary disease) ([place])    Dementia ([place])    Fall due to ice or snow 07/21/2013   Partial Right Hip replaced   History of ITP    Hx of colonic polyps    Hyperlipemia    Osteopenia    ?   Osteoporosis    dexa 06, declines further dexa or tx    Smoker    Stroke [facility])     MEDICATIONS: Current Outpatient Medications on File Prior to Visit  Medication Sig Dispense Refill   calcium carbonate (TUMS EX) 750 MG chewable tablet Chew 1 tablet by mouth daily. Reported on 09/15/2015     cholecalciferol (VITAMIN D) 1000 UNITS tablet Take 1,000 Units by mouth daily. Reported on 09/15/2015     donepezil (ARICEPT) 10 MG tablet Take 1 tablet (10 mg total) by mouth at bedtime. 90 tablet 3   ELIQUIS 2.5 MG TABS tablet TAKE 1 TABLET BY MOUTH  TWICE DAILY 180 tablet 3   memantine (NAMENDA) 10 MG tablet Take 1 tablet (10 mg total) by mouth 2 (two) times daily. 60 tablet 0   rosuvastatin (CRESTOR) 5 MG tablet TAKE 1 TABLET BY MOUTH 3  TIMES WEEKLY ON MONDAY,  WEDNESDAY, AND FRIDAY 45 tablet 3   No current facility-administered medications on file prior to visit.    ALLERGIES: No Known Allergies  FAMILY HISTORY: Family History  Problem Relation Age of Onset   Uterine cancer Mother    Heart disease Sister        some heart trouble   Colon cancer Neg Hx    Stomach cancer Neg Hx    Pancreatic cancer Neg Hx    Esophageal cancer Neg Hx     SOCIAL HISTORY: Social History   Socioeconomic History   Marital status: Married     Spouse name: Not on file   Number of children: 6   Years of education: Not on file   Highest education level: Not on file  Occupational History   Occupation: Biochemist, clinical: SELF-EMPLOYED  Tobacco Use   Smoking status: Every Day    Packs/day: 0.50    Types: Cigarettes   Smokeless tobacco: Never  Vaping Use   Vaping Use: Never used  Substance and Sexual Activity   Alcohol use: No    Alcohol/week: 0.0 standard drinks   Drug use: No   Sexual activity: Not Currently    Partners: Female  Other Topics Concern   Not on file  Social History Narrative   Lives with wife      Left handed      2 years of college  Veteran      Social Determinants of Health   Financial Resource Strain: Not on file  Food Insecurity: Not on file  Transportation Needs: Not on file  Physical Activity: Not on file  Stress: Not on file  Social Connections: Not on file  Intimate Partner Violence: Not on file     PHYSICAL EXAM: Vitals:   01/18/21 1606  BP: 105/64  Pulse: 79  SpO2: 97%   General: No acute distress Head:  Normocephalic/atraumatic Skin/Extremities: No rash, no edema Neurological Exam: alert and oriented to person, place. He states month is "January," his wife tells him to "do it right" saying he usually knows the month, however on repeated questioning, he cannot give month or correct year. No aphasia or dysarthria. Fund of knowledge is reduced.  Recent and remote memory are impaired, 1/3 delayed recall. Attention and concentration are reduced, 4/5 WORLD backward.  Cranial nerves: Pupils equal, round. Extraocular movements intact with no nystagmus. Visual fields full.  No facial asymmetry.  Motor: Bulk and tone normal, muscle strength 5/5 throughout with no pronator drift.   Finger to nose testing intact.  Gait narrow-based and steady, no ataxia.    IMPRESSION: This is a pleasant 87 yo RH man with a history of hyperlipidemia, vocal cord carcinoma s/p radiation, right  carotid artery occlusion, right cerebellar stroke, with likely vascular dementia without behavioral disturbance. MMSE 11/30 in 04/2020 (18/30 in 01/2019). His wife reports more short-term memory changes, we again discussed prognosis in patients with dementia. Continue Donepezil '10mg'$  daily and Memantine '10mg'$  BID. We discussed that he is on the medications available for dementia, further refills can be from PCP. No significant behavioral changes (although he appears more disinhibited today, his wife states this is less than before). He does not drive. We discussed home/fall safety, his wife denies any needs at this time, continue 24/7 care. Follow-up as needed, they know to call for any changes.   Thank you for allowing me to participate in his care.  Please do not hesitate to call for any questions or concerns.    Ellouise Newer, M.D.   CC: Thayer Ohm, [place]

## 2021-01-27 DIAGNOSIS — M818 Other osteoporosis without current pathological fracture: Secondary | ICD-10-CM | POA: Diagnosis not present

## 2021-01-27 DIAGNOSIS — I739 Peripheral vascular disease, unspecified: Secondary | ICD-10-CM | POA: Diagnosis not present

## 2021-01-27 DIAGNOSIS — J449 Chronic obstructive pulmonary disease, unspecified: Secondary | ICD-10-CM | POA: Diagnosis not present

## 2021-01-27 DIAGNOSIS — F17200 Nicotine dependence, unspecified, uncomplicated: Secondary | ICD-10-CM | POA: Diagnosis not present

## 2021-01-27 DIAGNOSIS — E538 Deficiency of other specified B group vitamins: Secondary | ICD-10-CM | POA: Diagnosis not present

## 2021-01-27 DIAGNOSIS — Z23 Encounter for immunization: Secondary | ICD-10-CM | POA: Diagnosis not present

## 2021-01-27 DIAGNOSIS — Z0000 Encounter for general adult medical examination without abnormal findings: Secondary | ICD-10-CM | POA: Diagnosis not present

## 2021-01-27 DIAGNOSIS — D7589 Other specified diseases of blood and blood-forming organs: Secondary | ICD-10-CM | POA: Diagnosis not present

## 2021-01-27 DIAGNOSIS — I4819 Other persistent atrial fibrillation: Secondary | ICD-10-CM | POA: Diagnosis not present

## 2021-01-27 DIAGNOSIS — E782 Mixed hyperlipidemia: Secondary | ICD-10-CM | POA: Diagnosis not present

## 2021-02-09 DIAGNOSIS — D510 Vitamin B12 deficiency anemia due to intrinsic factor deficiency: Secondary | ICD-10-CM | POA: Diagnosis not present

## 2021-02-16 DIAGNOSIS — D510 Vitamin B12 deficiency anemia due to intrinsic factor deficiency: Secondary | ICD-10-CM | POA: Diagnosis not present

## 2021-02-19 ENCOUNTER — Other Ambulatory Visit: Payer: Self-pay

## 2021-02-19 ENCOUNTER — Ambulatory Visit (INDEPENDENT_AMBULATORY_CARE_PROVIDER_SITE_OTHER): Payer: Medicare Other | Admitting: Podiatry

## 2021-02-19 ENCOUNTER — Encounter: Payer: Self-pay | Admitting: Podiatry

## 2021-02-19 DIAGNOSIS — M79674 Pain in right toe(s): Secondary | ICD-10-CM

## 2021-02-19 DIAGNOSIS — M79675 Pain in left toe(s): Secondary | ICD-10-CM

## 2021-02-19 DIAGNOSIS — Z9229 Personal history of other drug therapy: Secondary | ICD-10-CM

## 2021-02-19 DIAGNOSIS — B351 Tinea unguium: Secondary | ICD-10-CM | POA: Diagnosis not present

## 2021-02-19 NOTE — Patient Instructions (Signed)
Onychomycosis/Fungal Toenails  WHAT IS IT? An infection that lies within the keratin of your nail plate that is caused by a fungus.  WHY ME? Fungal infections affect all ages, sexes, races, and creeds.  There may be many factors that predispose you to a fungal infection such as age, coexisting medical conditions such as diabetes, or an autoimmune disease; stress, medications, fatigue, genetics, etc.  Bottom line: fungus thrives in a warm, moist environment and your shoes offer such a location.  IS IT CONTAGIOUS? Theoretically, yes.  You do not want to share shoes, nail clippers or files with someone who has fungal toenails.  Walking around barefoot in the same room or sleeping in the same bed is unlikely to transfer the organism.  It is important to realize, however, that fungus can spread easily from one nail to the next on the same foot.  HOW DO WE TREAT THIS?  There are several ways to treat this condition.  Treatment may depend on many factors such as age, medications, pregnancy, liver and kidney conditions, etc.  It is best to ask your doctor which options are available to you.  No treatment.   Unlike many other medical concerns, you can live with this condition.  However for many people this can be a painful condition and may lead to ingrown toenails or a bacterial infection.  It is recommended that you keep the nails cut short to help reduce the amount of fungal nail. Topical treatment.  These range from herbal remedies to prescription strength nail lacquers.  About 40-50% effective, topicals require twice daily application for approximately 9 to 12 months or until an entirely new nail has grown out.  The most effective topicals are medical grade medications available through physicians offices. Oral antifungal medications.  With an 80-90% cure rate, the most common oral medication requires 3 to 4 months of therapy and stays in your system for a year as the new nail grows out.  Oral antifungal  medications do require blood work to make sure it is a safe drug for you.  A liver function panel will be performed prior to starting the medication and after the first month of treatment.  It is important to have the blood work performed to avoid any harmful side effects.  In general, this medication safe but blood work is required. Laser Therapy.  This treatment is performed by applying a specialized laser to the affected nail plate.  This therapy is noninvasive, fast, and non-painful.  It is not covered by insurance and is therefore, out of pocket.  The results have been very good with a 80-95% cure rate.  The [facility] is the only practice in the area to offer this therapy. Permanent Nail Avulsion.  Removing the entire nail so that a new nail will not grow back.  Smoking Tobacco Information, Adult Smoking tobacco can be harmful to your health. Tobacco contains a poisonous (toxic), colorless chemical called nicotine. Nicotine is addictive. It changes the brain and can make it hard to stop smoking. Tobacco also has other toxic chemicals that can hurt your body and raise your risk of many cancers. How can smoking tobacco affect me? Smoking tobacco puts you at risk for: Cancer. Smoking is most commonly associated with lung cancer, but can also lead to cancer in other parts of the body. Chronic obstructive pulmonary disease (COPD). This is a long-term lung condition that makes it hard to breathe. It also gets worse over time. High blood pressure (  hypertension), heart disease, stroke, or heart attack. Lung infections, such as pneumonia. Cataracts. This is when the lenses in the eyes become clouded. Digestive problems. This may include peptic ulcers, heartburn, and gastroesophageal reflux disease (GERD). Oral health problems, such as gum disease and tooth loss. Loss of taste and smell. Smoking can affect your appearance by causing: Wrinkles. Yellow or stained teeth, fingers, and  fingernails. Smoking tobacco can also affect your social life, because: It may be challenging to find places to smoke when away from home. Many workplaces, [employer], hotels, and public places are tobacco-free. Smoking is expensive. This is due to the cost of tobacco and the long-term costs of treating health problems from smoking. Secondhand smoke may affect those around you. Secondhand smoke can cause lung cancer, breathing problems, and heart disease. Children of smokers have a higher risk for: Sudden infant death syndrome (SIDS). Ear infections. Lung infections. If you currently smoke tobacco, quitting now can help you: Lead a longer and healthier life. Look, smell, breathe, and feel better over time. Save money. Protect others from the harms of secondhand smoke. What actions can I take to prevent health problems? Quit smoking  Do not start smoking. Quit if you already do. Make a plan to quit smoking and commit to it. Look for programs to help you and ask your health care provider for recommendations and ideas. Set a date and write down all the reasons you want to quit. Let your friends and family know you are quitting so they can help and support you. Consider finding friends who also want to quit. It can be easier to quit with someone else, so that you can support each other. Talk with your health care provider about using nicotine replacement medicines to help you quit, such as gum, lozenges, patches, sprays, or pills. Do not replace cigarette smoking with electronic cigarettes, which are commonly called e-cigarettes. The safety of e-cigarettes is not known, and some may contain harmful chemicals. If you try to quit but return to smoking, stay positive. It is common to slip up when you first quit, so take it one day at a time. Be prepared for cravings. When you feel the urge to smoke, chew gum or suck on hard candy. Lifestyle Stay busy and take care of your body. Drink enough fluid  to keep your urine pale yellow. Get plenty of exercise and eat a healthy diet. This can help prevent weight gain after quitting. Monitor your eating habits. Quitting smoking can cause you to have a larger appetite than when you smoke. Find ways to relax. Go out with friends or family to a movie or a restaurant where people do not smoke. Ask your health care provider about having regular tests (screenings) to check for cancer. This may include blood tests, imaging tests, and other tests. Find ways to manage your stress, such as meditation, yoga, or exercise. Where to find support To get support to quit smoking, consider: Asking your health care provider for more information and resources. Taking classes to learn more about quitting smoking. Looking for local organizations that offer resources about quitting smoking. Joining a support group for people who want to quit smoking in your local community. Calling the smokefree.gov counselor helpline: 1-800-Quit-Now (469) 432-5782) Where to find more information You may find more information about quitting smoking from: HelpGuide.org: www.helpguide.org https://hall.com/: smokefree.gov American Lung Association: www.lung.org Contact a health care provider if you: Have problems breathing. Notice that your lips, nose, or fingers turn blue. Have chest pain.  Are coughing up blood. Feel faint or you pass out. Have other health changes that cause you to worry. Summary Smoking tobacco can negatively affect your health, the health of those around you, your finances, and your social life. Do not start smoking. Quit if you already do. If you need help quitting, ask your health care provider. Think about joining a support group for people who want to quit smoking in your local community. There are many effective programs that will help you to quit this behavior. This information is not intended to replace advice given to you by your health care provider. Make  sure you discuss any questions you have with your health care provider. Document Revised: 08/05/2020 Document Reviewed: 04/07/2020 Elsevier Patient Education  2022 [employer].

## 2021-02-19 NOTE — Progress Notes (Signed)
Subjective: Daniel Chapman presents today referred by Johna Roles, PA for complaint of long standing painful thick toenails that are difficult to trim. Pain interferes with ambulation. Aggravating factors include wearing enclosed shoe gear.   Wife states she has tried to cut his toenails, but is afraid she may injure his toes because the nails curve over the tips of his toes. Patient is also on blood thinner.  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 09/2017 occ RICA, 1-39% L, occ prox LECA followed by VVS.   Chronic atrial fibrillation (HCC)    COPD (chronic obstructive pulmonary disease) ([place])    Dementia ([place])    Fall due to ice or snow 07/21/2013   Partial Right Hip replaced   History of ITP    Hx of colonic polyps    Hyperlipemia    Osteopenia    ?   Osteoporosis    dexa 06, declines further dexa or tx    Smoker    Stroke [facility])      Patient Active Problem List   Diagnosis Date Noted   Cellulitis of left lower extremity 10/02/2019   Wound of right foot 10/02/2019   History of laryngeal cancer 09/05/2019   Dementia ([place]) 04/23/2018   Sleep concern 04/23/2018   Urinary frequency 10/11/2017   BPH (benign prostatic hyperplasia) 10/11/2017   Chronic anticoagulation 06/29/2017   Carotid occlusion, right    H/O: stroke 02/06/2017   Atrial fibrillation, new onset ([place]) 02/06/2017   Encounter for Medicare annual wellness exam 12/17/2014   Special screening for malignant neoplasms, colon 12/17/2014   Routine general medical examination at a health care facility 12/09/2014   Aneurysm of abdominal vessel ([place]) 09/11/2013   S/P total hip arthroplasty 08/02/2013   H/O splenectomy 08/02/2013   History of hip fracture 07/20/2013   Fall at home 07/20/2013   Occlusion and stenosis of carotid artery without mention of cerebral infarction 08/28/2012   Carotid stenosis 08/14/2012   Bruit of  left carotid artery 08/01/2012   Anxiety 01/27/2012   Osteopenia 07/19/2011   Osteoporosis 07/19/2011   HOARSENESS, CHRONIC 02/05/2007   Chronic hoarseness 02/05/2007   Hyperlipidemia 11/06/2006   TOBACCO ABUSE 11/06/2006   COPD (chronic obstructive pulmonary disease) ([place]) 11/06/2006   COLONIC POLYPS, HX OF 11/06/2006   PROSTATITIS, HX OF 11/06/2006   Tobacco dependence 11/06/2006     Past Surgical History:  Procedure Laterality Date   BONE MARROW BIOPSY  05/2003   CARDIOVERSION N/A 03/22/2017   Procedure: CARDIOVERSION;  Surgeon: Acie Fredrickson Wonda Cheng, MD;  Location: [facility];  Service: Cardiovascular;  Laterality: N/A;   CATARACT EXTRACTION     Dexa     per patient ? osteopenia   HEMORRHOID SURGERY     JOINT REPLACEMENT Right Feb. 22, 2015   Partial Hip   SPLENECTOMY     TONSILLECTOMY AND ADENOIDECTOMY Bilateral age 56 per patient     Current Outpatient Medications on File Prior to Visit  Medication Sig Dispense Refill   calcium carbonate (TUMS EX) 750 MG chewable tablet Chew 1 tablet by mouth daily. Reported on 09/15/2015     cholecalciferol (VITAMIN D) 1000 UNITS tablet Take 1,000 Units by mouth daily. Reported on 09/15/2015     donepezil (ARICEPT) 10 MG tablet Take 1 tablet (10 mg total) by mouth at bedtime. 90 tablet 0   ELIQUIS 2.5 MG TABS tablet  TAKE 1 TABLET BY MOUTH  TWICE DAILY 180 tablet 3   memantine (NAMENDA) 10 MG tablet Take 1 tablet (10 mg total) by mouth 2 (two) times daily. 180 tablet 0   rosuvastatin (CRESTOR) 5 MG tablet TAKE 1 TABLET BY MOUTH 3  TIMES WEEKLY ON MONDAY,  WEDNESDAY, AND FRIDAY 45 tablet 3   No current facility-administered medications on file prior to visit.     No Known Allergies   Social History   Occupational History   Occupation: Biochemist, clinical: SELF-EMPLOYED  Tobacco Use   Smoking status: Every Day    Packs/day: 0.50    Types: Cigarettes   Smokeless tobacco: Never  Vaping Use   Vaping Use: Never used  Substance  and Sexual Activity   Alcohol use: No    Alcohol/week: 0.0 standard drinks   Drug use: No   Sexual activity: Not Currently    Partners: Female     Family History  Problem Relation Age of Onset   Uterine cancer Mother    Heart disease Sister        some heart trouble   Colon cancer Neg Hx    Stomach cancer Neg Hx    Pancreatic cancer Neg Hx    Esophageal cancer Neg Hx      Immunization History  Administered Date(s) Administered   Influenza Split 04/06/2011, 03/07/2012   Influenza Whole 11/26/2004, 04/06/2007, 03/27/2008, 05/08/2009, 03/09/2010   Influenza, High Dose Seasonal PF 03/05/2019   Influenza,inj,Quad PF,6+ Mos 03/14/2013, 03/19/2014, 04/21/2015, 02/24/2017, 04/23/2018   Influenza-Unspecified 05/20/2016   PPD Test 07/24/2013   Pneumococcal Conjugate-13 12/17/2014   Pneumococcal Polysaccharide-23 04/30/2003, 04/06/2007   Td 09/28/2002   Zoster, Live 02/28/2014     Objective: Daniel Chapman is a pleasant 87 y.o. male WD, WN in NAD. AAO x 2.  There were no vitals filed for this visit.  Vascular Examination:  Capillary fill time to digits <3 seconds b/l lower extremities. Faintly palpable DP pulse(s) b/l lower extremities. Faintly palpable PT pulse(s) b/l lower extremities. Pedal hair absent. Lower extremity skin temperature gradient warm to cool. No pain with calf compression b/l. No edema noted b/l lower extremities.  Dermatological Examination: Pedal skin is thin shiny, atrophic b/l lower extremities. No open wounds b/l lower extremities. No interdigital macerations b/l lower extremities. Toenails 1-5 b/l elongated, discolored, dystrophic, thickened, crumbly with subungual debris and tenderness to dorsal palpation. No hyperkeratotic nor porokeratotic lesions present on today's visit.  Musculoskeletal: Normal muscle strength 5/5 to all lower extremity muscle groups bilaterally. No pain crepitus or joint limitation noted with ROM b/l lower extremities. No gross bony  deformities b/l lower extremities. Patient ambulates independent of any assistive aids.  Neurological: Protective sensation intact 5/5 intact bilaterally with 10g monofilament b/l. Vibratory sensation intact b/l. Deep tendon reflexes normal b/l.  Clonus negative b/l.  Assessment: 1. Pain due to onychomycosis of toenails of both feet   2. Hx of long term use of blood thinners     Plan: -Examined patient. -Information given on smoking tobacco. -Patient/POA educated on dangers of using sharp instrumentation on toes/feet. Recommended continued professional foot care in presence of anticoagulant use. Patient/POA relates understanding. -Patient to continue soft, supportive shoe gear daily. -Toenails 1-5 b/l were debrided in length and girth with sterile nail nippers and dremel without iatrogenic bleeding.  -Patient to report any pedal injuries to medical professional immediately. -Patient/POA to call should there be question/concern in the interim.  Return in about 3 months (  around 05/21/2021).  Marzetta Board, DPM

## 2021-03-01 DIAGNOSIS — M818 Other osteoporosis without current pathological fracture: Secondary | ICD-10-CM | POA: Diagnosis not present

## 2021-03-01 DIAGNOSIS — D510 Vitamin B12 deficiency anemia due to intrinsic factor deficiency: Secondary | ICD-10-CM | POA: Diagnosis not present

## 2021-03-01 DIAGNOSIS — J449 Chronic obstructive pulmonary disease, unspecified: Secondary | ICD-10-CM | POA: Diagnosis not present

## 2021-03-01 DIAGNOSIS — E782 Mixed hyperlipidemia: Secondary | ICD-10-CM | POA: Diagnosis not present

## 2021-03-02 DIAGNOSIS — D510 Vitamin B12 deficiency anemia due to intrinsic factor deficiency: Secondary | ICD-10-CM | POA: Diagnosis not present

## 2021-03-09 DIAGNOSIS — D510 Vitamin B12 deficiency anemia due to intrinsic factor deficiency: Secondary | ICD-10-CM | POA: Diagnosis not present

## 2021-03-14 ENCOUNTER — Other Ambulatory Visit: Payer: Self-pay | Admitting: Neurology

## 2021-03-16 ENCOUNTER — Other Ambulatory Visit: Payer: Self-pay | Admitting: Physician Assistant

## 2021-03-16 ENCOUNTER — Ambulatory Visit
Admission: RE | Admit: 2021-03-16 | Discharge: 2021-03-16 | Disposition: A | Discharge status: Home / Self Care | Payer: Medicare Other | Source: Ambulatory Visit | Attending: Physician Assistant | Admitting: Physician Assistant

## 2021-03-16 DIAGNOSIS — Z789 Other specified health status: Secondary | ICD-10-CM

## 2021-03-16 DIAGNOSIS — S0990XD Unspecified injury of head, subsequent encounter: Secondary | ICD-10-CM

## 2021-03-16 DIAGNOSIS — W19XXXD Unspecified fall, subsequent encounter: Secondary | ICD-10-CM

## 2021-03-16 DIAGNOSIS — S0990XA Unspecified injury of head, initial encounter: Secondary | ICD-10-CM | POA: Diagnosis not present

## 2021-03-17 DIAGNOSIS — D510 Vitamin B12 deficiency anemia due to intrinsic factor deficiency: Secondary | ICD-10-CM | POA: Diagnosis not present

## 2021-03-17 DIAGNOSIS — E538 Deficiency of other specified B group vitamins: Secondary | ICD-10-CM | POA: Diagnosis not present

## 2021-03-28 ENCOUNTER — Other Ambulatory Visit: Payer: Self-pay | Admitting: Neurology

## 2021-03-30 DIAGNOSIS — J449 Chronic obstructive pulmonary disease, unspecified: Secondary | ICD-10-CM | POA: Diagnosis not present

## 2021-03-30 DIAGNOSIS — M818 Other osteoporosis without current pathological fracture: Secondary | ICD-10-CM | POA: Diagnosis not present

## 2021-03-30 DIAGNOSIS — E782 Mixed hyperlipidemia: Secondary | ICD-10-CM | POA: Diagnosis not present

## 2021-03-30 DIAGNOSIS — D510 Vitamin B12 deficiency anemia due to intrinsic factor deficiency: Secondary | ICD-10-CM | POA: Diagnosis not present

## 2021-04-08 IMAGING — CR DG PELVIS 1-2V
1 series · 1 of 1 positions shown · non-contrast
Comparison: None.

CLINICAL DATA: Right hip pain

EXAM:
PELVIS - 1-2 VIEW

[t pelvis a.p.]
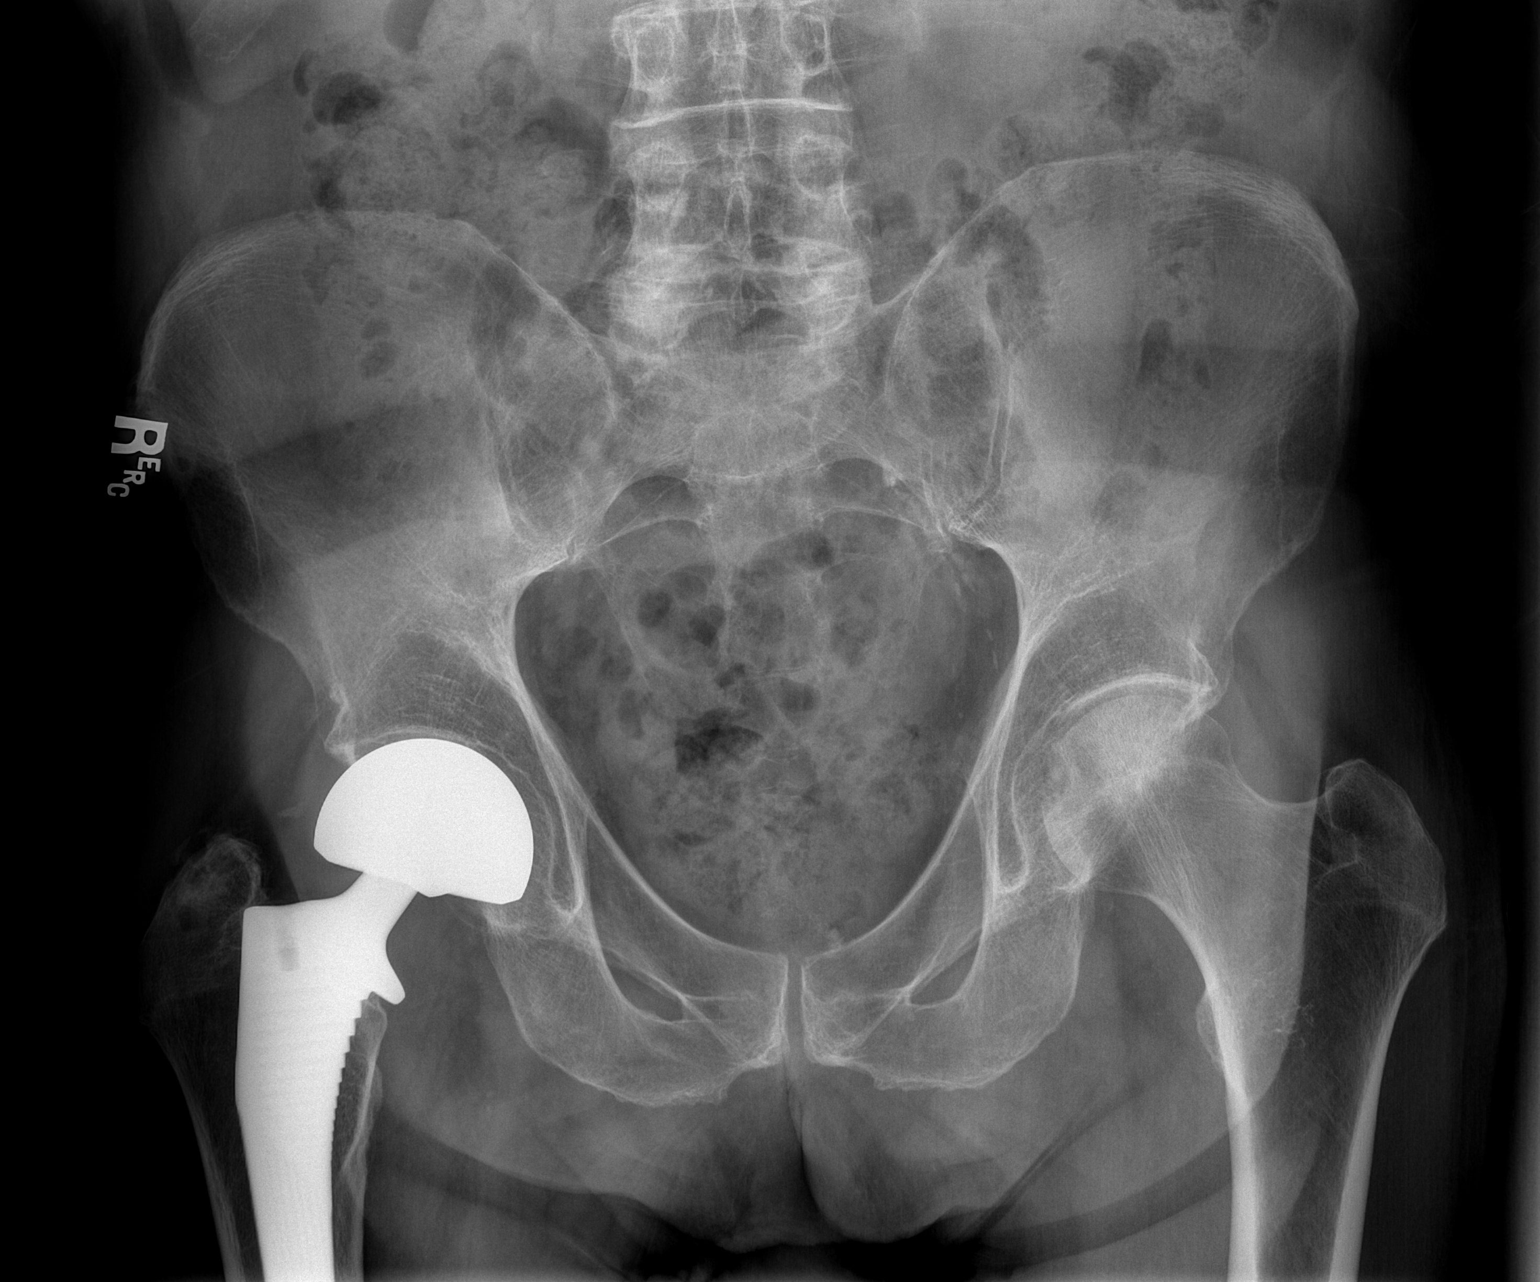

[1 of 1 positions shown; findings below may reference images not displayed]

FINDINGS: The pelvis and sacrum are intact. Right hip bipolar hemiarthroplasty
has been performed. No evidence of fracture or dislocation involving
the hips on this limited examination. At least mild left hip
degenerative arthritis is noted. Soft tissues are unremarkable.
IMPRESSION: No acute fracture or dislocation.

## 2021-04-13 DIAGNOSIS — D510 Vitamin B12 deficiency anemia due to intrinsic factor deficiency: Secondary | ICD-10-CM | POA: Diagnosis not present

## 2021-05-03 ENCOUNTER — Other Ambulatory Visit: Payer: Self-pay

## 2021-05-03 MED ORDER — ROSUVASTATIN CALCIUM 5 MG PO TABS: ORAL_TABLET | ORAL | 2 refills | Status: AC

## 2021-05-17 DIAGNOSIS — E538 Deficiency of other specified B group vitamins: Secondary | ICD-10-CM | POA: Diagnosis not present

## 2021-06-02 ENCOUNTER — Ambulatory Visit: Payer: Medicare Other | Admitting: Podiatry

## 2021-06-02 ENCOUNTER — Other Ambulatory Visit: Payer: Self-pay

## 2021-06-02 ENCOUNTER — Encounter: Payer: Self-pay | Admitting: Podiatry

## 2021-06-02 DIAGNOSIS — D510 Vitamin B12 deficiency anemia due to intrinsic factor deficiency: Secondary | ICD-10-CM | POA: Insufficient documentation

## 2021-06-02 DIAGNOSIS — M79675 Pain in left toe(s): Secondary | ICD-10-CM

## 2021-06-02 DIAGNOSIS — I739 Peripheral vascular disease, unspecified: Secondary | ICD-10-CM | POA: Insufficient documentation

## 2021-06-02 DIAGNOSIS — F05 Delirium due to known physiological condition: Secondary | ICD-10-CM | POA: Insufficient documentation

## 2021-06-02 DIAGNOSIS — I714 Abdominal aortic aneurysm, without rupture, unspecified: Secondary | ICD-10-CM | POA: Insufficient documentation

## 2021-06-02 DIAGNOSIS — F0150 Vascular dementia without behavioral disturbance: Secondary | ICD-10-CM | POA: Insufficient documentation

## 2021-06-02 DIAGNOSIS — D7589 Other specified diseases of blood and blood-forming organs: Secondary | ICD-10-CM | POA: Insufficient documentation

## 2021-06-02 DIAGNOSIS — M79674 Pain in right toe(s): Secondary | ICD-10-CM | POA: Diagnosis not present

## 2021-06-02 DIAGNOSIS — B351 Tinea unguium: Secondary | ICD-10-CM

## 2021-06-06 NOTE — Progress Notes (Signed)
°  Subjective:  Patient ID: Daniel Chapman, male    DOB: 09-10-1931,  MRN: [id number]  Daniel Chapman presents to clinic today for painful elongated mycotic toenails 1-5 bilaterally which are tender when wearing enclosed shoe gear. Pain is relieved with periodic professional debridement.  Patient has h/o dementia and  is accompanied by his wife on today's visit. They voice no new pedal concerns on today's visit.  PCP is [facility], [place], [place] , and last visit was 11/13/2019.  No Known Allergies  Review of Systems: Negative except as noted in the HPI. Objective:   Constitutional Daniel Chapman is a pleasant 88 y.o. Caucasian male, WD, WN in NAD. AAO x 3.   Vascular CFT <3 seconds b/l LE. Faintly palpable pedal pulses b/l LE. Pedal hair absent b/l LE. Skin temperature gradient WNL b/l. No pain with calf compression b/l. No edema b/l LE. No cyanosis or clubbing noted b/l LE.  Neurologic Normal speech. Oriented to person, place, and time. Protective sensation intact 5/5 intact bilaterally with 10g monofilament b/l. Vibratory sensation intact b/l.  Dermatologic Pedal skin thin, shiny and atrophic b/l LE. Toenails 1-5 b/l well maintained with adequate length. No erythema, no edema, no drainage, no fluctuance.  Orthopedic: Muscle strength 5/5 to all lower extremity muscle groups bilaterally. No pain, crepitus or joint limitation noted with ROM bilateral LE. No gross bony deformities bilaterally.   Radiographs: None   Assessment:   1. Pain due to onychomycosis of toenails of both feet    Plan:  Patient was evaluated and treated and all questions answered. Consent given for treatment as described below: -No new findings. No new orders. -Mycotic toenails 1-5 bilaterally were debrided in length and girth with sterile nail nippers and dremel without incident. -Patient/POA to call should there be question/concern in the interim.  Return in about 3 months (around 08/31/2021).  Marzetta Board,  DPM

## 2021-06-17 DIAGNOSIS — E538 Deficiency of other specified B group vitamins: Secondary | ICD-10-CM | POA: Diagnosis not present

## 2021-06-29 DIAGNOSIS — M9902 Segmental and somatic dysfunction of thoracic region: Secondary | ICD-10-CM | POA: Diagnosis not present

## 2021-06-29 DIAGNOSIS — M5414 Radiculopathy, thoracic region: Secondary | ICD-10-CM | POA: Diagnosis not present

## 2021-06-30 DIAGNOSIS — M9902 Segmental and somatic dysfunction of thoracic region: Secondary | ICD-10-CM | POA: Diagnosis not present

## 2021-06-30 DIAGNOSIS — M5414 Radiculopathy, thoracic region: Secondary | ICD-10-CM | POA: Diagnosis not present

## 2021-07-05 ENCOUNTER — Other Ambulatory Visit: Payer: Self-pay | Admitting: Neurology

## 2021-07-05 DIAGNOSIS — M5414 Radiculopathy, thoracic region: Secondary | ICD-10-CM | POA: Diagnosis not present

## 2021-07-05 DIAGNOSIS — M9902 Segmental and somatic dysfunction of thoracic region: Secondary | ICD-10-CM | POA: Diagnosis not present

## 2021-07-06 ENCOUNTER — Other Ambulatory Visit: Payer: Self-pay

## 2021-07-06 ENCOUNTER — Encounter: Payer: Self-pay | Admitting: Cardiology

## 2021-07-06 ENCOUNTER — Ambulatory Visit: Payer: Medicare Other | Admitting: Cardiology

## 2021-07-06 DIAGNOSIS — D6869 Other thrombophilia: Secondary | ICD-10-CM

## 2021-07-06 DIAGNOSIS — I4811 Longstanding persistent atrial fibrillation: Secondary | ICD-10-CM

## 2021-07-06 DIAGNOSIS — I4891 Unspecified atrial fibrillation: Secondary | ICD-10-CM | POA: Diagnosis not present

## 2021-07-06 DIAGNOSIS — F0390 Unspecified dementia without behavioral disturbance: Secondary | ICD-10-CM | POA: Diagnosis not present

## 2021-07-06 NOTE — Progress Notes (Signed)
Cardiology Office Note:    Date:  07/06/2021   ID:  Daniel Chapman, DOB [birth year], MRN [id number]  PCP:  Johna Roles, PA   [facility] Providers Cardiologist:  Candee Furbish, MD     Referring MD: Johna Roles, [place]    History of Present Illness:    Daniel Chapman is a 88 y.o. male here for the follow-up of longstanding persistent atrial fibrillation chronic dyspnea from COPD ITP status post splenectomy stroke carotid artery disease with occluded right internal carotid left mild followed by VVS with dementia followed by neurology.  Prior note from August 2022 reviewed.  Atrial fibrillation started in 2018 after admission for stroke.  He was placed on oral anticoagulated and cardiovert 4 weeks later.  A-fib returned.  Rate control strategy.  EF was 70%.  Quite jovial.  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 09/2017 occ RICA, 1-39% L, occ prox LECA followed by VVS.   Chronic atrial fibrillation (HCC)    COPD (chronic obstructive pulmonary disease) ([place])    Dementia ([place])    Fall due to ice or snow 07/21/2013   Partial Right Hip replaced   History of ITP    Hx of colonic polyps    Hyperlipemia    Osteopenia    ?   Osteoporosis    dexa 06, declines further dexa or tx    Smoker    Stroke [facility])     Past Surgical History:  Procedure Laterality Date   BONE MARROW BIOPSY  05/2003   CARDIOVERSION N/A 03/22/2017   Procedure: CARDIOVERSION;  Surgeon: Acie Fredrickson Wonda Cheng, MD;  Location: [facility];  Service: Cardiovascular;  Laterality: N/A;   CATARACT EXTRACTION     Dexa     per patient ? osteopenia   HEMORRHOID SURGERY     JOINT REPLACEMENT Right Feb. 22, 2015   Partial Hip   SPLENECTOMY     TONSILLECTOMY AND ADENOIDECTOMY Bilateral age 73 per patient    Current Medications: Current Meds  Medication Sig   calcium carbonate (TUMS EX) 750 MG chewable tablet Chew 1 tablet by  mouth daily. Reported on 09/15/2015   cholecalciferol (VITAMIN D) 1000 UNITS tablet Take 1,000 Units by mouth daily. Reported on 09/15/2015   donepezil (ARICEPT) 10 MG tablet TAKE 1 TABLET BY MOUTH AT  BEDTIME   ELIQUIS 2.5 MG TABS tablet TAKE 1 TABLET BY MOUTH  TWICE DAILY   memantine (NAMENDA) 10 MG tablet TAKE 1 TABLET BY MOUTH  TWICE DAILY   rosuvastatin (CRESTOR) 5 MG tablet TAKE 1 TABLET BY MOUTH 3  TIMES WEEKLY ON MONDAY,  WEDNESDAY, AND FRIDAY     Allergies:   Patient has no known allergies.   Social History   Socioeconomic History   Marital status: Married    Spouse name: Not on file   Number of children: 6   Years of education: Not on file   Highest education level: Not on file  Occupational History   Occupation: Biochemist, clinical: SELF-EMPLOYED  Tobacco Use   Smoking status: Every Day    Packs/day: 0.50    Types: Cigarettes   Smokeless tobacco: Never  Vaping Use   Vaping Use: Never used  Substance and Sexual Activity   Alcohol use: No    Alcohol/week: 0.0 standard drinks   Drug use: No   Sexual activity: Not  Currently    Partners: Female  Other Topics Concern   Not on file  Social History Narrative   Lives with wife      Left handed      2 years of college      [employer]      Social Determinants of Health   Financial Resource Strain: Not on file  Food Insecurity: Not on file  Transportation Needs: Not on file  Physical Activity: Not on file  Stress: Not on file  Social Connections: Not on file     Family History: The patient's family history includes Heart disease in his sister; Uterine cancer in his mother. There is no history of Colon cancer, Stomach cancer, Pancreatic cancer, or Esophageal cancer.  ROS:   Please see the history of present illness.    No fevers chills nausea all other systems reviewed and are negative.  EKGs/Labs/Other Studies Reviewed:    The following studies were reviewed today:  2D echo 12/2016 - Left ventricle:  The cavity size was normal. Wall thickness was    normal. Systolic function was vigorous. The estimated ejection    fraction was in the range of 65% to 70%.  - Aortic valve: Moderately calcified annulus. Mildly thickened    leaflets.  - Mitral valve: There was mild regurgitation.    Monitor 05/2019 Persistent atrial fibrillation 100% of the time with average heart rate of 83 bpm. No significant pauses. Occasional tachycardia as high as 149 bpm. Transient. Occasional PVCs as well. Minimum heart rate 53 bpm-benign.   Overall reassuring monitor with no evidence of significant bradycardia or pauses. Candee Furbish, MD  EKG:  EKG is  ordered today.  The ekg ordered today demonstrates sinus rhythm 66  Recent Labs: 01/04/2021: ALT 8; BUN 17; Creatinine, Ser 0.72; Hemoglobin 12.7; Platelets 168; Potassium 4.1; Sodium 138  Recent Lipid Panel    Component Value Date/Time   CHOL 123 01/04/2021 1642   TRIG 215 (H) 01/04/2021 1642   HDL 28 (L) 01/04/2021 1642   CHOLHDL 4.4 01/04/2021 1642   CHOLHDL 3 01/11/2019 0908   VLDL 22.6 01/11/2019 0908   LDLCALC 60 01/04/2021 1642     Risk Assessment/Calculations:              Physical Exam:    VS:  BP 140/70 (BP Location: Left Arm, Patient Position: Sitting, Cuff Size: Normal)    Pulse 80    Ht _0  (1.702 m)    Wt 128 lb (58.1 kg)    BMI 20.05 kg/m     Wt Readings from Last 3 Encounters:  07/06/21 128 lb (58.1 kg)  01/18/21 121 lb 9.6 oz (55.2 kg)  01/04/21 121 lb 6.4 oz (55.1 kg)     GEN:  Well nourished, well developed in no acute distress HEENT: Normal NECK: No JVD; No carotid bruits LYMPHATICS: No lymphadenopathy CARDIAC: RRR, no murmurs, no rubs, gallops RESPIRATORY:  Clear to auscultation without rales, wheezing or rhonchi  ABDOMEN: Soft, non-tender, non-distended MUSCULOSKELETAL:  No edema; No deformity  SKIN: Warm and dry NEUROLOGIC:  Alert and oriented x 3 PSYCHIATRIC:  Normal affect   ASSESSMENT:    1. Longstanding  persistent atrial fibrillation ([place])   2. Hypercoagulable state due to atrial fibrillation ([place])   3. Dementia, unspecified dementia severity, unspecified dementia type, unspecified whether behavioral, psychotic, or mood disturbance or anxiety ([place])    PLAN:    In order of problems listed above:  Longstanding persistent atrial fibrillation ([place]) Stable on  rate control.  He is not on any AV nodal blocking agents.  On Eliquis.  Dose adjusted.  Hypercoagulable state due to atrial fibrillation (HCC) On Eliquis 2.5 mg twice a day.  Dose adjusted secondary to weight.  No recent falls.  Overall doing well.  Continue to monitor blood work for high risk medication.  Dementia ([place]) Seems to have progressed.  Continue to monitor.         Medication Adjustments/Labs and Tests Ordered: Current medicines are reviewed at length with the patient today.  Concerns regarding medicines are outlined above.  No orders of the defined types were placed in this encounter.  No orders of the defined types were placed in this encounter.   Patient Instructions  Medication Instructions:  Your physician recommends that you continue on your current medications as directed. Please refer to the Current Medication list given to you today.  *If you need a refill on your cardiac medications before your next appointment, please call your pharmacy*   Lab Work: NONE ORDERED If you have labs (blood work) drawn today and your tests are completely normal, you will receive your results only by: [place] (if you have MyChart) OR A paper copy in the mail If you have any lab test that is abnormal or we need to change your treatment, we will call you to review the results.   Testing/Procedures: NONE ORDERED   Follow-Up: At [facility], you and your health needs are our priority.  As part of our continuing mission to provide you with exceptional heart care, we have created designated Provider Care Teams.   These Care Teams include your primary Cardiologist (physician) and Advanced Practice Providers (APPs -  Physician Assistants and Nurse Practitioners) who all work together to provide you with the care you need, when you need it.  We recommend signing up for the patient portal called "MyChart".  Sign up information is provided on this After Visit Summary.  MyChart is used to connect with patients for Virtual Visits (Telemedicine).  Patients are able to view lab/test results, encounter notes, upcoming appointments, etc.  Non-urgent messages can be sent to your provider as well.   To learn more about what you can do with MyChart, go to [email or web address].    Your next appointment:   1 year(s)  The format for your next appointment:   In Person  Provider:   Candee Furbish, MD      Other Instructions     Signed, Candee Furbish, MD  07/06/2021 5:04 PM    [place]

## 2021-07-06 NOTE — Patient Instructions (Signed)
Medication Instructions:  Your physician recommends that you continue on your current medications as directed. Please refer to the Current Medication list given to you today.  *If you need a refill on your cardiac medications before your next appointment, please call your pharmacy*   Lab Work: NONE ORDERED If you have labs (blood work) drawn today and your tests are completely normal, you will receive your results only by: [place] (if you have MyChart) OR A paper copy in the mail If you have any lab test that is abnormal or we need to change your treatment, we will call you to review the results.   Testing/Procedures: NONE ORDERED   Follow-Up: At [facility], you and your health needs are our priority.  As part of our continuing mission to provide you with exceptional heart care, we have created designated Provider Care Teams.  These Care Teams include your primary Cardiologist (physician) and Advanced Practice Providers (APPs -  Physician Assistants and Nurse Practitioners) who all work together to provide you with the care you need, when you need it.  We recommend signing up for the patient portal called "MyChart".  Sign up information is provided on this After Visit Summary.  MyChart is used to connect with patients for Virtual Visits (Telemedicine).  Patients are able to view lab/test results, encounter notes, upcoming appointments, etc.  Non-urgent messages can be sent to your provider as well.   To learn more about what you can do with MyChart, go to [email or web address].    Your next appointment:   1 year(s)  The format for your next appointment:   In Person  Provider:   Candee Furbish, MD      Other Instructions

## 2021-07-06 NOTE — Assessment & Plan Note (Signed)
Seems to have progressed.  Continue to monitor.

## 2021-07-06 NOTE — Assessment & Plan Note (Signed)
Stable on rate control.  He is not on any AV nodal blocking agents.  On Eliquis.  Dose adjusted.

## 2021-07-06 NOTE — Assessment & Plan Note (Signed)
On Eliquis 2.5 mg twice a day.  Dose adjusted secondary to weight.  No recent falls.  Overall doing well.  Continue to monitor blood work for high risk medication.

## 2021-07-07 DIAGNOSIS — M9902 Segmental and somatic dysfunction of thoracic region: Secondary | ICD-10-CM | POA: Diagnosis not present

## 2021-07-07 DIAGNOSIS — M5414 Radiculopathy, thoracic region: Secondary | ICD-10-CM | POA: Diagnosis not present

## 2021-07-12 DIAGNOSIS — M5414 Radiculopathy, thoracic region: Secondary | ICD-10-CM | POA: Diagnosis not present

## 2021-07-12 DIAGNOSIS — M9902 Segmental and somatic dysfunction of thoracic region: Secondary | ICD-10-CM | POA: Diagnosis not present

## 2021-07-14 DIAGNOSIS — M5414 Radiculopathy, thoracic region: Secondary | ICD-10-CM | POA: Diagnosis not present

## 2021-07-14 DIAGNOSIS — M9902 Segmental and somatic dysfunction of thoracic region: Secondary | ICD-10-CM | POA: Diagnosis not present

## 2021-07-19 DIAGNOSIS — M5414 Radiculopathy, thoracic region: Secondary | ICD-10-CM | POA: Diagnosis not present

## 2021-07-19 DIAGNOSIS — M9902 Segmental and somatic dysfunction of thoracic region: Secondary | ICD-10-CM | POA: Diagnosis not present

## 2021-07-21 DIAGNOSIS — M9902 Segmental and somatic dysfunction of thoracic region: Secondary | ICD-10-CM | POA: Diagnosis not present

## 2021-07-21 DIAGNOSIS — M5414 Radiculopathy, thoracic region: Secondary | ICD-10-CM | POA: Diagnosis not present

## 2021-07-26 DIAGNOSIS — I4819 Other persistent atrial fibrillation: Secondary | ICD-10-CM | POA: Diagnosis not present

## 2021-07-26 DIAGNOSIS — D7589 Other specified diseases of blood and blood-forming organs: Secondary | ICD-10-CM | POA: Diagnosis not present

## 2021-07-26 DIAGNOSIS — D510 Vitamin B12 deficiency anemia due to intrinsic factor deficiency: Secondary | ICD-10-CM | POA: Diagnosis not present

## 2021-07-26 DIAGNOSIS — M5414 Radiculopathy, thoracic region: Secondary | ICD-10-CM | POA: Diagnosis not present

## 2021-07-26 DIAGNOSIS — F17200 Nicotine dependence, unspecified, uncomplicated: Secondary | ICD-10-CM | POA: Diagnosis not present

## 2021-07-26 DIAGNOSIS — F01B0 Vascular dementia, moderate, without behavioral disturbance, psychotic disturbance, mood disturbance, and anxiety: Secondary | ICD-10-CM | POA: Diagnosis not present

## 2021-07-26 DIAGNOSIS — M9902 Segmental and somatic dysfunction of thoracic region: Secondary | ICD-10-CM | POA: Diagnosis not present

## 2021-07-26 DIAGNOSIS — J449 Chronic obstructive pulmonary disease, unspecified: Secondary | ICD-10-CM | POA: Diagnosis not present

## 2021-07-26 DIAGNOSIS — R1310 Dysphagia, unspecified: Secondary | ICD-10-CM | POA: Diagnosis not present

## 2021-07-28 DIAGNOSIS — M9902 Segmental and somatic dysfunction of thoracic region: Secondary | ICD-10-CM | POA: Diagnosis not present

## 2021-07-28 DIAGNOSIS — M5414 Radiculopathy, thoracic region: Secondary | ICD-10-CM | POA: Diagnosis not present

## 2021-08-02 DIAGNOSIS — M5414 Radiculopathy, thoracic region: Secondary | ICD-10-CM | POA: Diagnosis not present

## 2021-08-02 DIAGNOSIS — M9902 Segmental and somatic dysfunction of thoracic region: Secondary | ICD-10-CM | POA: Diagnosis not present

## 2021-08-09 DIAGNOSIS — M9903 Segmental and somatic dysfunction of lumbar region: Secondary | ICD-10-CM | POA: Diagnosis not present

## 2021-08-09 DIAGNOSIS — M47816 Spondylosis without myelopathy or radiculopathy, lumbar region: Secondary | ICD-10-CM | POA: Diagnosis not present

## 2021-08-10 ENCOUNTER — Other Ambulatory Visit: Payer: Self-pay | Admitting: Neurology

## 2021-08-10 DIAGNOSIS — M9903 Segmental and somatic dysfunction of lumbar region: Secondary | ICD-10-CM | POA: Diagnosis not present

## 2021-08-10 DIAGNOSIS — M47816 Spondylosis without myelopathy or radiculopathy, lumbar region: Secondary | ICD-10-CM | POA: Diagnosis not present

## 2021-08-10 NOTE — Telephone Encounter (Signed)
No further refills ntbs for 8 month follow up ?

## 2021-08-12 DIAGNOSIS — M47816 Spondylosis without myelopathy or radiculopathy, lumbar region: Secondary | ICD-10-CM | POA: Diagnosis not present

## 2021-08-12 DIAGNOSIS — M9903 Segmental and somatic dysfunction of lumbar region: Secondary | ICD-10-CM | POA: Diagnosis not present

## 2021-08-16 DIAGNOSIS — M9903 Segmental and somatic dysfunction of lumbar region: Secondary | ICD-10-CM | POA: Diagnosis not present

## 2021-08-16 DIAGNOSIS — M47816 Spondylosis without myelopathy or radiculopathy, lumbar region: Secondary | ICD-10-CM | POA: Diagnosis not present

## 2021-08-17 DIAGNOSIS — M9903 Segmental and somatic dysfunction of lumbar region: Secondary | ICD-10-CM | POA: Diagnosis not present

## 2021-08-17 DIAGNOSIS — M47816 Spondylosis without myelopathy or radiculopathy, lumbar region: Secondary | ICD-10-CM | POA: Diagnosis not present

## 2021-08-19 DIAGNOSIS — M47816 Spondylosis without myelopathy or radiculopathy, lumbar region: Secondary | ICD-10-CM | POA: Diagnosis not present

## 2021-08-19 DIAGNOSIS — M9903 Segmental and somatic dysfunction of lumbar region: Secondary | ICD-10-CM | POA: Diagnosis not present

## 2021-08-21 DIAGNOSIS — M47816 Spondylosis without myelopathy or radiculopathy, lumbar region: Secondary | ICD-10-CM | POA: Diagnosis not present

## 2021-08-21 DIAGNOSIS — M9903 Segmental and somatic dysfunction of lumbar region: Secondary | ICD-10-CM | POA: Diagnosis not present

## 2021-08-23 ENCOUNTER — Other Ambulatory Visit: Payer: Self-pay | Admitting: Physician Assistant

## 2021-08-23 DIAGNOSIS — R1310 Dysphagia, unspecified: Secondary | ICD-10-CM

## 2021-08-24 DIAGNOSIS — M47816 Spondylosis without myelopathy or radiculopathy, lumbar region: Secondary | ICD-10-CM | POA: Diagnosis not present

## 2021-08-24 DIAGNOSIS — M9903 Segmental and somatic dysfunction of lumbar region: Secondary | ICD-10-CM | POA: Diagnosis not present

## 2021-08-25 DIAGNOSIS — F17210 Nicotine dependence, cigarettes, uncomplicated: Secondary | ICD-10-CM | POA: Diagnosis not present

## 2021-08-25 DIAGNOSIS — D510 Vitamin B12 deficiency anemia due to intrinsic factor deficiency: Secondary | ICD-10-CM | POA: Diagnosis not present

## 2021-08-25 DIAGNOSIS — M818 Other osteoporosis without current pathological fracture: Secondary | ICD-10-CM | POA: Diagnosis not present

## 2021-08-25 DIAGNOSIS — M545 Low back pain, unspecified: Secondary | ICD-10-CM | POA: Diagnosis not present

## 2021-08-25 DIAGNOSIS — G8929 Other chronic pain: Secondary | ICD-10-CM | POA: Diagnosis not present

## 2021-08-25 DIAGNOSIS — F01B18 Vascular dementia, moderate, with other behavioral disturbance: Secondary | ICD-10-CM | POA: Diagnosis not present

## 2021-08-26 ENCOUNTER — Telehealth (HOSPITAL_COMMUNITY): Payer: Self-pay | Admitting: *Deleted

## 2021-08-26 NOTE — Telephone Encounter (Signed)
Attempted to contact patient to schedule OP MBS. I was unable to leave VM on either phone number in chart. Will attempt again as time allows. RKEEL ?

## 2021-08-31 ENCOUNTER — Other Ambulatory Visit: Payer: Self-pay | Admitting: Cardiology

## 2021-08-31 ENCOUNTER — Ambulatory Visit: Payer: Medicare Other | Admitting: Podiatry

## 2021-08-31 DIAGNOSIS — M79674 Pain in right toe(s): Secondary | ICD-10-CM | POA: Diagnosis not present

## 2021-08-31 DIAGNOSIS — B351 Tinea unguium: Secondary | ICD-10-CM | POA: Diagnosis not present

## 2021-08-31 DIAGNOSIS — R1310 Dysphagia, unspecified: Secondary | ICD-10-CM | POA: Insufficient documentation

## 2021-08-31 DIAGNOSIS — I4891 Unspecified atrial fibrillation: Secondary | ICD-10-CM

## 2021-08-31 DIAGNOSIS — M79675 Pain in left toe(s): Secondary | ICD-10-CM

## 2021-08-31 DIAGNOSIS — G8929 Other chronic pain: Secondary | ICD-10-CM | POA: Insufficient documentation

## 2021-08-31 DIAGNOSIS — Z8501 Personal history of malignant neoplasm of esophagus: Secondary | ICD-10-CM | POA: Insufficient documentation

## 2021-09-01 ENCOUNTER — Other Ambulatory Visit: Payer: Self-pay | Admitting: Physician Assistant

## 2021-09-01 ENCOUNTER — Ambulatory Visit
Admission: RE | Admit: 2021-09-01 | Discharge: 2021-09-01 | Disposition: A | Discharge status: Home / Self Care | Payer: Medicare Other | Source: Ambulatory Visit | Attending: Physician Assistant | Admitting: Physician Assistant

## 2021-09-01 DIAGNOSIS — K224 Dyskinesia of esophagus: Secondary | ICD-10-CM | POA: Diagnosis not present

## 2021-09-01 DIAGNOSIS — R1310 Dysphagia, unspecified: Secondary | ICD-10-CM

## 2021-09-01 NOTE — Telephone Encounter (Signed)
Eliquis '5mg'$  refill request received. Patient is 88 years old, weight-58.1kg, Crea-0.72 on 01/04/2021, Diagnosis-Afib, and last seen by Dr. Marlou Porch on 07/06/2021. Dose is appropriate based on dosing criteria. Will send in refill to requested pharmacy.   ?

## 2021-09-05 NOTE — Progress Notes (Signed)
?  Subjective:  ?Patient ID: Daniel Chapman, male    DOB: [birth year],  MRN: [id number] ? ?Daniel Chapman presents to clinic today for painful thick toenails that are difficult to trim. Pain interferes with ambulation. Aggravating factors include wearing enclosed shoe gear. Pain is relieved with periodic professional debridement. ? ?New problem(s): None.  ? ?PCP is Johna Roles, [place] , and last visit was August 25, 2021. ? ?No Known Allergies ? ?Review of Systems: Negative except as noted in the HPI. ? ?Objective: No changes noted in today's physical examination. ?Constitutional Daniel Chapman is a pleasant 88 y.o. Caucasian male, WD, WN in NAD. AAO x 3.   ?Vascular CFT <3 seconds b/l LE. Faintly palpable pedal pulses b/l LE. Pedal hair absent b/l LE. Skin temperature gradient WNL b/l. No pain with calf compression b/l. No edema b/l LE. No cyanosis or clubbing noted b/l LE.  ?Neurologic Normal speech. Oriented to person, place, and time. Protective sensation intact 5/5 intact bilaterally with 10g monofilament b/l. Vibratory sensation intact b/l.  ?Dermatologic Pedal skin thin, shiny and atrophic b/l LE. Toenails 1-5 b/l well maintained with adequate length. No erythema, no edema, no drainage, no fluctuance.  ?Orthopedic: Muscle strength 5/5 to all lower extremity muscle groups bilaterally. No pain, crepitus or joint limitation noted with ROM bilateral LE. No gross bony deformities bilaterally.  ? ?Radiographs: None ? ?Assessment/Plan: ?1. Pain due to onychomycosis of toenails of both feet   ?  ?-Examined patient. ?-Patient to continue soft, supportive shoe gear daily. ?-Toenails 1-5 b/l were debrided in length and girth with sterile nail nippers and dremel without iatrogenic bleeding.  ?-Patient/POA to call should there be question/concern in the interim.  ? ?Return in about 3 months (around 11/30/2021). ? ?Marzetta Board, DPM  ?

## 2021-09-09 DIAGNOSIS — K5900 Constipation, unspecified: Secondary | ICD-10-CM | POA: Diagnosis not present

## 2021-09-09 DIAGNOSIS — R000 Tachycardia, unspecified: Secondary | ICD-10-CM | POA: Diagnosis not present

## 2021-09-10 ENCOUNTER — Emergency Department (HOSPITAL_COMMUNITY)
Admission: EM | Admit: 2021-09-10 | Discharge: 2021-09-10 | Disposition: A | Discharge status: Home / Self Care | Payer: Medicare Other | Attending: Emergency Medicine | Admitting: Emergency Medicine

## 2021-09-10 ENCOUNTER — Other Ambulatory Visit: Payer: Self-pay | Admitting: Physician Assistant

## 2021-09-10 ENCOUNTER — Emergency Department (HOSPITAL_COMMUNITY): Payer: Medicare Other

## 2021-09-10 ENCOUNTER — Ambulatory Visit
Admission: RE | Admit: 2021-09-10 | Discharge: 2021-09-10 | Disposition: A | Discharge status: Home / Self Care | Payer: Medicare Other | Source: Ambulatory Visit | Attending: Physician Assistant | Admitting: Physician Assistant

## 2021-09-10 ENCOUNTER — Encounter (HOSPITAL_COMMUNITY): Payer: Self-pay

## 2021-09-10 DIAGNOSIS — F0390 Unspecified dementia without behavioral disturbance: Secondary | ICD-10-CM | POA: Insufficient documentation

## 2021-09-10 DIAGNOSIS — K5900 Constipation, unspecified: Secondary | ICD-10-CM | POA: Diagnosis not present

## 2021-09-10 DIAGNOSIS — J449 Chronic obstructive pulmonary disease, unspecified: Secondary | ICD-10-CM | POA: Diagnosis not present

## 2021-09-10 DIAGNOSIS — E860 Dehydration: Secondary | ICD-10-CM | POA: Diagnosis not present

## 2021-09-10 DIAGNOSIS — Z7901 Long term (current) use of anticoagulants: Secondary | ICD-10-CM | POA: Insufficient documentation

## 2021-09-10 DIAGNOSIS — K5730 Diverticulosis of large intestine without perforation or abscess without bleeding: Secondary | ICD-10-CM | POA: Diagnosis not present

## 2021-09-10 DIAGNOSIS — I700 Atherosclerosis of aorta: Secondary | ICD-10-CM | POA: Diagnosis not present

## 2021-09-10 LAB — CBC
HCT: 44.1 % (ref 39.0–52.0)
Hemoglobin: 13.9 g/dL (ref 13.0–17.0)
MCH: 27 pg (ref 26.0–34.0)
MCHC: 31.5 g/dL (ref 30.0–36.0)
MCV: 85.6 fL (ref 80.0–100.0)
Platelets: 122 10*3/uL — ABNORMAL LOW (ref 150–400)
RBC: 5.15 MIL/uL (ref 4.22–5.81)
RDW: 18.6 % — ABNORMAL HIGH (ref 11.5–15.5)
WBC: 8.5 10*3/uL (ref 4.0–10.5)
nRBC: 0.2 % (ref 0.0–0.2)

## 2021-09-10 LAB — COMPREHENSIVE METABOLIC PANEL
ALT: 13 U/L (ref 0–44)
AST: 25 U/L (ref 15–41)
Albumin: 3.2 g/dL — ABNORMAL LOW (ref 3.5–5.0)
Alkaline Phosphatase: 102 U/L (ref 38–126)
Anion gap: 11 (ref 5–15)
BUN: 31 mg/dL — ABNORMAL HIGH (ref 8–23)
CO2: 24 mmol/L (ref 22–32)
Calcium: 9.3 mg/dL (ref 8.9–10.3)
Chloride: 100 mmol/L (ref 98–111)
Creatinine, Ser: 1.27 mg/dL — ABNORMAL HIGH (ref 0.61–1.24)
GFR, Estimated: 55 mL/min — ABNORMAL LOW (ref >60)
Glucose, Bld: 99 mg/dL (ref 70–99)
Potassium: 3.7 mmol/L (ref 3.5–5.1)
Sodium: 135 mmol/L (ref 135–145)
Total Bilirubin: 0.8 mg/dL (ref 0.3–1.2)
Total Protein: 7.7 g/dL (ref 6.5–8.1)

## 2021-09-10 LAB — LIPASE, BLOOD: Lipase: 31 U/L (ref 11–51)

## 2021-09-10 MED ORDER — SODIUM CHLORIDE 0.9 % IV BOLUS
1000.0000 mL | Freq: Once | INTRAVENOUS | Status: AC
Start: 2021-09-10 — End: 2021-09-10
  Administered 2021-09-10: 1000 mL via INTRAVENOUS

## 2021-09-10 MED ORDER — IOHEXOL 300 MG/ML  SOLN
100.0000 mL | Freq: Once | INTRAMUSCULAR | Status: AC | PRN
  Administered 2021-09-10: 100 mL via INTRAVENOUS

## 2021-09-10 NOTE — ED Notes (Signed)
Pt provided discharge instructions and prescription information. Pt was given the opportunity to ask questions and questions were answered. Discharge signature not obtained in the setting of the COVID-19 pandemic in order to reduce high touch surfaces.  ° °

## 2021-09-10 NOTE — ED Provider Notes (Signed)
?[facility] ?Provider Note ? ? ?CSN: [id number] ?Arrival date & time: 09/10/21  1349 ? ?  ? ?History ? ?Chief Complaint  ?Patient presents with  ? Constipation  ? Dehydration  ? ? ?Daniel Chapman is a 88 y.o. male. ? ?Level 5 caveat due to dementia.  Here with constipation and concern for dehydration.  Has not had a bowel movement in the last 2 weeks.  Maybe not passing gas.  Decreased p.o. intake but baseline for patient.  Denies any fevers or chills.  No nausea or vomiting.  Has been trying enemas with no improvement.  Started MiraLAX yesterday but only 1 scoop. ? ?The history is provided by a caregiver.  ?Constipation ?Severity:  Mild ?Timing:  Constant ?Progression:  Unchanged ?Chronicity:  New ?Context: dehydration   ?Stool description:  None produced ?Relieved by:  Nothing ?Worsened by:  Nothing ?Ineffective treatments:  Enemas ?Associated symptoms: no abdominal pain   ? ?  ? ?Home Medications ?Prior to Admission medications   ?Medication Sig Start Date End Date Taking? Authorizing Provider  ?apixaban (ELIQUIS) 2.5 MG TABS tablet TAKE 1 TABLET BY MOUTH  TWICE DAILY 09/01/21   Jerline Pain, MD  ?calcium carbonate (TUMS EX) 750 MG chewable tablet Chew 1 tablet by mouth daily. Reported on 09/15/2015    [provider]  ?cetirizine (ZYRTEC) 10 MG tablet 1 tablet    [provider]  ?cholecalciferol (VITAMIN D) 1000 UNITS tablet Take 1,000 Units by mouth daily. Reported on 09/15/2015    [provider]  ?Cholecalciferol (VITAMIN D3) 50 MCG (2000 UT) CAPS 1 capsule    [provider]  ?Cod Liver Oil CAPS See admin instructions.    [provider]  ?cyanocobalamin (,VITAMIN B-12,) 1000 MCG/ML injection SMARTSIG:1 injection IM Once a Month 08/16/21   [provider]  ?donepezil (ARICEPT) 10 MG tablet TAKE 1 TABLET BY MOUTH AT  BEDTIME 03/15/21   Cameron Sprang, MD  ?memantine (NAMENDA) 10 MG tablet TAKE 1 TABLET BY MOUTH TWICE   DAILY 08/10/21   Cameron Sprang, MD  ?memantine (NAMENDA) 10 MG tablet 1 tablet    [provider]  ?rosuvastatin (CRESTOR) 5 MG tablet TAKE 1 TABLET BY MOUTH 3  TIMES WEEKLY ON Laurine Blazer, AND FRIDAY 05/03/21   Jerline Pain, MD  ?   ? ?Allergies    ?Patient has no known allergies.   ? ?Review of Systems   ?Review of Systems  ?Gastrointestinal:  Positive for constipation. Negative for abdominal pain.  ? ?Physical Exam ?Updated Vital Signs ?BP (!) 171/108   Pulse 60   Temp 98.7 ?F (37.1 ?C)   Resp 16   Ht '5\' 7"'$  (1.702 m)   Wt 58 kg   SpO2 97%   BMI 20.03 kg/m?  ?Physical Exam ?Vitals and nursing note reviewed.  ?Constitutional:   ?   General: He is not in acute distress. ?   Appearance: He is well-developed. He is not ill-appearing.  ?HENT:  ?   Head: Normocephalic and atraumatic.  ?   Nose: Nose normal.  ?   Mouth/Throat:  ?   Mouth: Mucous membranes are moist.  ?Eyes:  ?   Conjunctiva/sclera: Conjunctivae normal.  ?   Pupils: Pupils are equal, round, and reactive to light.  ?Cardiovascular:  ?   Rate and Rhythm: Normal rate and regular rhythm.  ?   Pulses: Normal pulses.  ?   Heart sounds: No murmur heard. ?  Pulmonary:  ?   Effort: Pulmonary effort is normal. No respiratory distress.  ?   Breath sounds: Normal breath sounds.  ?Abdominal:  ?   General: There is no distension.  ?   Palpations: Abdomen is soft.  ?   Tenderness: There is no abdominal tenderness.  ?Musculoskeletal:     ?   General: No swelling. Normal range of motion.  ?   Cervical back: Neck supple.  ?Skin: ?   General: Skin is warm and dry.  ?   Capillary Refill: Capillary refill takes less than 2 seconds.  ?Neurological:  ?   General: No focal deficit present.  ?   Mental Status: He is alert.  ?Psychiatric:     ?   Mood and Affect: Mood normal.  ? ? ?ED Results / Procedures / Treatments   ?Labs ?(all labs ordered are listed, but only abnormal results are displayed) ?Labs Reviewed  ?COMPREHENSIVE METABOLIC PANEL - Abnormal;  Notable for the following components:  ?    Result Value  ? BUN 31 (*)   ? Creatinine, Ser 1.27 (*)   ? Albumin 3.2 (*)   ? GFR, Estimated 55 (*)   ? All other components within normal limits  ?CBC - Abnormal; Notable for the following components:  ? RDW 18.6 (*)   ? Platelets 122 (*)   ? All other components within normal limits  ?LIPASE, BLOOD  ? ? ?EKG ?None ? ?Radiology ?DG Abd 1 View ? ?Result Date: 09/10/2021 ?CLINICAL DATA:  Constipation EXAM: ABDOMEN - 1 VIEW COMPARISON:  None. FINDINGS: Bowel gas pattern is nonspecific. Moderate to large amount of stool is seen in the colon. There is no fecal impaction in the rectum. Small amount of stool is seen in the sigmoid colon. No abnormal masses are seen. There is 6 mm linear calcific density overlying the upper pole left kidney. There is previous right hip arthroplasty. IMPRESSION: Moderate to large amount of stool is seen in the colon, especially in the transverse colon. There is no fecal impaction in the rectum. 6 mm linear calcific density overlying the upper pole of left kidney may suggest renal stone or vascular calcification. Electronically Signed   By: Elmer Picker M.D.   On: 09/10/2021 11:31  ? ?CT ABDOMEN PELVIS W CONTRAST ? ?Result Date: 09/10/2021 ?CLINICAL DATA:  Bowel obstruction suspected Patient reports no bowel movement for 2 weeks. EXAM: CT ABDOMEN AND PELVIS WITH CONTRAST TECHNIQUE: Multidetector CT imaging of the abdomen and pelvis was performed using the standard protocol following bolus administration of intravenous contrast. RADIATION DOSE REDUCTION: This exam was performed according to the departmental dose-optimization program which includes automated exposure control, adjustment of the mA and/or kV according to patient size and/or use of iterative reconstruction technique. CONTRAST:  145m OMNIPAQUE IOHEXOL 300 MG/ML  SOLN COMPARISON:  Radiograph earlier today. Included portion from prior chest CTs reviewed. FINDINGS: Lower chest:  Emphysema. Stable 5 x 10 mm right lower lobe nodule series 5, image 5. This is unchanged from 2018 exam and considered benign. 6 mm subpleural left lower lobe nodule series 5, image 20 also unchanged from 2018 and considered benign. Additional tiny nodules at the lung bases are also unchanged. No further follow-up is needed. Hepatobiliary: Possible subjective hepatic steatosis. No focal hepatic abnormality. Partially distended gallbladder. No gallstone or pericholecystic fat stranding. No biliary dilatation. Pancreas: No ductal dilatation or inflammation. Spleen: Surgically absent. Adrenals/Urinary Tract: No adrenal nodule. No hydronephrosis or perinephric edema. Homogeneous renal enhancement with symmetric  excretion on delayed phase imaging. No visible renal calculi. Subcentimeter hypodense lesions in the left kidney are too small to characterize but likely cysts, no dedicated further follow-up recommended. Majority of the urinary bladder is obscured by streak artifact from right hip arthroplasty. Lower most aspect of the bladder is unremarkable. The bladder is minimally distended. Stomach/Bowel: The stomach is decompressed there is no small bowel obstruction. No small bowel inflammation. The cecum is low-lying in the pelvis. The appendix is tentatively but not definitively visualized, regardless, no appendicitis. Moderate formed stool in the ascending and transverse colon. There is retained barium within the distal transverse and descending colon from esophagram 09/01/2021. Barium is mixed with small volume of formed colonic stool. Diverticulosis in the sigmoid colon. Portions of the sigmoid colon are obscured by streak artifact from right hip arthroplasty. No diverticulitis. There is no colonic wall thickening, evidence of colonic mass, or colonic obstruction. Vascular/Lymphatic: Moderate aortic atherosclerosis without aneurysm. The portal vein is patent, mixed with unopacified 8 venous blood in the SMV. No  abdominopelvic adenopathy. Reproductive: Prostatic calcifications. Other: No free air, free fluid, or intra-abdominal fluid collection. Small fat containing umbilical hernia. No omental thickening. Musculoskeletal: Mild

## 2021-09-10 NOTE — Discharge Instructions (Signed)
Recommend continued use of MiraLAX for constipation. ?

## 2021-09-10 NOTE — ED Triage Notes (Signed)
Pt arrives POV w/ wife for eval of constipation and dehydration. Sent from [facility] for IV fluids. Pt wife reports no BM for nearly 2 weeks. Wife reports 2 enemas last night w/ no result. Pt and wife reports he is not passing gas.  ?

## 2021-09-10 NOTE — ED Provider Triage Note (Signed)
Emergency Medicine Provider Triage Evaluation Note ? ?Daniel Chapman , a 88 y.o. male  was evaluated in triage.  Pt complains of dehydration and constipation.  He has not had a bowel movement in almost 2 weeks, and is not passing any gas.  He was seen at his PCP today and they had concern for dehydration and sent him here. ? ?Patient sees [facility], I do not see any lab work or imaging via their note/care everywhere. ? ?Review of Systems  ?Positive: Constipation, generalized weakness, obstipation ?Negative: Fever, vomiting ? ?Physical Exam  ?BP (!) 129/99 (BP Location: Right Arm)   Pulse 99   Temp 98.7 ?F (37.1 ?C)   Resp 16   Ht '5\' 7"'$  (1.702 m)   Wt 58 kg   SpO2 98%   BMI 20.03 kg/m?  ?Gen:   Awake, no distress   ?Resp:  Normal effort  ?MSK:   Moves extremities without difficulty  ?Other:   ? ?Medical Decision Making  ?Medically screening exam initiated at 2:04 PM.  Appropriate orders placed.  Daniel Chapman was informed that the remainder of the evaluation will be completed by another provider, this initial triage assessment does not replace that evaluation, and the importance of remaining in the ED until their evaluation is complete. ? ? ?  ?Kateri Plummer, PA-C ?09/10/21 1404 ? ?

## 2021-09-15 ENCOUNTER — Other Ambulatory Visit (HOSPITAL_COMMUNITY): Payer: Self-pay

## 2021-09-15 ENCOUNTER — Telehealth: Payer: Self-pay

## 2021-09-15 DIAGNOSIS — R1310 Dysphagia, unspecified: Secondary | ICD-10-CM

## 2021-09-15 NOTE — Telephone Encounter (Signed)
Spoke with patient's spouse [name] and scheduled a telephonic Palliative Consult for 09/23/21 @ 2:30 PM. ? ?Consent obtained; updated Netsmart, Team List and Epic.  ? ? ?

## 2021-09-16 DIAGNOSIS — E538 Deficiency of other specified B group vitamins: Secondary | ICD-10-CM | POA: Diagnosis not present

## 2021-09-23 ENCOUNTER — Other Ambulatory Visit: Payer: Self-pay | Admitting: Hospice

## 2021-09-23 DIAGNOSIS — K5901 Slow transit constipation: Secondary | ICD-10-CM

## 2021-09-23 DIAGNOSIS — R1310 Dysphagia, unspecified: Secondary | ICD-10-CM

## 2021-09-23 DIAGNOSIS — F0150 Vascular dementia without behavioral disturbance: Secondary | ICD-10-CM

## 2021-09-23 DIAGNOSIS — E440 Moderate protein-calorie malnutrition: Secondary | ICD-10-CM

## 2021-09-23 DIAGNOSIS — Z8501 Personal history of malignant neoplasm of esophagus: Secondary | ICD-10-CM

## 2021-09-23 DIAGNOSIS — Z515 Encounter for palliative care: Secondary | ICD-10-CM

## 2021-09-23 NOTE — Progress Notes (Signed)
? ? ?Manufacturing engineer ?Community Palliative Care Consult Note ?Telephone: [phone]  ?Fax: [phone] ? ?PATIENT NAME: Daniel Chapman ?[place] [id number] ?[phone] (home)  ?DOB: 11/13/1931 ?MRN: [id number] ? ?PRIMARY CARE PROVIDER:    ?Johna Roles, PA,  ?[address] ?[place] [address] ?[phone] ? ?REFERRING PROVIDER:   ?Johna Roles, [place] ?[address] ?[place],  [place] [address] ?[phone] ? ?RESPONSIBLE PARTY:   [name] ?Contact Information   ? ? Name Relation Home Work Mobile  ? Wilder, Kurowski Spouse [phone]  [phone]  ? Komsa,Cindy Daughter   [phone]  ? ?  ? ? ?TELEHEALTH VISIT STATEMENT ?Due to the COVID-19 crisis, this visit was done via telemedicine from my office and it was initiated and consent by this patient and or family.  ?I connected with patient OR PROXY by a telephone/video  and verified that I am speaking with the correct person. I discussed the limitations of evaluation and management by telemedicine. The patient expressed understanding and agreed to proceed. ?Palliative Care was asked to follow this patient to address advance care planning, complex medical decision making and goals of care clarification. This is the initial visit.  ? ?  ASSESSMENT AND / RECOMMENDATIONS:  ? ?Advance Care Planning: Our advance care planning conversation included a discussion about:    ?The value and importance of advance care planning  ?Difference between Hospice and Palliative care ?Exploration of goals of care in the event of a sudden injury or illness  ?Identification and preparation of a healthcare agent  ?Review and updating or creation of an  advance directive document . ?Decision not to resuscitate or to de-escalate disease focused treatments due to poor prognosis. ? ?CODE STATUS: Patient is a full code.  [name] is open to further discuss and reconsider. ? ?Goals of Care: Goals include to maximize quality  of life and symptom management ? ?I spent 16  minutes providing this initial consultation. More than 50% of the time in this consultation was spent on counseling patient and coordinating communication. ?-------------------------------------------------------------------------------------------------------------------------------------- ? ?Symptom Management/Plan: ?Vascular Dementia: related to stroke 4 years ago.  Progressive memory loss/confusion in line with Dementia disease trajectory.  Bladder/bowel incontinence, impoverished thoughts.  FAST 6D.  Encourage reminiscence, word search/puzzles.  Optimize ongoing supportive care.  ?Esophageal CA:s/p surgical intervention, radiation. No chemo/radiation at this time.  ?Dysphagia: Encouraged mechanical soft. ST following, plan for swallow test next week.  ?Constipation: Managed with Miralax, [place].  ?Protein caloric malnutrition: Ongoing with loss Height/Weight: 5 feet 7 inches/115 Ib. BMI 18.01. Albumin is 3.2 09/10/21 down from 3.8 01/04/22.  Provide assistance during meals to ensure adequate oral intake.  Ensure BID ? ? ?Follow up: Palliative care will continue to follow for complex medical decision making, advance care planning, and clarification of goals. Return 6 weeks or prn. Encouraged to call provider sooner with any concerns.  ? ?Family /Caregiver/Community Supports: Patient lives at home with his spouse who is his primary caregiver.  ? ?HOSPICE ELIGIBILITY/DIAGNOSIS: TBD ? ?Chief Complaint: Initial Palliative care visit ? ?HISTORY OF PRESENT ILLNESS:  Daniel Chapman is a 88 y.o. year old male  with multiple morbidities requiring close monitoring and with high risk of complications and  mortality: Vascular dementia, stroke, esophageal cancer, dysphagia, protein caloric malnutrition, constipation, dehydration.  Patient seen in ED 09/10/2021 for dehydration and constipation.  DG Abdomen 1 view showed no fecal impaction in the rectum, CT abdomen pelvis with  contrast with  no significant finding.  Treated with IV fluid, MiraLAX and discharged home. History obtained from review of EMR, discussion with primary team, caregiver, family and/or Mr. Polidori.  ?Review and summarization of Epic records shows history from other than patient. Rest of 10 point ROS asked and negative.  ?Independent interpretation of tests and reviewed as needed, available labs, patient records, imaging, studies and related documents from the EMR. ? ? ?ROS ?General: NAD ?EYES: denies vision changes ?ENMT: Endorses dysphagia ?Cardiovascular: denies chest pain/discomfort ?Pulmonary: denies cough, denies SOB ?Abdomen: endorses good appetite, denies constipation/diarrhea ?GU: denies dysuria, urinary frequency ?MSK:  endorses weakness,  no falls reported ?Skin: denies rashes or wounds ?Neurological: denies pain, denies insomnia ?Psych: Endorses positive mood ?Heme/lymph/immuno: denies bruises, abnormal bleeding ? ? ?PAST MEDICAL HISTORY:  ?Active Ambulatory Problems  ?  Diagnosis Date Noted  ? Hyperlipidemia 11/06/2006  ? TOBACCO ABUSE 11/06/2006  ? COPD (chronic obstructive pulmonary disease) ([facility]) 11/06/2006  ? HOARSENESS, CHRONIC 02/05/2007  ? COLONIC POLYPS, HX OF 11/06/2006  ? PROSTATITIS, HX OF 11/06/2006  ? Osteopenia 07/19/2011  ? Anxiety 01/27/2012  ? Bruit of left carotid artery 08/01/2012  ? Carotid stenosis 08/14/2012  ? Occlusion and stenosis of carotid artery without mention of cerebral infarction 08/28/2012  ? History of hip fracture 07/20/2013  ? S/P total hip arthroplasty 08/02/2013  ? H/O splenectomy 08/02/2013  ? 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 Routine general medical examination at a health care facility 12/09/2014  ? Encounter for Medicare annual wellness exam 12/17/2014  ? Special screening for malignant neoplasms, colon 12/17/2014  ? H/O: stroke 02/06/2017  ? Atrial fibrillation, new onset ([facility]) 02/06/2017  ? Carotid occlusion, right   ? Chronic anticoagulation  06/29/2017  ? Urinary frequency 10/11/2017  ? BPH (benign prostatic hyperplasia) 10/11/2017  ? Dementia ([facility]) 04/23/2018  ? Sleep concern 04/23/2018  ? Chronic hoarseness 02/05/2007  ? Fall at home 07/20/2013  ? Osteoporosis 07/19/2011  ? Tobacco dependence 11/06/2006  ? History of laryngeal cancer 09/05/2019  ? Cellulitis of left lower extremity 10/02/2019  ? Wound of right foot 10/02/2019  ? Abdominal aortic aneurysm without rupture ([facility]) 06/02/2021  ? Macrocytosis 06/02/2021  ? Multi-infarct dementia with delirium ([facility]) 06/02/2021  ? Peripheral arterial disease ([facility]) 06/02/2021  ? Pernicious anemia 06/02/2021  ? Longstanding persistent atrial fibrillation ([facility]) 07/06/2021  ? Hypercoagulable state due to atrial fibrillation ([facility]) 07/06/2021  ? Chronic pain 08/31/2021  ? Difficulty swallowing solids 08/31/2021  ? Dysphagia 08/31/2021  ? History of malignant neoplasm of esophagus 08/31/2021  ? ?Resolved Ambulatory Problems  ?  Diagnosis Date Noted  ? 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 Thrombocytopenia ([facility]) 11/06/2006  ? Prostate cancer screening 07/10/2011  ? Forehead laceration 04/13/2012  ? Skin lesion 02/12/2016  ? Sebaceous cyst 01/03/2017  ? 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 ?Past Medical History:  ?Diagnosis Date  ? Carotid artery occlusion   ? Chronic atrial fibrillation ([facility])   ? Fall due to ice or snow 07/21/2013  ? History of ITP   ? Hx of colonic polyps   ? Hyperlipemia   ? Osteoporosis   ? Smoker   ? Stroke [facility])   ? ? ?SOCIAL HX:  ?Social History  ? ?Tobacco Use  ? Smoking status: Every Day  ?  Packs/day: 0.50  ?  Types: Cigarettes  ? Smokeless tobacco: Never  ?Substance Use Topics  ? Alcohol use: No  ?  Alcohol/week: 0.0 standard drinks  ? ?  ?FAMILY HX:  ?Family History  ?Problem Relation Age of Onset  ? Uterine cancer Mother   ? Heart disease Sister   ?     some heart trouble  ? Colon cancer Neg Hx   ? Stomach  cancer Neg Hx   ? Pancreatic cancer Neg Hx   ? Esophageal cancer Neg Hx   ?   ? ?ALLERGIES: No Known Allergies   ? ?PERTINENT MEDICATIONS:  ?Outpatient Encounter Medications as of 09/23/2021  ?Medication Sig  ? apixaban

## 2021-09-28 ENCOUNTER — Ambulatory Visit (HOSPITAL_COMMUNITY)
Admission: RE | Admit: 2021-09-28 | Discharge: 2021-09-28 | Disposition: A | Discharge status: Home / Self Care | Payer: Medicare Other | Source: Ambulatory Visit | Attending: Physician Assistant | Admitting: Physician Assistant

## 2021-09-28 DIAGNOSIS — T17320A Food in larynx causing asphyxiation, initial encounter: Secondary | ICD-10-CM | POA: Diagnosis not present

## 2021-09-28 DIAGNOSIS — R1312 Dysphagia, oropharyngeal phase: Secondary | ICD-10-CM | POA: Diagnosis not present

## 2021-09-28 DIAGNOSIS — E860 Dehydration: Secondary | ICD-10-CM | POA: Insufficient documentation

## 2021-09-28 DIAGNOSIS — R634 Abnormal weight loss: Secondary | ICD-10-CM | POA: Diagnosis not present

## 2021-09-28 DIAGNOSIS — R627 Adult failure to thrive: Secondary | ICD-10-CM | POA: Insufficient documentation

## 2021-09-28 DIAGNOSIS — K5900 Constipation, unspecified: Secondary | ICD-10-CM | POA: Insufficient documentation

## 2021-09-28 DIAGNOSIS — F0390 Unspecified dementia without behavioral disturbance: Secondary | ICD-10-CM | POA: Insufficient documentation

## 2021-09-28 DIAGNOSIS — Z8673 Personal history of transient ischemic attack (TIA), and cerebral infarction without residual deficits: Secondary | ICD-10-CM | POA: Diagnosis not present

## 2021-09-28 DIAGNOSIS — R1310 Dysphagia, unspecified: Secondary | ICD-10-CM | POA: Diagnosis not present

## 2021-10-18 DIAGNOSIS — D510 Vitamin B12 deficiency anemia due to intrinsic factor deficiency: Secondary | ICD-10-CM | POA: Diagnosis not present

## 2021-10-26 ENCOUNTER — Other Ambulatory Visit: Payer: Self-pay

## 2021-10-26 ENCOUNTER — Emergency Department (HOSPITAL_COMMUNITY): Payer: Medicare Other

## 2021-10-26 ENCOUNTER — Inpatient Hospital Stay (HOSPITAL_COMMUNITY): Payer: Medicare Other

## 2021-10-26 ENCOUNTER — Inpatient Hospital Stay (HOSPITAL_COMMUNITY)
Admission: EM | Admit: 2021-10-26 | Discharge: 2021-11-27 | DRG: 871 | Disposition: E | Payer: Medicare Other | Attending: Student | Admitting: Student

## 2021-10-26 ENCOUNTER — Inpatient Hospital Stay: Payer: Self-pay

## 2021-10-26 ENCOUNTER — Encounter (HOSPITAL_COMMUNITY): Payer: Self-pay | Admitting: Pulmonary Disease

## 2021-10-26 DIAGNOSIS — N179 Acute kidney failure, unspecified: Secondary | ICD-10-CM

## 2021-10-26 DIAGNOSIS — Z9911 Dependence on respirator [ventilator] status: Secondary | ICD-10-CM

## 2021-10-26 DIAGNOSIS — I4891 Unspecified atrial fibrillation: Secondary | ICD-10-CM

## 2021-10-26 DIAGNOSIS — J189 Pneumonia, unspecified organism: Secondary | ICD-10-CM

## 2021-10-26 DIAGNOSIS — A419 Sepsis, unspecified organism: Secondary | ICD-10-CM

## 2021-10-26 DIAGNOSIS — R6520 Severe sepsis without septic shock: Secondary | ICD-10-CM

## 2021-10-26 DIAGNOSIS — J9601 Acute respiratory failure with hypoxia: Secondary | ICD-10-CM

## 2021-10-26 DIAGNOSIS — R4189 Other symptoms and signs involving cognitive functions and awareness: Secondary | ICD-10-CM

## 2021-10-26 DIAGNOSIS — R6521 Severe sepsis with septic shock: Secondary | ICD-10-CM

## 2021-10-26 DIAGNOSIS — E43 Unspecified severe protein-calorie malnutrition: Secondary | ICD-10-CM | POA: Insufficient documentation

## 2021-10-26 DIAGNOSIS — Z7189 Other specified counseling: Secondary | ICD-10-CM

## 2021-10-26 DIAGNOSIS — Z743 Need for continuous supervision: Secondary | ICD-10-CM | POA: Diagnosis not present

## 2021-10-26 DIAGNOSIS — Z9181 History of falling: Secondary | ICD-10-CM

## 2021-10-26 DIAGNOSIS — I248 Other forms of acute ischemic heart disease: Secondary | ICD-10-CM | POA: Diagnosis not present

## 2021-10-26 DIAGNOSIS — J439 Emphysema, unspecified: Secondary | ICD-10-CM | POA: Diagnosis not present

## 2021-10-26 DIAGNOSIS — R7303 Prediabetes: Secondary | ICD-10-CM | POA: Diagnosis present

## 2021-10-26 DIAGNOSIS — D696 Thrombocytopenia, unspecified: Secondary | ICD-10-CM | POA: Diagnosis present

## 2021-10-26 DIAGNOSIS — E875 Hyperkalemia: Secondary | ICD-10-CM | POA: Diagnosis not present

## 2021-10-26 DIAGNOSIS — Z66 Do not resuscitate: Secondary | ICD-10-CM | POA: Diagnosis present

## 2021-10-26 DIAGNOSIS — S199XXA Unspecified injury of neck, initial encounter: Secondary | ICD-10-CM | POA: Diagnosis not present

## 2021-10-26 DIAGNOSIS — Z8673 Personal history of transient ischemic attack (TIA), and cerebral infarction without residual deficits: Secondary | ICD-10-CM

## 2021-10-26 DIAGNOSIS — J90 Pleural effusion, not elsewhere classified: Secondary | ICD-10-CM | POA: Diagnosis not present

## 2021-10-26 DIAGNOSIS — J984 Other disorders of lung: Secondary | ICD-10-CM | POA: Diagnosis not present

## 2021-10-26 DIAGNOSIS — I482 Chronic atrial fibrillation, unspecified: Secondary | ICD-10-CM | POA: Diagnosis present

## 2021-10-26 DIAGNOSIS — M4802 Spinal stenosis, cervical region: Secondary | ICD-10-CM | POA: Diagnosis not present

## 2021-10-26 DIAGNOSIS — M4312 Spondylolisthesis, cervical region: Secondary | ICD-10-CM | POA: Diagnosis not present

## 2021-10-26 DIAGNOSIS — F0150 Vascular dementia without behavioral disturbance: Secondary | ICD-10-CM | POA: Diagnosis present

## 2021-10-26 DIAGNOSIS — R9431 Abnormal electrocardiogram [ECG] [EKG]: Secondary | ICD-10-CM | POA: Diagnosis not present

## 2021-10-26 DIAGNOSIS — E872 Acidosis, unspecified: Secondary | ICD-10-CM | POA: Diagnosis not present

## 2021-10-26 DIAGNOSIS — J441 Chronic obstructive pulmonary disease with (acute) exacerbation: Secondary | ICD-10-CM | POA: Diagnosis not present

## 2021-10-26 DIAGNOSIS — N182 Chronic kidney disease, stage 2 (mild): Secondary | ICD-10-CM | POA: Diagnosis present

## 2021-10-26 DIAGNOSIS — R404 Transient alteration of awareness: Secondary | ICD-10-CM | POA: Diagnosis present

## 2021-10-26 DIAGNOSIS — G9341 Metabolic encephalopathy: Secondary | ICD-10-CM | POA: Diagnosis present

## 2021-10-26 DIAGNOSIS — J988 Other specified respiratory disorders: Secondary | ICD-10-CM | POA: Diagnosis not present

## 2021-10-26 DIAGNOSIS — E871 Hypo-osmolality and hyponatremia: Secondary | ICD-10-CM | POA: Diagnosis not present

## 2021-10-26 DIAGNOSIS — R64 Cachexia: Secondary | ICD-10-CM | POA: Diagnosis present

## 2021-10-26 DIAGNOSIS — Z20822 Contact with and (suspected) exposure to covid-19: Secondary | ICD-10-CM | POA: Diagnosis present

## 2021-10-26 DIAGNOSIS — J690 Pneumonitis due to inhalation of food and vomit: Secondary | ICD-10-CM | POA: Diagnosis not present

## 2021-10-26 DIAGNOSIS — M8448XA Pathological fracture, other site, initial encounter for fracture: Secondary | ICD-10-CM | POA: Diagnosis not present

## 2021-10-26 DIAGNOSIS — R079 Chest pain, unspecified: Secondary | ICD-10-CM | POA: Diagnosis not present

## 2021-10-26 DIAGNOSIS — S299XXA Unspecified injury of thorax, initial encounter: Secondary | ICD-10-CM | POA: Diagnosis not present

## 2021-10-26 DIAGNOSIS — S3991XA Unspecified injury of abdomen, initial encounter: Secondary | ICD-10-CM | POA: Diagnosis not present

## 2021-10-26 DIAGNOSIS — R6889 Other general symptoms and signs: Secondary | ICD-10-CM | POA: Diagnosis not present

## 2021-10-26 DIAGNOSIS — Z4682 Encounter for fitting and adjustment of non-vascular catheter: Secondary | ICD-10-CM | POA: Diagnosis not present

## 2021-10-26 DIAGNOSIS — R54 Age-related physical debility: Secondary | ICD-10-CM | POA: Diagnosis present

## 2021-10-26 DIAGNOSIS — I739 Peripheral vascular disease, unspecified: Secondary | ICD-10-CM | POA: Diagnosis present

## 2021-10-26 DIAGNOSIS — Z515 Encounter for palliative care: Secondary | ICD-10-CM | POA: Diagnosis not present

## 2021-10-26 DIAGNOSIS — A409 Streptococcal sepsis, unspecified: Secondary | ICD-10-CM | POA: Diagnosis not present

## 2021-10-26 DIAGNOSIS — R109 Unspecified abdominal pain: Secondary | ICD-10-CM | POA: Diagnosis not present

## 2021-10-26 DIAGNOSIS — R739 Hyperglycemia, unspecified: Secondary | ICD-10-CM | POA: Diagnosis not present

## 2021-10-26 DIAGNOSIS — J9602 Acute respiratory failure with hypercapnia: Secondary | ICD-10-CM | POA: Diagnosis present

## 2021-10-26 DIAGNOSIS — Z79899 Other long term (current) drug therapy: Secondary | ICD-10-CM

## 2021-10-26 DIAGNOSIS — Z6820 Body mass index (BMI) 20.0-20.9, adult: Secondary | ICD-10-CM

## 2021-10-26 DIAGNOSIS — I714 Abdominal aortic aneurysm, without rupture, unspecified: Secondary | ICD-10-CM | POA: Diagnosis present

## 2021-10-26 DIAGNOSIS — Z7901 Long term (current) use of anticoagulants: Secondary | ICD-10-CM

## 2021-10-26 DIAGNOSIS — I499 Cardiac arrhythmia, unspecified: Secondary | ICD-10-CM | POA: Diagnosis not present

## 2021-10-26 DIAGNOSIS — E861 Hypovolemia: Secondary | ICD-10-CM | POA: Diagnosis present

## 2021-10-26 DIAGNOSIS — Z923 Personal history of irradiation: Secondary | ICD-10-CM

## 2021-10-26 DIAGNOSIS — J929 Pleural plaque without asbestos: Secondary | ICD-10-CM | POA: Diagnosis not present

## 2021-10-26 DIAGNOSIS — Z720 Tobacco use: Secondary | ICD-10-CM

## 2021-10-26 DIAGNOSIS — R918 Other nonspecific abnormal finding of lung field: Secondary | ICD-10-CM | POA: Diagnosis not present

## 2021-10-26 DIAGNOSIS — Z8521 Personal history of malignant neoplasm of larynx: Secondary | ICD-10-CM

## 2021-10-26 HISTORY — DX: Unspecified atrial fibrillation: I48.91

## 2021-10-26 HISTORY — DX: Personal history of diseases of the blood and blood-forming organs and certain disorders involving the immune mechanism: Z86.2

## 2021-10-26 HISTORY — DX: Peripheral vascular disease, unspecified: I73.9

## 2021-10-26 HISTORY — DX: Vascular dementia, unspecified severity, without behavioral disturbance, psychotic disturbance, mood disturbance, and anxiety: F01.50

## 2021-10-26 HISTORY — DX: Malignant neoplasm of larynx, unspecified: C32.9

## 2021-10-26 LAB — I-STAT ARTERIAL BLOOD GAS, ED
Acid-base deficit: 11 mmol/L — ABNORMAL HIGH (ref 0.0–2.0)
Acid-base deficit: 11 mmol/L — ABNORMAL HIGH (ref 0.0–2.0)
Bicarbonate: 19.1 mmol/L — ABNORMAL LOW (ref 20.0–28.0)
Bicarbonate: 18.3 mmol/L — ABNORMAL LOW (ref 20.0–28.0)
Calcium, Ion: 1.2 mmol/L (ref 1.15–1.40)
Calcium, Ion: 1.22 mmol/L (ref 1.15–1.40)
HCT: 36 % — ABNORMAL LOW (ref 39.0–52.0)
HCT: 39 % (ref 39.0–52.0)
Hemoglobin: 12.2 g/dL — ABNORMAL LOW (ref 13.0–17.0)
Hemoglobin: 13.3 g/dL (ref 13.0–17.0)
O2 Saturation: 100 %
O2 Saturation: 88 %
Patient temperature: 97.1
Patient temperature: 97.1
Potassium: 3 mmol/L — ABNORMAL LOW (ref 3.5–5.1)
Potassium: 3.1 mmol/L — ABNORMAL LOW (ref 3.5–5.1)
Sodium: 136 mmol/L (ref 135–145)
Sodium: 135 mmol/L (ref 135–145)
TCO2: 21 mmol/L — ABNORMAL LOW (ref 22–32)
TCO2: 20 mmol/L — ABNORMAL LOW (ref 22–32)
pCO2 arterial: 58 mmHg — ABNORMAL HIGH (ref 32–48)
pCO2 arterial: 53.9 mmHg — ABNORMAL HIGH (ref 32–48)
pH, Arterial: 7.12 — CL (ref 7.35–7.45)
pH, Arterial: 7.133 — CL (ref 7.35–7.45)
pO2, Arterial: 436 mmHg — ABNORMAL HIGH (ref 83–108)
pO2, Arterial: 69 mmHg — ABNORMAL LOW (ref 83–108)

## 2021-10-26 LAB — URINALYSIS, ROUTINE W REFLEX MICROSCOPIC
Bilirubin Urine: NEGATIVE
Glucose, UA: >=500 mg/dL — AB
Ketones, ur: NEGATIVE mg/dL
Leukocytes,Ua: NEGATIVE
Nitrite: NEGATIVE
Protein, ur: 100 mg/dL — AB
Specific Gravity, Urine: 1.016 (ref 1.005–1.030)
pH: 5 (ref 5.0–8.0)

## 2021-10-26 LAB — COMPREHENSIVE METABOLIC PANEL
ALT: 17 U/L (ref 0–44)
AST: 42 U/L — ABNORMAL HIGH (ref 15–41)
Albumin: 2.8 g/dL — ABNORMAL LOW (ref 3.5–5.0)
Alkaline Phosphatase: 82 U/L (ref 38–126)
Anion gap: 19 — ABNORMAL HIGH (ref 5–15)
BUN: 37 mg/dL — ABNORMAL HIGH (ref 8–23)
CO2: 17 mmol/L — ABNORMAL LOW (ref 22–32)
Calcium: 9.2 mg/dL (ref 8.9–10.3)
Chloride: 96 mmol/L — ABNORMAL LOW (ref 98–111)
Creatinine, Ser: 1.62 mg/dL — ABNORMAL HIGH (ref 0.61–1.24)
GFR, Estimated: 41 mL/min — ABNORMAL LOW (ref >60)
Glucose, Bld: 319 mg/dL — ABNORMAL HIGH (ref 70–99)
Potassium: 3.6 mmol/L (ref 3.5–5.1)
Sodium: 132 mmol/L — ABNORMAL LOW (ref 135–145)
Total Bilirubin: 1.1 mg/dL (ref 0.3–1.2)
Total Protein: 6.4 g/dL — ABNORMAL LOW (ref 6.5–8.1)

## 2021-10-26 LAB — CBC WITH DIFFERENTIAL/PLATELET
Abs Immature Granulocytes: 0.19 10*3/uL — ABNORMAL HIGH (ref 0.00–0.07)
Basophils Absolute: 0 10*3/uL (ref 0.0–0.1)
Basophils Relative: 0 %
Eosinophils Absolute: 0 10*3/uL (ref 0.0–0.5)
Eosinophils Relative: 0 %
HCT: 42.5 % (ref 39.0–52.0)
Hemoglobin: 13.8 g/dL (ref 13.0–17.0)
Immature Granulocytes: 1 %
Lymphocytes Relative: 11 %
Lymphs Abs: 1.5 10*3/uL (ref 0.7–4.0)
MCH: 28.3 pg (ref 26.0–34.0)
MCHC: 32.5 g/dL (ref 30.0–36.0)
MCV: 87.3 fL (ref 80.0–100.0)
Monocytes Absolute: 0.6 10*3/uL (ref 0.1–1.0)
Monocytes Relative: 5 %
Neutro Abs: 11.3 10*3/uL — ABNORMAL HIGH (ref 1.7–7.7)
Neutrophils Relative %: 83 %
Platelets: 41 10*3/uL — ABNORMAL LOW (ref 150–400)
RBC: 4.87 MIL/uL (ref 4.22–5.81)
RDW: 19.2 % — ABNORMAL HIGH (ref 11.5–15.5)
WBC: 13.6 10*3/uL — ABNORMAL HIGH (ref 4.0–10.5)
nRBC: 0.7 % — ABNORMAL HIGH (ref 0.0–0.2)

## 2021-10-26 LAB — POCT I-STAT 7, (LYTES, BLD GAS, ICA,H+H)
Acid-base deficit: 4 mmol/L — ABNORMAL HIGH (ref 0.0–2.0)
Bicarbonate: 23 mmol/L (ref 20.0–28.0)
Calcium, Ion: 1.24 mmol/L (ref 1.15–1.40)
HCT: 43 % (ref 39.0–52.0)
Hemoglobin: 14.6 g/dL (ref 13.0–17.0)
O2 Saturation: 80 %
Patient temperature: 38.9
Potassium: 3.1 mmol/L — ABNORMAL LOW (ref 3.5–5.1)
Sodium: 137 mmol/L (ref 135–145)
TCO2: 24 mmol/L (ref 22–32)
pCO2 arterial: 53.4 mmHg — ABNORMAL HIGH (ref 32–48)
pH, Arterial: 7.251 — ABNORMAL LOW (ref 7.35–7.45)
pO2, Arterial: 58 mmHg — ABNORMAL LOW (ref 83–108)

## 2021-10-26 LAB — RESPIRATORY PANEL BY PCR

## 2021-10-26 LAB — BASIC METABOLIC PANEL
Anion gap: 12 (ref 5–15)
BUN: 31 mg/dL — ABNORMAL HIGH (ref 8–23)
CO2: 22 mmol/L (ref 22–32)
Calcium: 8.2 mg/dL — ABNORMAL LOW (ref 8.9–10.3)
Chloride: 102 mmol/L (ref 98–111)
Creatinine, Ser: 1.29 mg/dL — ABNORMAL HIGH (ref 0.61–1.24)
GFR, Estimated: 53 mL/min — ABNORMAL LOW (ref >60)
Glucose, Bld: 166 mg/dL — ABNORMAL HIGH (ref 70–99)
Potassium: 3.2 mmol/L — ABNORMAL LOW (ref 3.5–5.1)
Sodium: 136 mmol/L (ref 135–145)

## 2021-10-26 LAB — LACTIC ACID, PLASMA
Lactic Acid, Venous: >9 mmol/L (ref 0.5–1.9)
Lactic Acid, Venous: 7.9 mmol/L (ref 0.5–1.9)

## 2021-10-26 LAB — PROCALCITONIN: Procalcitonin: 0.82 ng/mL

## 2021-10-26 LAB — MRSA NEXT GEN BY PCR, NASAL: MRSA by PCR Next Gen: NOT DETECTED

## 2021-10-26 LAB — ECHOCARDIOGRAM COMPLETE
Height: 67 in
Weight: 2045.87 oz

## 2021-10-26 LAB — RESP PANEL BY RT-PCR (FLU A&B, COVID) ARPGX2
Influenza A by PCR: NEGATIVE
Influenza B by PCR: NEGATIVE
SARS Coronavirus 2 by RT PCR: NEGATIVE

## 2021-10-26 LAB — GLUCOSE, CAPILLARY
Glucose-Capillary: 147 mg/dL — ABNORMAL HIGH (ref 70–99)
Glucose-Capillary: 45 mg/dL — ABNORMAL LOW (ref 70–99)
Glucose-Capillary: 81 mg/dL (ref 70–99)

## 2021-10-26 LAB — TROPONIN I (HIGH SENSITIVITY)
Troponin I (High Sensitivity): 88 ng/L — ABNORMAL HIGH (ref <18)
Troponin I (High Sensitivity): 146 ng/L (ref <18)

## 2021-10-26 LAB — PROTIME-INR
INR: 1.5 — ABNORMAL HIGH (ref 0.8–1.2)
Prothrombin Time: 17.8 seconds — ABNORMAL HIGH (ref 11.4–15.2)

## 2021-10-26 LAB — CBG MONITORING, ED: Glucose-Capillary: 316 mg/dL — ABNORMAL HIGH (ref 70–99)

## 2021-10-26 LAB — HEMOGLOBIN A1C
Hgb A1c MFr Bld: 6.3 % — ABNORMAL HIGH (ref 4.8–5.6)
Mean Plasma Glucose: 134.11 mg/dL

## 2021-10-26 LAB — STREP PNEUMONIAE URINARY ANTIGEN: Strep Pneumo Urinary Antigen: NEGATIVE

## 2021-10-26 LAB — CK: Total CK: 120 U/L (ref 49–397)

## 2021-10-26 MED ORDER — POLYETHYLENE GLYCOL 3350 17 G PO PACK
17.0000 g | PACK | Freq: Every day | ORAL | Status: DC
  Administered 2021-10-27 – 2021-10-28 (×2): 17 g
  Filled 2021-10-26 (×2): qty 1

## 2021-10-26 MED ORDER — FENTANYL 2500MCG IN NS 250ML (10MCG/ML) PREMIX INFUSION
25.0000 ug/h | INTRAVENOUS | Status: DC
  Administered 2021-10-26: 100 ug/h via INTRAVENOUS
  Administered 2021-10-26: 50 ug/h via INTRAVENOUS
  Administered 2021-10-27 (×2): 150 ug/h via INTRAVENOUS
  Filled 2021-10-26 (×3): qty 250

## 2021-10-26 MED ORDER — METRONIDAZOLE 500 MG/100ML IV SOLN
500.0000 mg | Freq: Two times a day (BID) | INTRAVENOUS | Status: DC
  Administered 2021-10-26: 500 mg via INTRAVENOUS
  Filled 2021-10-26: qty 100

## 2021-10-26 MED ORDER — IPRATROPIUM-ALBUTEROL 0.5-2.5 (3) MG/3ML IN SOLN
3.0000 mL | RESPIRATORY_TRACT | Status: DC
  Administered 2021-10-26 – 2021-10-27 (×5): 3 mL via RESPIRATORY_TRACT
  Filled 2021-10-26 (×5): qty 3

## 2021-10-26 MED ORDER — NOREPINEPHRINE 4 MG/250ML-% IV SOLN
2.0000 ug/min | INTRAVENOUS | Status: DC
  Administered 2021-10-26: 5.013 ug/min via INTRAVENOUS
  Filled 2021-10-26: qty 250

## 2021-10-26 MED ORDER — SODIUM CHLORIDE 0.9 % IV SOLN
500.0000 mg | INTRAVENOUS | Status: DC
  Administered 2021-10-26 – 2021-10-28 (×3): 500 mg via INTRAVENOUS
  Filled 2021-10-26 (×5): qty 5

## 2021-10-26 MED ORDER — PANTOPRAZOLE 2 MG/ML SUSPENSION
40.0000 mg | Freq: Every day | ORAL | Status: DC
  Administered 2021-10-27 – 2021-10-28 (×2): 40 mg
  Filled 2021-10-26 (×2): qty 20

## 2021-10-26 MED ORDER — FENTANYL CITRATE (PF) 100 MCG/2ML IJ SOLN
INTRAMUSCULAR | Status: AC
  Administered 2021-10-26: 100 ug
  Filled 2021-10-26: qty 2

## 2021-10-26 MED ORDER — VANCOMYCIN HCL IN DEXTROSE 1-5 GM/200ML-% IV SOLN
1000.0000 mg | Freq: Once | INTRAVENOUS | Status: AC
  Administered 2021-10-26: 1000 mg via INTRAVENOUS
  Filled 2021-10-26: qty 200

## 2021-10-26 MED ORDER — SODIUM BICARBONATE 8.4 % IV SOLN
100.0000 meq | Freq: Once | INTRAVENOUS | Status: AC
Start: 2021-10-26 — End: 2021-10-26
  Administered 2021-10-26: 100 meq via INTRAVENOUS
  Filled 2021-10-26: qty 50

## 2021-10-26 MED ORDER — ROCURONIUM BROMIDE 50 MG/5ML IV SOLN
INTRAVENOUS | Status: AC | PRN
  Administered 2021-10-26: 50 mg via INTRAVENOUS

## 2021-10-26 MED ORDER — PROSOURCE TF PO LIQD
45.0000 mL | Freq: Two times a day (BID) | ORAL | Status: DC
Start: 2021-10-26 — End: 2021-10-27
  Administered 2021-10-26 – 2021-10-27 (×2): 45 mL
  Filled 2021-10-26 (×3): qty 45

## 2021-10-26 MED ORDER — FENTANYL CITRATE PF 50 MCG/ML IJ SOSY: 25.0000 ug | PREFILLED_SYRINGE | INTRAMUSCULAR | Status: DC | PRN

## 2021-10-26 MED ORDER — ETOMIDATE 2 MG/ML IV SOLN
INTRAVENOUS | Status: AC | PRN
  Administered 2021-10-26: 10 mg via INTRAVENOUS

## 2021-10-26 MED ORDER — LACTATED RINGERS IV BOLUS (SEPSIS)
250.0000 mL | Freq: Once | INTRAVENOUS | Status: AC
  Administered 2021-10-26: 250 mL via INTRAVENOUS

## 2021-10-26 MED ORDER — INSULIN ASPART 100 UNIT/ML IJ SOLN
0.0000 [IU] | INTRAMUSCULAR | Status: DC
  Administered 2021-10-26: 11 [IU] via SUBCUTANEOUS
  Administered 2021-10-26 – 2021-10-27 (×4): 2 [IU] via SUBCUTANEOUS
  Administered 2021-10-28: 3 [IU] via SUBCUTANEOUS
  Administered 2021-10-28 (×2): 2 [IU] via SUBCUTANEOUS

## 2021-10-26 MED ORDER — NOREPINEPHRINE 4 MG/250ML-% IV SOLN: 0.0000 ug/min | INTRAVENOUS | Status: DC

## 2021-10-26 MED ORDER — LACTATED RINGERS IV BOLUS
1000.0000 mL | Freq: Once | INTRAVENOUS | Status: AC
  Administered 2021-10-26: 1000 mL via INTRAVENOUS

## 2021-10-26 MED ORDER — DEXTROSE 50 % IV SOLN: 25.0000 g | INTRAVENOUS | Status: AC

## 2021-10-26 MED ORDER — SODIUM CHLORIDE 0.9 % IV SOLN: 2.0000 g | INTRAVENOUS | Status: DC

## 2021-10-26 MED ORDER — SODIUM CHLORIDE 0.9 % IV SOLN
INTRAVENOUS | Status: AC | PRN
  Administered 2021-10-26: 1000 mL via INTRAVENOUS

## 2021-10-26 MED ORDER — ALBUTEROL SULFATE (2.5 MG/3ML) 0.083% IN NEBU: 10.0000 mg | INHALATION_SOLUTION | RESPIRATORY_TRACT | Status: DC

## 2021-10-26 MED ORDER — METRONIDAZOLE 500 MG/100ML IV SOLN
500.0000 mg | Freq: Once | INTRAVENOUS | Status: AC
Start: 2021-10-26 — End: 2021-10-26
  Administered 2021-10-26: 500 mg via INTRAVENOUS
  Filled 2021-10-26: qty 100

## 2021-10-26 MED ORDER — STERILE WATER FOR INJECTION IV SOLN
INTRAVENOUS | Status: DC
  Filled 2021-10-26 (×2): qty 1000

## 2021-10-26 MED ORDER — PHENYLEPHRINE 80 MCG/ML (10ML) SYRINGE FOR IV PUSH (FOR BLOOD PRESSURE SUPPORT)
PREFILLED_SYRINGE | INTRAVENOUS | Status: AC | PRN
  Administered 2021-10-26 (×3): 80 ug via INTRAVENOUS

## 2021-10-26 MED ORDER — FENTANYL BOLUS VIA INFUSION
25.0000 ug | INTRAVENOUS | Status: DC | PRN
  Administered 2021-10-28: 25 ug via INTRAVENOUS
  Administered 2021-10-28: 50 ug via INTRAVENOUS

## 2021-10-26 MED ORDER — CHLORHEXIDINE GLUCONATE 0.12% ORAL RINSE (MEDLINE KIT)
15.0000 mL | Freq: Two times a day (BID) | OROMUCOSAL | Status: DC
  Administered 2021-10-26 – 2021-10-29 (×6): 15 mL via OROMUCOSAL

## 2021-10-26 MED ORDER — METHYLPREDNISOLONE SODIUM SUCC 40 MG IJ SOLR
40.0000 mg | INTRAMUSCULAR | Status: DC
  Administered 2021-10-27: 40 mg via INTRAVENOUS
  Filled 2021-10-26: qty 1

## 2021-10-26 MED ORDER — ALBUTEROL SULFATE (2.5 MG/3ML) 0.083% IN NEBU
INHALATION_SOLUTION | RESPIRATORY_TRACT | Status: AC
  Administered 2021-10-26: 10 mg/h via RESPIRATORY_TRACT
  Filled 2021-10-26: qty 12

## 2021-10-26 MED ORDER — SODIUM CHLORIDE 0.9 % IV SOLN
2.0000 g | Freq: Once | INTRAVENOUS | Status: AC
  Administered 2021-10-26: 2 g via INTRAVENOUS
  Filled 2021-10-26: qty 12.5

## 2021-10-26 MED ORDER — ALBUMIN HUMAN 25 % IV SOLN
25.0000 g | Freq: Once | INTRAVENOUS | Status: AC
  Administered 2021-10-26: 25 g via INTRAVENOUS
  Filled 2021-10-26: qty 100

## 2021-10-26 MED ORDER — ORAL CARE MOUTH RINSE
15.0000 mL | OROMUCOSAL | Status: DC
  Administered 2021-10-26 – 2021-10-29 (×24): 15 mL via OROMUCOSAL

## 2021-10-26 MED ORDER — LACTATED RINGERS IV SOLN: INTRAVENOUS | Status: AC

## 2021-10-26 MED ORDER — METHYLPREDNISOLONE SODIUM SUCC 125 MG IJ SOLR
125.0000 mg | Freq: Once | INTRAMUSCULAR | Status: AC
  Administered 2021-10-26: 125 mg via INTRAVENOUS
  Filled 2021-10-26: qty 2

## 2021-10-26 MED ORDER — DOCUSATE SODIUM 50 MG/5ML PO LIQD
100.0000 mg | Freq: Two times a day (BID) | ORAL | Status: DC
  Administered 2021-10-27 – 2021-10-28 (×4): 100 mg
  Filled 2021-10-26 (×4): qty 10

## 2021-10-26 MED ORDER — NOREPINEPHRINE 16 MG/250ML-% IV SOLN
0.0000 ug/min | INTRAVENOUS | Status: DC
  Administered 2021-10-26: 20 ug/min via INTRAVENOUS
  Administered 2021-10-27: 25 ug/min via INTRAVENOUS
  Administered 2021-10-27: 9 ug/min via INTRAVENOUS
  Filled 2021-10-26: qty 250

## 2021-10-26 MED ORDER — LACTATED RINGERS IV BOLUS (SEPSIS)
1000.0000 mL | Freq: Once | INTRAVENOUS | Status: AC
Start: 2021-10-26 — End: 2021-10-26
  Administered 2021-10-26: 1000 mL via INTRAVENOUS

## 2021-10-26 MED ORDER — SODIUM CHLORIDE 0.9 % IV SOLN
250.0000 mL | INTRAVENOUS | Status: DC
  Administered 2021-10-26: 250 mL via INTRAVENOUS

## 2021-10-26 MED ORDER — SODIUM BICARBONATE 8.4 % IV SOLN
100.0000 meq | Freq: Once | INTRAVENOUS | Status: AC
  Administered 2021-10-26: 100 meq via INTRAVENOUS
  Filled 2021-10-26: qty 100

## 2021-10-26 MED ORDER — POTASSIUM CHLORIDE 10 MEQ/50ML IV SOLN
10.0000 meq | INTRAVENOUS | Status: AC
  Administered 2021-10-26 – 2021-10-27 (×4): 10 meq via INTRAVENOUS
  Filled 2021-10-26 (×4): qty 50

## 2021-10-26 MED ORDER — VANCOMYCIN HCL 750 MG/150ML IV SOLN: 750.0000 mg | INTRAVENOUS | Status: DC

## 2021-10-26 MED ORDER — ALBUTEROL SULFATE (2.5 MG/3ML) 0.083% IN NEBU
10.0000 mg/h | INHALATION_SOLUTION | RESPIRATORY_TRACT | Status: DC
  Filled 2021-10-26: qty 0.5

## 2021-10-26 MED ORDER — DEXTROSE 50 % IV SOLN
INTRAVENOUS | Status: AC
  Administered 2021-10-26: 25 g via INTRAVENOUS
  Filled 2021-10-26: qty 50

## 2021-10-26 MED ORDER — IOHEXOL 350 MG/ML SOLN
75.0000 mL | Freq: Once | INTRAVENOUS | Status: AC | PRN
  Administered 2021-10-26: 75 mL via INTRAVENOUS

## 2021-10-26 MED ORDER — VITAL HIGH PROTEIN PO LIQD
1000.0000 mL | ORAL | Status: DC
Start: 2021-10-26 — End: 2021-10-27
  Administered 2021-10-26: 1000 mL
  Filled 2021-10-26: qty 1000

## 2021-10-26 MED ORDER — HYDROCORTISONE 1 % EX CREA: TOPICAL_CREAM | Freq: Two times a day (BID) | CUTANEOUS | Status: DC | PRN

## 2021-10-26 NOTE — Progress Notes (Signed)
RT transported pt to and from CT without event. 

## 2021-10-26 NOTE — ED Triage Notes (Signed)
Pt BIB [facility] from home found by wife unresponsive with GCS of 3. Wife reports he had been getting weaker the last day & when EMS arrived he "felt warm to touch," they never lost a pulse & he was SBP 100 palpated, 140 bpm, Rales & rhonchi heard all throughout lungs (per EMS), resp was 25-30 bpm, CBG 355, ET CO2 11, & did have gag reflex. Upon arrival to ED he was being bagged & sating in the 90'2, bilateral 16g PIV's. EMS reports pt has a Hx of UTI's & wife states her concern for that again as well, denies any recent falls.

## 2021-10-26 NOTE — H&P (Addendum)
NAME:  Daniel Chapman, MRN:  [id number], DOB:  06/02/1931, LOS: 0 ADMISSION DATE:  09/28/2021 CONSULTATION DATE:  09/29/2021 REFERRING MD:  Doren Custard - EDP CHIEF COMPLAINT:  Unresponsive, c/f sepsis, pneumonia  History of Present Illness:  88 year old man who presented to [facility] ED 5/30 via EMS after being found down at home by his wife. GCS 3 on EMS arrival and agonal breathing, but had a pulse. "Warm to touch" per EMS. PMHx significant for Afib (on Eliquis), PAD, COPD, tobacco abuse, laryngeal CA (s/p radiation), chronic aspiration, AAA, cerebellar stroke, vascular dementia.  Per patient's wife and daughter at bedside, patient generally weak and slept most of the day 5/27.  On 5/28, patient reportedly fell at home and hit his head (of note, on Eliquis).  On 5/29, patient seemed "out of it" most of the day and was found unresponsive in bed this morning 5/30.  Family notes history of chronic aspiration after treatment for laryngeal cancer; he had a poor appetite compared to usual but was able to drink some over the weekend.  On ED arrival, temp 97.1, tachycardic to 110s, tachypneic to 27, SBP 140s. SpO2 88% with BVM. GCS 3. Intubated on ED arrival. CXR with patchy RUL/RLL opacities. CT Head with NAICA, chronic cerebellar infarct. CT C-Spine negative for fracture. CT Chest/A/P demonstrating bilateral multilobar airspace disease (R>L), likely r/t aspiration; no acute abdominal/pelvic findings. Labs notable for ABG 7.120/58/436/19, WBC 13.6, stable H&H. LA > 9.0. UA with > 500 glucose, 100 protein, otherwise unremarkable.  Pertinent Medical History:  Afib (on Eliquis), PAD, COPD, tobacco abuse, laryngeal CA (s/p radiation), chronic aspiration, AAA, cerebellar stroke, dementia  Significant Hospital Events: Including procedures, antibiotic start and stop dates in addition to other pertinent events   5/30 - Presented to [facility] ED via EMS after being found unresponsive in bed by wife. GCS 3. Maintained pulse  throughout, required BVM ventilation. Intubated on arrival to ED. Broad spectrum antibiotics initiated. CT Head/Cspine/A/P without acute findings. CT Chest with bilateral multilobar pneumonia, R > L. Admitted to ICU.  Interim History / Subjective:  PCCM consulted for admission, see above HPI.  Objective:  Blood pressure (!) 72/61, pulse (!) 112, temperature (!) 94.6 F (34.8 C), resp. rate (!) 21, height '5\' 7"'$  (1.702 m), weight 58 kg, SpO2 100 %.    Vent Mode: PRVC FiO2 (%):  [60 %-100 %] 60 % Set Rate:  [26 bmp-30 bmp] 30 bmp Vt Set:  [56 mL-560 mL] 56 mL PEEP:  [5 cmH20-10 cmH20] 8 cmH20 Plateau Pressure:  [22 cmH20] 22 cmH20  No intake or output data in the 24 hours ending 10/24/2021 1321 Filed Weights   10/14/2021 1200 10/10/2021 1224 10/24/2021 1251  Weight: 51 kg 51 kg 58 kg   Physical Examination: General: Chronically ill-appearing elderly man in NAD. Cachectic. HEENT: [place]/AT, anicteric sclera, +cataracts, pupils pinpoint, sluggishly reactive, dry mucous membranes. ETT/OGT in place. Neuro:  Unresponsive.  Does not respond to verbal, tactile or noxious stimuli. Does not withdrawal to pain. Not following commands. +Corneal, +Cough, and +Gag  CV: Irregularly irregular rhythm, rate 90s-100s, no m/g/r. PULM: Breathing even and unlabored on vent (PEEP 8, FiO2 60%). Lung fields with coarse rhonchi bilaterally, R > L. GI: Soft, nontender, nondistended. Hypoactive bowel sounds. Extremities: No LE edema noted. Skin: Cool/dry, skin mottling noted of BUE/BLE. Dry, scaling, mildly erythematous rash to L aspect of anterior neck.  Resolved Hospital Problem List:    Assessment & Plan:  Septic shock likely secondary to multilobar pneumonia Lactic  acidosis - Goal MAP > 65 - Fluid resuscitation as tolerated - Peripheral Levophed titrated to goal MAP - Trend WBC, fever curve - Trend LA - F/u PCT - F/u Cx data (BCx, UCx, TA), RVP, legionella/urine strep - Continue broad-spectrum antibiotics as  below  Acute hypoxemic and hypercarbic respiratory failure secondary to multilobar pneumonia Aspiration pneumonia suspected - Continue full vent support (4-8cc/kg IBW) - Wean FiO2 for O2 sat > 90% - Daily WUA/SBT - VAP bundle - Pulmonary hygiene - PAD protocol for sedation: Fentanyl for goal RASS 0 to -1 - Follow ABG - Daily CXR - Broad spectrum antibiotic coverage (vanc, cefepime, azithromycin)  COPD Tobacco abuse, ongoing LUL cavitation, multiple lung nodules - DuoNebs - Pulmonary hygiene, as above - Repeat CT Chest in 1-2 months once acute infection has resolved to better characterize cavitation/nodules - Encourage cessation when able to participate in care  Atrial fibrillation Troponinemia Longstanding Afib, on Eliquis. CHA2DS2-VASc score 5. Mildly elevated troponin on admission. - Trend troponin - Cardiac monitoring - Hold Eliquis at present, given critical illness/possible need for proceduralization - Heparin gtt, given unstable renal function - Appreciate pharmacy assistance  AKI secondary to sepsis Baseline Cr 0.7 - 1.1. Admission Cr 1.6.  - Trend BMP - Replete electrolytes as indicated - Monitor I&Os - F/u urine studies - Avoid nephrotoxic agents as able - Ensure adequate renal perfusion  History of laryngeal CA, s/p radiation Malnutrition General deconditioning Prior history of radiation for head/neck CA, s/p radiation. Radiation has complicated swallow and lead to chronic aspiration. - SLP when clinically appropriate - PT/OT when clinically appropriate  GOC - Patient/wife were working toward establishing an advanced directive/DNR with PCP prior to this admission. It also appears he has previously established with outpatient Palliative Care - PMT consult - DNR status in chart, per patient's wife - Continue all other measures at present  Best Practice: (right click and "Reselect all SmartList Selections" daily)   Diet/type: NPO w/ meds via tube DVT  prophylaxis: SCDs GI prophylaxis: PPI Lines: N/A Foley:  Yes, and it is still needed Code Status:  DNR Last date of multidisciplinary goals of care discussion [Brief bedside discussion by Dr. Carlis Abbott (CCM) with patient's wife/daughter, patient/wife were working on advanced directive/DNR paperwork with PCP prior to this admission.]  Labs:  CBC: Recent Labs  Lab 10/21/2021 1044 10/09/2021 1133  WBC 13.6*  --   NEUTROABS 11.3*  --   HGB 13.8 12.2*  HCT 42.5 36.0*  MCV 87.3  --   PLT 41*  --    Basic Metabolic Panel: Recent Labs  Lab 09/28/2021 1044 10/20/2021 1133  NA 132* 136  K 3.6 3.0*  CL 96*  --   CO2 17*  --   GLUCOSE 319*  --   BUN 37*  --   CREATININE 1.62*  --   CALCIUM 9.2  --    GFR: Estimated Creatinine Clearance: 25.9 mL/min (A) (by C-G formula based on SCr of 1.62 mg/dL (H)). Recent Labs  Lab 10/01/2021 1044  WBC 13.6*  LATICACIDVEN >9.0*   Liver Function Tests: Recent Labs  Lab 10/01/2021 1044  AST 42*  ALT 17  ALKPHOS 82  BILITOT 1.1  PROT 6.4*  ALBUMIN 2.8*   No results for input(s): LIPASE, AMYLASE in the last 168 hours. No results for input(s): AMMONIA in the last 168 hours.  ABG:    Component Value Date/Time   PHART 7.120 (LL) 10/03/2021 1133   PCO2ART 58.0 (H) 09/29/2021 1133   PO2ART  436 (H) 10/06/2021 1133   HCO3 19.1 (L) 10/23/2021 1133   TCO2 21 (L) 10/10/2021 1133   ACIDBASEDEF 11.0 (H) 10/24/2021 1133   O2SAT 100 10/05/2021 1133   Coagulation Profile: No results for input(s): INR, PROTIME in the last 168 hours.  Cardiac Enzymes: Recent Labs  Lab 10/15/2021 1044  CKTOTAL 120   HbA1C: Hgb A1c MFr Bld  Date/Time Value Ref Range Status  10/25/2021 12:34 PM 6.3 (H) 4.8 - 5.6 % Final    Comment:    (NOTE) Pre diabetes:          5.7%-6.4%  Diabetes:              >6.4%  Glycemic control for   <7.0% adults with diabetes    CBG: Recent Labs  Lab 10/05/2021 1026  GLUCAP 316*   Review of Systems:   Patient is encephalopathic  and/or intubated. Therefore history has been obtained from chart review/family.  Past Medical History:  Afib (on Eliquis), PAD, COPD, tobacco abuse, laryngeal CA (s/p radiation), chronic aspiration, AAA, cerebellar stroke, dementia  Surgical History:  History reviewed. No pertinent surgical history.   Social History:      Family History:  His family history is not on file.   Allergies: No Known Allergies   Home Medications: Prior to Admission medications   Not on File   Pending Chart Merge  Critical care time: 45 minutes   Lestine Mount, PA-C [facility] 10/15/2021 1:21 PM  Please see [email or web address] for pager details.  From 7A-7P if no response, please call [phone] After hours, please call ELink [phone]

## 2021-10-26 NOTE — ED Notes (Signed)
RT called to bedside for assistance with pt's spO2 monitor reading low & she reports it will not correlate accurately d/t his poor perfusion & his ABG oxygen was good & the provider was informed (per RT).

## 2021-10-26 NOTE — Progress Notes (Signed)
Per Dr. Carlis Abbott do not place USGPIV as patient needs central access.

## 2021-10-26 NOTE — Progress Notes (Addendum)
Pharmacy Antibiotic Note  Daniel Chapman is a 88 y.o. male admitted on 10/22/2021 with sepsis.  Pharmacy has been consulted for vancomycin and cefepime dosing.  WBC 13.6, LA >9  Plan: Vancomycin 1g x 1 then 750 mg q48h (eAUC 438,Scr 1.62) Cefepime 2g q24h Flagyl 500 mg q12h per MD Azithromycin 500 mg daily per MD F/u cultures, LOT, and ability to de-escalate     No data recorded.  No results for input(s): WBC, CREATININE, LATICACIDVEN, VANCOTROUGH, VANCOPEAK, VANCORANDOM, GENTTROUGH, GENTPEAK, GENTRANDOM, TOBRATROUGH, TOBRAPEAK, TOBRARND, AMIKACINPEAK, AMIKACINTROU, AMIKACIN in the last 168 hours.  CrCl cannot be calculated (No successful lab value found.).    Not on File  Antimicrobials this admission: 5/30 vancomycin >> (6/5) 5/30 cefepime >> (6/5) 5/30 azithromycin >>  5/30 flagyl >>  Dose adjustments this admission: N/A  Microbiology results: 5/30 BCx: sent 5/30 UCx: sent  5/30 Sputum: sent  5/30 MRSA PCR: sent  Thank you for allowing pharmacy to be a part of this patient's care.  Anderson Malta A Isacc Turney 09/27/2021 10:40 AM

## 2021-10-26 NOTE — ED Notes (Signed)
Patient transported to CT with this RN & RT.

## 2021-10-26 NOTE — Progress Notes (Signed)
I spoke to Ms. Rio and her daughter about the need for central line. They want to ensure that we are not escalating care if he is not going to survive this, but they want to give him his best shot. We discussed the risks and benefits of a CVC, but this could save sticks for blood draws and is a reasonable procedure to continue aggressive care. No plans to escalate care past 1 pressor.  [name], DO 10/14/2021 7:07 PM [facility]

## 2021-10-26 NOTE — ED Provider Notes (Signed)
[facility] EMERGENCY DEPARTMENT Provider Note   CSN: [id number] Arrival date & time: 10/10/2021  1018     History  Chief Complaint  Patient presents with   Unresponsive     Daniel Chapman is a 88 y.o. male.  HPI Patient presents from home by EMS for being found unresponsive.  Last known normal is unknown.  History is very limited.  There was reportedly family on scene.  Patient was initial GCS of 3.  Breathing was assisted with BVM ventilation.  Breathing has been agonal.  Medical history is unknown.  History per wife: Patient has history of stroke and dementia.  He slept throughout the night on Saturday and all day Sunday.  He was able to take in some p.o. fluids Sunday night.  Yesterday, he had a fall and was unable to get up off of the floor.  Eventually, he was placed into a recliner where he stayed all day.  In the evening, he could not get up from his recliner.  He required 2 person assist to get to the bedroom.  He was awake this morning.  Patient's wife went to give him something to drink and when she came back, he was unresponsive.  He was breathing at that time.  911 was called.    Home Medications Prior to Admission medications   Medication Sig Start Date End Date Taking? Authorizing Provider  calcium carbonate (TUMS EX) 750 MG chewable tablet Chew 1 tablet by mouth daily.   Yes [provider]  cholecalciferol (VITAMIN D3) 25 MCG (1000 UNIT) tablet Take 1,000 Units by mouth daily.   Yes [provider]  Cholecalciferol (VITAMIN D3) 50 MCG (2000 UT) TABS Take 50 mcg by mouth daily.   Yes [provider]  cyanocobalamin (,VITAMIN B-12,) 1000 MCG/ML injection Inject 1,000 mcg into the muscle every 30 (thirty) days. 08/16/21  Yes [provider]  donepezil (ARICEPT) 10 MG tablet Take 10 mg by mouth at bedtime. 09/30/21  Yes [provider]  ELIQUIS 2.5 MG TABS tablet Take 2.5 mg by mouth 2 (two) times daily. 09/30/21  Yes  [provider]  memantine (NAMENDA) 10 MG tablet Take 10 mg by mouth 2 (two) times daily. 09/06/21  Yes [provider]  rosuvastatin (CRESTOR) 5 MG tablet Take 5 mg by mouth 3 (three) times a week. Monday, Wednesday, Friday 08/02/21  Yes [provider]      Allergies    Patient has no known allergies.    Review of Systems   Review of Systems  Unable to perform ROS: Patient unresponsive   Physical Exam Updated Vital Signs BP (!) 80/66   Pulse (!) 112   Temp (!) 94.6 F (34.8 C)   Resp (!) 27   Ht '5\' 7"'$  (1.702 m)   Wt 58 kg   SpO2 100%   BMI 20.03 kg/m  Physical Exam Constitutional:      Appearance: He is cachectic. He is ill-appearing.  HENT:     Head: Normocephalic and atraumatic.     Right Ear: External ear normal.     Left Ear: External ear normal.     Nose: Nose normal.     Mouth/Throat:     Mouth: Mucous membranes are moist.     Pharynx: Oropharynx is clear.     Comments: Purulent mucus in oropharynx Eyes:     Conjunctiva/sclera: Conjunctivae normal.     Comments: Pupils 2 mm and unreactive.  Patient does not blink  to threat.  Appears to have a slight right gaze deviation.  Cardiovascular:     Rate and Rhythm: Tachycardia present. Rhythm irregular.     Heart sounds: No murmur heard. Pulmonary:     Comments: Agonal breathing Abdominal:     General: Abdomen is flat. There is no distension.  Musculoskeletal:     Cervical back: Neck supple.     Right lower leg: No edema.     Left lower leg: No edema.  Skin:    General: Skin is cool and dry.     Coloration: Skin is mottled.  Neurological:     GCS: GCS eye subscore is 1. GCS verbal subscore is 1. GCS motor subscore is 1.    ED Results / Procedures / Treatments   Labs (all labs ordered are listed, but only abnormal results are displayed) Labs Reviewed  LACTIC ACID, PLASMA - Abnormal; Notable for the following components:      Result Value   Lactic Acid, Venous >9.0 (*)    All  other components within normal limits  COMPREHENSIVE METABOLIC PANEL - Abnormal; Notable for the following components:   Sodium 132 (*)    Chloride 96 (*)    CO2 17 (*)    Glucose, Bld 319 (*)    BUN 37 (*)    Creatinine, Ser 1.62 (*)    Total Protein 6.4 (*)    Albumin 2.8 (*)    AST 42 (*)    GFR, Estimated 41 (*)    Anion gap 19 (*)    All other components within normal limits  CBC WITH DIFFERENTIAL/PLATELET - Abnormal; Notable for the following components:   WBC 13.6 (*)    RDW 19.2 (*)    Platelets 41 (*)    nRBC 0.7 (*)    Neutro Abs 11.3 (*)    Abs Immature Granulocytes 0.19 (*)    All other components within normal limits  URINALYSIS, ROUTINE W REFLEX MICROSCOPIC - Abnormal; Notable for the following components:   Color, Urine AMBER (*)    APPearance CLOUDY (*)    Glucose, UA >=500 (*)    Hgb urine dipstick MODERATE (*)    Protein, ur 100 (*)    Bacteria, UA RARE (*)    All other components within normal limits  HEMOGLOBIN A1C - Abnormal; Notable for the following components:   Hgb A1c MFr Bld 6.3 (*)    All other components within normal limits  CBG MONITORING, ED - Abnormal; Notable for the following components:   Glucose-Capillary 316 (*)    All other components within normal limits  I-STAT ARTERIAL BLOOD GAS, ED - Abnormal; Notable for the following components:   pH, Arterial 7.120 (*)    pCO2 arterial 58.0 (*)    pO2, Arterial 436 (*)    Bicarbonate 19.1 (*)    TCO2 21 (*)    Acid-base deficit 11.0 (*)    Potassium 3.0 (*)    HCT 36.0 (*)    Hemoglobin 12.2 (*)    All other components within normal limits  TROPONIN I (HIGH SENSITIVITY) - Abnormal; Notable for the following components:   Troponin I (High Sensitivity) 88 (*)    All other components within normal limits  RESP PANEL BY RT-PCR (FLU A&B, COVID) [id number]  CULTURE, BLOOD (ROUTINE X 2)  CULTURE, BLOOD (ROUTINE X 2)  URINE CULTURE  RESPIRATORY PANEL BY PCR  CULTURE, RESPIRATORY W GRAM STAIN   MRSA NEXT GEN BY PCR, NASAL  CK  LACTIC ACID, PLASMA  BLOOD GAS, ARTERIAL  PROTIME-INR  LEGIONELLA PNEUMOPHILA SEROGP 1 UR AG  STREP PNEUMONIAE URINARY ANTIGEN  PROCALCITONIN  BASIC METABOLIC PANEL  TROPONIN I (HIGH SENSITIVITY)    EKG None  Radiology CT HEAD WO CONTRAST  Result Date: 10/25/2021 CLINICAL DATA:  Mental status change, unknown cause EXAM: CT HEAD WITHOUT CONTRAST TECHNIQUE: Contiguous axial images were obtained from the base of the skull through the vertex without intravenous contrast. RADIATION DOSE REDUCTION: This exam was performed according to the departmental dose-optimization program which includes automated exposure control, adjustment of the mA and/or kV according to patient size and/or use of iterative reconstruction technique. COMPARISON:  03/16/2021 FINDINGS: Brain: There is no acute intracranial hemorrhage, mass effect, or edema. Gray-white differentiation is preserved. There is no extra-axial fluid collection. 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 Additional patchy hypoattenuation in the supratentorial white matter is nonspecific but probably reflects chronic microvascular ischemic changes. Prominence of the ventricles and sulci reflects parenchymal volume loss. These findings are similar to the prior study. Vascular: There is atherosclerotic calcification at the skull base. Skull: Calvarium is unremarkable. Sinuses/Orbits: No acute finding. Other: None. IMPRESSION: No evidence of acute intracranial injury. Chronic/nonemergent findings detailed above. Electronically Signed   By: Macy Mis M.D.   On: 10/11/2021 12:16   CT CERVICAL SPINE WO CONTRAST  Result Date: 10/24/2021 CLINICAL DATA:  Neck trauma, intoxicated or obtunded (Age >= 16y) EXAM: CT CERVICAL SPINE WITHOUT CONTRAST TECHNIQUE: Multidetector CT imaging of the cervical spine was performed without intravenous contrast. Multiplanar CT image reconstructions were also generated. RADIATION  DOSE REDUCTION: This exam was performed according to the departmental dose-optimization program which includes automated exposure control, adjustment of the mA and/or kV according to patient size and/or use of iterative reconstruction technique. COMPARISON:  None Available. FINDINGS: Alignment: Trace retrolisthesis at C3-C4. Skull base and vertebrae: No acute cervical spine fracture. Multilevel degenerative endplate irregularity. Chronic T2 and T3 superior endplate compression fractures. Soft tissues and spinal canal: No prevertebral fluid or swelling. No visible canal hematoma. Disc levels: Multilevel degenerative changes are present including disc space narrowing, endplate osteophytes, and facet and uncovertebral hypertrophy. Moderate to marked canal stenosis at C3-C4 and C5-C6. Upper chest: Dictated separately. Other: Endotracheal and enteric tubes are present. Calcified plaque no acute at the ICA origins. IMPRESSION: No acute cervical spine fracture. Electronically Signed   By: Macy Mis M.D.   On: 09/27/2021 12:23   DG Chest Portable 1 View  Result Date: 10/03/2021 CLINICAL DATA:  Endotracheal tube advancement EXAM: PORTABLE CHEST 1 VIEW COMPARISON:  Portable exam 1136 hours compared to 10/07/2021 FINDINGS: Tip of endotracheal tube projects 4.6 cm above carina. Tip of nasogastric tube projects over distal esophagus, recommend advancing tube 12 cm to place proximal side-port within stomach. Normal heart size, mediastinal contours, and pulmonary vascularity. Bronchitic changes with patchy RIGHT lung infiltrates slightly increased versus earlier study. No pleural effusion or pneumothorax. IMPRESSION: Recommend advancing nasogastric tube 12 cm to place proximal side-port within stomach. Patchy RIGHT lung infiltrates, slightly increased. Electronically Signed   By: Lavonia Dana M.D.   On: 10/09/2021 11:51   DG Chest Port 1 View  Result Date: 10/13/2021 CLINICAL DATA:  Question sepsis.  Intubation EXAM:  PORTABLE CHEST 1 VIEW COMPARISON:  02/06/2017 FINDINGS: Endotracheal tube in good position. NG tip of the GE junction with the side hole distal esophagus. Recommend advancing 15 cm. Patchy airspace disease right upper lobe and right lower lobe. Left lung clear. Probable underlying  COPD. Pulmonary vascularity normal. No significant pleural effusion. Pleural thickening left costophrenic angle. IMPRESSION: Patchy right upper lobe and right lower lobe infiltrate, probable pneumonia Endotracheal tube in good position NG tube in the distal esophagus, recommend advancing 15 cm. Electronically Signed   By: Franchot Gallo M.D.   On: 10/20/2021 10:49   CT CHEST ABDOMEN PELVIS WO CONTRAST  Result Date: 10/18/2021 CLINICAL DATA:  Fall. Chest and abdominal blunt trauma and pain. Hypoxia. EXAM: CT CHEST, ABDOMEN AND PELVIS WITHOUT CONTRAST TECHNIQUE: Multidetector CT imaging of the chest, abdomen and pelvis was performed following the standard protocol without IV contrast. RADIATION DOSE REDUCTION: This exam was performed according to the departmental dose-optimization program which includes automated exposure control, adjustment of the mA and/or kV according to patient size and/or use of iterative reconstruction technique. COMPARISON:  Chest CT on 02/08/2017, and AP CT on 09/10/2021. FINDINGS: CT CHEST FINDINGS Cardiovascular: No evidence of mediastinal hematoma. No pericardial effusion. Aortic and coronary atherosclerotic calcification incidentally noted. Mediastinum/Nodes: No evidence of hemorrhage or pneumomediastinum. No masses or pathologically enlarged lymph nodes identified on this noncontrast exam. Nasogastric tube is seen with tip in the distal esophagus. Lungs/Pleura: Endotracheal tube is seen in place. Severe centrilobular emphysema again seen. Biapical pleural-parenchymal scarring is again demonstrated. New airspace disease is seen in the right upper, middle, and lower lobes, and to a lesser degree the superior  left lower lobe. This is likely due to pneumonia or aspiration, with pulmonary contusion considered less likely. No evidence of pneumothorax or hemothorax. Musculoskeletal: No acute fractures or suspicious bone lesions identified. Chronic appearing compression fractures of several mid and lower thoracic vertebral bodies are noted. CT ABDOMEN PELVIS FINDINGS Hepatobiliary: No hepatic parenchymal injury or mass identified on this noncontrast exam. Gallbladder is unremarkable. No evidence of biliary ductal dilatation. Pancreas: No parenchymal abnormality identified on this noncontrast exam. Spleen: Prior splenectomy. Adrenal/Urinary Tract: No hemorrhage or parenchymal injury identified on this noncontrast exam. Foley catheter seen within the bladder. Stomach/Bowel: Unopacified bowel loops are unremarkable in appearance. No evidence of hemoperitoneum. Vascular/Lymphatic: No evidence of retroperitoneal hemorrhage. No pathologically enlarged lymph nodes identified. Aortic atherosclerotic calcification incidentally noted. Reproductive: Limited visualization of inferior pelvis due to severe artifact from right hip prosthesis. No mass or other significant abnormality identified. Other:  None. Musculoskeletal: No acute fractures or suspicious bone lesions identified. Chronic appearing compression fractures of the L3 and L5 vertebral bodies are again noted. IMPRESSION: Bilateral multilobar airspace disease, involving right lung greater than left. This is likely due to pneumonia or aspiration, with pulmonary contusion considered less likely. No acute findings within the abdomen or pelvis. Nasogastric tube tip in distal esophagus. Aortic Atherosclerosis (ICD10-I70.0) and Emphysema (ICD10-J43.9). Electronically Signed   By: Marlaine Hind M.D.   On: 10/22/2021 12:30    Procedures Procedure Name: Intubation Date/Time: 10/05/2021 10:40 AM Performed by: Godfrey Pick, MD Pre-anesthesia Checklist: Emergency Drugs available, Suction  available and Patient being monitored Oxygen Delivery Method: Ambu bag Preoxygenation: Pre-oxygenation with 100% oxygen Induction Type: Rapid sequence Ventilation: Mask ventilation without difficulty Laryngoscope Size: Glidescope and 4 Grade View: Grade III Tube size: 7.5 mm Number of attempts: 1 Airway Equipment and Method: Rigid stylet and Video-laryngoscopy Placement Confirmation: ETT inserted through vocal cords under direct vision, CO2 detector and Breath sounds checked- equal and bilateral Secured at: 21 cm Tube secured with: ETT holder Dental Injury: Teeth and Oropharynx as per pre-operative assessment        Medications Ordered in ED Medications  docusate (COLACE) 50 MG/5ML  liquid 100 mg (100 mg Per Tube Not Given 10/02/2021 1046)  polyethylene glycol (MIRALAX / GLYCOLAX) packet 17 g (17 g Per Tube Not Given 10/14/2021 1046)  lactated ringers infusion ( Intravenous New Bag/Given 10/23/2021 1109)  0.9 %  sodium chloride infusion (250 mLs Intravenous New Bag/Given 10/06/2021 1047)  norepinephrine (LEVOPHED) '4mg'$  in 249m (0.016 mg/mL) premix infusion (0 mcg/min Intravenous Stopped 10/20/2021 1054)  fentaNYL 25087m in NS 25059m56m6ml) infusion-PREMIX (75 mcg/hr Intravenous Rate/Dose Change 10/01/2021 1255)  fentaNYL (SUBLIMAZE) bolus via infusion 25-100 mcg (has no administration in time range)  azithromycin (ZITHROMAX) 500 mg in sodium chloride 0.9 % 250 mL IVPB (500 mg Intravenous New Bag/Given 10/25/2021 1226)  insulin aspart (novoLOG) injection 0-15 Units (11 Units Subcutaneous Given 10/20/2021 1229)  ceFEPIme (MAXIPIME) 2 g in sodium chloride 0.9 % 100 mL IVPB (has no administration in time range)  pantoprazole sodium (PROTONIX) 40 mg/20 mL oral suspension 40 mg (has no administration in time range)  vancomycin (VANCOREADY) IVPB 750 mg/150 mL (has no administration in time range)  ipratropium-albuterol (DUONEB) 0.5-2.5 (3) MG/3ML nebulizer solution 3 mL (has no administration in time range)   metroNIDAZOLE (FLAGYL) IVPB 500 mg (has no administration in time range)  lactated ringers bolus 1,000 mL (has no administration in time range)  0.9 %  sodium chloride infusion (0 mLs Intravenous Stopped 10/18/2021 1254)  etomidate (AMIDATE) injection (10 mg Intravenous Given 10/17/2021 1021)  rocuronium (ZEMURON) injection (50 mg Intravenous Given 10/24/2021 1022)  fentaNYL (SUBLIMAZE) 100 MCG/2ML injection (100 mcg  Given 10/05/2021 1037)  PHENYLephrine 80 mcg/ml in normal saline Adult IV Push Syringe (For Blood Pressure Support) (80 mcg Intravenous Given 09/29/2021 1029)  lactated ringers bolus 1,000 mL (0 mLs Intravenous Stopped 10/17/2021 1114)    And  lactated ringers bolus 1,000 mL (0 mLs Intravenous Stopped 10/03/2021 1127)    And  lactated ringers bolus 250 mL (0 mLs Intravenous Stopped 10/13/2021 1127)  ceFEPIme (MAXIPIME) 2 g in sodium chloride 0.9 % 100 mL IVPB (0 g Intravenous Stopped 09/27/2021 1110)  metroNIDAZOLE (FLAGYL) IVPB 500 mg (0 mg Intravenous Stopped 09/27/2021 1215)  vancomycin (VANCOCIN) IVPB 1000 mg/200 mL premix (0 mg Intravenous Stopped 10/27/2021 1215)    ED Course/ Medical Decision Making/ A&P                           Medical Decision Making Amount and/or Complexity of Data Reviewed Labs: ordered. Radiology: ordered. ECG/medicine tests: ordered.  Risk OTC drugs. Prescription drug management. Decision regarding hospitalization.   This patient presents to the ED for concern of unresponsiveness, this involves an extensive number of treatment options, and is a complaint that carries with it a high risk of complications and morbidity.  The differential diagnosis includes sepsis, CVA, ICH, polypharmacy, CAD   Co morbidities that complicate the patient evaluation  COPD, CVA, dementia, atrial fibrillation, laryngeal cancer s/p radiation   Additional history obtained:  Additional history obtained from EMS, patient's wife External records from outside source obtained and reviewed  including EMR   Lab Tests:  I Ordered, and personally interpreted labs.  The pertinent results include: Severe mixed acidosis on blood gas, AKI, UTI, mild elevation in troponin, presence of leukocytosis, normal hemoglobin, new thrombocytopenia, severe lactic acidosis   Imaging Studies ordered:  I ordered imaging studies including chest x-ray, CT head, CT cervical spine, CT of chest, abdomen, and pelvis I independently visualized and interpreted imaging which showed severe bilateral airspace disease, greater on  the right; no acute injuries I agree with the radiologist interpretation   Cardiac Monitoring: / EKG:  The patient was maintained on a cardiac monitor.  I personally viewed and interpreted the cardiac monitored which showed an underlying rhythm of: Atrial fibrillation   Consultations Obtained:  I requested consultation with the intensivist,  and discussed lab and imaging findings as well as pertinent plan - they recommend: ICU admission   Problem List / ED Course / Critical interventions / Medication management  Patient is a 88 year old male who presents from home for unresponsiveness.  Onset was this morning.  Prior to that, he had several days of severe fatigue and weakness.  History is provided initially by EMS and subsequently by his wife.  His wife is able to detail his recent symptoms.  He did have 1 fall which sounds to be low mechanism.  When he fell, he was unable to get up off of the floor due to generalized weakness.  He has been essentially immobile for the past 2 days due to fatigue and weakness.  When EMS arrived on scene, he was a GCS of 3.  They assisted his breathing with BVM during transit.  On arrival, patient remains a GCS of 3 with agonal breathing.  There is evidence of purulent sputum in his oropharynx.  On exam, he has cool and mottled extremities.  He is chronically ill and cachectic in appearance.  There is no external evidence of trauma.  Due to poor  peripheral perfusion, SPO2 is difficult to ascertain.  He is found to be tachycardic with an irregular rhythm and a rate of approximately 140.  Given paucity of history and no confirmed CODE STATUS on arrival, patient was intubated for airway protection and respiratory failure.  RSI medications were used.  Patient had.  The patient hypotension and push dose phenylephrine was given prior to starting Levophed gtt.  IV fluids were initially started upon arrival as well.  Patient was started on IV fentanyl gtt. for post intubation sedation.  Initial suspicion is for sepsis.  Septic work-up was initiated and the patient was given additional IV fluids and broad-spectrum antibiotics.  Shortly thereafter, patient's wife arrived and was able to tell further story, which also was concerning for sepsis given his severe generalized fatigue and weakness.  Initial lab work shows a severe mixed acidosis, a severe lactic acidosis, a leukocytosis, and AKI, all consistent with severe sepsis.  X-ray shows right-sided pneumonia.  Given the limited history, patient did undergo pan scans.  CT scans were notable for multiple pneumonia, greater on the right.  There may have been an aspiration event.  Regardless, patient remains on broad-spectrum antibiotics at this time.  While on the ventilator, patient had initial continued hypoxia.  FiO2 was maintained at 100% and patient's PEEP was increased from 5-10.  After this, he was able to maintain SPO2 in mid 90s.  His blood pressure normalized and he was able to be weaned off of Levophed.  On reassessment, patient had improved warmth and color.  His wife states that he chronically has poor peripheral perfusion and purple skin on his extremities.  ICU was consulted for admission. I ordered medication including IV fluids and broad-spectrum antibiotics for treatment of sepsis; etomidate and rocuronium for RSI; and left Levophed for transient hypotension; fentanyl for post sedation  analgesia Reevaluation of the patient after these medicines showed that the patient improved I have reviewed the patients home medicines and have made adjustments as needed  Social Determinants of Health:  Lives at home with wife  CRITICAL CARE Performed by: Godfrey Pick   Total critical care time: 65 minutes  Critical care time was exclusive of separately billable procedures and treating other patients.  Critical care was necessary to treat or prevent imminent or life-threatening deterioration.  Critical care was time spent personally by me on the following activities: development of treatment plan with patient and/or surrogate as well as nursing, discussions with consultants, evaluation of patient's response to treatment, examination of patient, obtaining history from patient or surrogate, ordering and performing treatments and interventions, ordering and review of laboratory studies, ordering and review of radiographic studies, pulse oximetry and re-evaluation of patient's condition.         Final Clinical Impression(s) / ED Diagnoses Final diagnoses:  Multifocal pneumonia  Severe sepsis ([place])  Acute respiratory failure with hypoxia and hypercapnia ([place])  Unresponsiveness  AKI (acute kidney injury) [facility])    Rx / DC Orders ED Discharge Orders     None         Godfrey Pick, MD 10/23/2021 1305

## 2021-10-26 NOTE — ED Notes (Signed)
Pt transferring upstairs to [id number] at this time with this RN & RT.

## 2021-10-26 NOTE — Progress Notes (Signed)
eLink Physician-Brief Progress Note Patient Name: Mutasim Tuckey DOB: 14-Apr-1932 MRN: [id number]   Date of Service  10/23/2021  HPI/Events of Note  Multiple issues: 1. Fever to 101.8 F - AST elevated. Hypotension - BP = 76/54. Last pH 7.133. Now on NaHCO3 IV infusion. Last Hgb = 13.3.  eICU Interventions  Plan: Ice packs and cooling blanket PRN. Monitor CVP now and Q 4 hours. 25% Albumin 25 gm IV X 1 now.  NaHCO3 100 meq IV X 1 now.  ABG at 10 PM.     Intervention Category Major Interventions: Other:;Hypotension - evaluation and management  Eliazer Hemphill Cornelia Copa 10/27/2021, 7:59 PM

## 2021-10-26 NOTE — Progress Notes (Signed)
[facility]) Hospital Liaison note:  This patient is currently enrolled in [facility] outpatient-based Palliative Care. Will continue to follow for disposition.  Please call with any outpatient palliative questions or concerns.  Thank you, Lorelee Market, LPN [facility] Liaison [phone]

## 2021-10-26 NOTE — ED Notes (Signed)
Intubated: 23 @ the lip

## 2021-10-26 NOTE — ED Notes (Signed)
Wife at bedside & reports to the provider at bedside that pt did fall yesterday & hit head on the ground, he is on Eliquis.

## 2021-10-26 NOTE — ED Notes (Signed)
Report given to Charge: Gaffer on [place] for Rm 07.

## 2021-10-26 NOTE — Procedures (Signed)
Central Venous Catheter Insertion Procedure Note  Daniel Chapman  [id number]  January 19, 1932  Date:10/12/2021  Time:6:06 PM   Provider Performing:Linzy Darling Naomie Dean   Procedure: Insertion of Non-tunneled Central Venous [phone]) with US guidance ([id number])   Indication(s) Medication administration  Consent Risks of the procedure as well as the alternatives and risks of each were explained to the patient and/or caregiver.  Consent for the procedure was obtained and is signed in the bedside chart. Wife provided consent.  Anesthesia Topical only with 1% lidocaine   Timeout Verified patient identification, verified procedure, site/side was marked, verified correct patient position, special equipment/implants available, medications/allergies/relevant history reviewed, required imaging and test results available.  Sterile Technique Maximal sterile technique including full sterile barrier drape, hand hygiene, sterile gown, sterile gloves, mask, hair covering, sterile ultrasound probe cover (if used).  Procedure Description    Area of catheter insertion was cleaned with chlorhexidine and draped in sterile fashion.  With real-time ultrasound guidance a central venous catheter was placed into the right internal jugular vein. Guidewire confirmed in vessel on [place]. Nonpulsatile blood flow and easy flushing noted in all ports.  The catheter was sutured in place and sterile dressing applied.  Complications/Tolerance None; patient tolerated the procedure well. Chest X-ray is ordered to verify placement for internal jugular or subclavian cannulation.   Chest x-ray is not ordered for femoral cannulation.  EBL Minimal  Specimen(s) None  [name], DO 10/21/2021 6:08 PM [facility]

## 2021-10-26 NOTE — TOC Progression Note (Signed)
Transition of Care [facility]) - Initial/Assessment Note    Patient Details  Name: Daniel Chapman MRN: [id number] Date of Birth: 09/01/1931  Transition of Care [facility]) CM/SW Contact:    Milinda Antis, LCSWA Phone Number: 10/13/2021, 4:10 PM  Clinical Narrative:                  Transition of Care Department [facility]) has reviewed patient.  Patient if from home with wife admitted for Septic shock due to pneumonia with history of vascular dementia and COPD.  Patient active with [facility] outpatient based Palliative Care.  Patient is currently intubated.  We will continue to monitor patient advancement through interdisciplinary progression rounds. If new patient transition needs arise, please place a TOC consult.          Patient Goals and CMS Choice        Expected Discharge Plan and Services                                                Prior Living Arrangements/Services                       Activities of Daily Living      Permission Sought/Granted                  Emotional Assessment              Admission diagnosis:  AKI (acute kidney injury) ([place]) [N17.9] Unresponsiveness [R41.89] Sepsis ([place]) [A41.9] Severe sepsis ([place]) [A41.9, R65.20] Acute respiratory failure with hypoxia and hypercapnia ([place]) [J96.01, J96.02] Multifocal pneumonia [J18.9] Patient Active Problem List   Diagnosis Date Noted   Sepsis ([place]) 10/05/2021   PCP:  Johna Roles, PA Pharmacy:   [facility] [address] [place] [address] Phone: [phone] Fax: [phone]     Social Determinants of Health (SDOH) Interventions    Readmission Risk Interventions     View : No data to display.

## 2021-10-26 NOTE — Sepsis Progress Note (Signed)
eLink is following this Code Sepsis. °

## 2021-10-26 NOTE — IPAL (Signed)
  Interdisciplinary Goals of Care Family Meeting   Date carried out:: 10/16/2021  Location of the meeting: Bedside  Member's involved: Physician, Bedside Registered Nurse, Family Member or next of kin, and Other: PA- [place] of Attorney or Loss adjuster, chartered: wife, has advanced directive form at bedside    Discussion: We discussed goals of care for Daniel Chapman .  His wife and daughter provided history of indicated that last week at his PCP's office they had planned to discuss and sign a DNR form, but that they ran out of time during the visit. She indicated that he would want to be DNR, but wants to continue acute care. His advanced directive indicated he would want to die a natural death in the event of an irreversible or uncurable condition.  Code status: Full DNR  Disposition: Continue current acute care   Time spent for the meeting: 10 min.  [name] 10/13/2021, 12:40 PM

## 2021-10-26 NOTE — Progress Notes (Signed)
Now on NE 10, PICC consult placed.   Still air trapping, autoPEEP ~10. Vent decreased to rate 28, CAT ordered.  Updated wife and daughter at bedside that I worry this will not be a survivable illness. They have told me that he is very frail at baseline.   [name], DO 09/28/2021 4:43 PM [place] [facility]

## 2021-10-26 NOTE — ED Notes (Signed)
ECHO at bedside.

## 2021-10-26 NOTE — Progress Notes (Signed)
Patient transported to CT and back to trauma room without complications. RN at bedside.

## 2021-10-26 NOTE — ED Notes (Signed)
Noemi Chapel, DO informed of pt's pressure dropping & his temp being low with the pressure being to low to support the bare hugger.

## 2021-10-26 NOTE — Progress Notes (Signed)
Patient transported to 3M07 from ED without complications. RN at bedside.

## 2021-10-27 ENCOUNTER — Inpatient Hospital Stay (HOSPITAL_COMMUNITY): Payer: Medicare Other

## 2021-10-27 DIAGNOSIS — Z515 Encounter for palliative care: Secondary | ICD-10-CM

## 2021-10-27 DIAGNOSIS — E43 Unspecified severe protein-calorie malnutrition: Secondary | ICD-10-CM | POA: Insufficient documentation

## 2021-10-27 DIAGNOSIS — J9601 Acute respiratory failure with hypoxia: Secondary | ICD-10-CM | POA: Diagnosis not present

## 2021-10-27 DIAGNOSIS — Z9911 Dependence on respirator [ventilator] status: Secondary | ICD-10-CM | POA: Diagnosis not present

## 2021-10-27 DIAGNOSIS — Z7189 Other specified counseling: Secondary | ICD-10-CM | POA: Diagnosis not present

## 2021-10-27 LAB — COMPREHENSIVE METABOLIC PANEL
ALT: 24 U/L (ref 0–44)
AST: 42 U/L — ABNORMAL HIGH (ref 15–41)
Albumin: 2.1 g/dL — ABNORMAL LOW (ref 3.5–5.0)
Alkaline Phosphatase: 37 U/L — ABNORMAL LOW (ref 38–126)
Anion gap: 12 (ref 5–15)
BUN: 30 mg/dL — ABNORMAL HIGH (ref 8–23)
CO2: 26 mmol/L (ref 22–32)
Calcium: 7.8 mg/dL — ABNORMAL LOW (ref 8.9–10.3)
Chloride: 100 mmol/L (ref 98–111)
Creatinine, Ser: 1.34 mg/dL — ABNORMAL HIGH (ref 0.61–1.24)
GFR, Estimated: 51 mL/min — ABNORMAL LOW (ref >60)
Glucose, Bld: 175 mg/dL — ABNORMAL HIGH (ref 70–99)
Potassium: 3.2 mmol/L — ABNORMAL LOW (ref 3.5–5.1)
Sodium: 138 mmol/L (ref 135–145)
Total Bilirubin: 1.5 mg/dL — ABNORMAL HIGH (ref 0.3–1.2)
Total Protein: 4.3 g/dL — ABNORMAL LOW (ref 6.5–8.1)

## 2021-10-27 LAB — URINE CULTURE: Culture: NO GROWTH

## 2021-10-27 LAB — CBC
HCT: 33.1 % — ABNORMAL LOW (ref 39.0–52.0)
Hemoglobin: 10.9 g/dL — ABNORMAL LOW (ref 13.0–17.0)
MCH: 28.5 pg (ref 26.0–34.0)
MCHC: 32.9 g/dL (ref 30.0–36.0)
MCV: 86.6 fL (ref 80.0–100.0)
Platelets: 25 10*3/uL — CL (ref 150–400)
RBC: 3.82 MIL/uL — ABNORMAL LOW (ref 4.22–5.81)
RDW: 18.5 % — ABNORMAL HIGH (ref 11.5–15.5)
WBC: 7.5 10*3/uL (ref 4.0–10.5)
nRBC: 1.3 % — ABNORMAL HIGH (ref 0.0–0.2)

## 2021-10-27 LAB — GLUCOSE, CAPILLARY
Glucose-Capillary: 115 mg/dL — ABNORMAL HIGH (ref 70–99)
Glucose-Capillary: 118 mg/dL — ABNORMAL HIGH (ref 70–99)
Glucose-Capillary: 119 mg/dL — ABNORMAL HIGH (ref 70–99)
Glucose-Capillary: 122 mg/dL — ABNORMAL HIGH (ref 70–99)
Glucose-Capillary: 137 mg/dL — ABNORMAL HIGH (ref 70–99)
Glucose-Capillary: 140 mg/dL — ABNORMAL HIGH (ref 70–99)
Glucose-Capillary: 70 mg/dL (ref 70–99)

## 2021-10-27 LAB — PHOSPHORUS: Phosphorus: 3.6 mg/dL (ref 2.5–4.6)

## 2021-10-27 LAB — MAGNESIUM
Magnesium: 1.3 mg/dL — ABNORMAL LOW (ref 1.7–2.4)
Magnesium: 2.8 mg/dL — ABNORMAL HIGH (ref 1.7–2.4)

## 2021-10-27 MED ORDER — POTASSIUM CHLORIDE 10 MEQ/50ML IV SOLN
10.0000 meq | INTRAVENOUS | Status: AC
  Administered 2021-10-27 (×4): 10 meq via INTRAVENOUS
  Filled 2021-10-27 (×4): qty 50

## 2021-10-27 MED ORDER — CEFEPIME HCL 2 G IV SOLR
2.0000 g | Freq: Two times a day (BID) | INTRAVENOUS | Status: DC
Start: 2021-10-27 — End: 2021-10-27

## 2021-10-27 MED ORDER — PREDNISONE 20 MG PO TABS
40.0000 mg | ORAL_TABLET | Freq: Every day | ORAL | Status: DC
  Administered 2021-10-28: 40 mg
  Filled 2021-10-27 (×2): qty 2

## 2021-10-27 MED ORDER — CEFTRIAXONE SODIUM 1 G IJ SOLR
1.0000 g | INTRAMUSCULAR | Status: DC
  Administered 2021-10-27 – 2021-10-28 (×2): 1 g via INTRAVENOUS
  Filled 2021-10-27 (×2): qty 10

## 2021-10-27 MED ORDER — POTASSIUM CHLORIDE 20 MEQ PO PACK
20.0000 meq | PACK | ORAL | Status: AC
  Administered 2021-10-27 (×2): 20 meq
  Filled 2021-10-27 (×3): qty 1

## 2021-10-27 MED ORDER — VANCOMYCIN HCL 750 MG/150ML IV SOLN
750.0000 mg | INTRAVENOUS | Status: DC
  Filled 2021-10-27: qty 150

## 2021-10-27 MED ORDER — REVEFENACIN 175 MCG/3ML IN SOLN
175.0000 ug | Freq: Every day | RESPIRATORY_TRACT | Status: DC
  Administered 2021-10-27: 175 ug via RESPIRATORY_TRACT
  Filled 2021-10-27: qty 3

## 2021-10-27 MED ORDER — DEXTROSE 50 % IV SOLN
INTRAVENOUS | Status: AC
  Filled 2021-10-27: qty 50

## 2021-10-27 MED ORDER — MAGNESIUM SULFATE 4 GM/100ML IV SOLN
4.0000 g | Freq: Once | INTRAVENOUS | Status: AC
  Administered 2021-10-27: 4 g via INTRAVENOUS
  Filled 2021-10-27: qty 100

## 2021-10-27 MED ORDER — IPRATROPIUM-ALBUTEROL 0.5-2.5 (3) MG/3ML IN SOLN: 3.0000 mL | RESPIRATORY_TRACT | Status: DC | PRN

## 2021-10-27 MED ORDER — OSMOLITE 1.2 CAL PO LIQD
1000.0000 mL | ORAL | Status: DC
  Administered 2021-10-27 – 2021-10-28 (×2): 1000 mL
  Filled 2021-10-27 (×4): qty 1000

## 2021-10-27 MED ORDER — MAGNESIUM SULFATE 2 GM/50ML IV SOLN
2.0000 g | Freq: Once | INTRAVENOUS | Status: AC
  Administered 2021-10-27: 2 g via INTRAVENOUS
  Filled 2021-10-27: qty 50

## 2021-10-27 MED ORDER — CHLORHEXIDINE GLUCONATE CLOTH 2 % EX PADS
6.0000 | MEDICATED_PAD | Freq: Every day | CUTANEOUS | Status: DC
  Administered 2021-10-27 – 2021-10-29 (×3): 6 via TOPICAL

## 2021-10-27 MED ORDER — ARFORMOTEROL TARTRATE 15 MCG/2ML IN NEBU
15.0000 ug | INHALATION_SOLUTION | Freq: Two times a day (BID) | RESPIRATORY_TRACT | Status: DC
  Administered 2021-10-27 – 2021-10-29 (×5): 15 ug via RESPIRATORY_TRACT
  Filled 2021-10-27 (×5): qty 2

## 2021-10-27 NOTE — Progress Notes (Signed)
eLink Physician-Brief Progress Note Patient Name: Daniel Chapman DOB: September 23, 1931 MRN: [id number]   Date of Service  10/27/2021  HPI/Events of Note  Multiple issues: 1. Notified of Platelet count = 25. Would transfuse of active bleeding or platelet count < 10. 2. Calcium - 7.8 which corrects to 9.32 (normal) given Albumin = 2.1.  eICU Interventions  Continue present management.     Intervention Category Major Interventions: Other:  Daniel Chapman Cornelia Copa 10/27/2021, 6:06 AM

## 2021-10-27 NOTE — Progress Notes (Signed)
Initial Nutrition Assessment  DOCUMENTATION CODES:   Severe malnutrition in context of chronic illness  INTERVENTION:   Tube feeding via OG tube: - Change to Osmolite 1.2 @ 40 ml/hr and advance by 10 ml q 6 hours to goal rate of 60 ml/hr (1440 ml/day)  Tube feeding regimen at goal rate provides 1728 kcal, 80 grams of protein, and 1181 ml of H2O.  Monitor magnesium, potassium, and phosphorus BID for at least 3 days, MD to replete as needed, as pt is at risk for refeeding syndrome given severe malnutrition.  NUTRITION DIAGNOSIS:   Severe Malnutrition related to chronic illness (COPD, dementia, laryngeal cancer with chronic aspiration) as evidenced by severe muscle depletion, severe fat depletion.  GOAL:   Patient will meet greater than or equal to 90% of their needs  MONITOR:   Vent status, Labs, Weight trends, TF tolerance  REASON FOR ASSESSMENT:   Ventilator, Consult Enteral/tube feeding initiation and management  ASSESSMENT:   88 year old male who presented to the ED on 5/30 after being found unresponsive. PMH of stroke, vascular dementia, COPD, ITP, laryngeal cancer s/p radiation, chronic aspiration, AAA, tobacco abuse, atrial fibrillation, PAD. Pt admitted with acute respiratory failure due to multilobar pneumonia, septic shock, AKI. Pt required intubation in the ED.  Discussed pt with RN and during ICU rounds. Palliative Medicine team is following pt regarding [place].  Consult received for enteral nutrition initiation and management. Pt with OG tube in stomach per abdominal x-ray yesterday.  Spoke with pt's wife at bedside. Pt's wife shares that pt's PO intake and weight have both gradually declined but especially over the last month. For example, pt used to eat 2 eggs with 2 pieces of toast for breakfast. Now, pt only eats 1-2 eggs for breakfast and this may be the only food he eats in a day other than cookies. Pt may have a few bites of lunch and a few bites of dinner with  significant encouragement. Family cooks and brings him all of his favorite foods but pt just does not eat. Pt drinks a little water but mostly sweet tea.  Pt's wife shares that pt initially lost a lot of weight with cancer treatments but had been maintaining around 122 lbs until 1 month ago when he started losing weight again. Now he is down to 109-110 lbs. No weight history available in chart. Pt does meet criteria for severe malnutrition based on NFPE.  Admit weight: 58 kg Current weight: 57.2 kg  Patient is currently intubated on ventilator support MV: 14.5 L/min Temp (24hrs), Avg:98 F (36.7 C), Min:94.1 F (34.5 C), Max:102.2 F (39 C) BP (cuff): 87/64 MAP (cuff): 73  Drips: Fentanyl Levophed  Medications reviewed and include: colace, SSI q 4 hours, IV solu-medrol, protonix, miralax, klor-con 20 mEq x 2, prednisone, IV abx, IV magnesium sulfate 2 grams once, IV KCl 10 mEq x 4 runs  Labs reviewed: potassium 3.2, BUN 30, creatinine 1.34, magnesium 1.3, platelets 25, hemoglobin A1C 6.3 CBG's: 45-147 x 24 hours  I/O's: +4.9 L since admit  NUTRITION - FOCUSED PHYSICAL EXAM:  Flowsheet Row Most Recent Value  Orbital Region Severe depletion  Upper Arm Region Severe depletion  Thoracic and Lumbar Region Severe depletion  Buccal Region Unable to assess  Temple Region Severe depletion  Clavicle Bone Region Severe depletion  Clavicle and Acromion Bone Region Severe depletion  Scapular Bone Region Unable to assess  Dorsal Hand Severe depletion  Patellar Region Severe depletion  Anterior Thigh Region Severe depletion  Posterior Calf Region Severe depletion  Edema (RD Assessment) None  Hair Reviewed  Eyes Reviewed  Mouth Reviewed  Skin Reviewed  Nails Reviewed       Diet Order:   Diet Order             Diet NPO time specified  Diet effective now                   EDUCATION NEEDS:   Not appropriate for education at this time  Skin:  Skin Assessment: Reviewed  RN Assessment  Last BM:  no documented BM  Height:   Ht Readings from Last 1 Encounters:  10/13/2021 '5\' 7"'$  (1.702 m)    Weight:   Wt Readings from Last 1 Encounters:  10/27/21 57.2 kg    BMI:  Body mass index is 19.75 kg/m.  Estimated Nutritional Needs:   Kcal:  1650-1850  Protein:  80-95 grams  Fluid:  1.6-1.8 L    Gustavus Bryant, MS, RD, LDN Inpatient Clinical Dietitian Please see AMiON for contact information.

## 2021-10-27 NOTE — Progress Notes (Signed)
[facility] ADULT ICU REPLACEMENT PROTOCOL   The patient does apply for the [facility] Adult ICU Electrolyte Replacment Protocol based on the criteria listed below:   1.Exclusion criteria: TCTS patients, ECMO patients, and Dialysis patients 2. Is GFR >/= 30 ml/min? Yes.    Patient's GFR today is 51 3. Is SCr </= 2? Yes.   Patient's SCr is 1.34 mg/dL 4. Did SCr increase >/= 0.5 in 24 hours? No. 5.Pt's weight >40kg  Yes.   6. Abnormal electrolyte(s): mag 1.3, K+ 3.2  7. Electrolytes replaced per protocol 8.  Call MD STAT for K+ </= 2.5, Phos </= 1, or Mag </= 1 Physician:  n/a  Darlys Gales 10/27/2021 5:57 AM

## 2021-10-27 NOTE — Progress Notes (Signed)
Pharmacy Antibiotic Note  Daniel Chapman is a 88 y.o. male admitted on 09/30/2021 with sepsis secondary to PNA. Pharmacy has been consulted for vancomycin and cefepime dosing.  Patient is also on azithromycin and Flagyl.  Renal function improving, now afebrile and WBC normalized.  Plan: Increase vanc to '750mg'$  IV Q24H for AUC 476 using SCr 1.34 Increase cefepime to 2g Q12H Flagyl '500mg'$  IV Q12H and azith '500mg'$  IV Q24H per MD Monitor renal fxn, clinical progress, vanc levels vs narrowing  Height: '5\' 7"'$  (170.2 cm) Weight: 57.2 kg (126 lb 1.7 oz) IBW/kg (Calculated) : 66.1  Temp (24hrs), Avg:97.9 F (36.6 C), Min:94.1 F (34.5 C), Max:102.2 F (39 C)  Recent Labs  Lab 10/16/2021 1044 09/28/2021 1233 10/07/2021 1554 10/27/21 0512  WBC 13.6*  --   --  7.5  CREATININE 1.62*  --  1.29* 1.34*  LATICACIDVEN >9.0* 7.9*  --   --     Estimated Creatinine Clearance: 30.8 mL/min (A) (by C-G formula based on SCr of 1.34 mg/dL (H)).    No Known Allergies  Vanc 5/30 >> (6/5) Cefepime 5/30 >> (6/5) Flagyl 5/30 >> Azith 5/30 >>   5/30 BCx -  5/30 UCx -  5/30 TA -  5/30 MRSA PCR - negative 5/30 resp panel PCR - negative   Consuello Lassalle D. Mina Marble, PharmD, BCPS, [place] 10/27/2021, 7:26 AM

## 2021-10-27 NOTE — Progress Notes (Signed)
NAME:  Daniel Chapman, MRN:  [id number], DOB:  January 17, 1932, LOS: 1 ADMISSION DATE:  10/05/2021 CONSULTATION DATE:  10/04/2021 REFERRING MD:  Doren Custard - EDP CHIEF COMPLAINT:  Unresponsive, c/f sepsis, pneumonia  History of Present Illness:  88 year old man who presented [facility] ED 5/30 via EMS after being found down at home by his wife. GCS 3 on EMS arrival and agonal breathing, but had a pulse. "Warm to touch" per EMS. PMHx significant for Afib (on Eliquis), PAD, COPD, tobacco abuse, laryngeal CA (s/p radiation), chronic aspiration, AAA, cerebellar stroke, vascular dementia.  Per patient's wife and daughter at bedside, patient generally weak and slept most of the day 5/27.  On 5/28, patient reportedly fell at home and hit his head (of note, on Eliquis).  On 5/29, patient seemed "out of it" most of the day and was found unresponsive in bed this morning 5/30.  Family notes history of chronic aspiration after treatment for laryngeal cancer; he had a poor appetite compared to usual but was able to drink some over the weekend.  On ED arrival, temp 97.1, tachycardic to 110s, tachypneic to 27, SBP 140s. SpO2 88% with BVM. GCS 3. Intubated on ED arrival. CXR with patchy RUL/RLL opacities. CT Head with NAICA, chronic cerebellar infarct. CT C-Spine negative for fracture. CT Chest/A/P demonstrating bilateral multilobar airspace disease (R>L), likely r/t aspiration; no acute abdominal/pelvic findings. Labs notable for ABG 7.120/58/436/19, WBC 13.6, stable H&H. LA > 9.0. UA with > 500 glucose, 100 protein, otherwise unremarkable.  Pertinent Medical History:  Afib (on Eliquis), PAD, COPD, tobacco abuse, laryngeal CA (s/p radiation), chronic aspiration, AAA, cerebellar stroke, dementia  Significant Hospital Events: Including procedures, antibiotic start and stop dates in addition to other pertinent events   5/30 - Presented to [facility] ED via EMS after being found unresponsive in bed by wife. GCS 3. Maintained pulse  throughout, required BVM ventilation. Intubated on arrival to ED. Broad spectrum antibiotics initiated. CT Head/Cspine/A/P without acute findings. CT Chest with bilateral multilobar pneumonia, R > L. Admitted to ICU.  Interim History / Subjective:  More responsive this morning.   Objective:  Blood pressure 109/74, pulse 87, temperature 97.9 F (36.6 C), resp. rate (!) 28, height '5\' 7"'$  (1.702 m), weight 57.2 kg, SpO2 100 %. CVP:  [17 mmHg-18 mmHg] 18 mmHg  Vent Mode: PRVC FiO2 (%):  [60 %-100 %] 100 % Set Rate:  [26 bmp-30 bmp] 28 bmp Vt Set:  [56 mL-560 mL] 520 mL PEEP:  [5 cmH20-10 cmH20] 10 cmH20 Plateau Pressure:  [20 cmH20-22 cmH20] 22 cmH20   Intake/Output Summary (Last 24 hours) at 10/27/2021 0921 Last data filed at 10/27/2021 0651 Gross per 24 hour  Intake 5210 ml  Output 300 ml  Net 4910 ml   Filed Weights   10/09/2021 1224 10/15/2021 1251 10/27/21 0630  Weight: 51 kg 58 kg 57.2 kg   Physical Examination: General: critically ill appearing man lying in bed intubated, lightly sedated HEENT: [place]/AT, eyes anicteric, ETT & OGT Neuro: RASS -3, follows some commands very weakly. CV: mild tachycardia, irreg rhythm PULM: Less air trapping, still no wheezing.  GI: thin, soft, NT Extremities: no peripheral edema, no cyanosis Skin: warm, dry, pallor  K+ 3.2 Bicarb 26 BUN 30 Cr 1.3 AST42 ALT 24 T bili 1.5 WBC 7.5 H/H 10.9/33.1 Platelets 25 CXR personally reviewed> R pneumonia, CVC, ETT CTA reviewed> no PE, R pneumonia, severe emphysema, possible LUL nodule with cavitation  Resolved Hospital Problem List:    Assessment & Plan:  Septic shock likely secondary to multilobar pneumonia Lactic acidosis -Deescalate antibiotics to ceftriaxone and azithromycin -Follow cultures -NE to maintain MAP>65 -Legionella pending  Acute hypoxemic and hypercarbic respiratory failure secondary to multilobar pneumonia Aspiration pneumonia suspected COPD with acute exacerbation, present on  admission -LTVV -VAP prevention protocol -PAD protocol for sedation -con't steroids -change BD to yupelri, brovana + duonebs PRN -daily SAT & SBT as tolerated; O2 requirements too high for SBT currently -recommend tobacco cessation; family indicated he will not likely quit  LUL cavitation, multiple lung nodules - would need OP follow up CT in 1-2 months  Chronic Atrial fibrillation Elevated troponin, suspect demand ischemia Acute RV failure; no PE. Suspect this is related to acute respiratory failure. Longstanding Afib, on Eliquis. CHA2DS2-VASc score 5. Mildly elevated troponin on admission. -con't to hold Eliquis with low platelets -can resume AC with heparin as platelets improve -supportive care with management of respiratory failure -not needing rate control meds currently; on pressors  AKI secondary to sepsis, back to recent baseline Cr ~1.3. -strict I/O -renally dose meds, avoid nephrotoxic meds -monitor -trial of foley removal today  History of laryngeal CA, s/p radiation with chronic aspiration Severe protein energy malnutrition Physical deconditioning Vascular dementia - Will need aggressive rehabilitation as he recovers; family knows his prognosis is guarded  Daughter and son in law updated at bedside. Appreciate Palliative Care's involvement.  Best Practice: (right click and "Reselect all SmartList Selections" daily)   Diet/type: tubefeeds DVT prophylaxis: SCDs GI prophylaxis: PPI Lines: Central line and yes and it is still needed Foley:  Yes, and it is no longer needed and removal ordered  Code Status:  DNR Last date of multidisciplinary goals of care discussion [5/31]  Labs:  CBC: Recent Labs  Lab 09/28/2021 1044 10/06/2021 1133 10/05/2021 1355 10/25/2021 2016 10/27/21 0512  WBC 13.6*  --   --   --  7.5  NEUTROABS 11.3*  --   --   --   --   HGB 13.8 12.2* 13.3 14.6 10.9*  HCT 42.5 36.0* 39.0 43.0 33.1*  MCV 87.3  --   --   --  86.6  PLT 41*  --   --   --   25*    Basic Metabolic Panel: Recent Labs  Lab 10/09/2021 1044 09/27/2021 1133 09/29/2021 1355 10/15/2021 1554 10/16/2021 2016 10/27/21 0512  NA 132* 136 135 136 137 138  K 3.6 3.0* 3.1* 3.2* 3.1* 3.2*  CL 96*  --   --  102  --  100  CO2 17*  --   --  22  --  26  GLUCOSE 319*  --   --  166*  --  175*  BUN 37*  --   --  31*  --  30*  CREATININE 1.62*  --   --  1.29*  --  1.34*  CALCIUM 9.2  --   --  8.2*  --  7.8*  MG  --   --   --   --   --  1.3*  PHOS  --   --   --   --   --  3.6    GFR: Estimated Creatinine Clearance: 30.8 mL/min (A) (by C-G formula based on SCr of 1.34 mg/dL (H)).   This patient is critically ill with multiple organ system failure which requires frequent high complexity decision making, assessment, support, evaluation, and titration of therapies. This was completed through the application of advanced monitoring technologies and extensive interpretation of multiple databases.  During this encounter critical care time was devoted to patient care services described in this note for 40 minutes.  [name], DO 10/27/21 12:38 PM [place] [facility]

## 2021-10-27 NOTE — Consult Note (Addendum)
Palliative Medicine Inpatient Consult Note  Consulting Provider: [name], DO  Reason for consult:   [place] [facility] Consult  Reason for Consult? chronic debility, has home-based palliative care, intubated in septic shock   10/27/2021  HPI:  Per intake H&P --> 88 year old man who presented to [facility] ED 5/30 via EMS after being found down at home by his wife. GCS 3 on EMS arrival and agonal breathing, but had a pulse. "Warm to touch" per EMS. PMHx significant for Afib (on Eliquis), PAD, COPD, tobacco abuse, laryngeal CA (s/p radiation), chronic aspiration, AAA, cerebellar stroke, vascular dementia. Admitted in septic chock thought to be in the setting of MF PNA. Palliative care has been asked to get involved to further address goals of care.  Clinical Assessment/Goals of Care:  *Please note that this is a verbal dictation therefore any spelling or grammatical errors are due to the "[place]" system interpretation.  I have reviewed medical records including EPIC notes, labs and imaging, received report from bedside RN, assessed the patient.    I met with [name] and her daughter to further discuss diagnosis prognosis, GOC, EOL wishes, disposition and options.   I introduced Palliative Medicine as specialized medical care for people living with serious illness. It focuses on providing relief from the symptoms and stress of a serious illness. The goal is to improve quality of life for both the patient and the family.  Medical History Review and Understanding:  I have reviewed with [name] that her husband has a past medical history significant for laryngeal cancer, COPD, peripheral arterial disease, atrial fibrillation, a cerebellar stroke in 2018, and vascular dementia.  Social History:  Orian is from [place], [place].  He and his wife have been together since 1954.  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  Hubbard has 6 children, 10 grandchildren, and 3 great-grandchildren.  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  Sami used to love college football and basketball.  He is a man of faith and practices within the [employer] [facility] denomination.  Functional and Nutritional State:  Prior to hospitalization Jeray lived with his wife in a single-family home.  His daughter Jenny Reichmann would often check in on him.  Xzaiver has endured a 13 pound weight loss in the last month and as far as mobility for the most part sits in his reclining chair all day.  His wife shares that he no longer has interest in watching college sports and tends to just total his thumbs.  Advance Directives:  A detailed discussion was had today regarding advanced directives.  Patient's wife [name] is his Air traffic controller.  He does have advanced directives and had enlisted allowing for natural death  Code Status:  At this time patient is a DO NOT RESUSCITATE CODE STATUS per conversation with Dr. Carlis Abbott yesterday.  I did confirm this with [name].  Discussion:  We reviewed that Jessejames is suffering from multifocal pneumonia and sepsis which is the reason for life supportive measures such as intubation and pressor support in the intensive care unit.  We discussed the hope for the best though prepare for the worst in any situations such as this.  I shared that it is possible Geremy will not improve and we will need to set limits on the delivery of medical care as it relates to [employer].  His wife and daughter share they would never want him to be on life supportive measures indefinitely.  I did recommend setting a time limit of 4 to 7 days and if the critical care team was unable to wean him off of these measures to consider shifting focus to more of a comfort oriented path.  Patient's wife is very  emotional during our conversation together.  She expresses that the Tanav Orsak she knew left her when he suffered a stroke in 2018.  She shares that from her perspective he has little quality in his life.  Though, she also expresses that she wants to continue to allow additional time to see if he can improve.  I expressed my own concerns in the setting of Pyon rapid weight loss in the last month and declining functional status.  I shared that those are poor indicators for long-term whether he gets through this event or not.  We reviewed that if at any point [place] clinical situation is looking poorly it would be appropriate to have a family meeting with patient's spouse and all 6 children for further delineation of care.  I did share that Gen is presently on supportive measures which will not allow him to have a natural death and if that is desired then this may need to be further considered moving forward.  Expressed that the palliative care team will remain involved to offer additional support and guidance for Latrell and his family in the oncoming days.  Empathetic listening provided to patient's wife given the magnitude of the situation.  Discussed the importance of continued conversation with family and their  medical providers regarding overall plan of care and treatment options, ensuring decisions are within the context of the patients values and GOCs.  Decision Maker: [name] (wife) [phone]  SUMMARY OF RECOMMENDATIONS   DNAR   Reviewed best case worst-case scenarios  Continue with present time limited efforts (4-7 days) to improve [place] health state --> Family clear that they would not want Herbie [place] on life sustaining measures long term and will accept when it's his time to pass  Ongoing palliative care support  Code Status/Advance Care Planning: DNAR   Palliative Prophylaxis:  Aspiration, Bowel Regimen, Delirium Protocol, Frequent Pain Assessment, Oral Care,  Palliative Wound Care, and Turn Reposition  Additional Recommendations (Limitations, Scope, Preferences): Continue to treat what is treated, do not escalate care  Psycho-social/Spiritual:  Desire for further Chaplaincy support: Patient is [facility] Additional Recommendations: Education on acute critical illness-discussion of likelihood for decompensation in the context of decision making   Prognosis: Tenuous at this time given that patient is on multiple measures of life support  Discharge Planning: Unclear  Vitals:   10/27/21 0630 10/27/21 0645  BP: 102/74 109/72  Pulse: 93 96  Resp: (!) 28 (!) 28  Temp: 98.1 F (36.7 C) 98.2 F (36.8 C)  SpO2: 100% 99%    Intake/Output Summary (Last 24 hours) at 10/27/2021 0653 Last data filed at 10/27/2021 3500 Gross per 24 hour  Intake 5210 ml  Output 300 ml  Net 4910 ml   Last Weight  Most recent update: 10/27/2021  6:50 AM    Weight  57.2 kg (126 lb 1.7 oz)            Gen: Elderly Caucasian male HEENT: ETT, OGT, dry mucous membranes CV: Regular rate and irregular rhythm PULM: On mechanical ventilator ABD: soft/nontender EXT: No edema Neuro: Opens eyes does not follow direction  PPS: 10%   This conversation/these  recommendations were discussed with patient primary care team, Dr. Loletta Specter  Billing based on MDM: High  Problems Addressed: One acute or chronic illness or injury that poses a threat to life or bodily function  Amount and/or Complexity of Data: Category 3:Discussion of management or test interpretation with external physician/other qualified health care professional/appropriate source (not separately reported)  Risks: Decision not to resuscitate or to de-escalate care because of poor prognosis ______________________________________________________ [place] Team Team Cell Phone: [phone] Please utilize secure chat with additional questions, if there is no response  within 30 minutes please call the above phone number  Palliative Medicine Team providers are available by phone from 7am to 7pm daily and can be reached through the team cell phone.  Should this patient require assistance outside of these hours, please call the patient's attending physician.

## 2021-10-28 DIAGNOSIS — E43 Unspecified severe protein-calorie malnutrition: Secondary | ICD-10-CM

## 2021-10-28 DIAGNOSIS — J9602 Acute respiratory failure with hypercapnia: Secondary | ICD-10-CM

## 2021-10-28 DIAGNOSIS — J189 Pneumonia, unspecified organism: Secondary | ICD-10-CM | POA: Diagnosis not present

## 2021-10-28 DIAGNOSIS — Z7189 Other specified counseling: Secondary | ICD-10-CM | POA: Diagnosis not present

## 2021-10-28 DIAGNOSIS — Z515 Encounter for palliative care: Secondary | ICD-10-CM | POA: Diagnosis not present

## 2021-10-28 DIAGNOSIS — J9601 Acute respiratory failure with hypoxia: Secondary | ICD-10-CM | POA: Diagnosis not present

## 2021-10-28 DIAGNOSIS — A419 Sepsis, unspecified organism: Secondary | ICD-10-CM | POA: Diagnosis not present

## 2021-10-28 LAB — COMPREHENSIVE METABOLIC PANEL
ALT: 25 U/L (ref 0–44)
AST: 37 U/L (ref 15–41)
Albumin: 2.1 g/dL — ABNORMAL LOW (ref 3.5–5.0)
Alkaline Phosphatase: 45 U/L (ref 38–126)
Anion gap: 3 — ABNORMAL LOW (ref 5–15)
BUN: 44 mg/dL — ABNORMAL HIGH (ref 8–23)
CO2: 30 mmol/L (ref 22–32)
Calcium: 8.2 mg/dL — ABNORMAL LOW (ref 8.9–10.3)
Chloride: 104 mmol/L (ref 98–111)
Creatinine, Ser: 1.04 mg/dL (ref 0.61–1.24)
GFR, Estimated: >60 mL/min (ref >60)
Glucose, Bld: 160 mg/dL — ABNORMAL HIGH (ref 70–99)
Potassium: 5.4 mmol/L — ABNORMAL HIGH (ref 3.5–5.1)
Sodium: 137 mmol/L (ref 135–145)
Total Bilirubin: 0.6 mg/dL (ref 0.3–1.2)
Total Protein: 4.8 g/dL — ABNORMAL LOW (ref 6.5–8.1)

## 2021-10-28 LAB — CBC WITH DIFFERENTIAL/PLATELET
Abs Immature Granulocytes: 0.56 10*3/uL — ABNORMAL HIGH (ref 0.00–0.07)
Basophils Absolute: 0.1 10*3/uL (ref 0.0–0.1)
Basophils Relative: 0 %
Eosinophils Absolute: 0 10*3/uL (ref 0.0–0.5)
Eosinophils Relative: 0 %
HCT: 32.3 % — ABNORMAL LOW (ref 39.0–52.0)
Hemoglobin: 10.6 g/dL — ABNORMAL LOW (ref 13.0–17.0)
Immature Granulocytes: 4 %
Lymphocytes Relative: 3 %
Lymphs Abs: 0.5 10*3/uL — ABNORMAL LOW (ref 0.7–4.0)
MCH: 28 pg (ref 26.0–34.0)
MCHC: 32.8 g/dL (ref 30.0–36.0)
MCV: 85.2 fL (ref 80.0–100.0)
Monocytes Absolute: 1 10*3/uL (ref 0.1–1.0)
Monocytes Relative: 6 %
Neutro Abs: 13.9 10*3/uL — ABNORMAL HIGH (ref 1.7–7.7)
Neutrophils Relative %: 87 %
Platelets: 45 10*3/uL — ABNORMAL LOW (ref 150–400)
RBC: 3.79 MIL/uL — ABNORMAL LOW (ref 4.22–5.81)
RDW: 19.1 % — ABNORMAL HIGH (ref 11.5–15.5)
WBC: 16.1 10*3/uL — ABNORMAL HIGH (ref 4.0–10.5)
nRBC: 1.2 % — ABNORMAL HIGH (ref 0.0–0.2)

## 2021-10-28 LAB — BASIC METABOLIC PANEL
Anion gap: 5 (ref 5–15)
BUN: 44 mg/dL — ABNORMAL HIGH (ref 8–23)
CO2: 30 mmol/L (ref 22–32)
Calcium: 8.3 mg/dL — ABNORMAL LOW (ref 8.9–10.3)
Chloride: 104 mmol/L (ref 98–111)
Creatinine, Ser: 0.97 mg/dL (ref 0.61–1.24)
GFR, Estimated: >60 mL/min (ref >60)
Glucose, Bld: 148 mg/dL — ABNORMAL HIGH (ref 70–99)
Potassium: 5.1 mmol/L (ref 3.5–5.1)
Sodium: 139 mmol/L (ref 135–145)

## 2021-10-28 LAB — GLUCOSE, CAPILLARY
Glucose-Capillary: 178 mg/dL — ABNORMAL HIGH (ref 70–99)
Glucose-Capillary: 143 mg/dL — ABNORMAL HIGH (ref 70–99)
Glucose-Capillary: 146 mg/dL — ABNORMAL HIGH (ref 70–99)

## 2021-10-28 LAB — CULTURE, RESPIRATORY W GRAM STAIN: Culture: NORMAL

## 2021-10-28 LAB — LEGIONELLA PNEUMOPHILA SEROGP 1 UR AG: L. pneumophila Serogp 1 Ur Ag: NEGATIVE

## 2021-10-28 LAB — MAGNESIUM: Magnesium: 2.9 mg/dL — ABNORMAL HIGH (ref 1.7–2.4)

## 2021-10-28 LAB — PHOSPHORUS: Phosphorus: 2.9 mg/dL (ref 2.5–4.6)

## 2021-10-28 MED ORDER — ACETAMINOPHEN 650 MG RE SUPP
650.0000 mg | Freq: Four times a day (QID) | RECTAL | Status: DC | PRN
Start: 2021-10-28 — End: 2021-10-29

## 2021-10-28 MED ORDER — GLYCOPYRROLATE 0.2 MG/ML IJ SOLN: 0.2000 mg | INTRAMUSCULAR | Status: DC | PRN

## 2021-10-28 MED ORDER — SODIUM CHLORIDE 0.9 % IV BOLUS
500.0000 mL | Freq: Once | INTRAVENOUS | Status: AC
  Administered 2021-10-28: 500 mL via INTRAVENOUS

## 2021-10-28 MED ORDER — ONDANSETRON HCL 4 MG/2ML IJ SOLN: 4.0000 mg | Freq: Four times a day (QID) | INTRAMUSCULAR | Status: DC | PRN

## 2021-10-28 MED ORDER — FUROSEMIDE 10 MG/ML IJ SOLN
40.0000 mg | Freq: Once | INTRAMUSCULAR | Status: AC
  Administered 2021-10-28: 40 mg via INTRAVENOUS
  Filled 2021-10-28: qty 4

## 2021-10-28 MED ORDER — ALBUMIN HUMAN 25 % IV SOLN
12.5000 g | Freq: Once | INTRAVENOUS | Status: AC
Start: 2021-10-28 — End: 2021-10-28
  Administered 2021-10-28: 12.5 g via INTRAVENOUS
  Filled 2021-10-28: qty 50

## 2021-10-28 MED ORDER — MIDAZOLAM HCL 2 MG/2ML IJ SOLN
2.0000 mg | INTRAMUSCULAR | Status: DC | PRN
  Administered 2021-10-28: 2 mg via INTRAVENOUS
  Filled 2021-10-28: qty 2

## 2021-10-28 MED ORDER — ACETAMINOPHEN 325 MG PO TABS: 650.0000 mg | ORAL_TABLET | Freq: Four times a day (QID) | ORAL | Status: DC | PRN

## 2021-10-28 MED ORDER — SODIUM CHLORIDE 0.9% FLUSH
10.0000 mL | Freq: Two times a day (BID) | INTRAVENOUS | Status: DC
  Administered 2021-10-28 – 2021-10-29 (×2): 10 mL

## 2021-10-28 MED ORDER — SODIUM CHLORIDE 0.9% FLUSH: 10.0000 mL | INTRAVENOUS | Status: DC | PRN

## 2021-10-28 MED ORDER — POLYVINYL ALCOHOL 1.4 % OP SOLN: 1.0000 [drp] | Freq: Four times a day (QID) | OPHTHALMIC | Status: DC | PRN

## 2021-10-28 MED ORDER — FENTANYL BOLUS VIA INFUSION: 100.0000 ug | INTRAVENOUS | Status: DC | PRN

## 2021-10-28 MED ORDER — SODIUM ZIRCONIUM CYCLOSILICATE 10 G PO PACK
10.0000 g | PACK | Freq: Once | ORAL | Status: AC
  Administered 2021-10-28: 10 g
  Filled 2021-10-28: qty 1

## 2021-10-28 MED ORDER — FENTANYL CITRATE (PF) 100 MCG/2ML IJ SOLN: 50.0000 ug | INTRAMUSCULAR | Status: DC | PRN

## 2021-10-28 MED ORDER — GLYCOPYRROLATE 1 MG PO TABS: 1.0000 mg | ORAL_TABLET | ORAL | Status: DC | PRN

## 2021-10-28 MED ORDER — ONDANSETRON 4 MG PO TBDP: 4.0000 mg | ORAL_TABLET | Freq: Four times a day (QID) | ORAL | Status: DC | PRN

## 2021-10-28 MED ORDER — FENTANYL 2500MCG IN NS 250ML (10MCG/ML) PREMIX INFUSION
0.0000 ug/h | INTRAVENOUS | Status: DC
  Administered 2021-10-28: 250 ug/h via INTRAVENOUS
  Administered 2021-10-29: 200 ug/h via INTRAVENOUS
  Filled 2021-10-28 (×2): qty 250

## 2021-10-28 MED ORDER — HALOPERIDOL LACTATE 5 MG/ML IJ SOLN: 2.5000 mg | INTRAMUSCULAR | Status: DC | PRN

## 2021-10-28 MED ORDER — DIPHENHYDRAMINE HCL 50 MG/ML IJ SOLN
25.0000 mg | INTRAMUSCULAR | Status: DC | PRN
Start: 2021-10-28 — End: 2021-10-29

## 2021-10-28 NOTE — Progress Notes (Addendum)
eLink Physician-Brief Progress Note Patient Name: Daniel Chapman DOB: [birth year] MRN: [id number]   Date of Service  10/28/2021  HPI/Events of Note  Multiple issues: 1. Hyperkalemia - K+ = 5.4 and Creatinine = 1.4. 2. Oliguria - Bladder scan with 0 mL of urine. LVEF = 60-65%.   eICU Interventions  Plan: Lokelma 10 gm per tube now. Bolus with 0.9 NaCl 500 mL IV over 30 minutes now. Monitor CVP now and Q 4 hours.   Repeat BMP at 10 AM.     Intervention Category Major Interventions: Other:;Electrolyte abnormality - evaluation and management  Avishai Reihl Cornelia Copa 10/28/2021, 3:32 AM

## 2021-10-28 NOTE — Progress Notes (Signed)
Palliative: Chart review completed. Daniel Chapman is lying quietly in bed.  He appears acutely/chronically ill and quite frail.  He is intubated/ventilated.  He will briefly make eye contact.  There is no family at bedside at this time.  Bedside nursing staff is present attending to needs.  Conference with attending and bedside nursing staff related to patient condition, needs, goals of care.  Plan: At this point continue to treat the treatable but no escalation of care/CPR.  Family has agreed to a limited time trial of intubation.  Time for time for outcomes.  PMT to follow-up with family 6/2.  25 minutes [name], NP Palliative medicine team Team phone [phone] Greater than 50% of this time was spent counseling and coordinating care related to the above assessment and plan.

## 2021-10-28 NOTE — Progress Notes (Signed)
[place] Progress Note Patient Name: Finlee Milo DOB: [birth year] MRN: [id number]   Date of Service  10/28/2021  HPI/Events of Note  Oliguria - CVP = 12 and Albumin = 2.1.   eICU Interventions  Plan: 25% Albumin 12.5 gm IV X 1.     Intervention Category Major Interventions: Other:  Paulino Cork Cornelia Copa 10/28/2021, 5:50 AM

## 2021-10-28 NOTE — Progress Notes (Signed)
NAME:  Daniel Chapman, MRN:  [id number], DOB:  May 25, 1932, LOS: 2 ADMISSION DATE:  10/24/2021 CONSULTATION DATE:  10/23/2021 REFERRING MD:  Doren Custard - EDP CHIEF COMPLAINT:  Unresponsive, c/f sepsis, pneumonia  History of Present Illness:  88 year old man who presented to [facility] ED 5/30 via EMS after being found down at home by his wife. GCS 3 on EMS arrival and agonal breathing, but had a pulse. "Warm to touch" per EMS. PMHx significant for Afib (on Eliquis), PAD, COPD, tobacco abuse, laryngeal CA (s/p radiation), chronic aspiration, AAA, cerebellar stroke, vascular dementia.  Per patient's wife and daughter at bedside, patient generally weak and slept most of the day 5/27.  On 5/28, patient reportedly fell at home and hit his head (of note, on Eliquis).  On 5/29, patient seemed "out of it" most of the day and was found unresponsive in bed this morning 5/30.  Family notes history of chronic aspiration after treatment for laryngeal cancer; he had a poor appetite compared to usual but was able to drink some over the weekend.  On ED arrival, temp 97.1, tachycardic to 110s, tachypneic to 27, SBP 140s. SpO2 88% with BVM. GCS 3. Intubated on ED arrival. CXR with patchy RUL/RLL opacities. CT Head with NAICA, chronic cerebellar infarct. CT C-Spine negative for fracture. CT Chest/A/P demonstrating bilateral multilobar airspace disease (R>L), likely r/t aspiration; no acute abdominal/pelvic findings. Labs notable for ABG 7.120/58/436/19, WBC 13.6, stable H&H. LA > 9.0. UA with > 500 glucose, 100 protein, otherwise unremarkable.  Pertinent Medical History:  Afib (on Eliquis), PAD, COPD, tobacco abuse, laryngeal CA (s/p radiation), chronic aspiration, AAA, cerebellar stroke, dementia  Significant Hospital Events: Including procedures, antibiotic start and stop dates in addition to other pertinent events   5/30 - Presented to [facility] ED via EMS after being found unresponsive in bed by wife. GCS 3. Maintained pulse  throughout, required BVM ventilation. Intubated on arrival to ED. Broad spectrum antibiotics initiated. CT Head/Cspine/A/P without acute findings. CT Chest with bilateral multilobar pneumonia, R > L. Admitted to ICU.  Interim History / Subjective:  Not answering questions this morning.  Objective:  Blood pressure 105/67, pulse (!) 114, temperature 97.8 F (36.6 C), temperature source Axillary, resp. rate (!) 28, height '5\' 7"'$  (1.702 m), weight 57.2 kg, SpO2 100 %. CVP:  [7 mmHg-17 mmHg] (P) 13 mmHg  Vent Mode: PRVC FiO2 (%):  [50 %-80 %] (P) 50 % Set Rate:  [28 bmp] 28 bmp Vt Set:  [520 mL] 520 mL PEEP:  [10 cmH20] 10 cmH20 Plateau Pressure:  [20 cmH20-23 cmH20] 20 cmH20   Intake/Output Summary (Last 24 hours) at 10/28/2021 0844 Last data filed at 10/28/2021 0500 Gross per 24 hour  Intake 2226.05 ml  Output 230 ml  Net 1996.05 ml    Filed Weights   10/10/2021 1224 10/21/2021 1251 10/27/21 0630  Weight: 51 kg 58 kg 57.2 kg   Physical Examination: General: critically ill appearing man lying in bed in NAD, intubated, sedated HEENT: [place]/AT, eyes anicteric Neuro: RASS -2, globally weak, not following commands. Weak cough. CV: mildly tachycardic, irreg rhythm PULM: minimal ongoing air trapping, no wheezing, rhonchi on R. Thick brown secretions from ETT. GI: thin, soft, NT Extremities: no peripheral edema, no cyanosis Skin: pallor, warm, dry. Rash persists on left neck unchanged-- scaly with mild erythema.  K+ 5.4 Bicarb 30 BUN 44 Cr 1.04 T bili 0.6 WBC 16.1 H/H 10.6/32.3 Platelets 45 Resp culture> GPC Blood cultures> NGTD  Resolved Hospital Problem List:  Assessment & Plan:  Septic shock likely secondary to multilobar pneumonia Lactic acidosis -Con't ceftriaxone and azithromycin. Legionella still pending. -Follow cultures -NE to maintain MAP >65, weaning off  Acute hypoxemic and hypercarbic respiratory failure secondary to multilobar pneumonia Aspiration pneumonia  suspected COPD with acute exacerbation, present on admission -LTVV -wean FiO2 and PEEP to maintain SpO2 >90% -VAP prevention protocol -PAD protocol for sedation -con't steroids -yupelri and brovana -daily SAT & SBT as tolerated; need to wean PEEP more but then can start SBTs -I have recommend tobacco cessation; family indicated he will not likely quit. -lasix to help with hypervolemia  Acute metabolic encephalopathy due to sepsis Vascular dementia -supportive care -PAD protocol  LUL cavitation, multiple lung nodules - needs OP follow up CT in 1-2 months  Chronic Atrial fibrillation Elevated troponin, suspect demand ischemia Acute RV failure; no PE. Suspect this is related to acute respiratory failure. Longstanding Afib, on Eliquis. CHA2DS2-VASc score 5. Mildly elevated troponin on admission. -con't holding Eliquis with low platelets; would favor resuming heparin when ready to resume AC -con't supportive care -not needing rate control meds currently despite norepinephrine  AKI secondary to sepsis, back to recent baseline Cr ~1.3. Oliguria treated with IVF overnight. -strict I/O -renally dose meds, avoid nephrotoxic meds -monitor -foley removed 5/31  Hyperkalemia, iatrogenic from repletion -previously received lokelma -lasix today will help -monitor  Hyperbilirubinemia and elevated transaminases due to sepsis, resolved -No additional monitoring required  History of laryngeal CA, s/p radiation with chronic aspiration Severe protein energy malnutrition Physical deconditioning Vascular dementia - Will require aggressive rehabilitation and has likely very limited recovery potential due to severe baseline debility and malnutrition. Family is aware of his guarded prognosis.   Best Practice: (right click and "Reselect all SmartList Selections" daily)   Diet/type: tubefeeds DVT prophylaxis: SCDs GI prophylaxis: PPI Lines: Central line and yes and it is still needed Foley:   N/A Code Status:  DNR Last date of multidisciplinary goals of care discussion [5/31]  Labs:  CBC: Recent Labs  Lab 10/13/2021 1044 09/29/2021 1133 10/21/2021 1355 10/24/2021 2016 10/27/21 0512 10/28/21 0217  WBC 13.6*  --   --   --  7.5 16.1*  NEUTROABS 11.3*  --   --   --   --  13.9*  HGB 13.8 12.2* 13.3 14.6 10.9* 10.6*  HCT 42.5 36.0* 39.0 43.0 33.1* 32.3*  MCV 87.3  --   --   --  86.6 85.2  PLT 41*  --   --   --  25* 45*    Basic Metabolic Panel: Recent Labs  Lab 10/16/2021 1044 10/25/2021 1133 10/12/2021 1355 10/20/2021 1554 10/19/2021 2016 10/27/21 0512 10/27/21 2052 10/28/21 0217  NA 132*   < > 135 136 137 138  --  137  K 3.6   < > 3.1* 3.2* 3.1* 3.2*  --  5.4*  CL 96*  --   --  102  --  100  --  104  CO2 17*  --   --  22  --  26  --  30  GLUCOSE 319*  --   --  166*  --  175*  --  160*  BUN 37*  --   --  31*  --  30*  --  44*  CREATININE 1.62*  --   --  1.29*  --  1.34*  --  1.04  CALCIUM 9.2  --   --  8.2*  --  7.8*  --  8.2*  MG  --   --   --   --   --  1.3* 2.8* 2.9*  PHOS  --   --   --   --   --  3.6  --  2.9   < > = values in this interval not displayed.    GFR: Estimated Creatinine Clearance: 39.7 mL/min (by C-G formula based on SCr of 1.04 mg/dL).   This patient is critically ill with multiple organ system failure which requires frequent high complexity decision making, assessment, support, evaluation, and titration of therapies. This was completed through the application of advanced monitoring technologies and extensive interpretation of multiple databases. During this encounter critical care time was devoted to patient care services described in this note for 40 minutes.  [name], DO 10/28/21 8:58 AM [facility]

## 2021-10-28 NOTE — IPAL (Signed)
  Interdisciplinary Goals of Care Family Meeting   Date carried out:: 10/28/2021  Location of the meeting: Bedside  Member's involved: Physician, Bedside Registered Nurse, and Family Member or next of kin  Durable Power of Attorney or acting medical decision maker: wife    Discussion: We discussed goals of care for [name] .  I met with his wife, 1 son, and 2 daughters today. We discussed that he has made small improvements but that he still has a long way to go in his recovery. With his poor functional and nutritional status prior to admission, he has a high risk of incomplete recovery and a poor performance status after this. His wife is concerned that he will never recover to an acceptable quality of life. We discussed the possibility of changing the goals of our care to focus more on comfort and less on life prolonging therapies, including potentially a terminal extubation. All 6 children have been here to visit and there are 2 grandchildren coming this afternoon. We will liberalize his visitation now to enable family to spend more time with him. At this point family is planning on terminal extubation, but a time has not yet been set.  Code status: Full DNR  Disposition: In-patient comfort care; no terminal extubation time set   Time spent for the meeting: 45 min.  Daniel Chapman 10/28/2021, 1:17 PM

## 2021-10-28 DEATH — deceased

## 2021-10-29 DIAGNOSIS — Z515 Encounter for palliative care: Secondary | ICD-10-CM | POA: Diagnosis not present

## 2021-10-29 DIAGNOSIS — J9601 Acute respiratory failure with hypoxia: Secondary | ICD-10-CM | POA: Diagnosis not present

## 2021-10-29 DIAGNOSIS — A419 Sepsis, unspecified organism: Secondary | ICD-10-CM | POA: Diagnosis not present

## 2021-10-29 DIAGNOSIS — R6521 Severe sepsis with septic shock: Secondary | ICD-10-CM | POA: Diagnosis not present

## 2021-10-29 DIAGNOSIS — J189 Pneumonia, unspecified organism: Secondary | ICD-10-CM | POA: Diagnosis not present

## 2021-10-29 MED ORDER — MORPHINE 100MG IN NS 100ML (1MG/ML) PREMIX INFUSION
2.0000 mg/h | INTRAVENOUS | Status: DC
  Administered 2021-10-29: 12.5 mg/h via INTRAVENOUS
  Filled 2021-10-29: qty 100

## 2021-10-29 MED ORDER — HALOPERIDOL LACTATE 2 MG/ML PO CONC: 0.5000 mg | ORAL | Status: DC | PRN

## 2021-10-29 MED ORDER — FUROSEMIDE 10 MG/ML IJ SOLN: 60.0000 mg | Freq: Once | INTRAMUSCULAR | Status: DC

## 2021-10-29 MED ORDER — LORAZEPAM 2 MG/ML IJ SOLN
2.0000 mg | INTRAMUSCULAR | Status: DC | PRN
  Administered 2021-10-29: 2 mg via INTRAVENOUS
  Filled 2021-10-29: qty 1

## 2021-10-29 MED ORDER — HALOPERIDOL 0.5 MG PO TABS: 0.5000 mg | ORAL_TABLET | ORAL | Status: DC | PRN

## 2021-10-29 MED ORDER — BIOTENE DRY MOUTH MT LIQD: 15.0000 mL | OROMUCOSAL | Status: DC | PRN

## 2021-10-29 MED ORDER — ACETAMINOPHEN 325 MG PO TABS: 650.0000 mg | ORAL_TABLET | Freq: Four times a day (QID) | ORAL | Status: DC | PRN

## 2021-10-29 MED ORDER — HALOPERIDOL LACTATE 5 MG/ML IJ SOLN: 0.5000 mg | INTRAMUSCULAR | Status: DC | PRN

## 2021-10-29 MED ORDER — ACETAMINOPHEN 650 MG RE SUPP: 650.0000 mg | Freq: Four times a day (QID) | RECTAL | Status: DC | PRN

## 2021-10-30 LAB — CULTURE, BLOOD (ROUTINE X 2): Special Requests: ADEQUATE

## 2021-10-31 LAB — CULTURE, BLOOD (ROUTINE X 2): Culture: NO GROWTH

## 2021-11-01 ENCOUNTER — Encounter (HOSPITAL_COMMUNITY): Payer: Self-pay

## 2021-11-04 ENCOUNTER — Other Ambulatory Visit: Payer: Self-pay | Admitting: Hospice

## 2021-11-27 NOTE — Discharge Summary (Signed)
DEATH SUMMARY   Patient Details  Name: Daniel Chapman MRN: [id number] DOB: [birth year]  Admission/Discharge Information   Admit Date:  11/09/2021  Date of Death: Date of Death: 12-Nov-2021  Time of Death: Time of Death: 1655/08/25  Length of Stay: 3  Referring Physician: Johna Roles, PA   Reason(s) for Hospitalization  Septic shock due to community acquired pneumonia, strep bacteremia Acute hypoxic hypercapnic respiratory failure Acute exacerbation of COPD Acute metabolic encephalopathy due to sepsis AKI  Diagnoses  Preliminary cause of death:  Secondary Diagnoses (including complications and co-morbidities):  Principal Problem:   Sepsis ([place]) Active Problems:   Protein-calorie malnutrition, severe   Brief Hospital Course (including significant findings, care, treatment, and services provided and events leading to death)  Daniel HEPPLER is a 88 y.o. year old male who was admitted 11/09/2021 after being found in bed unresponsive by pt's wife. He was found to have septic shock due to community acquired pneumonia and strep bacteremia, acute hypoxic hypercapnic respiratory failure requiring intubation, acute encephalopathy. Palliative care was consulted and discussions were held with family regarding his exceedingly low likelihood of a functional recovery. Knowing he would not likely regain his baseline functional status and that the road to get there would be rocky and involve a lot of time away from family, decision made to pursue comfort measures only. He was pronounced dead on November 12, 2021.    Pertinent Labs and Studies  Significant Diagnostic Studies DG Chest Port 1 View  Result Date: 10/27/2021 CLINICAL DATA:  88 year old male with emphysema and pneumonia, intubated. EXAM: PORTABLE CHEST 1 VIEW COMPARISON:  Chest radiographs and CTA chest yesterday. FINDINGS: Portable AP semi upright views at 0438 hours. Endotracheal tube tip in good position between the clavicles and carina. Stable  right IJ central line. Satisfactory enteric tube terminating in the stomach in the left upper quadrant. Underlying large lung volumes with multifocal confluent right lung opacity maximal along the minor fissure. No superimposed pneumothorax or definite pleural effusion. Ventilation is stable from yesterday. Mediastinal contours remain normal. Negative visible bowel gas. IMPRESSION: 1. Stable lines and tubes. 2. Stable ventilation with multilobar right lung pneumonia, underlying emphysema. Electronically Signed   By: Genevie Ann M.D.   On: 10/27/2021 05:22   CT Angio Chest Pulmonary Embolism (PE) W or WO Contrast  Result Date: 11/09/21 CLINICAL DATA:  Pulmonary embolism (PE) suspected, unknown D-dimer EXAM: CT ANGIOGRAPHY CHEST WITH CONTRAST TECHNIQUE: Multidetector CT imaging of the chest was performed using the standard protocol during bolus administration of intravenous contrast. Multiplanar CT image reconstructions and MIPs were obtained to evaluate the vascular anatomy. RADIATION DOSE REDUCTION: This exam was performed according to the departmental dose-optimization program which includes automated exposure control, adjustment of the mA and/or kV according to patient size and/or use of iterative reconstruction technique. CONTRAST:  49m OMNIPAQUE IOHEXOL 350 MG/ML SOLN COMPARISON:  006/13/2023FINDINGS: Cardiovascular: No filling defects in the pulmonary arteries to suggest pulmonary emboli. Heart is normal size. Aorta is normal caliber. And coronary artery calcifications. Mediastinum/Nodes: No mediastinal, hilar, or axillary adenopathy. Trachea and esophagus are unremarkable. Thyroid unremarkable. Endotracheal tube tip in the lower trachea. Lungs/Pleura: Moderate to advanced centrilobular emphysema. Biapical pleuroparenchymal scarring. Consolidation noted posteriorly in the right upper lobe and throughout much of the right lower lobe and to a lesser extent right middle lobe compatible with pneumonia. Minimal  density also seen posteriorly left lower lobe, also likely pneumonia. Nodule in the left upper lobe on image 90 measures 4 mm. 2nd 4  mm nodule in the left upper lobe on image 96, stable 3-4 mm nodules in the left lower lobe on image 98 are stable since 2018. Since 2018. Small Upper Abdomen: No acute findings Musculoskeletal: Chest wall soft tissues are unremarkable. Multilevel moderate to severe compression fractures in the midthoracic spine and lower thoracic spine, progressed since 2018. Review of the MIP images confirms the above findings. IMPRESSION: Consolidation again noted in the right upper lobe, middle lobe and lower lobe and to a lesser extent left lower lobe compatible with pneumonia. No evidence of pulmonary embolus. Coronary artery disease. Aortic Atherosclerosis (ICD10-I70.0) and Emphysema (ICD10-J43.9). Electronically Signed   By: Rolm Baptise M.D.   On: 10/10/2021 22:37   DG CHEST PORT 1 VIEW  Result Date: 10/10/2021 CLINICAL DATA:  [id number].  Encounter for central line placement EXAM: PORTABLE CHEST 1 VIEW COMPARISON:  None Available. FINDINGS: Interval placement of a right internal jugular central venous catheter with tip overlying the expected region of the distal superior vena cava. Endotracheal tube with tip terminating 5.5 cm above the carina. Interval advancement of an enteric tube coursing below the hemidiaphragm with tip and side port overlying the expected region of the gastric lumen. The heart and mediastinal contours are unchanged. Aortic calcification. Biapical, right greater than left, pulmonary scarring. Interval increase in interstitial and airspace opacities of the right lung. No pulmonary edema. Nonspecific blunting of the left costophrenic angle. Trace right pleural effusion. No pneumothorax. No acute osseous abnormality. IMPRESSION: 1. Interval increase in interstitial and airspace opacities of the right lung. 2. Trace right pleural effusion. 3. Interval placement of a right  internal jugular central venous catheter with tip overlying the expected region of the distal superior vena cava. 4. Interval advancement of an enteric tube in appropriate position. 5. Endotracheal tube in appropriate position. 6.  Aortic Atherosclerosis (ICD10-I70.0). Electronically Signed   By: Iven Finn M.D.   On: 09/28/2021 18:21   DG Abd Portable 1V  Result Date: 10/03/2021 CLINICAL DATA:  NG tube placement EXAM: PORTABLE ABDOMEN - 1 VIEW COMPARISON:  09/10/2021 FINDINGS: Tip of NG tube is seen in the stomach. Distal portion of NG tube is coiled within the fundus of the stomach. Bowel gas pattern is nonspecific. Infiltrate is seen in the right lower lung fields suggesting pneumonia. There is right hip arthroplasty. IMPRESSION: Tip of NG tube is seen in the stomach. Infiltrate in the right lower lung fields suggests pneumonia. Electronically Signed   By: Elmer Picker M.D.   On: 10/02/2021 16:39   [place] EKG SITE RITE  Result Date: 10/04/2021 If Site Rite image not attached, placement could not be confirmed due to current cardiac rhythm.  ECHOCARDIOGRAM COMPLETE  Result Date: 10/08/2021    ECHOCARDIOGRAM REPORT   Patient Name:   LENG MONTESDEOCA Date of Exam: 10/23/2021 Medical Rec #:  [id number]    Height:       67.0 in Accession #:    [id number]   Weight:       127.9 lb Date of Birth:  May 06, 1932    BSA:          1.672 m Patient Age:    60 years     BP:           84/64 mmHg Patient Gender: M            HR:           105 bpm. Exam Location:  Inpatient Procedure: 2D Echo, Cardiac Doppler and Color Doppler Indications:  Atrial fibrillation  History:        Patient has no prior history of Echocardiogram examinations.  Sonographer:    Luisa Hart RDCS Referring Phys: [id number] STEPHANIE M REESE  Sonographer Comments: No apical window, Technically challenging study due to limited acoustic windows, echo performed with patient supine and on artificial respirator and suboptimal parasternal window. Image  acquisition challenging due to patient body habitus. IMPRESSIONS  1. Limited examination. No apical windows as pt supine Recomm repeat when patient can turn.  2. Overall LVEF appears normal Cannot fully evaluate regional wall motion due to limited acoustic windows. . Left ventricular ejection fraction, by estimation, is 60 to 65%. The left ventricle has normal function.  3. Right ventricular systolic function is severely reduced. The right ventricular size is moderately enlarged.  4. Left atrial size was mildly dilated.  5. Right atrial size was severely dilated.  6. The mitral valve is normal in structure. Trivial mitral valve regurgitation.  7. TR present At least moderate.  8. AV is thickened, calcidfiew with some restricted motion. COmplete evaluation was not done .Marland Kitchen The aortic valve is tricuspid. FINDINGS  Left Ventricle: Overall LVEF appears normal Cannot fully evaluate regional wall motion due to limited acoustic windows. Left ventricular ejection fraction, by estimation, is 60 to 65%. The left ventricle has normal function. The left ventricular internal cavity size was normal in size. Right Ventricle: The right ventricular size is moderately enlarged. Right vetricular wall thickness was not assessed. Right ventricular systolic function is severely reduced. Left Atrium: Left atrial size was mildly dilated. Right Atrium: Right atrial size was severely dilated. Pericardium: There is no evidence of pericardial effusion. Mitral Valve: The mitral valve is normal in structure. Trivial mitral valve regurgitation. Tricuspid Valve: TR present At least moderate. The tricuspid valve is normal in structure. Aortic Valve: AV is thickened, calcidfiew with some restricted motion. COmplete evaluation was not done. The aortic valve is tricuspid. Pulmonic Valve: The pulmonic valve was grossly normal. Aorta: The aortic root is normal in size and structure. IAS/Shunts: No atrial level shunt detected by color flow Doppler.  LEFT  VENTRICLE PLAX 2D LVOT diam:     2.10 cm LVOT Area:     3.46 cm  LEFT ATRIUM           Index LA diam:      3.10 cm 1.85 cm/m LA Vol (A4C): 58.0 ml 34.69 ml/m   AORTA Ao Root diam: 2.40 cm TRICUSPID VALVE TR Peak grad:   21.5 mmHg TR Vmax:        232.00 cm/s  SHUNTS Systemic Diam: 2.10 cm Dorris Carnes MD Electronically signed by Dorris Carnes MD Signature Date/Time: 10/20/2021/2:22:53 PM    Final    CT CHEST ABDOMEN PELVIS WO CONTRAST  Result Date: 10/11/2021 CLINICAL DATA:  Fall. Chest and abdominal blunt trauma and pain. Hypoxia. EXAM: CT CHEST, ABDOMEN AND PELVIS WITHOUT CONTRAST TECHNIQUE: Multidetector CT imaging of the chest, abdomen and pelvis was performed following the standard protocol without IV contrast. RADIATION DOSE REDUCTION: This exam was performed according to the departmental dose-optimization program which includes automated exposure control, adjustment of the mA and/or kV according to patient size and/or use of iterative reconstruction technique. COMPARISON:  Chest CT on 02/08/2017, and AP CT on 09/10/2021. FINDINGS: CT CHEST FINDINGS Cardiovascular: No evidence of mediastinal hematoma. No pericardial effusion. Aortic and coronary atherosclerotic calcification incidentally noted. Mediastinum/Nodes: No evidence of hemorrhage or pneumomediastinum. No masses or pathologically enlarged lymph nodes identified on this noncontrast  exam. Nasogastric tube is seen with tip in the distal esophagus. Lungs/Pleura: Endotracheal tube is seen in place. Severe centrilobular emphysema again seen. Biapical pleural-parenchymal scarring is again demonstrated. New airspace disease is seen in the right upper, middle, and lower lobes, and to a lesser degree the superior left lower lobe. This is likely due to pneumonia or aspiration, with pulmonary contusion considered less likely. No evidence of pneumothorax or hemothorax. Musculoskeletal: No acute fractures or suspicious bone lesions identified. Chronic appearing  compression fractures of several mid and lower thoracic vertebral bodies are noted. CT ABDOMEN PELVIS FINDINGS Hepatobiliary: No hepatic parenchymal injury or mass identified on this noncontrast exam. Gallbladder is unremarkable. No evidence of biliary ductal dilatation. Pancreas: No parenchymal abnormality identified on this noncontrast exam. Spleen: Prior splenectomy. Adrenal/Urinary Tract: No hemorrhage or parenchymal injury identified on this noncontrast exam. Foley catheter seen within the bladder. Stomach/Bowel: Unopacified bowel loops are unremarkable in appearance. No evidence of hemoperitoneum. Vascular/Lymphatic: No evidence of retroperitoneal hemorrhage. No pathologically enlarged lymph nodes identified. Aortic atherosclerotic calcification incidentally noted. Reproductive: Limited visualization of inferior pelvis due to severe artifact from right hip prosthesis. No mass or other significant abnormality identified. Other:  None. Musculoskeletal: No acute fractures or suspicious bone lesions identified. Chronic appearing compression fractures of the L3 and L5 vertebral bodies are again noted. IMPRESSION: Bilateral multilobar airspace disease, involving right lung greater than left. This is likely due to pneumonia or aspiration, with pulmonary contusion considered less likely. No acute findings within the abdomen or pelvis. Nasogastric tube tip in distal esophagus. Aortic Atherosclerosis (ICD10-I70.0) and Emphysema (ICD10-J43.9). Electronically Signed   By: Marlaine Hind M.D.   On: 09/29/2021 12:30   CT CERVICAL SPINE WO CONTRAST  Result Date: 10/10/2021 CLINICAL DATA:  Neck trauma, intoxicated or obtunded (Age >= 16y) EXAM: CT CERVICAL SPINE WITHOUT CONTRAST TECHNIQUE: Multidetector CT imaging of the cervical spine was performed without intravenous contrast. Multiplanar CT image reconstructions were also generated. RADIATION DOSE REDUCTION: This exam was performed according to the departmental  dose-optimization program which includes automated exposure control, adjustment of the mA and/or kV according to patient size and/or use of iterative reconstruction technique. COMPARISON:  None Available. FINDINGS: Alignment: Trace retrolisthesis at C3-C4. Skull base and vertebrae: No acute cervical spine fracture. Multilevel degenerative endplate irregularity. Chronic T2 and T3 superior endplate compression fractures. Soft tissues and spinal canal: No prevertebral fluid or swelling. No visible canal hematoma. Disc levels: Multilevel degenerative changes are present including disc space narrowing, endplate osteophytes, and facet and uncovertebral hypertrophy. Moderate to marked canal stenosis at C3-C4 and C5-C6. Upper chest: Dictated separately. Other: Endotracheal and enteric tubes are present. Calcified plaque no acute at the ICA origins. IMPRESSION: No acute cervical spine fracture. Electronically Signed   By: Macy Mis M.D.   On: 10/13/2021 12:23   CT HEAD WO CONTRAST  Result Date: 10/11/2021 CLINICAL DATA:  Mental status change, unknown cause EXAM: CT HEAD WITHOUT CONTRAST TECHNIQUE: Contiguous axial images were obtained from the base of the skull through the vertex without intravenous contrast. RADIATION DOSE REDUCTION: This exam was performed according to the departmental dose-optimization program which includes automated exposure control, adjustment of the mA and/or kV according to patient size and/or use of iterative reconstruction technique. COMPARISON:  03/16/2021 FINDINGS: Brain: There is no acute intracranial hemorrhage, mass effect, or edema. Gray-white differentiation is preserved. There is no extra-axial fluid collection. 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 Additional patchy hypoattenuation in the supratentorial white matter is nonspecific but probably  reflects chronic microvascular ischemic changes. Prominence of the ventricles and sulci reflects parenchymal volume loss.  These findings are similar to the prior study. Vascular: There is atherosclerotic calcification at the skull base. Skull: Calvarium is unremarkable. Sinuses/Orbits: No acute finding. Other: None. IMPRESSION: No evidence of acute intracranial injury. Chronic/nonemergent findings detailed above. Electronically Signed   By: Macy Mis M.D.   On: 10/27/2021 12:16   DG Chest Portable 1 View  Result Date: 10/02/2021 CLINICAL DATA:  Endotracheal tube advancement EXAM: PORTABLE CHEST 1 VIEW COMPARISON:  Portable exam 1136 hours compared to 10/01/2021 FINDINGS: Tip of endotracheal tube projects 4.6 cm above carina. Tip of nasogastric tube projects over distal esophagus, recommend advancing tube 12 cm to place proximal side-port within stomach. Normal heart size, mediastinal contours, and pulmonary vascularity. Bronchitic changes with patchy RIGHT lung infiltrates slightly increased versus earlier study. No pleural effusion or pneumothorax. IMPRESSION: Recommend advancing nasogastric tube 12 cm to place proximal side-port within stomach. Patchy RIGHT lung infiltrates, slightly increased. Electronically Signed   By: Lavonia Dana M.D.   On: 10/21/2021 11:51    Microbiology No results found for this or any previous visit (from the past 240 hour(s)).  Lab Basic Metabolic Panel: No results for input(s): "NA", "K", "CL", "CO2", "GLUCOSE", "BUN", "CREATININE", "CALCIUM", "MG", "PHOS" in the last 168 hours. Liver Function Tests: No results for input(s): "AST", "ALT", "ALKPHOS", "BILITOT", "PROT", "ALBUMIN" in the last 168 hours. No results for input(s): "LIPASE", "AMYLASE" in the last 168 hours. No results for input(s): "AMMONIA" in the last 168 hours. CBC: No results for input(s): "WBC", "NEUTROABS", "HGB", "HCT", "MCV", "PLT" in the last 168 hours. Cardiac Enzymes: No results for input(s): "CKTOTAL", "CKMB", "CKMBINDEX", "TROPONINI" in the last 168 hours. Sepsis Labs: No results for input(s): "PROCALCITON",  "WBC", "LATICACIDVEN" in the last 168 hours.  Procedures/Operations  10/12/2021 intubation 10/22/2021 central line placement   Maryjane Hurter 11/25/2021, 11:15 AM

## 2021-11-27 NOTE — Procedures (Signed)
Extubation Procedure Note  Patient Details:   Name: Daniel Chapman DOB: Dec 20, 1931 MRN: [id number]   Airway Documentation:    Vent end date: November 27, 2021 Vent end time: 1428   Evaluation  O2 sats: stable throughout Complications: No apparent complications Patient did tolerate procedure well. Bilateral Breath Sounds: Diminished   No  Pt was extubated for Comfort care.  YOUSUF AGER November 27, 2021, 2:31 PM

## 2021-11-27 NOTE — Progress Notes (Signed)
Palliative:    [name] is lying quietly in bed.  He is intubated/ventilated, on vasopressors.  He appears acutely/chronically ill and quite frail.  There is no family at bedside at this time.  Call to wife, Daniel Chapman.  Left somewhat generic voicemail message relating no urgency.  Call to daughter, Daniel Chapman.  Left somewhat generic voicemail message relating no urgency.  Family conference with wife, Daniel Chapman, daughters Daniel Chapman (granddaughter Daniel Chapman), Daniel Chapman, son Daniel Chapman in family waiting room.  We talk about Daniel Chapman acute illness.  We talked about compassionate extubation, what this would look like and feel like.  At this point family is requesting comfort and dignity at end-of-life, compassionate extubation to comfort care.  Verified no further medical interventions that are not focused on comfort and dignity, no further antibiotics/needlesticks, medicines that are not focused on comfort.    We talk about prognosis, anticipate hours to days, in hospital death.  Family states that there are no other family members who will be present, and they are ready for compassionate extubation at this time.  Conference with attending, bedside nursing staff, transition of care team related to patient condition, needs, goals of care, disposition. Compassionate extubation order placed.  End-of-life order set implemented.  Plan:   Extubation/comfort care.  Anticipate in-hospital death. Prognosis: Hours to days.  73 minutes   Daniel Axe, NP Palliative medicine team Team time [phone] Greater than 50% of this time was spent counseling and coordinating care related to the above assessment and plan.

## 2021-11-27 NOTE — TOC Progression Note (Signed)
Transition of Care [facility]) - Initial/Assessment Note    Patient Details  Name: Daniel Chapman MRN: [id number] Date of Birth: [birth year]  Transition of [facility]) CM/SW Contact:    Milinda Antis, [place] Phone Number: 11-25-2021, 12:53 PM  Clinical Narrative:                 [place] meeting scheduled for today with palliative.  TOC following patient for any d/c planning needs.  Lind Covert, MSW, LCSWA         Patient Goals and CMS Choice        Expected Discharge Plan and Services                                                Prior Living Arrangements/Services                       Activities of Daily Living      Permission Sought/Granted                  Emotional Assessment              Admission diagnosis:  AKI (acute kidney injury) ([place]) [N17.9] Unresponsiveness [R41.89] Sepsis ([place]) [A41.9] Severe sepsis ([place]) [A41.9, R65.20] Acute respiratory failure with hypoxia and hypercapnia ([place]) [J96.01, J96.02] Multifocal pneumonia [J18.9] Patient Active Problem List   Diagnosis Date Noted   Protein-calorie malnutrition, severe 10/27/2021   Sepsis ([place]) 09/28/2021   PCP:  Johna Roles, PA Pharmacy:   [facility] [address] [address] Phone: [phone] Fax: [phone]     Social Determinants of Health (SDOH) Interventions    Readmission Risk Interventions     View : No data to display.

## 2021-11-27 NOTE — Progress Notes (Signed)
Nutrition Brief Note  Chart reviewed and pt discussed with RN and during ICU rounds. Plans for one-way extubation today per RN. No further nutrition interventions planned at this time.  Please re-consult as needed.    Gustavus Bryant, MS, RD, LDN Inpatient Clinical Dietitian Please see AMiON for contact information.

## 2021-11-27 NOTE — Progress Notes (Signed)
Pharmacy note - antibiotic stewardship  Microbiology called to report that streptococcus parasanguinis is growing in one blood cultures - CCM already aware.  After chart review, the patient's antibiotics have been stopped with a focus on comfort measures only.  No further action to be taken at this point. Heide Guile, PharmD, [place] Clinical Pharmacist Phone [phone]

## 2021-11-27 NOTE — Progress Notes (Signed)
NAME:  Daniel Chapman, MRN:  [id number], DOB:  [birth year], LOS: 3 ADMISSION DATE:  10/17/2021 CONSULTATION DATE:  10/04/2021 REFERRING MD:  Doren Custard - EDP CHIEF COMPLAINT:  Unresponsive, c/f sepsis, pneumonia  History of Present Illness:  88 year old man who presented to [facility] ED 5/30 via EMS after being found down at home by his wife. GCS 3 on EMS arrival and agonal breathing, but had a pulse. "Warm to touch" per EMS. PMHx significant for Afib (on Eliquis), PAD, COPD, tobacco abuse, laryngeal CA (s/p radiation), chronic aspiration, AAA, cerebellar stroke, vascular dementia.  Per patient's wife and daughter at bedside, patient generally weak and slept most of the day 5/27.  On 5/28, patient reportedly fell at home and hit his head (of note, on Eliquis).  On 5/29, patient seemed "out of it" most of the day and was found unresponsive in bed this morning 5/30.  Family notes history of chronic aspiration after treatment for laryngeal cancer; he had a poor appetite compared to usual but was able to drink some over the weekend.  On ED arrival, temp 97.1, tachycardic to 110s, tachypneic to 27, SBP 140s. SpO2 88% with BVM. GCS 3. Intubated on ED arrival. CXR with patchy RUL/RLL opacities. CT Head with NAICA, chronic cerebellar infarct. CT C-Spine negative for fracture. CT Chest/A/P demonstrating bilateral multilobar airspace disease (R>L), likely r/t aspiration; no acute abdominal/pelvic findings. Labs notable for ABG 7.120/58/436/19, WBC 13.6, stable H&H. LA > 9.0. UA with > 500 glucose, 100 protein, otherwise unremarkable.  Pertinent Medical History:  Afib (on Eliquis), PAD, COPD, tobacco abuse, laryngeal CA (s/p radiation), chronic aspiration, AAA, cerebellar stroke, dementia  Significant Hospital Events: Including procedures, antibiotic start and stop dates in addition to other pertinent events   5/30 - Presented to [facility] ED via EMS after being found unresponsive in bed by wife. GCS 3. Maintained pulse  throughout, required BVM ventilation. Intubated on arrival to ED. Broad spectrum antibiotics initiated. CT Head/Cspine/A/P without acute findings. CT Chest with bilateral multilobar pneumonia, R > L. Admitted to ICU.  Interim History / Subjective:  Stable vent settings, minimal pressor requirement, net positive fluid balance.   Objective:  Blood pressure 97/67, pulse 85, temperature 98.2 F (36.8 C), temperature source Axillary, resp. rate (!) 28, height '5\' 7"'$  (1.702 m), weight 57.2 kg, SpO2 96 %. CVP:  [12 mmHg] 12 mmHg  Vent Mode: PRVC FiO2 (%):  [40 %-50 %] 50 % Set Rate:  [28 bmp] 28 bmp Vt Set:  [520 mL] 520 mL PEEP:  [10 cmH20] 10 cmH20 Plateau Pressure:  [21 cmH20-24 cmH20] 23 cmH20   Intake/Output Summary (Last 24 hours) at 11/13/21 1015 Last data filed at 13-Nov-2021 0900 Gross per 24 hour  Intake 1859.61 ml  Output 750 ml  Net 1109.61 ml   Filed Weights   10/06/2021 1224 10/23/2021 1251 10/27/21 0630  Weight: 51 kg 58 kg 57.2 kg   Physical Examination: General: frail older man, intubated, sedated Neuro: RASS -1, globally weak, not following commands. Weak cough. CV: mildly tachycardic, irreg rhythm PULM: faint rhonchi bl. Equal chest rise GI: thin, soft, hypoactive bs Extremities: trace peripheral edema, no cyanosis Skin: pallor, warm, dry  No new labs, imaging  BCx with strep parasanguinis  Resolved Hospital Problem List:    Assessment & Plan:  Septic shock likely secondary to multilobar pneumonia, strep parasanguinis BSI Lactic acidosis -Con't ceftriaxone  -Follow cultures -NE to maintain MAP >65, weaning off  Acute hypoxemic and hypercarbic respiratory failure secondary to multilobar pneumonia  Aspiration pneumonia suspected COPD with acute exacerbation, present on admission -LTVV -wean FiO2 and PEEP to maintain SpO2 >90% -VAP prevention protocol -PAD protocol for sedation -con't steroids -yupelri and brovana -daily SAT & SBT as tolerated; need to wean  PEEP more but then can start SBTs -ensure net negative fluid balance  Acute metabolic encephalopathy due to sepsis Vascular dementia -supportive care -PAD protocol  LUL cavitation, multiple lung nodules - would need OP follow up CT in 1-2 months  Chronic Atrial fibrillation Elevated troponin, suspect demand ischemia Acute RV failure; no PE. Suspect this is related to acute respiratory failure. Longstanding Afib, on Eliquis. CHA2DS2-VASc score 5. Mildly elevated troponin on admission. -con't holding Eliquis with low platelets; would favor resuming heparin when ready to resume AC -con't supportive care -not needing rate control meds currently despite norepinephrine  AKI secondary to sepsis, back to recent baseline Cr ~1.3. Oliguria treated with IVF overnight. -strict I/O -renally dose meds, avoid nephrotoxic meds -monitor -foley removed 5/31  Hyperkalemia, iatrogenic from repletion -previously received lokelma -lasix today will help -monitor  Hyperbilirubinemia and elevated transaminases due to sepsis, resolved -No additional monitoring required  History of laryngeal CA, s/p radiation with chronic aspiration Severe protein energy malnutrition Physical deconditioning Vascular dementia - Will require aggressive rehabilitation and has likely very limited recovery potential due to severe baseline debility and malnutrition. Family is aware of his guarded prognosis.   Best Practice: (right click and "Reselect all SmartList Selections" daily)   Diet/type: tubefeeds DVT prophylaxis: SCDs GI prophylaxis: PPI Lines: Central line and yes and it is still needed Foley:  N/A Code Status:  DNR Last date of multidisciplinary goals of care discussion [5/31, will update family later today when all have arrived]  This patient is critically ill with multiple organ system failure which requires frequent high complexity decision making, assessment, support, evaluation, and titration of  therapies. This was completed through the application of advanced monitoring technologies and extensive interpretation of multiple databases. During this encounter critical care time was devoted to patient care services described in this note for 35 minutes.  Maryjane Hurter, MD 11-04-21 10:15 AM [facility]

## 2021-11-27 DEATH — deceased

## 2021-12-07 ENCOUNTER — Ambulatory Visit: Payer: Medicare Other | Admitting: Podiatry

## 2022-10-21 IMAGING — RF DG ESOPHAGUS
6 series · 14 of 21 positions shown · non-contrast
Comparison: CT of the chest from 8952.  Swallow function from 8952.

CLINICAL DATA: A male at age 89 presents for evaluation of
dysphagia.

EXAM:
ESOPHOGRAM/BARIUM SWALLOW
TECHNIQUE: Single contrast examination was performed using  thin barium.
FLUOROSCOPY:
Radiation Exposure Index (as provided by the fluoroscopic device):
5.7 mGy Kerma

[Series 1: one shot · 0.14mm/px · 1 of 1 slices shown (1 of 2)]
[im 1/1]
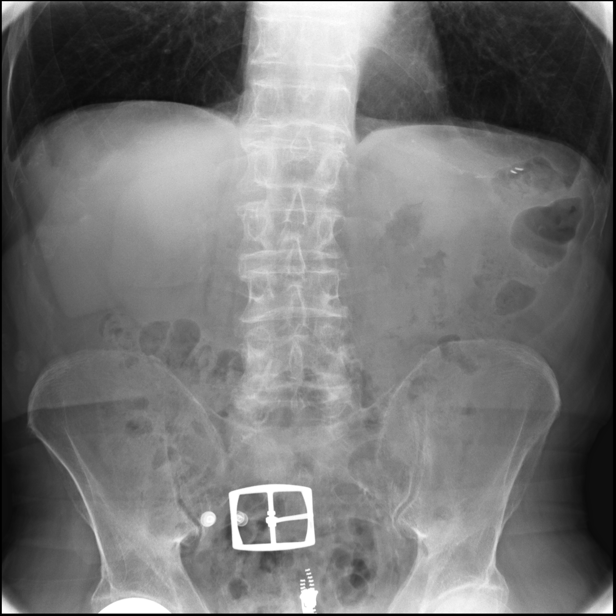

[Series 2: sequence · 2 of 27 frames shown (1 of 4)]
[frame 5/27]
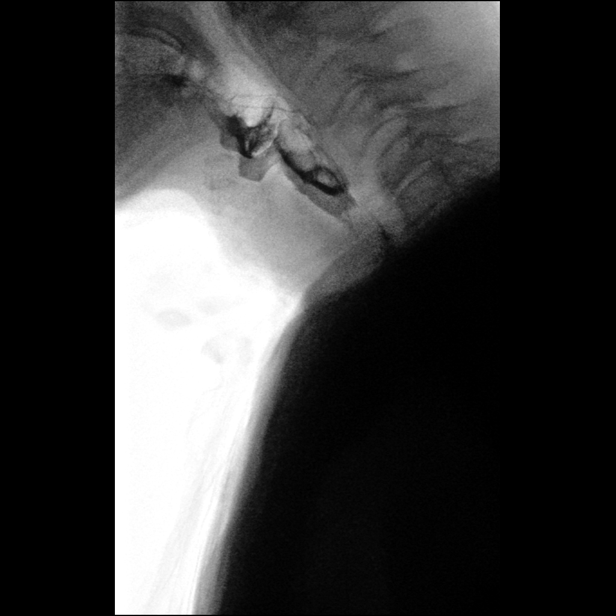
[frame 14/27]
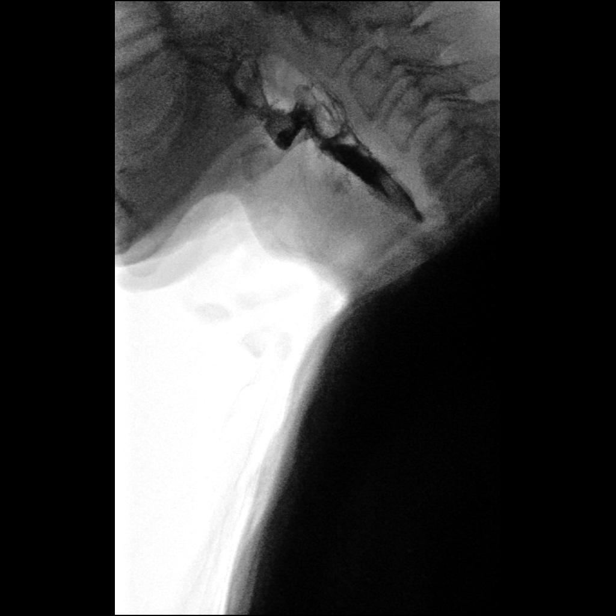

[Series 3: sequence · 3 of 13 frames shown (2 of 4)]
[frame 2/13]
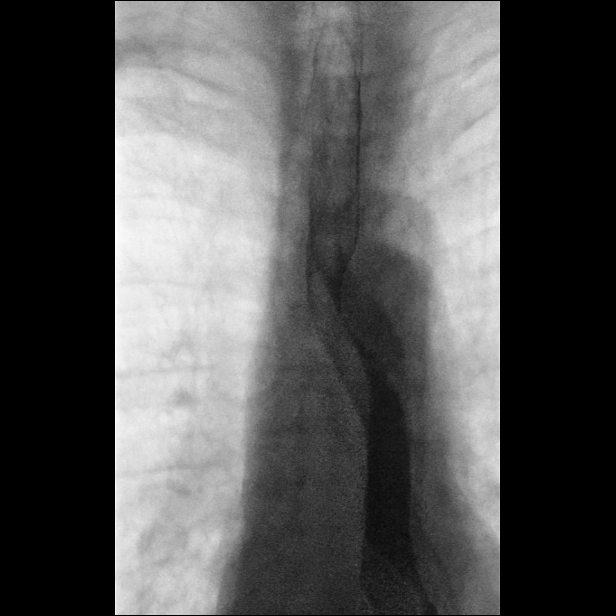
[frame 5/13]
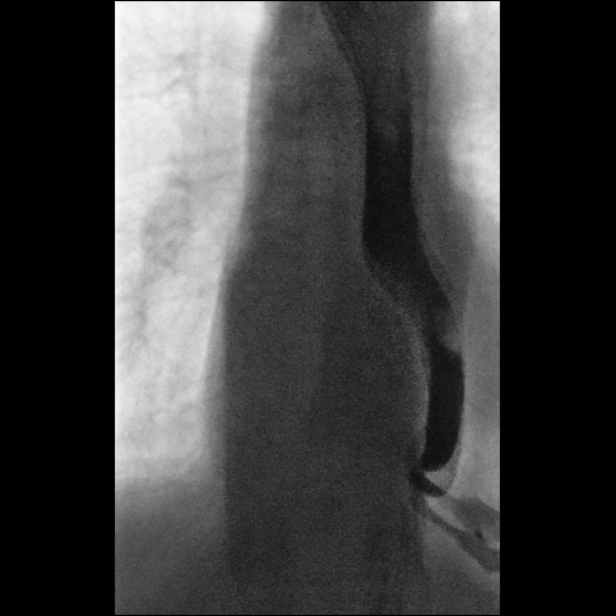
[frame 12/13]
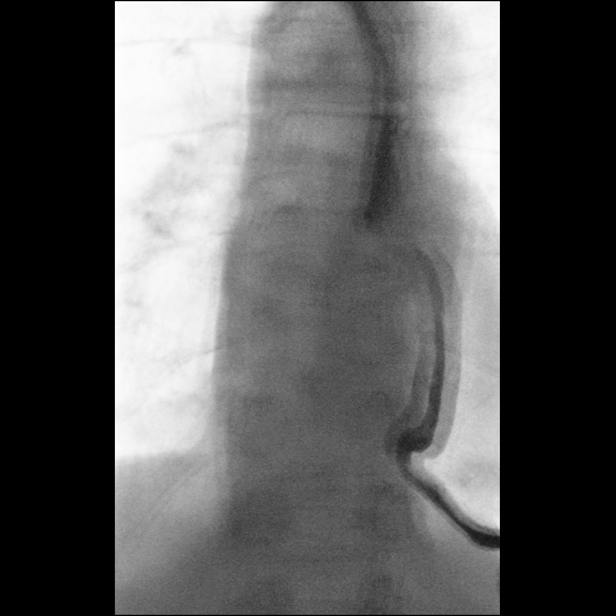

[Series 4: sequence · 3 of 35 frames shown (3 of 4)]
[frame 6/35]
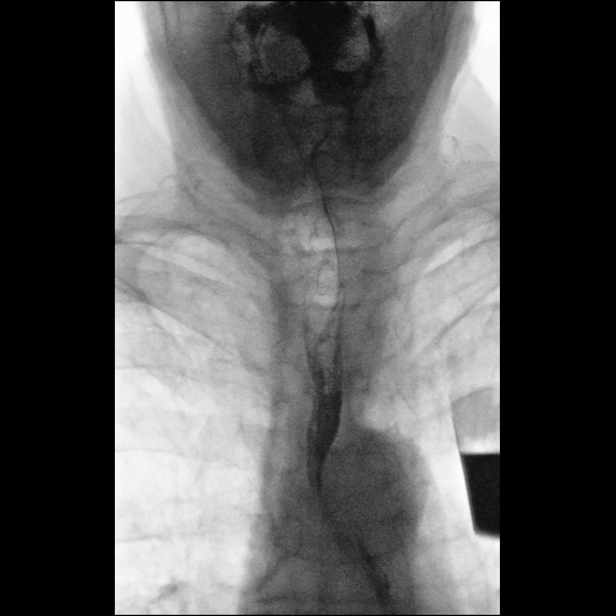
[frame 30/35]
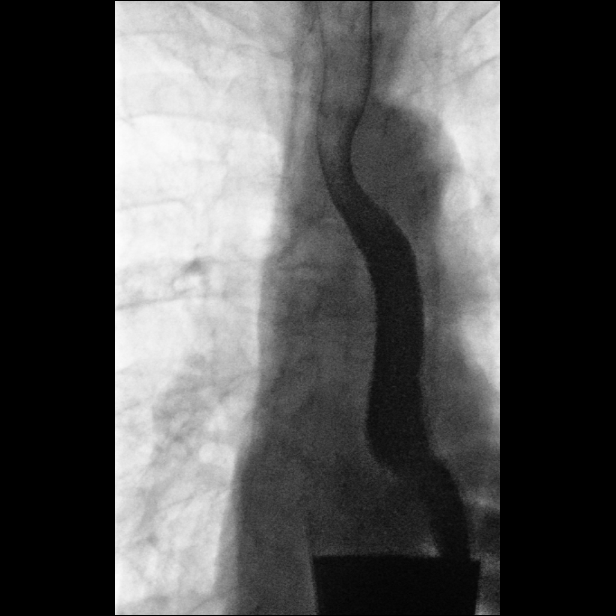
[frame 34/35]
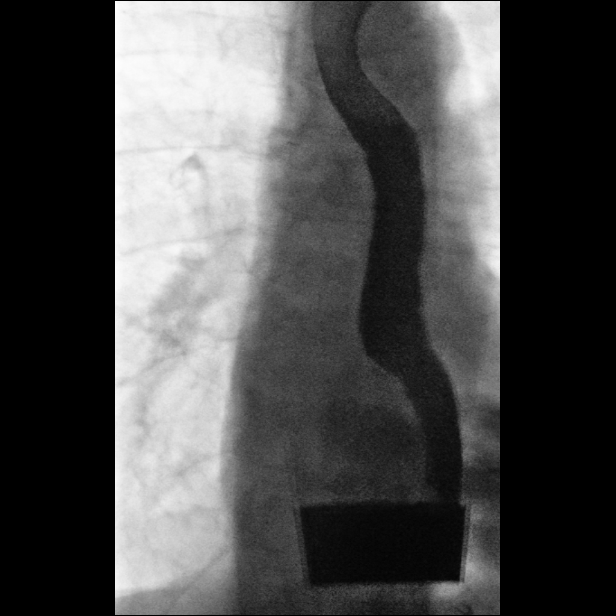

[Series 5: sequence · 2 of 5 frames shown (4 of 4)]
[frame 3/5]
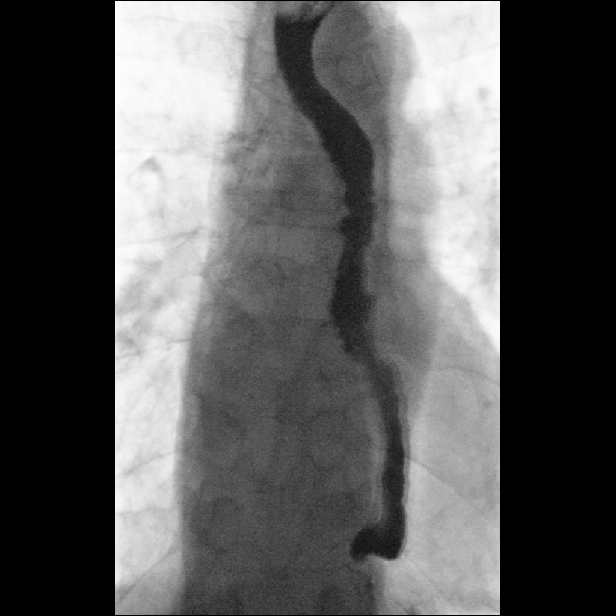
[frame 5/5]
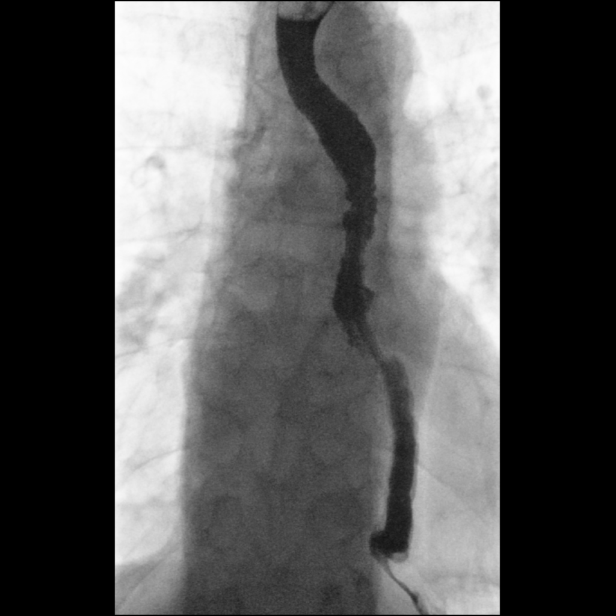

[Series 6: one shot · 0.16mm/px · 3 of 5 slices shown (2 of 2)]
[im 2/5]
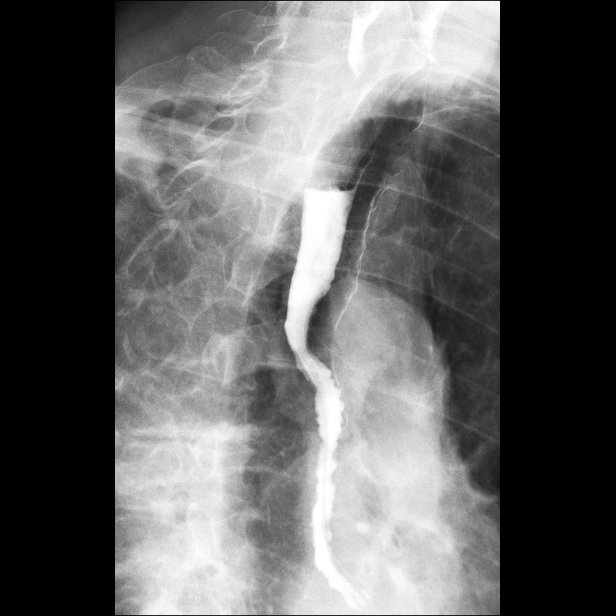
[im 3/5]
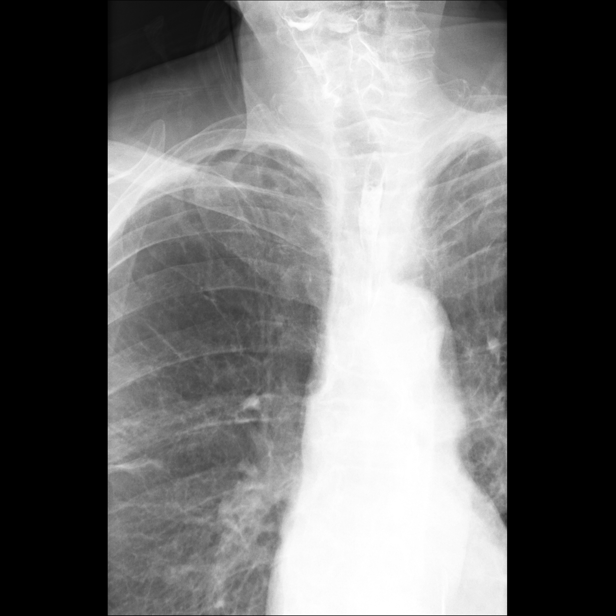
[im 5/5]
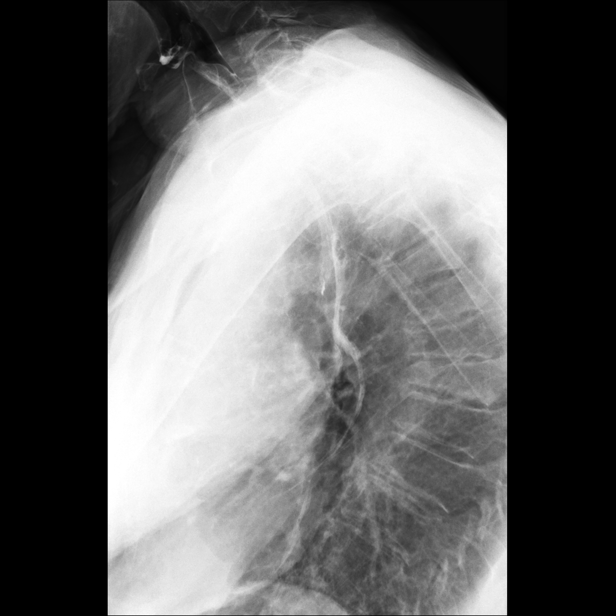

[14 of 21 positions shown; findings below may reference images not displayed]

FINDINGS: Scout film of the abdomen without acute abdominal findings and
spinal degenerative changes. Swallowing was first performed in the
lateral projection showing no signs of aspiration with laryngeal
penetration. Blunting of the epiglottis may relate to therapy from
reported history of laryngeal cancer.

AP swallow also performed, during the swallow there was some cough
exhibited. The patient was moved into LPO position and a small
amount of barium could be seen outlining the anterior trachea. The
patient was encouraged to cough to clear but head weak cough. He
states he has cough after meals in during meals on occasion.

There was tertiary peristaltic activity very limited assessment of
the esophagus. The examination was terminated at this point due to
aspiration risk.
IMPRESSION: 1. Findings of aspiration discovered during the upper GI, initial
portion of the exam. There was cough following this event and the
study was subsequently terminated.
2. Suggest dedicated speech pathology assessment.
3. Suspect esophageal dysmotility.  Esophagus not well assessed.
4. This was discussed with the patient's wife at the time of the
study. Given the history of aspiration if the patient develops fever
or worsening cough she was instructed to contact her husband's
primary care physician.
